# Patient Record
Sex: Female | Born: 1960
Health system: Southern US, Community
[De-identification: ages and names within clinical notes are randomized; demographics above are authoritative.]

## PROBLEM LIST (undated history)

## (undated) DIAGNOSIS — B379 Candidiasis, unspecified: Secondary | ICD-10-CM

## (undated) DIAGNOSIS — J42 Unspecified chronic bronchitis: Secondary | ICD-10-CM

## (undated) DIAGNOSIS — K219 Gastro-esophageal reflux disease without esophagitis: Secondary | ICD-10-CM

## (undated) DIAGNOSIS — R42 Dizziness and giddiness: Secondary | ICD-10-CM

## (undated) DIAGNOSIS — R7989 Other specified abnormal findings of blood chemistry: Secondary | ICD-10-CM

## (undated) DIAGNOSIS — E669 Obesity, unspecified: Secondary | ICD-10-CM

## (undated) DIAGNOSIS — K8020 Calculus of gallbladder without cholecystitis without obstruction: Secondary | ICD-10-CM

## (undated) DIAGNOSIS — E119 Type 2 diabetes mellitus without complications: Secondary | ICD-10-CM

## (undated) DIAGNOSIS — C801 Malignant (primary) neoplasm, unspecified: Secondary | ICD-10-CM

## (undated) DIAGNOSIS — R109 Unspecified abdominal pain: Secondary | ICD-10-CM

## (undated) DIAGNOSIS — N3941 Urge incontinence: Secondary | ICD-10-CM

## (undated) DIAGNOSIS — N840 Polyp of corpus uteri: Secondary | ICD-10-CM

## (undated) DIAGNOSIS — F419 Anxiety disorder, unspecified: Secondary | ICD-10-CM

## (undated) DIAGNOSIS — R61 Generalized hyperhidrosis: Secondary | ICD-10-CM

## (undated) DIAGNOSIS — Z79890 Hormone replacement therapy (postmenopausal): Secondary | ICD-10-CM

## (undated) DIAGNOSIS — A64 Unspecified sexually transmitted disease: Secondary | ICD-10-CM

## (undated) DIAGNOSIS — I10 Essential (primary) hypertension: Secondary | ICD-10-CM

## (undated) HISTORY — DX: Polyp of corpus uteri: N84.0

## (undated) HISTORY — DX: Anxiety disorder, unspecified: F41.9

## (undated) HISTORY — PX: MOLE REMOVAL: SHX2046

## (undated) HISTORY — DX: Generalized hyperhidrosis: R61

## (undated) HISTORY — PX: FOOT SURGERY: SHX648

## (undated) HISTORY — DX: Unspecified sexually transmitted disease: A64

## (undated) HISTORY — DX: Essential (primary) hypertension: I10

## (undated) HISTORY — DX: Other specified abnormal findings of blood chemistry: R79.89

## (undated) HISTORY — DX: Obesity, unspecified: E66.9

## (undated) HISTORY — PX: WISDOM TOOTH EXTRACTION: SHX21

## (undated) HISTORY — PX: CHOLECYSTECTOMY: SHX55

## (undated) HISTORY — DX: Unspecified abdominal pain: R10.9

## (undated) HISTORY — DX: Unspecified chronic bronchitis: J42

## (undated) HISTORY — DX: Calculus of gallbladder without cholecystitis without obstruction: K80.20

## (undated) HISTORY — PX: COLONOSCOPY: SHX174

## (undated) HISTORY — DX: Hormone replacement therapy: Z79.890

## (undated) HISTORY — DX: Candidiasis, unspecified: B37.9

## (undated) HISTORY — DX: Dizziness and giddiness: R42

## (undated) HISTORY — DX: Urge incontinence: N39.41

## (undated) HISTORY — PX: ABDOMINAL HYSTERECTOMY: SHX81

---

## 2004-12-08 ENCOUNTER — Ambulatory Visit (HOSPITAL_COMMUNITY): Admission: RE | Admit: 2004-12-08 | Discharge: 2004-12-08 | Payer: Self-pay | Admitting: Family Medicine

## 2007-10-25 ENCOUNTER — Other Ambulatory Visit: Admission: RE | Admit: 2007-10-25 | Discharge: 2007-10-25 | Payer: Self-pay | Admitting: Obstetrics and Gynecology

## 2008-12-04 ENCOUNTER — Other Ambulatory Visit: Admission: RE | Admit: 2008-12-04 | Discharge: 2008-12-04 | Payer: Self-pay | Admitting: Obstetrics and Gynecology

## 2010-03-24 ENCOUNTER — Other Ambulatory Visit: Admission: RE | Admit: 2010-03-24 | Discharge: 2010-03-24 | Payer: Self-pay | Admitting: Obstetrics and Gynecology

## 2011-08-10 ENCOUNTER — Other Ambulatory Visit (HOSPITAL_COMMUNITY)
Admission: RE | Admit: 2011-08-10 | Discharge: 2011-08-10 | Disposition: A | Discharge status: Home / Self Care | Payer: Federal, State, Local not specified - PPO | Source: Ambulatory Visit | Attending: Obstetrics & Gynecology | Admitting: Obstetrics & Gynecology

## 2011-08-10 DIAGNOSIS — Z113 Encounter for screening for infections with a predominantly sexual mode of transmission: Secondary | ICD-10-CM | POA: Insufficient documentation

## 2011-08-10 DIAGNOSIS — Z01419 Encounter for gynecological examination (general) (routine) without abnormal findings: Secondary | ICD-10-CM | POA: Insufficient documentation

## 2012-07-20 ENCOUNTER — Other Ambulatory Visit: Payer: Self-pay | Admitting: Women's Health

## 2012-08-15 ENCOUNTER — Other Ambulatory Visit (HOSPITAL_COMMUNITY)
Admission: RE | Admit: 2012-08-15 | Discharge: 2012-08-15 | Disposition: A | Discharge status: Home / Self Care | Payer: Federal, State, Local not specified - PPO | Source: Ambulatory Visit | Attending: Obstetrics and Gynecology | Admitting: Obstetrics and Gynecology

## 2012-08-15 DIAGNOSIS — Z1151 Encounter for screening for human papillomavirus (HPV): Secondary | ICD-10-CM | POA: Insufficient documentation

## 2012-08-15 DIAGNOSIS — Z01419 Encounter for gynecological examination (general) (routine) without abnormal findings: Secondary | ICD-10-CM | POA: Insufficient documentation

## 2013-03-25 ENCOUNTER — Telehealth: Payer: Self-pay | Admitting: Adult Health

## 2013-03-25 NOTE — Telephone Encounter (Signed)
Check labs fasting cbc,cmp,tsh lipids and fsh,complains of night sweats,cold, tired,not depressed per pt

## 2013-03-27 ENCOUNTER — Other Ambulatory Visit (INDEPENDENT_AMBULATORY_CARE_PROVIDER_SITE_OTHER): Payer: Federal, State, Local not specified - PPO

## 2013-03-27 DIAGNOSIS — N951 Menopausal and female climacteric states: Secondary | ICD-10-CM

## 2013-03-27 LAB — LIPID PANEL
Cholesterol: 163 mg/dL (ref 0–200)
LDL Cholesterol: 91 mg/dL (ref 0–99)
Total CHOL/HDL Ratio: 3.1 Ratio
VLDL: 20 mg/dL (ref 0–40)

## 2013-03-27 LAB — CBC
MCH: 32.9 pg (ref 26.0–34.0)
MCHC: 34.6 g/dL (ref 30.0–36.0)
Platelets: 306 10*3/uL (ref 150–400)
RBC: 4.29 MIL/uL (ref 3.87–5.11)
WBC: 7.5 10*3/uL (ref 4.0–10.5)

## 2013-03-27 LAB — COMPREHENSIVE METABOLIC PANEL
ALT: 13 U/L (ref 0–35)
AST: 13 U/L (ref 0–37)
CO2: 25 mEq/L (ref 19–32)
Chloride: 106 mEq/L (ref 96–112)
Creat: 0.83 mg/dL (ref 0.50–1.10)
Potassium: 4.3 mEq/L (ref 3.5–5.3)
Sodium: 138 mEq/L (ref 135–145)
Total Protein: 7.1 g/dL (ref 6.0–8.3)

## 2013-03-28 ENCOUNTER — Telehealth: Payer: Self-pay | Admitting: *Deleted

## 2013-03-28 NOTE — Telephone Encounter (Signed)
Pt requesting lab results from 03/27/2013, informed of all results CMP, TSH, CBC, lipid pain, FSH. Pt states Victorino Dike had mention pt being referred to Endocrinologist for progesterone levels would like to be referred to [facility]. Requesting Victorino Dike to give her a call back.

## 2013-03-29 ENCOUNTER — Telehealth: Payer: Self-pay | Admitting: Adult Health

## 2013-03-29 NOTE — Telephone Encounter (Signed)
Pt aware of labs and with her symptoms I would add estrogen to her progesterone but research it and we will talk next week

## 2013-03-29 NOTE — Telephone Encounter (Signed)
Pt to review estrogen therapy and call monday

## 2013-04-01 ENCOUNTER — Telehealth: Payer: Self-pay | Admitting: Adult Health

## 2013-04-02 ENCOUNTER — Telehealth: Payer: Self-pay | Admitting: Adult Health

## 2013-04-02 NOTE — Telephone Encounter (Signed)
Spoke with pt. Caroline Wong wants her to start Estrogen. Pt has done some research and would like this in a nasal spray if possible. If she can't have it in a nasal spray, she requests a patch. She don't want a pill. Uses [facility] in [place]. Please call pt. Thanks!!!

## 2013-04-02 NOTE — Telephone Encounter (Signed)
Will add estrogen patch come get sample

## 2013-04-15 ENCOUNTER — Telehealth: Payer: Self-pay | Admitting: Adult Health

## 2013-04-15 NOTE — Telephone Encounter (Signed)
Complains of weight gain and breast tenderness since starting estrogen, reassured. Give it a few weeks

## 2013-04-23 ENCOUNTER — Other Ambulatory Visit: Payer: Self-pay | Admitting: Adult Health

## 2013-04-23 MED ORDER — ESTRADIOL-NORETHINDRONE ACET 0.05-0.14 MG/DAY TD PTTW: 1.0000 | MEDICATED_PATCH | TRANSDERMAL | Status: DC

## 2013-04-23 NOTE — Telephone Encounter (Signed)
minivelle 0.1mg  patch   Progesterone and estrogen in same patch? If so she won't reorder progesterone. If you want to go ahead and give it that's fine.

## 2013-05-04 ENCOUNTER — Ambulatory Visit: Payer: Self-pay | Admitting: Physician Assistant

## 2013-05-16 ENCOUNTER — Ambulatory Visit (INDEPENDENT_AMBULATORY_CARE_PROVIDER_SITE_OTHER): Payer: Federal, State, Local not specified - PPO | Admitting: Internal Medicine

## 2013-05-16 ENCOUNTER — Encounter: Payer: Self-pay | Admitting: Internal Medicine

## 2013-05-16 VITALS — BP 136/90 | HR 75 | Temp 98.5°F | Ht 67.0 in | Wt 203.5 lb

## 2013-05-16 DIAGNOSIS — Z0000 Encounter for general adult medical examination without abnormal findings: Secondary | ICD-10-CM | POA: Insufficient documentation

## 2013-05-16 DIAGNOSIS — J42 Unspecified chronic bronchitis: Secondary | ICD-10-CM

## 2013-05-16 DIAGNOSIS — I10 Essential (primary) hypertension: Secondary | ICD-10-CM | POA: Insufficient documentation

## 2013-05-16 DIAGNOSIS — J019 Acute sinusitis, unspecified: Secondary | ICD-10-CM | POA: Insufficient documentation

## 2013-05-16 MED ORDER — HYDROCODONE-HOMATROPINE 5-1.5 MG/5ML PO SYRP: 5.0000 mL | ORAL_SOLUTION | Freq: Four times a day (QID) | ORAL | Status: DC | PRN

## 2013-05-16 MED ORDER — LEVOFLOXACIN 500 MG PO TABS: 500.0000 mg | ORAL_TABLET | Freq: Every day | ORAL | Status: DC

## 2013-05-16 NOTE — Progress Notes (Signed)
Pre-visit discussion using our clinic review tool. No additional management support is needed unless otherwise documented below in the visit note.  

## 2013-05-16 NOTE — Assessment & Plan Note (Signed)
Mild to mod, for antibx course,  to f/u any worsening symptoms or concerns 

## 2013-05-16 NOTE — Assessment & Plan Note (Signed)
stable overall by history and exam, recent data reviewed with pt, and pt to continue medical treatment as before,  to f/u any worsening symptoms or concerns BP Readings from Last 3 Encounters:  05/16/13 136/90

## 2013-05-16 NOTE — Patient Instructions (Signed)
Please take all new medication as prescribed Please continue all other medications as before Please have the pharmacy call with any other refills you may need.  Please keep your appointments with your specialists as you have planned  -  GYN  Please return in 6 months, or sooner if needed, with Lab testing done 3-5 days before

## 2013-05-16 NOTE — Progress Notes (Signed)
   Subjective:    Patient ID: Caroline Wong, female    DOB: 03/14/1961, 52 y.o.   MRN: [id number]  HPI Here as new pt to establish,  Here with 2-3 days acute onset fever, facial pain, pressure, headache, general weakness and malaise, and greenish d/c, with mild ST and cough, but pt denies chest pain, wheezing, increased sob or doe, orthopnea, PND, increased LE swelling, palpitations, dizziness or syncope.  Pt denies polydipsia, polyuria. Pt denies new neurological symptoms such as new headache, or facial or extremity weakness or numbness Past Medical History  Diagnosis Date  . Chronic bronchitis   . Hypertension    No past surgical history on file.  reports that she has never smoked. She does not have any smokeless tobacco history on file. She reports that she does not drink alcohol or use illicit drugs. family history includes Alcohol abuse in her other; Arthritis in her other; Cancer in her other and other; Diabetes in her other and other; Heart disease in her other and other; Hyperlipidemia in her other; Hypertension in her other and other; Stroke in her other. Allergies  Allergen Reactions  . Biaxin [Clarithromycin] Swelling  . Keflex [Cephalexin] Rash   Current Outpatient Prescriptions on File Prior to Visit  Medication Sig Dispense Refill  . estradiol-norethindrone (COMBIPATCH) 0.05-0.14 MG/DAY Place 1 patch onto the skin 2 (two) times a week.  8 patch  12   No current facility-administered medications on file prior to visit.   Review of Systems  Constitutional: Negative for unexpected weight change, or unusual diaphoresis  HENT: Negative for tinnitus.   Eyes: Negative for photophobia and visual disturbance.  Respiratory: Negative for choking and stridor.   Gastrointestinal: Negative for vomiting and blood in stool.  Genitourinary: Negative for hematuria and decreased urine volume.  Musculoskeletal: Negative for acute joint swelling Skin: Negative for color change and wound.    Neurological: Negative for tremors and numbness other than noted  Psychiatric/Behavioral: Negative for decreased concentration or  hyperactivity.       Objective:   Physical Exam VS noted, mild ill Constitutional: Pt appears well-developed and well-nourished.  HENT: Head: NCAT.  Right Ear: External ear normal.  Left Ear: External ear normal.  Eyes: Conjunctivae and EOM are normal. Pupils are equal, round, and reactive to light.  Bilat tm's with mild erythema.  Max sinus areas mild tender.  Pharynx with mild erythema, no exudate Neck: Normal range of motion. Neck supple.  Cardiovascular: Normal rate and regular rhythm.   Pulmonary/Chest: Effort normal and breath sounds normal.  Abd:  Soft, NT, non-distended, + BS Neurological: Pt is alert. Not confused  Skin: Skin is warm. No erythema.  Psychiatric: Pt behavior is normal. Thought content normal.         Assessment & Plan:

## 2013-05-16 NOTE — Assessment & Plan Note (Signed)
Mild to mod,,  to f/u any worsening symptoms or concerns

## 2013-05-17 ENCOUNTER — Ambulatory Visit: Payer: Federal, State, Local not specified - PPO | Admitting: Internal Medicine

## 2013-05-17 ENCOUNTER — Other Ambulatory Visit: Payer: Self-pay

## 2013-05-17 MED ORDER — LEVOFLOXACIN 500 MG PO TABS: 500.0000 mg | ORAL_TABLET | Freq: Every day | ORAL | Status: DC

## 2013-05-22 ENCOUNTER — Ambulatory Visit: Payer: Federal, State, Local not specified - PPO | Admitting: Internal Medicine

## 2013-06-06 HISTORY — PX: COLONOSCOPY: SHX174

## 2013-07-15 LAB — HM MAMMOGRAPHY

## 2013-08-13 ENCOUNTER — Other Ambulatory Visit: Payer: Self-pay | Admitting: Women's Health

## 2013-08-14 ENCOUNTER — Telehealth: Payer: Self-pay

## 2013-08-14 MED ORDER — AZITHROMYCIN 250 MG PO TABS: ORAL_TABLET | ORAL | Status: DC

## 2013-08-14 NOTE — Telephone Encounter (Signed)
The patient thinks she may have bronchitis.  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  She is asking this one time if a zpack can be called in to [facility] Dr. Linna Hoff.  Advise please, number to call back [phone]

## 2013-08-14 NOTE — Telephone Encounter (Signed)
Patient informed. 

## 2013-08-14 NOTE — Telephone Encounter (Signed)
Ok this time only  Needs ov if not improved

## 2013-08-16 ENCOUNTER — Other Ambulatory Visit: Payer: Self-pay | Admitting: Adult Health

## 2013-08-21 ENCOUNTER — Ambulatory Visit (INDEPENDENT_AMBULATORY_CARE_PROVIDER_SITE_OTHER): Payer: Federal, State, Local not specified - PPO | Admitting: Adult Health

## 2013-08-21 ENCOUNTER — Encounter (INDEPENDENT_AMBULATORY_CARE_PROVIDER_SITE_OTHER): Payer: Self-pay

## 2013-08-21 ENCOUNTER — Encounter: Payer: Self-pay | Admitting: Adult Health

## 2013-08-21 VITALS — BP 150/98 | HR 76 | Ht 68.0 in | Wt 209.0 lb

## 2013-08-21 DIAGNOSIS — Z79890 Hormone replacement therapy (postmenopausal): Secondary | ICD-10-CM

## 2013-08-21 DIAGNOSIS — Z01419 Encounter for gynecological examination (general) (routine) without abnormal findings: Secondary | ICD-10-CM

## 2013-08-21 DIAGNOSIS — Z1212 Encounter for screening for malignant neoplasm of rectum: Secondary | ICD-10-CM

## 2013-08-21 LAB — HEMOCCULT GUIAC POC 1CARD (OFFICE): Fecal Occult Blood, POC: NEGATIVE

## 2013-08-21 MED ORDER — VALACYCLOVIR HCL 1 G PO TABS: ORAL_TABLET | ORAL | Status: DC

## 2013-08-21 NOTE — Progress Notes (Signed)
Patient ID: Caroline Wong, female   DOB: [birth year], 53 y.o.   MRN: [id number] History of Present Illness:  Caroline Wong is a 53 year old white female, married in for a physical.She had a normal pap with negative HPV 08/15/12.She has had bronchitis recently.she is using combi patch and likes it but has some itching at application site.  Current Medications, Allergies, Past Medical History, Past Surgical History, Family History and Social History were reviewed in [employer] record.   Past Medical History  Diagnosis Date  . Chronic bronchitis   . Hypertension   . Hormone replacement therapy (HRT)    Past Surgical History  Procedure Laterality Date  . Wisdom tooth extraction    . Mole removal    Current outpatient prescriptions:amLODipine-benazepril (LOTREL) 5-20 MG per capsule, Take 1 capsule by mouth daily. , Disp: , Rfl: ;  b complex vitamins tablet, Take 1 tablet by mouth daily., Disp: , Rfl: ;  estradiol-norethindrone (COMBIPATCH) 0.05-0.14 MG/DAY, Place 1 patch onto the skin 2 (two) times a week., Disp: 8 patch, Rfl: 12;  LORazepam (ATIVAN) 0.5 MG tablet, Take 0.5 mg by mouth every 8 (eight) hours., Disp: , Rfl:  metoprolol succinate (TOPROL-XL) 25 MG 24 hr tablet, , Disp: , Rfl: ;  NASONEX 50 MCG/ACT nasal spray, Place 2 sprays into the nose daily. , Disp: , Rfl: ;  Probiotic Product (PROBIOTIC DAILY PO), Take by mouth daily., Disp: , Rfl: ;  valACYclovir (VALTREX) 1000 MG tablet, take 1 tablet by mouth once daily, Disp: 30 tablet, Rfl: PRN  Review of Systems: Patient denies any headaches, blurred vision, shortness of breath, chest pain, abdominal pain, problems with bowel movements, urination, or intercourse. No joint pain or mood swings,see HPI.    Physical Exam:BP 150/98  Pulse 76  Ht 5\' 8"  (1.727 m)  Wt 209 lb (94.802 kg)  BMI 31.79 kg/m2  LMP 08/06/2013 General:  Well developed, well nourished, no acute distress Skin:  Warm and dry Neck:  Midline trachea,  normal thyroid Lungs; Clear to auscultation bilaterally Breast:  No dominant palpable mass, retraction, or nipple discharge Cardiovascular: Regular rate and rhythm Abdomen:  Soft, non tender, no hepatosplenomegaly Pelvic:  External genitalia is normal in appearance.  The vagina is normal in appearance. The cervix is bulbous.  Uterus is felt to be normal size, shape, and contour.  No adnexal masses or tenderness noted. Rectal: Good sphincter tone, no polyps, or hemorrhoids felt.  Hemoccult negative. Extremities:  No swelling or varicosities noted Psych:  No mood changes, alert and cooperative, seems happy Try wiping spot with alcohol and let dry before putting patch on.  Impression: Yearly gyn exam no pap HRT    Plan: Physical in 1 year Pap in 2017 Mammogram yearly  Labs with PCP Colonoscopy advised, will get PCP to refer Refilled combipatch x 1 year Refilled valtrex x 1 year

## 2013-08-21 NOTE — Patient Instructions (Signed)

## 2013-09-09 ENCOUNTER — Telehealth: Payer: Self-pay | Admitting: Adult Health

## 2013-09-09 MED ORDER — PROGESTERONE MICRONIZED 200 MG PO CAPS
ORAL_CAPSULE | ORAL | Status: DC
Start: 2013-09-09 — End: 2014-08-29

## 2013-09-09 MED ORDER — ESTRADIOL 0.52 MG/0.87 GM (0.06%) TD GEL: 1.0000 "application " | Freq: Every day | TRANSDERMAL | Status: DC

## 2013-09-09 NOTE — Telephone Encounter (Signed)
Had BTB with patch and it is irritating and not sticking well will change to Prometrium and elestrin gel

## 2013-09-11 ENCOUNTER — Encounter: Payer: Self-pay | Admitting: Adult Health

## 2013-09-18 ENCOUNTER — Ambulatory Visit: Payer: Federal, State, Local not specified - PPO | Admitting: Podiatry

## 2013-09-18 ENCOUNTER — Ambulatory Visit (INDEPENDENT_AMBULATORY_CARE_PROVIDER_SITE_OTHER): Payer: Federal, State, Local not specified - PPO

## 2013-09-18 ENCOUNTER — Ambulatory Visit (INDEPENDENT_AMBULATORY_CARE_PROVIDER_SITE_OTHER): Payer: Federal, State, Local not specified - PPO | Admitting: Podiatry

## 2013-09-18 ENCOUNTER — Encounter: Payer: Self-pay | Admitting: Podiatry

## 2013-09-18 DIAGNOSIS — R52 Pain, unspecified: Secondary | ICD-10-CM

## 2013-09-18 DIAGNOSIS — D492 Neoplasm of unspecified behavior of bone, soft tissue, and skin: Secondary | ICD-10-CM

## 2013-09-18 DIAGNOSIS — M722 Plantar fascial fibromatosis: Secondary | ICD-10-CM

## 2013-09-18 MED ORDER — TRIAMCINOLONE ACETONIDE 10 MG/ML IJ SUSP
10.0000 mg | Freq: Once | INTRAMUSCULAR | Status: AC
  Administered 2013-09-18: 10 mg

## 2013-09-18 NOTE — Progress Notes (Signed)
Subjective:     Patient ID: Caroline Wong, female   DOB: 10/10/1960, 53 y.o.   MRN: [id number]  Foot Pain   patient states the bottom of my feet it did really sore or while have worsened over the last 3 weeks with nodules that I've noted plantar aspect left where the pain is present. Does not remember specific injury and presents with husband   Review of Systems  All other systems reviewed and are negative.      Objective:   Physical Exam  Nursing note and vitals reviewed. Constitutional: She is oriented to person, place, and time.  Cardiovascular: Intact distal pulses.   Musculoskeletal: Normal range of motion.  Neurological: She is oriented to person, place, and time.  Skin: Skin is warm.   neurovascular status intact with health history unchanged and normal muscle strength and normal range of motion the subtalar midtarsal joint. Moderate forefoot pain in both feet and significant discomfort in the left arch where there is approximately 1 cm x 1 cm palpable mass which appears to be within the plantar fascia. Small mass also noted in the right foot same area     Assessment:     Probable plantar fibroma or possible cyst formation of an acute nature with plantar fasciitis    Plan:     H&P and x-ray reviewed and today I did careful injection around the area 3 mm Kenalog 5 of abduction Marcaine mixture to try to reduce the inflammation. Dispense fascial brace to lift the arch and instructed on physical therapy and reappoint one week and discuss long-term orthotics treatment for this patient

## 2013-09-18 NOTE — Patient Instructions (Signed)

## 2013-09-18 NOTE — Progress Notes (Signed)
   Subjective:    Patient ID: Caroline Wong, female    DOB: May 15, 1961, 53 y.o.   MRN: [id number]  HPI PT STATED BOTH FEET HAVING BURNING SENSATION AND SORE FOR 3 WEEKS. THE FEET ARE GETTING WORSE. THE FEET GET AGGRAVATED BY WALKING AND PRESSURE. TRIED TO USED INSERT BUT THE PAIN IS GETTING WORSE.    Review of Systems  All other systems reviewed and are negative.      Objective:   Physical Exam        Assessment & Plan:

## 2013-09-26 ENCOUNTER — Encounter: Payer: Self-pay | Admitting: Podiatry

## 2013-09-26 ENCOUNTER — Ambulatory Visit (INDEPENDENT_AMBULATORY_CARE_PROVIDER_SITE_OTHER): Payer: Federal, State, Local not specified - PPO | Admitting: Podiatry

## 2013-09-26 VITALS — BP 120/75 | HR 68 | Resp 18

## 2013-09-26 DIAGNOSIS — M722 Plantar fascial fibromatosis: Secondary | ICD-10-CM

## 2013-09-26 NOTE — Progress Notes (Signed)
Subjective:     Patient ID: Caroline Wong, female   DOB: 1960-07-21, 53 y.o.   MRN: [id number]  HPI patient states my feet are improved over where they were I think the nodules are smaller and the pain has receded. I am on cement floors and I still get pain with activity   Review of Systems     Objective:   Physical Exam Neurovascular status intact with diminishment of what appeared to be plantar fibroma or cyst in the mid arch area both feet. They are smaller in origin about 5 x 5 mm and there is mild to moderate discomfort within the fascially band itself    Assessment:     Appears to be improving but does have chronic fasciitis-like symptoms    Plan:     I advised her on continued stretching activities and I did dispense a night splint with instructions on heat and ice therapy. I then scanned for custom orthotics to reduce stress against her arch as secondary to her intense activity levels and the fact she's on cement floors all times reappoint when ready

## 2013-10-14 ENCOUNTER — Ambulatory Visit (INDEPENDENT_AMBULATORY_CARE_PROVIDER_SITE_OTHER): Payer: Federal, State, Local not specified - PPO | Admitting: Podiatry

## 2013-10-14 ENCOUNTER — Encounter: Payer: Self-pay | Admitting: Podiatry

## 2013-10-14 VITALS — BP 121/78 | HR 86 | Resp 12

## 2013-10-14 DIAGNOSIS — M722 Plantar fascial fibromatosis: Secondary | ICD-10-CM

## 2013-10-14 MED ORDER — TRIAMCINOLONE ACETONIDE 10 MG/ML IJ SUSP
10.0000 mg | Freq: Once | INTRAMUSCULAR | Status: AC
  Administered 2013-10-14: 10 mg

## 2013-10-14 NOTE — Progress Notes (Signed)
   Subjective:    Patient ID: Caroline Wong, female    DOB: [birth year], 53 y.o.   MRN: [id number]  HPI PICK UP ORTHOTICS AND GIVEN INSTRUCTION.    Review of Systems     Objective:   Physical Exam        Assessment & Plan:

## 2013-10-14 NOTE — Patient Instructions (Signed)

## 2013-10-15 NOTE — Progress Notes (Signed)
Subjective:     Patient ID: Caroline Wong, female   DOB: 02/18/1961, 53 y.o.   MRN: [id number]  HPI patient states that the masses have shrunk in the arch but they are still very tender and she presents to pick up orthotics   Review of Systems     Objective:   Physical Exam Neurovascular status is intact with muscle strength adequate and no equinus condition noted. Patient is found to have discomfort in the mid arch area both feet with significant diminishment of any growth in the area    Assessment:     Improved fasciitis of both feet currently with improvement of what appears to be some form of cyst like lesions    Plan:     Reinjected the plantar fascia of both feet and advised on physical therapy and the beginning of orthotic usage currently

## 2013-10-30 ENCOUNTER — Other Ambulatory Visit (INDEPENDENT_AMBULATORY_CARE_PROVIDER_SITE_OTHER): Payer: Federal, State, Local not specified - PPO

## 2013-10-30 DIAGNOSIS — Z0000 Encounter for general adult medical examination without abnormal findings: Secondary | ICD-10-CM

## 2013-10-30 LAB — CBC WITH DIFFERENTIAL/PLATELET
BASOS PCT: 0.8 % (ref 0.0–3.0)
Basophils Absolute: 0.1 10*3/uL (ref 0.0–0.1)
EOS PCT: 1.8 % (ref 0.0–5.0)
Eosinophils Absolute: 0.1 10*3/uL (ref 0.0–0.7)
HCT: 43 % (ref 36.0–46.0)
HEMOGLOBIN: 14.5 g/dL (ref 12.0–15.0)
Lymphocytes Relative: 26.7 % (ref 12.0–46.0)
Lymphs Abs: 2.1 10*3/uL (ref 0.7–4.0)
MCHC: 33.7 g/dL (ref 30.0–36.0)
MCV: 96.5 fl (ref 78.0–100.0)
MONOS PCT: 8.4 % (ref 3.0–12.0)
Monocytes Absolute: 0.6 10*3/uL (ref 0.1–1.0)
NEUTROS ABS: 4.8 10*3/uL (ref 1.4–7.7)
NEUTROS PCT: 62.3 % (ref 43.0–77.0)
Platelets: 317 10*3/uL (ref 150.0–400.0)
RBC: 4.46 Mil/uL (ref 3.87–5.11)
RDW: 13 % (ref 11.5–15.5)
WBC: 7.7 10*3/uL (ref 4.0–10.5)

## 2013-10-30 LAB — URINALYSIS, ROUTINE W REFLEX MICROSCOPIC
Bilirubin Urine: NEGATIVE
KETONES UR: NEGATIVE
Nitrite: NEGATIVE
PH: 6 (ref 5.0–8.0)
Specific Gravity, Urine: 1.01 (ref 1.000–1.030)
Total Protein, Urine: NEGATIVE
URINE GLUCOSE: NEGATIVE
UROBILINOGEN UA: 0.2 (ref 0.0–1.0)

## 2013-10-30 LAB — BASIC METABOLIC PANEL
BUN: 19 mg/dL (ref 6–23)
CO2: 26 meq/L (ref 19–32)
Calcium: 9.1 mg/dL (ref 8.4–10.5)
Chloride: 101 mEq/L (ref 96–112)
Creatinine, Ser: 1 mg/dL (ref 0.4–1.2)
GFR: 65.24 mL/min (ref >60.00)
GLUCOSE: 98 mg/dL (ref 70–99)
POTASSIUM: 4 meq/L (ref 3.5–5.1)
SODIUM: 134 meq/L — AB (ref 135–145)

## 2013-10-30 LAB — LIPID PANEL
Cholesterol: 183 mg/dL (ref 0–200)
HDL: 57.1 mg/dL (ref >39.00)
LDL CALC: 107 mg/dL — AB (ref 0–99)
TRIGLYCERIDES: 93 mg/dL (ref 0.0–149.0)
Total CHOL/HDL Ratio: 3
VLDL: 18.6 mg/dL (ref 0.0–40.0)

## 2013-10-30 LAB — HEPATIC FUNCTION PANEL
ALT: 26 U/L (ref 0–35)
AST: 19 U/L (ref 0–37)
Albumin: 3.8 g/dL (ref 3.5–5.2)
Alkaline Phosphatase: 70 U/L (ref 39–117)
BILIRUBIN DIRECT: 0.1 mg/dL (ref 0.0–0.3)
BILIRUBIN TOTAL: 0.8 mg/dL (ref 0.2–1.2)
TOTAL PROTEIN: 7.6 g/dL (ref 6.0–8.3)

## 2013-10-30 LAB — TSH: TSH: 0.88 u[IU]/mL (ref 0.35–4.50)

## 2013-10-31 ENCOUNTER — Encounter: Payer: Self-pay | Admitting: Internal Medicine

## 2013-11-11 ENCOUNTER — Ambulatory Visit: Payer: Federal, State, Local not specified - PPO | Admitting: Podiatry

## 2013-11-20 ENCOUNTER — Ambulatory Visit: Payer: Federal, State, Local not specified - PPO | Admitting: Internal Medicine

## 2013-11-25 ENCOUNTER — Encounter: Payer: Self-pay | Admitting: Adult Health

## 2013-11-25 ENCOUNTER — Ambulatory Visit (INDEPENDENT_AMBULATORY_CARE_PROVIDER_SITE_OTHER): Payer: Federal, State, Local not specified - PPO | Admitting: Adult Health

## 2013-11-25 VITALS — BP 140/100 | Ht 69.0 in | Wt 214.0 lb

## 2013-11-25 DIAGNOSIS — R1011 Right upper quadrant pain: Secondary | ICD-10-CM

## 2013-11-25 DIAGNOSIS — R109 Unspecified abdominal pain: Secondary | ICD-10-CM | POA: Insufficient documentation

## 2013-11-25 HISTORY — DX: Unspecified abdominal pain: R10.9

## 2013-11-25 NOTE — Patient Instructions (Signed)
Abdominal Pain Many things can cause abdominal pain. Usually, abdominal pain is not caused by a disease and will improve without treatment. It can often be observed and treated at home. Your health care provider will do a physical exam and possibly order blood tests and X-rays to help determine the seriousness of your pain. However, in many cases, more time must pass before a clear cause of the pain can be found. Before that point, your health care provider may not know if you need more testing or further treatment. HOME CARE INSTRUCTIONS  Monitor your abdominal pain for any changes. The following actions may help to alleviate any discomfort you are experiencing:  Only take over-the-counter or prescription medicines as directed by your health care provider.  Do not take laxatives unless directed to do so by your health care provider.  Try a clear liquid diet (broth, tea, or water) as directed by your health care provider. Slowly move to a bland diet as tolerated. SEEK MEDICAL CARE IF:  You have unexplained abdominal pain.  You have abdominal pain associated with nausea or diarrhea.  You have pain when you urinate or have a bowel movement.  You experience abdominal pain that wakes you in the night.  You have abdominal pain that is worsened or improved by eating food.  You have abdominal pain that is worsened with eating fatty foods.  You have a fever. SEEK IMMEDIATE MEDICAL CARE IF:   Your pain does not go away within 2 hours.  You keep throwing up (vomiting).  Your pain is felt only in portions of the abdomen, such as the right side or the left lower portion of the abdomen.  You pass bloody or black tarry stools. MAKE SURE YOU:  Understand these instructions.   Will watch your condition.   Will get help right away if you are not doing well or get worse.  Document Released: 03/02/2005 Document Revised: 05/28/2013 Document Reviewed: 01/30/2013 [facility] Patient Information  2015 [place], [place]. This information is not intended to replace advice given to you by your health care provider. Make sure you discuss any questions you have with your health care provider. [place] 6/23 at 12N at [facility]

## 2013-11-25 NOTE — Progress Notes (Signed)
Subjective:     Patient ID: Caroline Wong, female   DOB: 1960-10-27, 53 y.o.   MRN: [id number]  HPI Caroline Wong is a 53 year old white female, married in complaining of pain in right side with some nausea, can not relate to any food or activity, no vomiting .  Review of Systems See HPI Reviewed past medical,surgical, social and family history. Reviewed medications and allergies.      Objective:   Physical Exam BP 140/100  Ht 5\' 9"  (1.753 m)  Wt 214 lb (97.07 kg)  BMI 31.59 kg/m2  LMP 11/17/2013   skin warm and dry, abdomin soft has some tenderness RUQ near waist,No HSM noted.Pelvic: external genitalia is normal in appearance, vagina: has good color, moisture and some loss of rugae, cervix:smooth and bulbous,-CMT uterus: normal size, shape and contour, non tender, no masses felt, adnexa: no masses or tenderness noted.  Assessment:     Abdominal pain    Plan:     Abdominal and pelvic US 6/23 at 12 N at [facility] Will talk after results in  Review handout on abdominal pain

## 2013-11-26 ENCOUNTER — Encounter: Payer: Self-pay | Admitting: Internal Medicine

## 2013-11-26 ENCOUNTER — Ambulatory Visit (HOSPITAL_COMMUNITY)
Admission: RE | Admit: 2013-11-26 | Discharge: 2013-11-26 | Disposition: A | Discharge status: Home / Self Care | Payer: Federal, State, Local not specified - PPO | Source: Ambulatory Visit | Attending: Adult Health | Admitting: Adult Health

## 2013-11-26 ENCOUNTER — Ambulatory Visit (INDEPENDENT_AMBULATORY_CARE_PROVIDER_SITE_OTHER): Payer: Federal, State, Local not specified - PPO | Admitting: Internal Medicine

## 2013-11-26 ENCOUNTER — Telehealth: Payer: Self-pay | Admitting: *Deleted

## 2013-11-26 VITALS — BP 122/90 | HR 68 | Temp 98.4°F | Ht 69.0 in | Wt 213.2 lb

## 2013-11-26 DIAGNOSIS — R1011 Right upper quadrant pain: Secondary | ICD-10-CM

## 2013-11-26 DIAGNOSIS — R109 Unspecified abdominal pain: Secondary | ICD-10-CM

## 2013-11-26 DIAGNOSIS — Z0000 Encounter for general adult medical examination without abnormal findings: Secondary | ICD-10-CM

## 2013-11-26 DIAGNOSIS — I10 Essential (primary) hypertension: Secondary | ICD-10-CM

## 2013-11-26 DIAGNOSIS — K8020 Calculus of gallbladder without cholecystitis without obstruction: Secondary | ICD-10-CM | POA: Insufficient documentation

## 2013-11-26 DIAGNOSIS — N83209 Unspecified ovarian cyst, unspecified side: Secondary | ICD-10-CM | POA: Insufficient documentation

## 2013-11-26 MED ORDER — EPINEPHRINE 0.3 MG/0.3ML IJ SOAJ: 0.3000 mg | Freq: Once | INTRAMUSCULAR | Status: DC

## 2013-11-26 MED ORDER — AMLODIPINE BESY-BENAZEPRIL HCL 10-20 MG PO CAPS: 1.0000 | ORAL_CAPSULE | Freq: Every day | ORAL | Status: DC

## 2013-11-26 NOTE — Assessment & Plan Note (Signed)
Ok to increase the BP med to amlo/benaz 10/20

## 2013-11-26 NOTE — Progress Notes (Signed)
Subjective:    Patient ID: Caroline Wong, female    DOB: [birth year], 53 y.o.   MRN: [id number]  HPI Here for wellness and f/u;  Overall doing ok;  Pt denies CP, worsening SOB, DOE, wheezing, orthopnea, PND, worsening LE edema, palpitations, dizziness or syncope.  Pt denies neurological change such as new headache, facial or extremity weakness.  Pt denies polydipsia, polyuria, or low sugar symptoms. Pt states overall good compliance with treatment and medications, good tolerability, and has been trying to follow lower cholesterol diet.  Pt denies worsening depressive symptoms, suicidal ideation or panic. No fever, night sweats, wt loss, loss of appetite, or other constitutional symptoms.  Pt states good ability with ADL's, has low fall risk, home safety reviewed and adequate, no other significant changes in hearing or vision, and only occasionally active with exercise   Had abd u/s today - with gallstones, neg for inflammation. Husband with recent [place].  Due for colonsocpy.  No fever, pain seems to radiate to the right lower abd as well Past Medical History  Diagnosis Date  . Chronic bronchitis   . Hypertension   . Hormone replacement therapy (HRT)   . Abdominal pain 11/25/2013    Has some nausea associated with pain that has been on and off x 2 weeks will get [place]   Past Surgical History  Procedure Laterality Date  . Wisdom tooth extraction    . Mole removal      reports that she has never smoked. She has never used smokeless tobacco. She reports that she drinks alcohol. She reports that she does not use illicit drugs. family history includes Arthritis in her maternal grandfather, maternal grandmother, paternal grandfather, and paternal grandmother; Asthma in her maternal grandmother; Cancer in her mother, paternal grandfather, and paternal grandmother; Diabetes in her brother, maternal grandmother, mother, and paternal grandfather; Fibromyalgia in her mother; Heart disease in her father,  maternal grandfather, maternal grandmother, mother, paternal grandfather, and paternal grandmother; Hypertension in her brother, brother, and mother. Allergies  Allergen Reactions  . Biaxin [Clarithromycin] Swelling  . Keflex [Cephalexin] Rash   Current Outpatient Prescriptions on File Prior to Visit  Medication Sig Dispense Refill  . b complex vitamins tablet Take 1 tablet by mouth daily.      . COMBIPATCH 0.05-0.14 MG/DAY       . Estradiol 0.52 MG/0.87 GM (0.06%) GEL Apply 1 application topically daily.  26 g  11  . LORazepam (ATIVAN) 0.5 MG tablet Take 0.5 mg by mouth every 8 (eight) hours.      . metoprolol succinate (TOPROL-XL) 25 MG 24 hr tablet       . NASONEX 50 MCG/ACT nasal spray Place 2 sprays into the nose daily.       . Probiotic Product (PROBIOTIC DAILY PO) Take by mouth daily.      . progesterone (PROMETRIUM) 200 MG capsule Take 1 at HS  30 capsule  11  . valACYclovir (VALTREX) 1000 MG tablet take 1 tablet by mouth once daily  30 tablet  PRN   No current facility-administered medications on file prior to visit.    Review of Systems Constitutional: Negative for increased diaphoresis, other activity, appetite or other siginficant weight change  HENT: Negative for worsening hearing loss, ear pain, facial swelling, mouth sores and neck stiffness.   Eyes: Negative for other worsening pain, redness or visual disturbance.  Respiratory: Negative for shortness of breath and wheezing.   Cardiovascular: Negative for chest pain and palpitations.  Gastrointestinal:  Negative for diarrhea, blood in stool, abdominal distention or other pain Genitourinary: Negative for hematuria, flank pain or change in urine volume.  Musculoskeletal: Negative for myalgias or other joint complaints.  Skin: Negative for color change and wound.  Neurological: Negative for syncope and numbness. other than noted Hematological: Negative for adenopathy. or other swelling Psychiatric/Behavioral: Negative for  hallucinations, self-injury, decreased concentration or other worsening agitation.      Objective:   Physical Exam BP 122/90  Pulse 68  Temp(Src) 98.4 F (36.9 C) (Oral)  Ht 5\' 9"  (1.753 m)  Wt 213 lb 4 oz (96.73 kg)  BMI 31.48 kg/m2  SpO2 97%  LMP 11/17/2013' VS noted,  Constitutional: Pt is oriented to person, place, and time. Appears well-developed and well-nourished.  Head: Normocephalic and atraumatic.  Right Ear: External ear normal.  Left Ear: External ear normal.  Nose: Nose normal.  Mouth/Throat: Oropharynx is clear and moist.  Eyes: Conjunctivae and EOM are normal. Pupils are equal, round, and reactive to light.  Neck: Normal range of motion. Neck supple. No JVD present. No tracheal deviation present.  Cardiovascular: Normal rate, regular rhythm, normal heart sounds and intact distal pulses.   Pulmonary/Chest: Effort normal and breath sounds without rales or wheezing  Abdominal: Soft. Bowel sounds are normal. NT. No HSM but tender RUQ/RLQ, no guarding/rebound  Musculoskeletal: Normal range of motion. Exhibits no edema.  Lymphadenopathy:  Has no cervical adenopathy.  Neurological: Pt is alert and oriented to person, place, and time. Pt has normal reflexes. No cranial nerve deficit. Motor grossly intact Skin: Skin is warm and dry. No rash noted.  Psychiatric:  Has normal mood and affect. Behavior is normal.      Assessment & Plan:

## 2013-11-26 NOTE — Assessment & Plan Note (Signed)

## 2013-11-26 NOTE — Patient Instructions (Addendum)
OK to increase the amlod/benazepril to 10/20 mg per day  Please continue all other medications as before, and refills have been done if requested - the epipen  Please have the pharmacy call with any other refills you may need.  Please continue your efforts at being more active, low cholesterol diet, and weight control.  You are otherwise up to date with prevention measures today.  Please keep your appointments with your specialists as you may have planned  You will be contacted regarding the referral for: colonoscopy, HIDA scan  Please return in 1 year for your yearly visit, or sooner if needed, with Lab testing done 3-5 days before

## 2013-11-26 NOTE — Telephone Encounter (Signed)
Pt aware has simple right ovarian cyst and gallstones, decrease fatty spicy foods and she can consult with surgeon of her choice

## 2013-11-26 NOTE — Assessment & Plan Note (Addendum)
abd u/s neg for acute, pt with pain/tender, may 2015 lft's ok, pt asks for further testing - for HIDA scan, consider ct and/or GI referral, consider antibx for fever

## 2013-11-26 NOTE — Telephone Encounter (Signed)
Pt aware results are not ready yet. [place]

## 2013-11-26 NOTE — Progress Notes (Signed)
Pre visit review using our clinic review tool, if applicable. No additional management support is needed unless otherwise documented below in the visit note. 

## 2013-11-27 ENCOUNTER — Telehealth: Payer: Self-pay | Admitting: Adult Health

## 2013-11-27 NOTE — Telephone Encounter (Signed)
[place],  PCP ordered HIDA scan,

## 2013-11-27 NOTE — Telephone Encounter (Signed)
Spoke with pt. Pt is concerned about the right ovarian cyst she has. Pt wants to talk to you again. Thanks!!! [employer]

## 2013-11-28 ENCOUNTER — Other Ambulatory Visit: Payer: Self-pay | Admitting: Internal Medicine

## 2013-11-28 DIAGNOSIS — R1011 Right upper quadrant pain: Secondary | ICD-10-CM

## 2013-11-29 ENCOUNTER — Encounter: Payer: Self-pay | Admitting: Internal Medicine

## 2013-12-02 ENCOUNTER — Ambulatory Visit (HOSPITAL_COMMUNITY)
Admission: RE | Admit: 2013-12-02 | Discharge: 2013-12-02 | Disposition: A | Discharge status: Home / Self Care | Payer: Federal, State, Local not specified - PPO | Source: Ambulatory Visit | Attending: Internal Medicine | Admitting: Internal Medicine

## 2013-12-02 ENCOUNTER — Telehealth: Payer: Self-pay | Admitting: Adult Health

## 2013-12-02 DIAGNOSIS — K8020 Calculus of gallbladder without cholecystitis without obstruction: Secondary | ICD-10-CM | POA: Insufficient documentation

## 2013-12-02 DIAGNOSIS — R1011 Right upper quadrant pain: Secondary | ICD-10-CM | POA: Insufficient documentation

## 2013-12-02 DIAGNOSIS — R110 Nausea: Secondary | ICD-10-CM | POA: Insufficient documentation

## 2013-12-02 MED ORDER — TECHNETIUM TC 99M MEBROFENIN IV KIT
5.5000 | PACK | Freq: Once | INTRAVENOUS | Status: AC | PRN
  Administered 2013-12-02: 6 via INTRAVENOUS

## 2013-12-02 MED ORDER — SINCALIDE 5 MCG IJ SOLR
0.0200 ug/kg | Freq: Once | INTRAMUSCULAR | Status: AC
  Administered 2013-12-02: 1.9 ug via INTRAVENOUS

## 2013-12-02 NOTE — Telephone Encounter (Signed)
Tiney had HIDA scan today and has pain after procedure, the HIDA scan shows EF 74 % which is normal, will send ABD [place] to [facility] to Dr Rosendo Gros has appt 12/19/13.

## 2013-12-03 ENCOUNTER — Telehealth: Payer: Self-pay | Admitting: *Deleted

## 2013-12-03 NOTE — Telephone Encounter (Signed)
Notified pt with md response. Pt states the pain is harder and coming frequently. Would like referral to see GI...[name]

## 2013-12-03 NOTE — Telephone Encounter (Signed)
The tests showed normal GB function, so pain does not appear to be coming from obstruction of the GB; I can refer to GI if she wants

## 2013-12-03 NOTE — Telephone Encounter (Signed)
Pt called back wanting md to rx something for the pain until she is able to see GI...Caroline Wong

## 2013-12-03 NOTE — Telephone Encounter (Signed)
All I can say is tylenol for now, since I dont have a definite reason for the pain

## 2013-12-03 NOTE — Telephone Encounter (Signed)
Pt is requesting results back from her hida scan that was done...Caroline Wong

## 2013-12-04 ENCOUNTER — Telehealth: Payer: Self-pay | Admitting: Adult Health

## 2013-12-04 MED ORDER — PROMETHAZINE HCL 25 MG PO TABS: 25.0000 mg | ORAL_TABLET | Freq: Four times a day (QID) | ORAL | Status: DC | PRN

## 2013-12-04 MED ORDER — HYDROCODONE-ACETAMINOPHEN 5-325 MG PO TABS: 1.0000 | ORAL_TABLET | Freq: Four times a day (QID) | ORAL | Status: DC | PRN

## 2013-12-04 NOTE — Telephone Encounter (Signed)
Complains of pain with GB will rx norco and phenergan

## 2013-12-04 NOTE — Telephone Encounter (Signed)
Notified pt with md response.../lmb 

## 2013-12-05 ENCOUNTER — Telehealth: Payer: Self-pay | Admitting: Internal Medicine

## 2013-12-05 NOTE — Telephone Encounter (Signed)
Patient is calling to request that Dr. Jenny Reichmann put in a referral for GI. Please advise.

## 2013-12-05 NOTE — Telephone Encounter (Signed)
Has already been done to Dr Hilarie Fredrickson from June 23 visit  Please forward to [facility]

## 2013-12-09 NOTE — Telephone Encounter (Signed)
Pt has acute appt scheduled for tomorrow, 12/10/13, at 8:45am with Dr. Carlean Purl. Pt is aware.

## 2013-12-10 ENCOUNTER — Ambulatory Visit: Payer: Federal, State, Local not specified - PPO | Admitting: Internal Medicine

## 2013-12-12 ENCOUNTER — Other Ambulatory Visit (INDEPENDENT_AMBULATORY_CARE_PROVIDER_SITE_OTHER): Payer: Federal, State, Local not specified - PPO

## 2013-12-12 ENCOUNTER — Ambulatory Visit (INDEPENDENT_AMBULATORY_CARE_PROVIDER_SITE_OTHER): Payer: Federal, State, Local not specified - PPO | Admitting: Internal Medicine

## 2013-12-12 ENCOUNTER — Encounter: Payer: Self-pay | Admitting: Internal Medicine

## 2013-12-12 VITALS — BP 124/98 | HR 68 | Ht 67.32 in | Wt 214.1 lb

## 2013-12-12 DIAGNOSIS — Z1211 Encounter for screening for malignant neoplasm of colon: Secondary | ICD-10-CM

## 2013-12-12 DIAGNOSIS — K8020 Calculus of gallbladder without cholecystitis without obstruction: Secondary | ICD-10-CM

## 2013-12-12 DIAGNOSIS — R1011 Right upper quadrant pain: Secondary | ICD-10-CM

## 2013-12-12 LAB — HEPATIC FUNCTION PANEL
ALT: 23 U/L (ref 0–35)
AST: 22 U/L (ref 0–37)
Albumin: 3.9 g/dL (ref 3.5–5.2)
Alkaline Phosphatase: 68 U/L (ref 39–117)
BILIRUBIN DIRECT: 0.1 mg/dL (ref 0.0–0.3)
BILIRUBIN TOTAL: 0.7 mg/dL (ref 0.2–1.2)
Total Protein: 7.2 g/dL (ref 6.0–8.3)

## 2013-12-12 LAB — LIPASE: Lipase: 37 U/L (ref 11.0–59.0)

## 2013-12-12 LAB — AMYLASE: Amylase: 37 U/L (ref 27–131)

## 2013-12-12 NOTE — Progress Notes (Signed)
Subjective:    Patient ID: Caroline Wong, female    DOB: October 03, 1960, 53 y.o.   MRN: [id number]  HPI  the patient is a very nice married woman here because of abdominal pain. She is having epigastric and right upper quadrant pain that radiates through to the right infrascapular area. It is becoming more frequent over the past several weeks. Some nausea. Hydrocodone with acetaminophen helps take the edge off. This started several weeks ago, interestingly, after her husband had his gallbladder out. Workup to date has shown an abdominal ultrasound limited with gallstones, no gallbladder abnormality otherwise or bile duct dilation, and she has had a HIDA scan with ejection fraction that was high. No fever. No vomiting. She also had a GYN evaluation thinking it might have been a gynecologic source of pain  That was negative. She has been scheduled for a screening colonoscopy but that was canceled and she would like to reschedule that for later on.  She is due to see Dr. Rosendo Gros for her problems next week but would like the appointment moved up.   Allergies  Allergen Reactions  . Biaxin [Clarithromycin] Swelling  . Keflex [Cephalexin] Rash   Outpatient Prescriptions Prior to Visit  Medication Sig Dispense Refill  . amLODipine-benazepril (LOTREL) 10-20 MG per capsule Take 1 capsule by mouth daily.  90 capsule  3  . b complex vitamins tablet Take 1 tablet by mouth daily.      [name] EPINEPHrine (EPIPEN) 0.3 mg/0.3 mL IJ SOAJ injection Inject 0.3 mLs (0.3 mg total) into the muscle once.  2 Device  2  . Estradiol 0.52 MG/0.87 GM (0.06%) GEL Apply 1 application topically daily.  26 g  11  . HYDROcodone-acetaminophen (NORCO/VICODIN) 5-325 MG per tablet Take 1 tablet by mouth every 6 (six) hours as needed for moderate pain.  30 tablet  0  . LORazepam (ATIVAN) 0.5 MG tablet Take 0.5 mg by mouth every 8 (eight) hours.      . metoprolol succinate (TOPROL-XL) 25 MG 24 hr tablet       . NASONEX 50 MCG/ACT  nasal spray Place 2 sprays into the nose daily.       . Probiotic Product (PROBIOTIC DAILY PO) Take by mouth daily.      . progesterone (PROMETRIUM) 200 MG capsule Take 1 at HS  30 capsule  11  . promethazine (PHENERGAN) 25 MG tablet Take 1 tablet (25 mg total) by mouth every 6 (six) hours as needed for nausea or vomiting.  30 tablet  1  . valACYclovir (VALTREX) 1000 MG tablet take 1 tablet by mouth once daily  30 tablet  PRN  . COMBIPATCH 0.05-0.14 MG/DAY        No facility-administered medications prior to visit.   Past Medical History  Diagnosis Date  . Chronic bronchitis   . Hypertension   . Hormone replacement therapy (HRT)   . Abdominal pain 11/25/2013    Has some nausea associated with pain that has been on and off x 2 weeks will get [place]  . Gallstones    Past Surgical History  Procedure Laterality Date  . Wisdom tooth extraction    . Mole removal     History   Social History  . Marital Status: Married    Spouse Name: N/A    Number of Children: 4  . Years of Education: college   Occupational History  . Delivery Supervisor [employer]    Social History Main Topics  . Smoking status: Never  Smoker   . Smokeless tobacco: Never Used  . Alcohol Use: Yes     Comment: rarely  . Drug Use: No  . 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 Breast cancer Mother     breast  . Diabetes Mother   . Fibromyalgia Mother   . Heart disease Mother     CHF  . Hypertension Mother   . Heart disease Father   . Diabetes Brother   . Hypertension Brother     x 2  . Heart disease Maternal Grandmother   . Diabetes Maternal Grandmother   . Asthma Maternal Grandmother   . Arthritis Maternal Grandmother   . Heart disease Maternal Grandfather   . Arthritis  Maternal Grandfather   . Breast cancer Paternal Grandmother   . Heart disease Paternal Grandmother   . Arthritis Paternal Grandmother   . Diabetes Paternal Grandfather   . Heart disease Paternal Grandfather   . Colon cancer Paternal Grandfather     mets  . Arthritis Paternal Grandfather   . Kidney disease Mother     Review of Systems As per history of present illness. All other review of systems negative.    Objective:   Physical Exam General:  Well-developed, well-nourished and in no acute distress Eyes:  anicteric. ENT:   Mouth and posterior pharynx free of lesions.  Neck:   supple w/o thyromegaly or mass.  Lungs: Clear to auscultation bilaterally. Heart:  S1S2, no rubs, murmurs, gallops. Abdomen:  soft, tender in RUQ and epigastrium, no hepatosplenomegaly, hernia, or mass and BS+.  Rectal: deferred Lymph:  no cervical or supraclavicular adenopathy. Extremities:   no edema Neuro:  A&O x 3.  Psych:  appropriate mood and  Affect.   Data Reviewed: As per history of present illness also reviewed primary care office notes GYN notes       Assessment & Plan:  Symptomatic cholelithiasis Has a pretty clear history and findings for this. We have been successful in removing up her appointment with surgery. She will see Dr. Rosendo Gros tomorrow afternoon. I'm going to check LFTs, amylase and lipase. Should he become worse or has fever she should go to the ER.   She still needs a screening colonoscopy we will schedule this for September. She should be over presumed gallbladder surgery at that point.The risks and benefits as well as alternatives of endoscopic procedure(s) have been discussed and reviewed. All questions answered. The patient agrees to proceed.  I appreciate the opportunity to care for this patient.   Copies will go to Dr. Rosendo Gros, Dr. Jenny Reichmann and Derrek Monaco, NP

## 2013-12-12 NOTE — Assessment & Plan Note (Signed)
Has a pretty clear history and findings for this. We have been successful in removing up her appointment with surgery. She will see Dr. Rosendo Gros tomorrow afternoon. I'm going to check LFTs, amylase and lipase. Should he become worse or has fever she should go to the ER.

## 2013-12-12 NOTE — Patient Instructions (Signed)
You have been scheduled for an appointment with Dr. Rosendo Gros at [facility]. Your appointment is on 12/13/13 at 2:00pm. Please arrive at 1:30pm for registration. Make certain to bring a list of current medications, including any over the counter medications or vitamins. Also bring your co-pay if you have one as well as your insurance cards. [facility] is located at [address]. Should you need to reschedule your appointment, please contact them at [phone].  Your physician has requested that you go to the basement for the following lab work before leaving today: LFT's, Amylase, Lipase  You have been scheduled for a colonoscopy. Please follow written instructions given to you at your visit today.  Please pick up your prep supplies at the pharmacy. If you use inhalers (even only as needed), please bring them with you on the day of your procedure. Your physician has requested that you go to www.startemmi.com and enter the access code given to you at your visit today. This web site gives a general overview about your procedure. However, you should still follow specific instructions given to you by our office regarding your preparation for the procedure.  I appreciate the opportunity to care for you.

## 2013-12-13 ENCOUNTER — Encounter (INDEPENDENT_AMBULATORY_CARE_PROVIDER_SITE_OTHER): Payer: Self-pay | Admitting: General Surgery

## 2013-12-13 ENCOUNTER — Ambulatory Visit (INDEPENDENT_AMBULATORY_CARE_PROVIDER_SITE_OTHER): Payer: Federal, State, Local not specified - PPO | Admitting: General Surgery

## 2013-12-13 VITALS — BP 140/86 | HR 78 | Temp 97.4°F | Resp 18 | Wt 215.0 lb

## 2013-12-13 DIAGNOSIS — K8020 Calculus of gallbladder without cholecystitis without obstruction: Secondary | ICD-10-CM

## 2013-12-13 NOTE — Progress Notes (Signed)
Patient ID: Caroline Wong, female   DOB: 10/13/1960, 53 y.o.   MRN: [id number]  No chief complaint on file.   HPI Caroline Wong is a 53 y.o. female.  The patient is a 53 year old female who is referred by Dr. Carlean Purl for evaluation of symptomatic cholelithiasis. The patient had epigastric right upper quadrant pain for the last 2 weeks. Patient recently had an ultrasound which revealed gallstones. Patient also had high scan which showed a normal ejection fraction.  The patient is that she has symptoms provoked by high fatty foods.  HPI  Past Medical History  Diagnosis Date  . Chronic bronchitis   . Hypertension   . Hormone replacement therapy (HRT)   . Abdominal pain 11/25/2013    Has some nausea associated with pain that has been on and off x 2 weeks will get [place]  . Gallstones     Past Surgical History  Procedure Laterality Date  . Wisdom tooth extraction    . Mole removal      Family History  Problem Relation Age of Onset  . Breast cancer Mother     breast  . Diabetes Mother   . Fibromyalgia Mother   . Heart disease Mother     CHF  . Hypertension Mother   . Heart disease Father   . Diabetes Brother   . Hypertension Brother     x 2  . Heart disease Maternal Grandmother   . Diabetes Maternal Grandmother   . Asthma Maternal Grandmother   . Arthritis Maternal Grandmother   . Heart disease Maternal Grandfather   . Arthritis Maternal Grandfather   . Breast cancer Paternal Grandmother   . Heart disease Paternal Grandmother   . Arthritis Paternal Grandmother   . Diabetes Paternal Grandfather   . Heart disease Paternal Grandfather   . Colon cancer Paternal Grandfather     mets  . Arthritis Paternal Grandfather   . Kidney disease Mother     Social History History  Substance Use Topics  . Smoking status: Never Smoker   . Smokeless tobacco: Never Used  . Alcohol Use: Yes     Comment: rarely    Allergies  Allergen Reactions  . Biaxin [Clarithromycin] Swelling   . Keflex [Cephalexin] Rash    Current Outpatient Prescriptions  Medication Sig Dispense Refill  . amLODipine-benazepril (LOTREL) 10-20 MG per capsule Take 1 capsule by mouth daily.  90 capsule  3  . b complex vitamins tablet Take 1 tablet by mouth daily.      . Cholecalciferol (VITAMIN D-3 PO) Take 1 tablet by mouth daily.      [name] EPINEPHrine (EPIPEN) 0.3 mg/0.3 mL IJ SOAJ injection Inject 0.3 mLs (0.3 mg total) into the muscle once.  2 Device  2  . Estradiol 0.52 MG/0.87 GM (0.06%) GEL Apply 1 application topically daily.  26 g  11  . HYDROcodone-acetaminophen (NORCO/VICODIN) 5-325 MG per tablet Take 1 tablet by mouth every 6 (six) hours as needed for moderate pain.  30 tablet  0  . LORazepam (ATIVAN) 0.5 MG tablet Take 0.5 mg by mouth every 8 (eight) hours.      . metoprolol succinate (TOPROL-XL) 25 MG 24 hr tablet       . NASONEX 50 MCG/ACT nasal spray Place 2 sprays into the nose daily.       . Probiotic Product (PROBIOTIC DAILY PO) Take by mouth daily.      . progesterone (PROMETRIUM) 200 MG capsule Take 1 at HS  30 capsule  11  . promethazine (PHENERGAN) 25 MG tablet Take 1 tablet (25 mg total) by mouth every 6 (six) hours as needed for nausea or vomiting.  30 tablet  1  . valACYclovir (VALTREX) 1000 MG tablet take 1 tablet by mouth once daily  30 tablet  PRN   No current facility-administered medications for this visit.    Review of Systems Review of Systems  Constitutional: Negative.   HENT: Negative.   Respiratory: Negative.   Cardiovascular: Negative.   Gastrointestinal: Positive for nausea, vomiting and abdominal pain.  Neurological: Negative.   All other systems reviewed and are negative.   Blood pressure 140/86, pulse 78, temperature 97.4 F (36.3 C), temperature source Temporal, resp. rate 18, weight 215 lb (97.523 kg), last menstrual period 12/02/2013.  Physical Exam Physical Exam  Constitutional: She is oriented to person, place, and time. She appears  well-developed and well-nourished.  HENT:  Head: Normocephalic and atraumatic.  Eyes: Conjunctivae and EOM are normal. Pupils are equal, round, and reactive to light.  Neck: Normal range of motion. Neck supple.  Cardiovascular: Normal rate, regular rhythm and normal heart sounds.   Pulmonary/Chest: Effort normal and breath sounds normal.  Abdominal: Soft. Normal appearance. There is tenderness in the right upper quadrant.  Musculoskeletal: Normal range of motion.  Neurological: She is alert and oriented to person, place, and time.  Skin: Skin is warm and dry.  Psychiatric: She has a normal mood and affect.    Data Reviewed As above  Assessment    53 year old female with choledocholithiasis     Plan    1. We'll proceed to the operating room for laparoscopic cholecystectomy 2. All risks and benefits were discussed with the patient to generally include: infection, bleeding, possible need for post op ERCP, damage to the bile ducts, and bile leak. Alternatives were offered and described.  All questions were answered and the patient voiced understanding of the procedure and wishes to proceed at this point with a laparoscopic cholecystectomy         Rosario Jacks., Meila Berke 12/13/2013, 1:48 PM

## 2013-12-19 ENCOUNTER — Other Ambulatory Visit (INDEPENDENT_AMBULATORY_CARE_PROVIDER_SITE_OTHER): Payer: Self-pay | Admitting: General Surgery

## 2013-12-19 ENCOUNTER — Other Ambulatory Visit (INDEPENDENT_AMBULATORY_CARE_PROVIDER_SITE_OTHER): Payer: Self-pay

## 2013-12-19 ENCOUNTER — Ambulatory Visit (INDEPENDENT_AMBULATORY_CARE_PROVIDER_SITE_OTHER): Payer: Federal, State, Local not specified - PPO | Admitting: General Surgery

## 2013-12-19 DIAGNOSIS — K8010 Calculus of gallbladder with chronic cholecystitis without obstruction: Secondary | ICD-10-CM

## 2013-12-19 MED ORDER — OXYCODONE-ACETAMINOPHEN 5-325 MG PO TABS: 1.0000 | ORAL_TABLET | ORAL | Status: DC | PRN

## 2013-12-23 ENCOUNTER — Encounter: Payer: Self-pay | Admitting: Internal Medicine

## 2013-12-24 ENCOUNTER — Encounter (INDEPENDENT_AMBULATORY_CARE_PROVIDER_SITE_OTHER): Payer: Self-pay | Admitting: General Surgery

## 2013-12-24 ENCOUNTER — Ambulatory Visit (INDEPENDENT_AMBULATORY_CARE_PROVIDER_SITE_OTHER): Payer: Federal, State, Local not specified - PPO | Admitting: General Surgery

## 2013-12-24 ENCOUNTER — Telehealth (INDEPENDENT_AMBULATORY_CARE_PROVIDER_SITE_OTHER): Payer: Self-pay

## 2013-12-24 VITALS — BP 148/94 | HR 78 | Temp 97.3°F | Resp 18 | Ht 67.0 in | Wt 212.4 lb

## 2013-12-24 DIAGNOSIS — Z9889 Other specified postprocedural states: Secondary | ICD-10-CM

## 2013-12-24 NOTE — Telephone Encounter (Signed)
Called pt to notify her that I was going to make her an appt to see Dr Redmond Pulling in the urgent office for today and the pt started in about how unhappy she has been with the [facility]. The pt started crying and stated that she has been crying for 3 days b/c of the horrible experience she had at the Surgical [facility]tr. The pt wanted to know if they cathed her at time of sx b/c she did not give consent on the cath and she did not urinate before she left the Surgical Ctr. The pt stated she did not urinate for 24hrs after she was home so she wants to know about her records from the Surgical [facility]tr. I explained that we don't have those records the pt will have to get that from the Surgical Ctr. The pt has already requested for her records yesterday. The pt wants to know what prep was used at the time of sx and I explained to the pt that would be in her records also from the Surgical [facility]tr. I read the op note from Dr Rosendo Gros on the prep but it's just states pt was prepped in sterile fashion. I advised pt that any of her complaints with the Surgical Ctr need to be taken up with them b/c we are separate facility. The pt stated that she did tell the nurse that called her the next day after sx and she was supposed to be having a manager call the pt back. The pt has not heard back from the Surgical [facility]tr. The pt said she is fine with Dr Rosendo Gros and he did a great job. I explained to the pt that she is just seeing one of Dr Ramirez's partners today in urgent office and the pt started asking all kinds of questions like is this appt a hole in the wall. The pt asked would she be able to get out of the office if she needed to. I explained to the pt that she is coming to the same office as before when she saw Dr Rosendo Gros but today she is seeing Dr Redmond Pulling. Pt understands.

## 2013-12-24 NOTE — Telephone Encounter (Signed)
Pt calling in b/c she is concerned with a rash on her skin on the right side of her abdomen and at the belly button. The pt is a s/p lap chole done on 7/16. The pt removed the bandages on 7/17 and 7/18 when she noticed the rash. The pt has not taken anything for the itching or redness. I advised otc Benadryl to see if this would help at all. I explained to the pt that I feel it sounds like this is coming from the bandages that she just had a reaction to the tape. The pt really wanted me to notify Dr Rosendo Gros to see what he thinks b/c she feels this is getting worse even though she has not tried taking anything at all for this. I will send message to Dr Rosendo Gros and Alyse Low.

## 2013-12-24 NOTE — Progress Notes (Signed)
Patient ID: GALIT URICH, female   DOB: Jul 08, 1960, 53 y.o.   MRN: [id number] Post op course Pt is approx 1 week post op.  She had a reaction to the prep as a rash arose on the sterile prep area.  She had a difficult time post op in the PACU at the surgery center, which we had a discussion about.   I assured her that there was nothing out of the ordinary from the surgical point of view, and her case was uncomplicated.  On Exam: Punctate rash over her abd area.  Incision c/d/i  Pathology:   Chronic cholecystitis and cholelithiasis.  This was discussed with the patient.  Assessment and Plan 53 y/o F s/p lap chole. 1. Pt allergic to either the Chloroprep or the dye on the orange Chloroprep stick 2. We will have her f/u as scheduled.  She would like a later appt, i will get Alyse Low to call her with a later day appt if one opens up.   Ralene Ok, MD [facility] [place] General & Minimally Invasive Surgery Trauma & Emergency Surgery

## 2013-12-24 NOTE — Telephone Encounter (Signed)
Incoming call from Cory Munch, Manager at the [facility] to report patient came to request copies of her operative report.  Patient had numerous complaints that she addressed with the manager.  Patient to be seen at our office today by Dr. Rosendo Gros for further evaluation.

## 2013-12-24 NOTE — Telephone Encounter (Signed)
Pt's husband calling back b/c he is concerned about the pt. Pt's husband wants pt seen today b/c he feels the pt's rash is getting worse. I advised pt's husband that the pt only told me the rash was on her right side and at the belly button. Now the husband is calling stating the rash is all over her torso so it sounds like maybe the pt is having a reaction to the prep from surgery. I explained to the husband that the pt just needs to be seen b/c it's hard to determine over the phone what is going on and since the Benadryl is not working for the rash. Husband wants me to call pt to give her an appt time. I will call pt.

## 2014-01-06 ENCOUNTER — Telehealth (INDEPENDENT_AMBULATORY_CARE_PROVIDER_SITE_OTHER): Payer: Self-pay

## 2014-01-06 NOTE — Telephone Encounter (Signed)
Pt s/p lap chole on 12/19/13 with Dr Rosendo Gros. Pt states that she noticed last night a suture that has came out. Informed pt that she has dissolveable sutures and she may she these come out. Pt denies any redness, swelling or tenderness at the incision area. Pt states that she has not had any other complications at this time. Pt has a po appt scheduled for 01/15/14. Advised pt to give [place] a call back if she notices any redness, swelling or tenderness. Pt verbalized understanding and agrees with POC.

## 2014-01-09 ENCOUNTER — Encounter (INDEPENDENT_AMBULATORY_CARE_PROVIDER_SITE_OTHER): Payer: Federal, State, Local not specified - PPO | Admitting: General Surgery

## 2014-01-15 ENCOUNTER — Encounter (INDEPENDENT_AMBULATORY_CARE_PROVIDER_SITE_OTHER): Payer: Self-pay | Admitting: General Surgery

## 2014-01-15 ENCOUNTER — Telehealth: Payer: Self-pay | Admitting: Adult Health

## 2014-01-15 ENCOUNTER — Ambulatory Visit (INDEPENDENT_AMBULATORY_CARE_PROVIDER_SITE_OTHER): Payer: Federal, State, Local not specified - PPO | Admitting: General Surgery

## 2014-01-15 VITALS — BP 144/92 | HR 76 | Temp 98.2°F | Ht 69.0 in | Wt 217.0 lb

## 2014-01-15 DIAGNOSIS — Z9889 Other specified postprocedural states: Secondary | ICD-10-CM

## 2014-01-15 NOTE — Progress Notes (Signed)
Patient ID: TAMYA DENARDO, female   DOB: 28-Oct-1960, 53 y.o.   MRN: [id number] Post op course The patient is a 53 year old female status post laparoscopic cholecystectomy. Patient was seen here approximately to the secondary to skin reaction to the prep on her abdomen.  On Exam: Wounds clean dry and intact. There is no further rash on her abdomen.  Pathology:   Chronic cholecystitis and cholelithiasis.  This was discussed with the patient.  Assessment and Plan 53 year old female status post laparoscopic cholecystectomy 1. Patient in followup as needed 2. We discussed a low-fat diet   Ralene Ok, MD [facility], [place] General & Minimally Invasive Surgery Trauma & Emergency Surgery

## 2014-01-15 NOTE — Telephone Encounter (Signed)
Pt called to say thanks for diagnosing her GB problem she had surgery 7/16 and had like 50 gallstones and feels better

## 2014-02-19 ENCOUNTER — Encounter: Payer: Federal, State, Local not specified - PPO | Admitting: Internal Medicine

## 2014-03-03 ENCOUNTER — Ambulatory Visit (AMBULATORY_SURGERY_CENTER): Payer: Federal, State, Local not specified - PPO | Admitting: Internal Medicine

## 2014-03-03 ENCOUNTER — Encounter: Payer: Self-pay | Admitting: Internal Medicine

## 2014-03-03 VITALS — BP 114/63 | HR 61 | Temp 96.9°F | Resp 16 | Ht 67.32 in | Wt 214.0 lb

## 2014-03-03 DIAGNOSIS — Z1211 Encounter for screening for malignant neoplasm of colon: Secondary | ICD-10-CM

## 2014-03-03 MED ORDER — SODIUM CHLORIDE 0.9 % IV SOLN: 500.0000 mL | INTRAVENOUS | Status: DC

## 2014-03-03 NOTE — Op Note (Signed)
[facility]  [address] [place], [address]   COLONOSCOPY PROCEDURE REPORT  PATIENT: Caroline Wong, Caroline Wong  MR#: [id number] BIRTHDATE: Jul 31, 1960 , 52  yrs. old GENDER: female ENDOSCOPIST: Gatha Mayer, MD, [facility] PROCEDURE DATE:  03/03/2014 PROCEDURE:   Colonoscopy, screening First Screening Colonoscopy - Avg.  risk and is 50 yrs.  old or older Yes.  Prior Negative Screening - Now for repeat screening. N/A  History of Adenoma - Now for follow-up colonoscopy & has been > or = to 3 yrs.  N/A  Polyps Removed Today? No.  Polyps Removed Today? No.  Recommend repeat exam, <10 yrs? Polyps Removed Today? No.  Recommend repeat exam, <10 yrs? No. ASA CLASS:   Class II INDICATIONS:average risk for colorectal cancer and first colonoscopy. MEDICATIONS: Propofol 340 mg IV and Monitored anesthesia care  DESCRIPTION OF PROCEDURE:   After the risks benefits and alternatives of the procedure were thoroughly explained, informed consent was obtained.  The digital rectal exam revealed no abnormalities of the rectum.   The LB [id number]  endoscope was introduced through the anus and advanced to the cecum, which was identified by both the appendix and ileocecal valve. No adverse events experienced.   The quality of the prep was good, using MiraLax  The instrument was then slowly withdrawn as the colon was fully examined.  COLON FINDINGS: A normal appearing cecum, ileocecal valve, and appendiceal orifice were identified.  The ascending, transverse, descending, sigmoid colon, and rectum appeared unremarkable. Retroflexed views revealed no abnormalities. The time to cecum=2 minutes 33 seconds.  Withdrawal time=5 minutes 49 seconds.  The scope was withdrawn and the procedure completed. COMPLICATIONS: There were no immediate complications.  ENDOSCOPIC IMPRESSION: Normal colonoscopy  RECOMMENDATIONS: Repeat colonoscopy/screening again in 10 years - 2025  eSigned:  Gatha Mayer, MD, [facility] 03/03/2014 4:05 PM   cc: Cathlean Cower, MD and The Patient

## 2014-03-03 NOTE — Patient Instructions (Addendum)
The colonoscopy was normal.  You should repeat a routine colonoscopy or do other screening tests in about 10 years - 2025.  I appreciate the opportunity to care for you. Gatha Mayer, MD, FACG    YOU HAD AN ENDOSCOPIC PROCEDURE TODAY AT [facility]: Refer to the procedure report that was given to you for any specific questions about what was found during the examination.  If the procedure report does not answer your questions, please call your gastroenterologist to clarify.  If you requested that your care partner not be given the details of your procedure findings, then the procedure report has been included in a sealed envelope for you to review at your convenience later.  YOU SHOULD EXPECT: Some feelings of bloating in the abdomen. Passage of more gas than usual.  Walking can help get rid of the air that was put into your GI tract during the procedure and reduce the bloating. If you had a lower endoscopy (such as a colonoscopy or flexible sigmoidoscopy) you may notice spotting of blood in your stool or on the toilet paper. If you underwent a bowel prep for your procedure, then you may not have a normal bowel movement for a few days.  DIET: Your first meal following the procedure should be a light meal and then it is ok to progress to your normal diet.  A half-sandwich or bowl of soup is an example of a good first meal.  Heavy or fried foods are harder to digest and may make you feel nauseous or bloated.  Likewise meals heavy in dairy and vegetables can cause extra gas to form and this can also increase the bloating.  Drink plenty of fluids but you should avoid alcoholic beverages for 24 hours.  ACTIVITY: Your care partner should take you home directly after the procedure.  You should plan to take it easy, moving slowly for the rest of the day.  You can resume normal activity the day after the procedure however you should NOT DRIVE or use heavy machinery for 24 hours (because of  the sedation medicines used during the test).    SYMPTOMS TO REPORT IMMEDIATELY: A gastroenterologist can be reached at any hour.  During normal business hours, 8:30 AM to 5:00 PM Monday through Friday, call [phone].  After hours and on weekends, please call the GI answering service at [phone] who will take a message and have the physician on call contact you.   Following lower endoscopy (colonoscopy or flexible sigmoidoscopy):  Excessive amounts of blood in the stool  Significant tenderness or worsening of abdominal pains  Swelling of the abdomen that is new, acute  Fever of 100F or higher  FOLLOW UP: If any biopsies were taken you will be contacted by phone or by letter within the next 1-3 weeks.  Call your gastroenterologist if you have not heard about the biopsies in 3 weeks.  Our staff will call the home number listed on your records the next business day following your procedure to check on you and address any questions or concerns that you may have at that time regarding the information given to you following your procedure. This is a courtesy call and so if there is no answer at the home number and we have not heard from you through the emergency physician on call, we will assume that you have returned to your regular daily activities without incident.  SIGNATURES/CONFIDENTIALITY: You and/or your care partner have signed paperwork which  will be entered into your electronic medical record.  These signatures attest to the fact that that the information above on your After Visit Summary has been reviewed and is understood.  Full responsibility of the confidentiality of this discharge information lies with you and/or your care-partner.   Resume medications.

## 2014-03-03 NOTE — Progress Notes (Signed)
Report to PACU, RN, vss, BBS= Clear.  

## 2014-03-04 ENCOUNTER — Telehealth: Payer: Self-pay | Admitting: *Deleted

## 2014-03-04 NOTE — Telephone Encounter (Signed)
  Follow up Call-  Call back number 03/03/2014  Post procedure Call Back phone  # [phone] cell  Permission to leave phone message Yes     Patient questions:  Do you have a fever, pain , or abdominal swelling? No. Pain Score  0 *  Have you tolerated food without any problems? Yes.    Have you been able to return to your normal activities? Yes.    Do you have any questions about your discharge instructions: Diet   No. Medications  No. Follow up visit  No.  Do you have questions or concerns about your Care? No.  Actions: * If pain score is 4 or above: No action needed, pain <4.

## 2014-03-06 ENCOUNTER — Encounter: Payer: Self-pay | Admitting: Podiatry

## 2014-03-06 ENCOUNTER — Ambulatory Visit (INDEPENDENT_AMBULATORY_CARE_PROVIDER_SITE_OTHER): Payer: Federal, State, Local not specified - PPO | Admitting: Podiatry

## 2014-03-06 VITALS — BP 144/97 | HR 76 | Resp 16

## 2014-03-06 DIAGNOSIS — M722 Plantar fascial fibromatosis: Secondary | ICD-10-CM

## 2014-03-06 NOTE — Progress Notes (Signed)
Subjective:     Patient ID: Caroline Wong, female   DOB: 1960/10/12, 53 y.o.   MRN: [id number]  HPI patient presents stating the bottom of my left arch is really bothering me where the nodules are   Review of Systems     Objective:   Physical Exam Neurovascular status found to be intact with muscle strength adequate and range of motion within normal limits. 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 I would recommend that these be removed and that we do send a softer pathology and she agrees on this treatment plan. At this time I allowed her to read a consent form concerning correction explaining all possible complications and that we may need to take a portion of the plantar fascia depending on where the nodules are. She wants procedure signs consent form and will schedule after she talks to work. She will be in a boot for 3-4 weeks and total recovery. From procedure such as this can be up to 6 months

## 2014-03-07 ENCOUNTER — Telehealth: Payer: Self-pay | Admitting: *Deleted

## 2014-03-07 NOTE — Telephone Encounter (Signed)
Dr. Paulla Dolly is my doctor.  He mentioned yesterday that he wants to operate on the growth on the bottom of my foot.  Is it something that needs to be done immediately?  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  Please give me a call.

## 2014-03-09 NOTE — Telephone Encounter (Signed)
Yes, she can wait until the 1st of the year

## 2014-03-11 NOTE — Telephone Encounter (Signed)
I called and informed her that Dr. Paulla Dolly said it's okay to wait until the first of the year.  She stated, "Okay great, I didn't want to put my work in a bind.  It's our busiest time of year.  Tell him thank you!

## 2014-03-12 ENCOUNTER — Telehealth: Payer: Self-pay | Admitting: *Deleted

## 2014-03-12 NOTE — Telephone Encounter (Signed)
This is a message for Caroline Wong.  I need to schedule my foot surgery please.  If you'll give me a call back I'd appreciate it.  Thank you.  I returned her call.  She asked to schedule surgery the middle of January around the 18th.  I told her that week is not available.  She asked for the following week.  I offered her 07/01/2014.  She asked for the time.  I told her right now it will be scheduled for 7:30am and arrival time will be 6:30am.  Surgical center will call either the Friday or Monday before to give you the exact time to arrive.  She stated okay thank you.

## 2014-04-07 ENCOUNTER — Ambulatory Visit: Payer: Federal, State, Local not specified - PPO | Admitting: Podiatry

## 2014-04-07 ENCOUNTER — Encounter: Payer: Self-pay | Admitting: Podiatry

## 2014-04-08 ENCOUNTER — Telehealth: Payer: Self-pay | Admitting: *Deleted

## 2014-04-08 NOTE — Telephone Encounter (Signed)
Pt's Progesterone 200 mg Rx needed auth. I called insurance company and spoke with [employer]. Med was approved 02/07/14-10/05/14. [facility] in [place] was notified. [place]

## 2014-04-14 ENCOUNTER — Encounter: Payer: Self-pay | Admitting: Adult Health

## 2014-04-14 ENCOUNTER — Telehealth: Payer: Self-pay | Admitting: *Deleted

## 2014-04-14 ENCOUNTER — Ambulatory Visit (INDEPENDENT_AMBULATORY_CARE_PROVIDER_SITE_OTHER): Payer: Federal, State, Local not specified - PPO | Admitting: Adult Health

## 2014-04-14 VITALS — BP 140/86 | Ht 68.0 in | Wt 220.5 lb

## 2014-04-14 DIAGNOSIS — R61 Generalized hyperhidrosis: Secondary | ICD-10-CM | POA: Insufficient documentation

## 2014-04-14 HISTORY — DX: Generalized hyperhidrosis: R61

## 2014-04-14 NOTE — Progress Notes (Signed)
Subjective:     Patient ID: Caroline Wong, female   DOB: 29-Dec-1960, 53 y.o.   MRN: [id number]  HPI Caroline Wong is a 53 year old white female in complaining of night sweats for 3 weeks.Not happy with weight, but will want to discuss more at F/U, really wants sweats to stop.  Review of Systems See HPI Reviewed past medical,surgical, social and family history. Reviewed medications and allergies.     Objective:   Physical Exam BP 140/86 mmHg  Ht 5\' 8"  (1.727 m)  Wt 220 lb 8 oz (100.018 kg)  BMI 33.53 kg/m2   Increase estradol gel to 1 pump bid and continue prometrium  Assessment:     Night sweats    Plan:    Increase gel to 2 pumps a day Return in 3 weeks for ros

## 2014-04-14 NOTE — Patient Instructions (Signed)
Follow up in 3 weeks increase gel to 2 pumps a day

## 2014-04-14 NOTE — Telephone Encounter (Signed)
Pt's Prometrium has been approved for 02/07/14-10/05/14. [place]

## 2014-05-07 ENCOUNTER — Ambulatory Visit (INDEPENDENT_AMBULATORY_CARE_PROVIDER_SITE_OTHER): Payer: Federal, State, Local not specified - PPO | Admitting: Adult Health

## 2014-05-07 ENCOUNTER — Encounter: Payer: Self-pay | Admitting: Adult Health

## 2014-05-07 VITALS — BP 122/80 | Ht 68.0 in | Wt 223.0 lb

## 2014-05-07 DIAGNOSIS — E669 Obesity, unspecified: Secondary | ICD-10-CM

## 2014-05-07 DIAGNOSIS — B379 Candidiasis, unspecified: Secondary | ICD-10-CM

## 2014-05-07 DIAGNOSIS — R61 Generalized hyperhidrosis: Secondary | ICD-10-CM

## 2014-05-07 HISTORY — DX: Candidiasis, unspecified: B37.9

## 2014-05-07 HISTORY — DX: Obesity, unspecified: E66.9

## 2014-05-07 MED ORDER — PHENTERMINE HCL 37.5 MG PO CAPS: 37.5000 mg | ORAL_CAPSULE | ORAL | Status: DC

## 2014-05-07 NOTE — Patient Instructions (Addendum)
Yeast Infection of the Skin Some yeast on the skin is normal, but sometimes it causes an infection. If you have a yeast infection, it shows up as white or light brown patches on brown skin. You can see it better in the summer on tan skin. It causes light-colored holes in your suntan. It can happen on any area of the body. This cannot be passed from person to person. HOME CARE  Scrub your skin daily with a dandruff shampoo. Your rash may take a couple weeks to get well.  Do not scratch or itch the rash. GET HELP RIGHT AWAY IF:   You get another infection from scratching. The skin may get warm, red, and may ooze fluid.  The infection does not seem to be getting better. MAKE SURE YOU:  Understand these instructions.  Will watch your condition.  Will get help right away if you are not doing well or get worse. Document Released: 05/05/2008 Document Revised: 08/15/2011 Document Reviewed: 05/05/2008 [facility] Patient Information 2015 [place], [place]. This information is not intended to replace advice given to you by your health care provider. Make sure you discuss any questions you have with your health care provider. Calorie Counting for Weight Loss Calories are energy you get from the things you eat and drink. Your body uses this energy to keep you going throughout the day. The number of calories you eat affects your weight. When you eat more calories than your body needs, your body stores the extra calories as fat. When you eat fewer calories than your body needs, your body burns fat to get the energy it needs. Calorie counting means keeping track of how many calories you eat and drink each day. If you make sure to eat fewer calories than your body needs, you should lose weight. In order for calorie counting to work, you will need to eat the number of calories that are right for you in a day to lose a healthy amount of weight per week. A healthy amount of weight to lose per week is usually 1-2 lb  (0.5-0.9 kg). A dietitian can determine how many calories you need in a day and give you suggestions on how to reach your calorie goal.  WHAT IS MY MY PLAN? My goal is to have __________ calories per day.  If I have this many calories per day, I should lose around __________ pounds per week. WHAT DO I NEED TO KNOW ABOUT CALORIE COUNTING? In order to meet your daily calorie goal, you will need to:  Find out how many calories are in each food you would like to eat. Try to do this before you eat.  Decide how much of the food you can eat.  Write down what you ate and how many calories it had. Doing this is called keeping a food log. WHERE DO I FIND CALORIE INFORMATION? The number of calories in a food can be found on a Nutrition Facts label. Note that all the information on a label is based on a specific serving of the food. If a food does not have a Nutrition Facts label, try to look up the calories online or ask your dietitian for help. HOW DO I DECIDE HOW MUCH TO EAT? To decide how much of the food you can eat, you will need to consider both the number of calories in one serving and the size of one serving. This information can be found on the Nutrition Facts label. If a food does not have  a Nutrition Facts label, look up the information online or ask your dietitian for help. Remember that calories are listed per serving. If you choose to have more than one serving of a food, you will have to multiply the calories per serving by the amount of servings you plan to eat. For example, the label on a package of bread might say that a serving size is 1 slice and that there are 90 calories in a serving. If you eat 1 slice, you will have eaten 90 calories. If you eat 2 slices, you will have eaten 180 calories. HOW DO I KEEP A FOOD LOG? After each meal, record the following information in your food log:  What you ate.  How much of it you ate.  How many calories it had.  Then, add up your  calories. Keep your food log near you, such as in a small notebook in your pocket. Another option is to use a mobile app or website. Some programs will calculate calories for you and show you how many calories you have left each time you add an item to the log. WHAT ARE SOME CALORIE COUNTING TIPS?  Use your calories on foods and drinks that will fill you up and not leave you hungry. Some examples of this include foods like nuts and nut butters, vegetables, lean proteins, and high-fiber foods (more than 5 g fiber per serving).  Eat nutritious foods and avoid empty calories. Empty calories are calories you get from foods or beverages that do not have many nutrients, such as candy and soda. It is better to have a nutritious high-calorie food (such as an avocado) than a food with few nutrients (such as a bag of chips).  Know how many calories are in the foods you eat most often. This way, you do not have to look up how many calories they have each time you eat them.  Look out for foods that may seem like low-calorie foods but are really high-calorie foods, such as baked goods, soda, and fat-free candy.  Pay attention to calories in drinks. Drinks such as sodas, specialty coffee drinks, alcohol, and juices have a lot of calories yet do not fill you up. Choose low-calorie drinks like water and diet drinks.  Focus your calorie counting efforts on higher calorie items. Logging the calories in a garden salad that contains only vegetables is less important than calculating the calories in a milk shake.  Find a way of tracking calories that works for you. Get creative. Most people who are successful find ways to keep track of how much they eat in a day, even if they do not count every calorie. WHAT ARE SOME PORTION CONTROL TIPS?  Know how many calories are in a serving. This will help you know how many servings of a certain food you can have.  Use a measuring cup to measure serving sizes. This is helpful  when you start out. With time, you will be able to estimate serving sizes for some foods.  Take some time to put servings of different foods on your favorite plates, bowls, and cups so you know what a serving looks like.  Try not to eat straight from a bag or box. Doing this can lead to overeating. Put the amount you would like to eat in a cup or on a plate to make sure you are eating the right portion.  Use smaller plates, glasses, and bowls to prevent overeating. This is a quick and easy  way to practice portion control. If your plate is smaller, less food can fit on it.  Try not to multitask while eating, such as watching TV or using your computer. If it is time to eat, sit down at a table and enjoy your food. Doing this will help you to start recognizing when you are full. It will also make you more aware of what and how much you are eating. HOW CAN I CALORIE COUNT WHEN EATING OUT?  Ask for smaller portion sizes or child-sized portions.  Consider sharing an entree and sides instead of getting your own entree.  If you get your own entree, eat only half. Ask for a box at the beginning of your meal and put the rest of your entree in it so you are not tempted to eat it.  Look for the calories on the menu. If calories are listed, choose the lower calorie options.  Choose dishes that include vegetables, fruits, whole grains, low-fat dairy products, and lean protein. Focusing on smart food choices from each of the 5 food groups can help you stay on track at restaurants.  Choose items that are boiled, broiled, grilled, or steamed.  Choose water, milk, unsweetened iced tea, or other drinks without added sugars. If you want an alcoholic beverage, choose a lower calorie option. For example, a regular margarita can have up to 700 calories and a glass of wine has around 150.  Stay away from items that are buttered, battered, fried, or served with cream sauce. Items labeled "crispy" are usually fried,  unless stated otherwise.  Ask for dressings, sauces, and syrups on the side. These are usually very high in calories, so do not eat much of them.  Watch out for salads. Many people think salads are a healthy option, but this is often not the case. Many salads come with bacon, fried chicken, lots of cheese, fried chips, and dressing. All of these items have a lot of calories. If you want a salad, choose a garden salad and ask for grilled meats or steak. Ask for the dressing on the side, or ask for olive oil and vinegar or lemon to use as dressing.  Estimate how many servings of a food you are given. For example, a serving of cooked rice is  cup or about the size of half a tennis ball or one cupcake wrapper. Knowing serving sizes will help you be aware of how much food you are eating at restaurants. The list below tells you how big or small some common portion sizes are based on everyday objects.  1 oz--4 stacked dice.  3 oz--1 deck of cards.  1 tsp--1 dice.  1 Tbsp-- a Ping-Pong ball.  2 Tbsp--1 Ping-Pong ball.   cup--1 tennis ball or 1 cupcake wrapper.  1 cup--1 baseball. Document Released: 05/23/2005 Document Revised: 10/07/2013 Document Reviewed: 03/28/2013 [facility] Patient Information 2015 [place], [place]. This information is not intended to replace advice given to you by your health care provider. Make sure you discuss any questions you have with your health care provider. Try adipex Follow up in 4 weeks

## 2014-05-07 NOTE — Progress Notes (Signed)
Subjective:     Patient ID: MAYMIE BRUNKE, female   DOB: 27-Sep-1960, 53 y.o.   MRN: [id number]  HPI Arianis is a 53 year old white female, married, back in follow up of increasing elestrin to 2 pumps.Had yeast infection over weekend and took diflucan is better has some external itch.Wants to try adipex to lose weight.Has used before with good results.  Review of Systems See HPI Reviewed past medical,surgical, social and family history. Reviewed medications and allergies.     Objective:   Physical Exam BP 122/80 mmHg  Ht 5\' 8"  (1.727 m)  Wt 223 lb (101.152 kg)  BMI 33.91 kg/m2  LMP 04/30/2014 Skin warm and dry.Pelvic: external genitalia is irritated in appearance, vagina: scant discharge without odor, cervix:smooth and bulbous, uterus: normal size, shape and contour, non tender, no masses felt, adnexa: no masses or tenderness noted.Painted labia with gentian violet.   Says night sweats much better now. Assessment:    Yeast infection Obesity  Night sweats resolving     Plan:    Rx adipex 37.5 mg #30 1 daily no refills Follow up in 4 weeks for weight check Review handout on weight loss

## 2014-06-02 ENCOUNTER — Emergency Department (HOSPITAL_COMMUNITY)
Admission: EM | Admit: 2014-06-02 | Discharge: 2014-06-03 | Disposition: A | Discharge status: Home / Self Care | Payer: Federal, State, Local not specified - PPO | Attending: Emergency Medicine | Admitting: Emergency Medicine

## 2014-06-02 ENCOUNTER — Encounter (HOSPITAL_COMMUNITY): Payer: Self-pay | Admitting: *Deleted

## 2014-06-02 DIAGNOSIS — E669 Obesity, unspecified: Secondary | ICD-10-CM | POA: Insufficient documentation

## 2014-06-02 DIAGNOSIS — Z79899 Other long term (current) drug therapy: Secondary | ICD-10-CM | POA: Diagnosis not present

## 2014-06-02 DIAGNOSIS — Z8619 Personal history of other infectious and parasitic diseases: Secondary | ICD-10-CM | POA: Insufficient documentation

## 2014-06-02 DIAGNOSIS — I10 Essential (primary) hypertension: Secondary | ICD-10-CM | POA: Insufficient documentation

## 2014-06-02 DIAGNOSIS — A499 Bacterial infection, unspecified: Secondary | ICD-10-CM

## 2014-06-02 DIAGNOSIS — N390 Urinary tract infection, site not specified: Secondary | ICD-10-CM | POA: Diagnosis not present

## 2014-06-02 DIAGNOSIS — Z9089 Acquired absence of other organs: Secondary | ICD-10-CM | POA: Diagnosis not present

## 2014-06-02 DIAGNOSIS — F419 Anxiety disorder, unspecified: Secondary | ICD-10-CM | POA: Diagnosis not present

## 2014-06-02 DIAGNOSIS — R319 Hematuria, unspecified: Secondary | ICD-10-CM | POA: Diagnosis present

## 2014-06-02 LAB — URINALYSIS, ROUTINE W REFLEX MICROSCOPIC
BILIRUBIN URINE: NEGATIVE
Glucose, UA: NEGATIVE mg/dL
Ketones, ur: NEGATIVE mg/dL
NITRITE: NEGATIVE
PH: 6 (ref 5.0–8.0)
Protein, ur: NEGATIVE mg/dL
SPECIFIC GRAVITY, URINE: 1.01 (ref 1.005–1.030)
Urobilinogen, UA: 0.2 mg/dL (ref 0.0–1.0)

## 2014-06-02 LAB — URINE MICROSCOPIC-ADD ON

## 2014-06-02 MED ORDER — CIPROFLOXACIN HCL 250 MG PO TABS
500.0000 mg | ORAL_TABLET | Freq: Once | ORAL | Status: AC
  Administered 2014-06-03: 500 mg via ORAL
  Filled 2014-06-02: qty 2

## 2014-06-02 MED ORDER — CIPROFLOXACIN HCL 500 MG PO TABS: 500.0000 mg | ORAL_TABLET | Freq: Two times a day (BID) | ORAL | Status: DC

## 2014-06-02 NOTE — ED Provider Notes (Signed)
CSN: [id number]     Arrival date & time 06/02/14  2131 History  This chart was scribed for Caroline Speak, MD by [name], ED Scribe. This patient was seen in room APA14/APA14 and the patient's care was started at 11:04 PM.  Chief Complaint  Patient presents with  . Hematuria   The history is provided by the patient. No language interpreter was used.    HPI Comments: Caroline Wong is a 53 y.o. female who presents to the Emergency Department complaining of hematuria that began a few days ago. She reports associated increased urgency, decreased urine output, urinary retention and pressure in bladder. She denies associated nausea, vomiting, fever, chills, abdominal pain.  Past Medical History  Diagnosis Date  . Chronic bronchitis   . Hypertension   . Hormone replacement therapy (HRT)   . Abdominal pain 11/25/2013    Has some nausea associated with pain that has been on and off x 2 weeks will get [place]  . Gallstones   . Anxiety   . Night sweats 04/14/2014  . Yeast infection 05/07/2014  . Obesity 05/07/2014   Past Surgical History  Procedure Laterality Date  . Wisdom tooth extraction    . Mole removal    . Cholecystectomy     Family History  Problem Relation Age of Onset  . Breast cancer Mother     breast  . Diabetes Mother   . Fibromyalgia Mother   . Heart disease Mother     CHF  . Hypertension Mother   . Kidney disease Mother   . Heart disease Father   . Diabetes Brother   . Hypertension Brother     x 2  . Heart disease Maternal Grandmother   . Diabetes Maternal Grandmother   . Asthma Maternal Grandmother   . Arthritis Maternal Grandmother   . Heart disease Maternal Grandfather   . Arthritis Maternal Grandfather   . Cancer Maternal Grandfather     bladder  . Breast cancer Paternal Grandmother   . Heart disease Paternal Grandmother   . Arthritis Paternal Grandmother   . Stroke Paternal Grandmother   . Diabetes Paternal Grandfather   . Heart disease Paternal  Grandfather   . Colon cancer Paternal Grandfather     mets  . Arthritis Paternal Grandfather   . Stomach cancer Paternal Grandfather     mets to stomach  . Esophageal cancer Neg Hx   . Pancreatic cancer Neg Hx   . Rectal cancer Neg Hx   . Hypertension Brother    History  Substance Use Topics  . Smoking status: Never Smoker   . Smokeless tobacco: Never Used  . Alcohol Use: Yes     Comment: rarely   OB History    Gravida Para Term Preterm AB TAB SAB Ectopic Multiple Living   2 2        2      Review of Systems  Genitourinary: Positive for urgency, hematuria and decreased urine volume. Negative for frequency.  All other systems reviewed and are negative.  Allergies  Biaxin; Chloraprep one step; and Keflex  Home Medications   Prior to Admission medications   Medication Sig Start Date End Date Taking? Authorizing Provider  amLODipine-benazepril (LOTREL) 10-20 MG per capsule Take 1 capsule by mouth daily. 11/26/13   Biagio Borg, MD  Cholecalciferol (VITAMIN D-3 PO) Take 1 tablet by mouth daily.    Historical Provider, MD  EPINEPHrine (EPIPEN) 0.3 mg/0.3 mL IJ SOAJ injection Inject 0.3 mLs (0.3  mg total) into the muscle once. 11/26/13   Biagio Borg, MD  Estradiol 0.52 MG/0.87 GM (0.06%) GEL Apply 1 application topically daily. 09/09/13   Estill Dooms, NP  LORazepam (ATIVAN) 0.5 MG tablet Take 0.5 mg by mouth every 8 (eight) hours.    Historical Provider, MD  metoprolol succinate (TOPROL-XL) 25 MG 24 hr tablet daily.  08/15/13   Historical Provider, MD  NASONEX 50 MCG/ACT nasal spray Place 2 sprays into the nose daily.  05/16/13   Historical Provider, MD  phentermine 37.5 MG capsule Take 1 capsule (37.5 mg total) by mouth every morning. 05/07/14   Estill Dooms, NP  progesterone (PROMETRIUM) 200 MG capsule Take 1 at [facility] 09/09/13   Estill Dooms, NP  promethazine (PHENERGAN) 25 MG tablet  12/04/13   Historical Provider, MD  valACYclovir (VALTREX) 1000 MG tablet take 1 tablet  by mouth once daily 08/21/13   Estill Dooms, NP   Triage Vitals: BP 168/99 mmHg  Pulse 78  Temp(Src) 98.4 F (36.9 C) (Oral)  Resp 20  Ht 5\' 9"  (1.753 m)  Wt 210 lb (95.255 kg)  BMI 31.00 kg/m2  SpO2 100%  LMP 05/30/2014  Physical Exam  Constitutional: She is oriented to person, place, and time. She appears well-developed and well-nourished. No distress.  HENT:  Head: Normocephalic and atraumatic.  Mouth/Throat: Oropharynx is clear and moist.  Eyes: Conjunctivae and EOM are normal. Pupils are equal, round, and reactive to light.  Neck: Neck supple. No tracheal deviation present.  Cardiovascular: Normal rate, regular rhythm and normal heart sounds.   Pulmonary/Chest: Effort normal and breath sounds normal. No respiratory distress. She has no wheezes.  Abdominal: Soft. She exhibits no distension. There is no tenderness. There is no rebound and no guarding.  Musculoskeletal: Normal range of motion. She exhibits no edema or tenderness.  Neurological: She is alert and oriented to person, place, and time.  Skin: Skin is warm and dry.  Psychiatric: She has a normal mood and affect. Her behavior is normal.  Nursing note and vitals reviewed.  ED Course  Procedures (including critical care time)  DIAGNOSTIC STUDIES: Oxygen Saturation is 100% on RA, normal by my interpretation.    COORDINATION OF CARE: 11:45 PM- Discussed plans to order diagnostic urinalysis. Pt advised of plan for treatment and pt agrees.  Labs Review Labs Reviewed  URINALYSIS, ROUTINE W REFLEX MICROSCOPIC   Imaging Review No results found.   EKG Interpretation None     MDM   Final diagnoses:  None    Patient presents with complaints of dysuria for the past 2 days. She believes she may have seen blood in her urine. She denies flank pain, fevers, or chills. Workup reveals a urinalysis which reveals mainly blood. Her symptoms are very suspicious for a UTI and will be treated with Cipro. If she is not  improving in the next 2-3 days, she should follow-up with her doctor and return if her symptoms worsen. I considered a kidney stone, however her symptoms are not typical for this. She may require imaging if her symptoms worsen or do not improve.  I personally performed the services described in this documentation, which was scribed in my presence. The recorded information has been reviewed and is accurate.  Caroline Speak, MD 06/02/14 2352

## 2014-06-02 NOTE — ED Notes (Signed)
Pt states that she believes she may by passing blood in urine.  Reporting some urinary retention.  Reporting some UTI symptoms. Denies fever or nausea and vomiting.

## 2014-06-02 NOTE — Discharge Instructions (Signed)
Cipro as prescribed.  Return to the emergency department if you develop severe abdominal pain, high fever, vomiting, or any other new and concerning symptoms.   Urinary Tract Infection Urinary tract infections (UTIs) can develop anywhere along your urinary tract. Your urinary tract is your body's drainage system for removing wastes and extra water. Your urinary tract includes two kidneys, two ureters, a bladder, and a urethra. Your kidneys are a pair of bean-shaped organs. Each kidney is about the size of your fist. They are located below your ribs, one on each side of your spine. CAUSES Infections are caused by microbes, which are microscopic organisms, including fungi, viruses, and bacteria. These organisms are so small that they can only be seen through a microscope. Bacteria are the microbes that most commonly cause UTIs. SYMPTOMS  Symptoms of UTIs may vary by age and gender of the patient and by the location of the infection. Symptoms in young women typically include a frequent and intense urge to urinate and a painful, burning feeling in the bladder or urethra during urination. Older women and men are more likely to be tired, shaky, and weak and have muscle aches and abdominal pain. A fever may mean the infection is in your kidneys. Other symptoms of a kidney infection include pain in your back or sides below the ribs, nausea, and vomiting. DIAGNOSIS To diagnose a UTI, your caregiver will ask you about your symptoms. Your caregiver also will ask to provide a urine sample. The urine sample will be tested for bacteria and white blood cells. White blood cells are made by your body to help fight infection. TREATMENT  Typically, UTIs can be treated with medication. Because most UTIs are caused by a bacterial infection, they usually can be treated with the use of antibiotics. The choice of antibiotic and length of treatment depend on your symptoms and the type of bacteria causing your infection. HOME  CARE INSTRUCTIONS  If you were prescribed antibiotics, take them exactly as your caregiver instructs you. Finish the medication even if you feel better after you have only taken some of the medication.  Drink enough water and fluids to keep your urine clear or pale yellow.  Avoid caffeine, tea, and carbonated beverages. They tend to irritate your bladder.  Empty your bladder often. Avoid holding urine for long periods of time.  Empty your bladder before and after sexual intercourse.  After a bowel movement, women should cleanse from front to back. Use each tissue only once. SEEK MEDICAL CARE IF:   You have back pain.  You develop a fever.  Your symptoms do not begin to resolve within 3 days. SEEK IMMEDIATE MEDICAL CARE IF:   You have severe back pain or lower abdominal pain.  You develop chills.  You have nausea or vomiting.  You have continued burning or discomfort with urination. MAKE SURE YOU:   Understand these instructions.  Will watch your condition.  Will get help right away if you are not doing well or get worse. Document Released: 03/02/2005 Document Revised: 11/22/2011 Document Reviewed: 07/01/2011 [facility] Patient Information 2015 [place], [place]. This information is not intended to replace advice given to you by your health care provider. Make sure you discuss any questions you have with your health care provider.

## 2014-06-03 NOTE — ED Notes (Signed)
Discharge instructions and prescription given and reviewed with patient.  Patient verbalized understanding to complete all antibiotic and to return to ED for worsening symptoms.  Patient ambulatory; discharged home in good condition.

## 2014-06-03 NOTE — ED Notes (Signed)
Family at bedside. Patient states that she has High BP. States that she has not had her BP medicine in almost 24 hrs. States that she takes her meds when she gets ready for work at 430am daily.

## 2014-06-04 ENCOUNTER — Ambulatory Visit (INDEPENDENT_AMBULATORY_CARE_PROVIDER_SITE_OTHER): Payer: Federal, State, Local not specified - PPO | Admitting: Adult Health

## 2014-06-04 ENCOUNTER — Encounter: Payer: Self-pay | Admitting: Adult Health

## 2014-06-04 VITALS — BP 110/80 | Ht 67.25 in | Wt 214.0 lb

## 2014-06-04 DIAGNOSIS — E669 Obesity, unspecified: Secondary | ICD-10-CM

## 2014-06-04 MED ORDER — PHENTERMINE HCL 37.5 MG PO CAPS: 37.5000 mg | ORAL_CAPSULE | ORAL | Status: DC

## 2014-06-04 NOTE — Patient Instructions (Signed)
Continue weight loss efforts Follow up in 4 weeks

## 2014-06-04 NOTE — Progress Notes (Signed)
Subjective:     Patient ID: Caroline Wong, female   DOB: 27-Apr-1961, 53 y.o.   MRN: [id number]  HPI Caroline Wong is a 53 year old white female in for weight and BP check.She was seen in ER 12/28 for UTI. She is happy with her weight loss efforts, she lost 9 lbs. Review of Systems See HPI Reviewed past medical,surgical, social and family history. Reviewed medications and allergies.     Objective:   Physical Exam BP 110/80 mmHg  Ht 5' 7.25" (1.708 m)  Wt 214 lb (97.07 kg)  BMI 33.27 kg/m2  LMP 05/23/2014   Skin warm and dry. Lungs: clear to ausculation bilaterally. Cardiovascular: regular rate and rhythm.Has lost 9 lbs, and is exercising some.  Assessment:     Obesity     Plan:     Rx adipex 37.5 mg #30 1 daily no refills Continue weight loss efforts  Follow up in 4 weeks

## 2014-06-25 ENCOUNTER — Encounter: Payer: Self-pay | Admitting: Adult Health

## 2014-06-25 ENCOUNTER — Ambulatory Visit (INDEPENDENT_AMBULATORY_CARE_PROVIDER_SITE_OTHER): Payer: Federal, State, Local not specified - PPO | Admitting: Adult Health

## 2014-06-25 VITALS — BP 138/90 | Ht 67.25 in | Wt 206.5 lb

## 2014-06-25 DIAGNOSIS — E669 Obesity, unspecified: Secondary | ICD-10-CM

## 2014-06-25 MED ORDER — PHENTERMINE HCL 37.5 MG PO CAPS: 37.5000 mg | ORAL_CAPSULE | ORAL | Status: DC

## 2014-06-25 NOTE — Progress Notes (Signed)
Subjective:     Patient ID: Caroline Wong, female   DOB: 1960-08-14, 54 y.o.   MRN: [id number]  HPI Caroline Wong is a 54 year old white female in for weight and BP check, doing good, no complaints, is having foot surgery 1/26 on left foot.  Review of Systems See HPI Reviewed past medical,surgical, social and family history. Reviewed medications and allergies.     Objective:   Physical Exam BP 138/90 mmHg  Ht 5' 7.25" (1.708 m)  Wt 206 lb 8 oz (93.668 kg)  BMI 32.11 kg/m2  LMP 06/21/2014 Skin warm and dry.  Lungs: clear to ausculation bilaterally. Cardiovascular: regular rate and rhythm.Has lost 7.5 lbs more for total of 16.5 lbs, has been getting up wood this morning.Happy with results.    Assessment:     Obesity     Plan:     Refilled adipex 37.5 mg #30 1 daily no refills Return in 4 weeks for recheck Continue weight loss efforts

## 2014-06-25 NOTE — Patient Instructions (Signed)
Continue weight loss Recheck in 4 weeks

## 2014-07-01 ENCOUNTER — Encounter: Payer: Self-pay | Admitting: Podiatry

## 2014-07-01 DIAGNOSIS — D492 Neoplasm of unspecified behavior of bone, soft tissue, and skin: Secondary | ICD-10-CM

## 2014-07-03 NOTE — Progress Notes (Signed)
DOS 07/01/2014 left plantar fibroma removal from bottom of the foot performed by Dr. Paulla Dolly.

## 2014-07-04 ENCOUNTER — Telehealth: Payer: Self-pay | Admitting: *Deleted

## 2014-07-04 NOTE — Telephone Encounter (Signed)
Pt states she is doing fine after her surgery, and asked if she could ride with her husband while he ran errands tomorrow.  I informed pt if she was going to ride she should not dangle the surgery foot and should ride in a wheelchair if out, to keep from stressing the plantar suture line.  Pt states she'll stay home.

## 2014-07-08 ENCOUNTER — Encounter: Payer: Self-pay | Admitting: Podiatry

## 2014-07-08 ENCOUNTER — Ambulatory Visit (INDEPENDENT_AMBULATORY_CARE_PROVIDER_SITE_OTHER): Payer: Federal, State, Local not specified - PPO | Admitting: Podiatry

## 2014-07-08 VITALS — BP 149/94 | HR 94 | Resp 16

## 2014-07-08 DIAGNOSIS — M79673 Pain in unspecified foot: Secondary | ICD-10-CM

## 2014-07-08 DIAGNOSIS — M722 Plantar fascial fibromatosis: Secondary | ICD-10-CM

## 2014-07-08 NOTE — Progress Notes (Signed)
Subjective:     Patient ID: Caroline Wong, female   DOB: Jul 20, 1960, 54 y.o.   MRN: [id number]  HPI patient states I'm doing fine but I cannot wear this big boot at nighttime as it's too heavy and I'm not sleeping well   Review of Systems     Objective:   Physical Exam One week after surgery left with incision site healing very well plantar left with wound edges well coapted minimal edema and no drainage noted    Assessment:     Healing well from plantar fibroma excision left    Plan:     H&P and condition reviewed and sterile dressing reapplied. Continued with stretching and I did dispensed night splint for nighttime usage due to the importance of keeping the incision site stretched. Patient will be seen back 2 weeks for stitch removal or earlier if necessary

## 2014-07-23 ENCOUNTER — Ambulatory Visit (INDEPENDENT_AMBULATORY_CARE_PROVIDER_SITE_OTHER): Payer: Federal, State, Local not specified - PPO | Admitting: Adult Health

## 2014-07-23 ENCOUNTER — Encounter: Payer: Self-pay | Admitting: Adult Health

## 2014-07-23 VITALS — BP 130/80 | Ht 67.25 in | Wt 207.0 lb

## 2014-07-23 DIAGNOSIS — E669 Obesity, unspecified: Secondary | ICD-10-CM

## 2014-07-23 MED ORDER — PHENTERMINE HCL 37.5 MG PO CAPS: 37.5000 mg | ORAL_CAPSULE | ORAL | Status: DC

## 2014-07-23 NOTE — Patient Instructions (Signed)
Follow up prn

## 2014-07-23 NOTE — Progress Notes (Signed)
Subjective:     Patient ID: Caroline Wong, female   DOB: 1961-03-08, 54 y.o.   MRN: [id number]  HPI Caroline Wong is a 54 year old white female in for weight and BP check.  Review of Systems  Patient denies any headaches, hearing loss, fatigue, blurred vision, shortness of breath, chest pain, abdominal pain, problems with bowel movements, urination, or intercourse. No joint pain or mood swings. Reviewed past medical,surgical, social and family history. Reviewed medications and allergies.     Objective:   Physical Exam BP 130/80 mmHg  Ht 5' 7.25" (1.708 m)  Wt 207 lb (93.895 kg)  BMI 32.19 kg/m2  LMP 06/21/2014   Skin warm and dry.  Lungs: clear to ausculation bilaterally. Cardiovascular: regular rate and rhythm.Has not lost any had foot surgery and has boot on, walking with cane, but did not gain.Encouraged to keep trying to eat better and exercise when she can.  Assessment:     Obesity     Plan:     Rx adipex 37.5 mg #30 1 daily no refills F/U next month with physical

## 2014-07-24 ENCOUNTER — Ambulatory Visit: Payer: Federal, State, Local not specified - PPO | Admitting: Podiatry

## 2014-07-24 DIAGNOSIS — M722 Plantar fascial fibromatosis: Secondary | ICD-10-CM

## 2014-07-28 NOTE — Progress Notes (Signed)
Subjective:     Patient ID: Caroline Wong, female   DOB: 09/23/1960, 54 y.o.   MRN: [id number]  HPI patient presents stating that she is doing well with her foot and that she's walking without much discomfort and that her stitches are still in place   Review of Systems     Objective:   Physical Exam Neurovascular status intact with no health history changes noted and noted to have good alignment of the digit with stitches in place and wound edges well coapted    Assessment:     Doing well post surgery    Plan:     Stitches are removed wound edges well coapted and advised this patient on continued immobilization for the next several weeks and reappoint to recheck

## 2014-08-21 ENCOUNTER — Encounter: Payer: Self-pay | Admitting: Podiatry

## 2014-08-22 ENCOUNTER — Encounter: Payer: Self-pay | Admitting: Podiatry

## 2014-08-25 ENCOUNTER — Encounter: Payer: Self-pay | Admitting: Podiatry

## 2014-08-26 ENCOUNTER — Other Ambulatory Visit: Payer: Federal, State, Local not specified - PPO | Admitting: Adult Health

## 2014-08-27 ENCOUNTER — Ambulatory Visit (INDEPENDENT_AMBULATORY_CARE_PROVIDER_SITE_OTHER): Payer: Federal, State, Local not specified - PPO | Admitting: Adult Health

## 2014-08-27 ENCOUNTER — Encounter: Payer: Self-pay | Admitting: Adult Health

## 2014-08-27 VITALS — BP 146/92 | HR 72 | Ht 67.25 in | Wt 215.0 lb

## 2014-08-27 DIAGNOSIS — Z79890 Hormone replacement therapy (postmenopausal): Secondary | ICD-10-CM

## 2014-08-27 DIAGNOSIS — Z01419 Encounter for gynecological examination (general) (routine) without abnormal findings: Secondary | ICD-10-CM | POA: Diagnosis not present

## 2014-08-27 DIAGNOSIS — Z1212 Encounter for screening for malignant neoplasm of rectum: Secondary | ICD-10-CM | POA: Diagnosis not present

## 2014-08-27 LAB — HEMOCCULT GUIAC POC 1CARD (OFFICE): FECAL OCCULT BLD: NEGATIVE

## 2014-08-27 MED ORDER — LORAZEPAM 0.5 MG PO TABS: 0.5000 mg | ORAL_TABLET | Freq: Every day | ORAL | Status: DC

## 2014-08-27 NOTE — Patient Instructions (Signed)
Pap and physical in 1 year Mammogram yearly Colonoscopy per GI  

## 2014-08-27 NOTE — Progress Notes (Signed)
Patient ID: Caroline Wong, female   DOB: [birth year], 54 y.o.   MRN: [id number] History of Present Illness: Caroline Wong is a 54 year old white female, married in for well woman gyn exam.She had a normal pap with negative HPV 08/15/12.She had been on adipex and had lost about 8 lbs then had foot surgery and stopped it,but has started back. She had colonoscopy 10/15 and is good for 10 years.  Current Medications, Allergies, Past Medical History, Past Surgical History, Family History and Social History were reviewed in [employer] record.     Review of Systems: Patient denies any headaches, hearing loss, fatigue, blurred vision, shortness of breath, chest pain, abdominal pain, problems with bowel movements, urination, or intercourse. No joint pain or mood swings.She takes ativan at bedtime and needs refill. PCP Dr Jenny Reichmann at [facility].   Physical Exam:BP 146/92 mmHg  Pulse 72  Ht 5' 7.25" (1.708 m)  Wt 215 lb (97.523 kg)  BMI 33.43 kg/m2  LMP 06/21/2014 General:  Well developed, well nourished, no acute distress Skin:  Warm and dry Neck:  Midline trachea, normal thyroid, good ROM, no lymphadenopathy Lungs; Clear to auscultation bilaterally Breast: Deferred, had recent normal mammogram at [facility] Cardiovascular: Regular rate and rhythm Abdomen:  Soft, non tender, no hepatosplenomegaly Pelvic:  External genitalia is normal in appearance, no lesions.  The vagina is normal in appearance, has good color and moisture. Urethra has no lesions or masses. The cervix is bulbous.  Uterus is felt to be normal size, shape, and contour.  No adnexal masses or tenderness noted.Bladder is non tender, no masses felt. Rectal: Good sphincter tone, no polyps, or hemorrhoids felt.  Hemoccult negative. Extremities/musculoskeletal:  No swelling or varicosities noted, no clubbing or cyanosis, has healing scar left foot Psych:  No mood changes, alert and cooperative,seems  happy   Impression:  Well woman gyn exam no pap HRT   Plan: Refilled ativan 0.5 mg #30 1 at hs prn with 3 refills Continue prometrium and estradiol gel has refills Pap and physical in 1 year Mammogram yearly Colonoscopy per GI

## 2014-08-29 ENCOUNTER — Other Ambulatory Visit: Payer: Self-pay | Admitting: Adult Health

## 2014-09-01 ENCOUNTER — Ambulatory Visit (INDEPENDENT_AMBULATORY_CARE_PROVIDER_SITE_OTHER): Payer: Federal, State, Local not specified - PPO | Admitting: Podiatry

## 2014-09-01 DIAGNOSIS — M722 Plantar fascial fibromatosis: Secondary | ICD-10-CM

## 2014-09-02 NOTE — Progress Notes (Signed)
Subjective:     Patient ID: Caroline Wong, female   DOB: 1960-12-22, 54 y.o.   MRN: [id number]  HPI patient states I'm doing pretty well with my left I'm back to work light duty with occasional discomfort   Review of Systems     Objective:   Physical Exam Neurovascular status intact muscle strength adequate with well-healing surgical site left plantar arch with mild pain in the heel    Assessment:     Improving plantar fibroma with mild fasciitis-like symptoms right over left    Plan:     Reviewed condition and physical therapy to undertake for the right one and continued gradual increase in activity on the left. Reappoint to recheck

## 2014-09-10 ENCOUNTER — Encounter: Payer: Self-pay | Admitting: Adult Health

## 2014-09-11 ENCOUNTER — Other Ambulatory Visit: Payer: Self-pay | Admitting: Adult Health

## 2014-09-11 ENCOUNTER — Telehealth: Payer: Self-pay | Admitting: Adult Health

## 2014-09-12 MED ORDER — PHENTERMINE HCL 37.5 MG PO CAPS: 37.5000 mg | ORAL_CAPSULE | ORAL | Status: DC

## 2014-09-12 NOTE — Telephone Encounter (Signed)
Will refill adipex x 1

## 2014-10-05 ENCOUNTER — Encounter (HOSPITAL_COMMUNITY): Payer: Self-pay

## 2014-10-05 ENCOUNTER — Emergency Department (HOSPITAL_COMMUNITY)
Admission: EM | Admit: 2014-10-05 | Discharge: 2014-10-05 | Disposition: A | Discharge status: Home / Self Care | Payer: Federal, State, Local not specified - PPO | Attending: Emergency Medicine | Admitting: Emergency Medicine

## 2014-10-05 DIAGNOSIS — Y9389 Activity, other specified: Secondary | ICD-10-CM | POA: Insufficient documentation

## 2014-10-05 DIAGNOSIS — Y998 Other external cause status: Secondary | ICD-10-CM | POA: Diagnosis not present

## 2014-10-05 DIAGNOSIS — S39012A Strain of muscle, fascia and tendon of lower back, initial encounter: Secondary | ICD-10-CM | POA: Diagnosis not present

## 2014-10-05 DIAGNOSIS — Y92410 Unspecified street and highway as the place of occurrence of the external cause: Secondary | ICD-10-CM | POA: Diagnosis not present

## 2014-10-05 DIAGNOSIS — Z8719 Personal history of other diseases of the digestive system: Secondary | ICD-10-CM | POA: Diagnosis not present

## 2014-10-05 DIAGNOSIS — Z79899 Other long term (current) drug therapy: Secondary | ICD-10-CM | POA: Diagnosis not present

## 2014-10-05 DIAGNOSIS — S134XXA Sprain of ligaments of cervical spine, initial encounter: Secondary | ICD-10-CM | POA: Diagnosis not present

## 2014-10-05 DIAGNOSIS — Z8619 Personal history of other infectious and parasitic diseases: Secondary | ICD-10-CM | POA: Diagnosis not present

## 2014-10-05 DIAGNOSIS — F419 Anxiety disorder, unspecified: Secondary | ICD-10-CM | POA: Insufficient documentation

## 2014-10-05 DIAGNOSIS — I10 Essential (primary) hypertension: Secondary | ICD-10-CM | POA: Insufficient documentation

## 2014-10-05 DIAGNOSIS — Z8709 Personal history of other diseases of the respiratory system: Secondary | ICD-10-CM | POA: Insufficient documentation

## 2014-10-05 DIAGNOSIS — S3992XA Unspecified injury of lower back, initial encounter: Secondary | ICD-10-CM | POA: Diagnosis present

## 2014-10-05 MED ORDER — TRAMADOL HCL 50 MG PO TABS: 50.0000 mg | ORAL_TABLET | Freq: Four times a day (QID) | ORAL | Status: DC | PRN

## 2014-10-05 MED ORDER — CYCLOBENZAPRINE HCL 5 MG PO TABS: 5.0000 mg | ORAL_TABLET | Freq: Three times a day (TID) | ORAL | Status: DC | PRN

## 2014-10-05 MED ORDER — LIDOCAINE 5 % EX PTCH: 1.0000 | MEDICATED_PATCH | CUTANEOUS | Status: DC

## 2014-10-05 MED ORDER — NAPROXEN 500 MG PO TABS: 500.0000 mg | ORAL_TABLET | Freq: Two times a day (BID) | ORAL | Status: DC
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
## 2014-10-05 NOTE — Discharge Instructions (Signed)

## 2014-10-08 ENCOUNTER — Encounter: Payer: Self-pay | Admitting: Internal Medicine

## 2014-10-08 ENCOUNTER — Ambulatory Visit (INDEPENDENT_AMBULATORY_CARE_PROVIDER_SITE_OTHER): Payer: Federal, State, Local not specified - PPO | Admitting: Internal Medicine

## 2014-10-08 VITALS — BP 128/84 | HR 68 | Temp 98.3°F | Resp 18 | Ht 67.25 in | Wt 211.0 lb

## 2014-10-08 DIAGNOSIS — I10 Essential (primary) hypertension: Secondary | ICD-10-CM | POA: Diagnosis not present

## 2014-10-08 DIAGNOSIS — M549 Dorsalgia, unspecified: Secondary | ICD-10-CM | POA: Diagnosis not present

## 2014-10-08 MED ORDER — TRAMADOL HCL 50 MG PO TABS: 50.0000 mg | ORAL_TABLET | Freq: Four times a day (QID) | ORAL | Status: DC | PRN

## 2014-10-08 MED ORDER — AMLODIPINE BESY-BENAZEPRIL HCL 10-20 MG PO CAPS: 1.0000 | ORAL_CAPSULE | Freq: Every day | ORAL | Status: DC
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
## 2014-10-08 NOTE — Patient Instructions (Signed)
Please take all new medication as prescribed - the tramadol  Please continue all other medications as before, including the muscle relaxer as needed  Please have the pharmacy call with any other refills you may need.  Please keep your appointments with your specialists as you may have planned

## 2014-10-08 NOTE — Assessment & Plan Note (Signed)
stable overall by history and exam, recent data reviewed with pt, and pt to continue medical treatment as before,  to f/u any worsening symptoms or concerns BP Readings from Last 3 Encounters:  10/08/14 128/84  10/05/14 178/97  08/27/14 146/92

## 2014-10-15 ENCOUNTER — Encounter: Payer: Self-pay | Admitting: Adult Health

## 2014-10-15 ENCOUNTER — Ambulatory Visit (INDEPENDENT_AMBULATORY_CARE_PROVIDER_SITE_OTHER): Payer: Federal, State, Local not specified - PPO | Admitting: Adult Health

## 2014-10-15 VITALS — BP 132/80 | HR 64 | Ht 67.25 in | Wt 210.5 lb

## 2014-10-15 DIAGNOSIS — E669 Obesity, unspecified: Secondary | ICD-10-CM | POA: Diagnosis not present

## 2014-10-15 MED ORDER — PHENTERMINE HCL 37.5 MG PO CAPS: 37.5000 mg | ORAL_CAPSULE | ORAL | Status: DC
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
## 2014-10-15 NOTE — Patient Instructions (Signed)
Continue weight loss efforts Take adipex 1 daily  Increase exercise Follow up in 4 weeks

## 2014-10-27 ENCOUNTER — Other Ambulatory Visit: Payer: Self-pay | Admitting: Adult Health

## 2014-11-12 ENCOUNTER — Ambulatory Visit (INDEPENDENT_AMBULATORY_CARE_PROVIDER_SITE_OTHER): Payer: Federal, State, Local not specified - PPO | Admitting: Adult Health

## 2014-11-12 ENCOUNTER — Encounter: Payer: Self-pay | Admitting: Adult Health

## 2014-11-12 VITALS — BP 126/84 | HR 72 | Ht 67.25 in | Wt 208.0 lb

## 2014-11-12 DIAGNOSIS — Z713 Dietary counseling and surveillance: Secondary | ICD-10-CM

## 2014-11-12 DIAGNOSIS — Z6832 Body mass index (BMI) 32.0-32.9, adult: Secondary | ICD-10-CM

## 2014-11-12 DIAGNOSIS — E669 Obesity, unspecified: Secondary | ICD-10-CM | POA: Diagnosis not present

## 2014-11-12 MED ORDER — PHENTERMINE HCL 37.5 MG PO CAPS: 37.5000 mg | ORAL_CAPSULE | ORAL | Status: DC

## 2014-11-12 NOTE — Progress Notes (Signed)
Subjective:     Patient ID: Caroline Wong, female   DOB: 1961/04/07, 54 y.o.   MRN: [id number]  HPI Caroline Wong is a 54 year old white female in for weight and BP check, has been taking adipex to help with weigh loss.  Review of Systems Patient denies any headaches, hearing loss, fatigue, blurred vision, shortness of breath, chest pain, abdominal pain, problems with bowel movements, urination, or intercourse. No joint pain or mood swings. Reviewed past medical,surgical, social and family history. Reviewed medications and allergies.     Objective:   Physical Exam BP 126/84 mmHg  Pulse 72  Ht 5' 7.25" (1.708 m)  Wt 208 lb (94.348 kg)  BMI 32.34 kg/m2  LMP 11/06/2014 Skin warm and dry. Lungs: clear to ausculation bilaterally. Cardiovascular: regular rate and rhythm. Discussed trying to eat more fresh foods and increase exercise, has lost about 7 lbs total.    Assessment:     Weight loss counseling Obesity     Plan:     Rx adipex 37.5 mg #30 take 1 daily no refills Follow up in 4 weeks for weight and BP check

## 2014-11-12 NOTE — Patient Instructions (Signed)
Continue weight loss Follow up in 4 weeks

## 2014-12-10 ENCOUNTER — Ambulatory Visit (INDEPENDENT_AMBULATORY_CARE_PROVIDER_SITE_OTHER): Payer: Federal, State, Local not specified - PPO | Admitting: Adult Health

## 2014-12-10 ENCOUNTER — Encounter: Payer: Self-pay | Admitting: Adult Health

## 2014-12-10 VITALS — BP 124/82 | HR 72 | Ht 67.25 in | Wt 206.5 lb

## 2014-12-10 DIAGNOSIS — Z713 Dietary counseling and surveillance: Secondary | ICD-10-CM | POA: Diagnosis not present

## 2014-12-10 DIAGNOSIS — Z6832 Body mass index (BMI) 32.0-32.9, adult: Secondary | ICD-10-CM | POA: Diagnosis not present

## 2014-12-10 MED ORDER — PHENTERMINE HCL 37.5 MG PO CAPS: 37.5000 mg | ORAL_CAPSULE | ORAL | Status: DC
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
## 2014-12-10 NOTE — Patient Instructions (Signed)
Continue weight loss efforts Follow up in 4 weeks

## 2014-12-22 ENCOUNTER — Other Ambulatory Visit: Payer: Self-pay | Admitting: Obstetrics & Gynecology

## 2014-12-30 ENCOUNTER — Telehealth: Payer: Self-pay | Admitting: Internal Medicine

## 2014-12-30 NOTE — Telephone Encounter (Signed)
Ok with me 

## 2014-12-30 NOTE — Telephone Encounter (Signed)
Patient is requesting to transfer from [place] to [place].  Dr. Alain Marion has already gave ok to take on as patient.

## 2014-12-31 ENCOUNTER — Encounter: Payer: Self-pay | Admitting: Internal Medicine

## 2014-12-31 ENCOUNTER — Ambulatory Visit (INDEPENDENT_AMBULATORY_CARE_PROVIDER_SITE_OTHER): Payer: Federal, State, Local not specified - PPO | Admitting: Internal Medicine

## 2014-12-31 VITALS — BP 126/84 | HR 73 | Temp 97.8°F | Wt 204.8 lb

## 2014-12-31 DIAGNOSIS — M5441 Lumbago with sciatica, right side: Secondary | ICD-10-CM

## 2014-12-31 DIAGNOSIS — M542 Cervicalgia: Secondary | ICD-10-CM

## 2014-12-31 DIAGNOSIS — M545 Low back pain, unspecified: Secondary | ICD-10-CM | POA: Insufficient documentation

## 2014-12-31 DIAGNOSIS — I10 Essential (primary) hypertension: Secondary | ICD-10-CM
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
## 2014-12-31 NOTE — Telephone Encounter (Signed)
Has appointment today with Dr. Jenny Reichmann.  Will make next follow up with Dr. Alain Marion.

## 2014-12-31 NOTE — Progress Notes (Signed)
Pre visit review using our clinic review tool, if applicable. No additional management support is needed unless otherwise documented below in the visit note. 
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
## 2014-12-31 NOTE — Assessment & Plan Note (Signed)
stable overall by history and exam, recent data reviewed with pt, and pt to continue medical treatment as before,  to f/u any worsening symptoms or concerns BP Readings from Last 3 Encounters:  12/31/14 126/84  12/10/14 124/82  11/12/14 126/84

## 2014-12-31 NOTE — Assessment & Plan Note (Signed)
?   MSK strain with underlying DJD or DDD, cont nsaid prn,  to f/u any worsening symptoms or concerns

## 2014-12-31 NOTE — Patient Instructions (Signed)
Your exam is documented today  Please continue all other medications as before, and refills have been done if requested.  Please have the pharmacy call with any other refills you may need.  Please keep your appointments with your specialists as you may have planned - orthopedic tomorrow

## 2015-01-07 ENCOUNTER — Ambulatory Visit: Payer: Federal, State, Local not specified - PPO | Admitting: Adult Health

## 2015-01-21 ENCOUNTER — Ambulatory Visit: Payer: Federal, State, Local not specified - PPO | Admitting: Internal Medicine

## 2015-01-28 ENCOUNTER — Ambulatory Visit: Payer: Federal, State, Local not specified - PPO | Admitting: Internal Medicine

## 2015-02-04 ENCOUNTER — Ambulatory Visit (INDEPENDENT_AMBULATORY_CARE_PROVIDER_SITE_OTHER): Payer: Federal, State, Local not specified - PPO | Admitting: Adult Health

## 2015-02-04 ENCOUNTER — Encounter: Payer: Self-pay | Admitting: Adult Health

## 2015-02-04 VITALS — BP 132/90 | HR 60 | Ht 67.5 in | Wt 201.0 lb

## 2015-02-04 DIAGNOSIS — Z6831 Body mass index (BMI) 31.0-31.9, adult: Secondary | ICD-10-CM

## 2015-02-04 DIAGNOSIS — Z713 Dietary counseling and surveillance: Secondary | ICD-10-CM

## 2015-02-04 MED ORDER — PHENTERMINE HCL 37.5 MG PO CAPS: 37.5000 mg | ORAL_CAPSULE | ORAL | Status: DC

## 2015-02-04 NOTE — Patient Instructions (Signed)
Continue weight loss efforts Follow up in 4 weeks 

## 2015-02-04 NOTE — Progress Notes (Signed)
Subjective:     Patient ID: Caroline Wong, female   DOB: [birth year], 54 y.o.   MRN: [id number]  HPI Caroline Wong is a 54 year old white female married in for weight and BP check, she has lost 4.75 more pounds for total of about 14 since March.  Review of Systems Patient denies any  hearing loss, fatigue, blurred vision, shortness of breath, chest pain, abdominal pain, problems with bowel movements, urination, or intercourse. No joint pain or mood swings.has headaches and neck pain and is seeing Dr Lorin Mercy.  Reviewed past medical,surgical, social and family history. Reviewed medications and allergies.     Objective:   Physical Exam BP 132/90 mmHg  Pulse 60  Ht 5' 7.5" (1.715 m)  Wt 201 lb (91.173 kg)  BMI 31.00 kg/m2  LMP 01/24/2015 Skin warm and dry.  Lungs: clear to ausculation bilaterally. Cardiovascular: regular rate and rhythm, she was below 200 at home, eating cleaner.    Assessment:     Weight loss counseling BMI 31    Plan:     Rx adipex 37.5 mg #30 take 1 daily Follow up in 4 weeks Continue weight loss efforts, and increase walking

## 2015-02-11 ENCOUNTER — Ambulatory Visit (HOSPITAL_COMMUNITY): Payer: Federal, State, Local not specified - PPO | Attending: Orthopaedic Surgery | Admitting: Physical Therapy

## 2015-02-11 DIAGNOSIS — M542 Cervicalgia: Secondary | ICD-10-CM | POA: Diagnosis present

## 2015-02-11 DIAGNOSIS — G8929 Other chronic pain: Secondary | ICD-10-CM | POA: Insufficient documentation

## 2015-02-11 DIAGNOSIS — M6248 Contracture of muscle, other site: Secondary | ICD-10-CM | POA: Insufficient documentation

## 2015-02-11 DIAGNOSIS — G44221 Chronic tension-type headache, intractable: Secondary | ICD-10-CM | POA: Diagnosis present

## 2015-02-11 DIAGNOSIS — M436 Torticollis: Secondary | ICD-10-CM | POA: Insufficient documentation

## 2015-02-11 DIAGNOSIS — M62838 Other muscle spasm: Secondary | ICD-10-CM
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
## 2015-02-11 NOTE — Patient Instructions (Addendum)
Strengthening: Shoulder Shrug (Phase 1)   Shrug shoulders up and down, forward and backward. Repeat _5-10___ times per set. Do ___1_ sets per session. Do 2____ sessions per day.  http://orth.exer.us/336   Copyright  VHI. All rights reserved.  Strengthening: Lateral Bend - Isometric (in Neutral)   Using light pressure from fingertips, press into right temple. Resist bending head sideways. Hold ___3_ seconds. Repeat 10____ times per set. Do ___1_ sets per session. Do ___2_ sessions per day.  http://orth.exer.us/302   Copyright  VHI. All rights reserved.  Strengthening: Extension - Isometric (in Neutral)   Using light pressure from fingertips at back of head, resist bending head backward. Hold __3__ seconds. Repeat __10__ times per set. Do ___1_ sets per session. Do 2____ sessions per day.  http://orth.exer.us/308     Tighten stomach and slowly raise right leg __3__ inches from floor. Keep trunk rigid. Hold ___2_ seconds. Repeat ____10 times per set. Do 1____ sets per session. Do _2___ sessions per day.  http://orth.exer.us/1090   Copyright  VHI. All rights reserved.

## 2015-02-12 ENCOUNTER — Ambulatory Visit (HOSPITAL_COMMUNITY): Payer: Federal, State, Local not specified - PPO | Admitting: Physical Therapy

## 2015-02-12 DIAGNOSIS — M436 Torticollis: Secondary | ICD-10-CM

## 2015-02-12 DIAGNOSIS — M542 Cervicalgia: Secondary | ICD-10-CM

## 2015-02-12 DIAGNOSIS — M62838 Other muscle spasm: Secondary | ICD-10-CM

## 2015-02-12 DIAGNOSIS — G8929 Other chronic pain: Secondary | ICD-10-CM

## 2015-02-12 DIAGNOSIS — G44221 Chronic tension-type headache, intractable: Secondary | ICD-10-CM

## 2015-02-12 NOTE — Therapy (Signed)
[place], [place], [address] Phone: [phone]   Fax:  [phone]  Physical Therapy Treatment  Patient Details  Name: Caroline Wong MRN: [id number] Date of Birth: Jul 30, 1960 Referring Provider:  Biagio Borg, MD  Encounter Date: 02/12/2015      PT End of Session - 02/12/15 1700    Visit Number 2   Number of Visits 12   Date for PT Re-Evaluation 03/13/15   Authorization Type BCBS   PT Start Time 1652   PT Stop Time 4656   PT Time Calculation (min) 53 min      Past Medical History  Diagnosis Date  . Chronic bronchitis   . Hypertension   . Hormone replacement therapy (HRT)   . Abdominal pain 11/25/2013    Has some nausea associated with pain that has been on and off x 2 weeks will get [place]  . Gallstones   . Anxiety   . Night sweats 04/14/2014  . Yeast infection 05/07/2014  . Obesity 05/07/2014    Past Surgical History  Procedure Laterality Date  . Wisdom tooth extraction    . Mole removal    . Cholecystectomy    . Foot surgery Left     There were no vitals filed for this visit.  Visit Diagnosis:  Neck stiffness  Neck pain of over 3 months duration  Muscle spasms of neck  Chronic tension-type headache, intractable      Subjective Assessment - 02/12/15 1656    Subjective PT states she was hurting earlier 8/10 and took a pain pill to take the edge off.  Currently 2/10.   Currently in Pain? Yes   Pain Score 2    Pain Orientation Left                         [facility] Adult PT Treatment/Exercise - 02/12/15 1701    Neck Exercises: Seated   Cervical Isometrics Extension;Right lateral flexion;Left lateral flexion;5 reps   X to V 10 reps   X to V Weights (lbs) 10 reps   Shoulder Shrugs 10 reps   Other Seated Exercise 3 D cervical and thoracic excursion with good cervical motion   Manual Therapy   Manual Therapy Soft tissue mobilization;Myofascial release   Manual therapy  comments seated to cervical region   Neck Exercises: Stretches   Upper Trapezius Stretch 2 reps;30 seconds   Levator Stretch 2 reps;30 seconds                PT Education - 02/11/15 1139    Education provided Yes   Education Details cervical and thoracic 3-D excursion as well as cervical isometric    Person(s) Educated Patient   Methods Explanation;Demonstration;Handout   Comprehension Verbalized understanding;Returned demonstration          PT Short Term Goals - 02/11/15 1200    PT SHORT TERM GOAL #1   Title I HEP   Time 3   Period Days   PT SHORT TERM GOAL #2   Title Pt cervical ROM is wnl   Time 2   Period Weeks   PT SHORT TERM GOAL #3   Title Pt strength is improved by 1/2 grade to allow her pain to be at the most a 6/10   Time 2   Period Weeks   PT SHORT TERM GOAL #4   Title Pt to understand and verbalize the improtance of posture in cervical  pain and headaches.   Time 2   Period Weeks   PT SHORT TERM GOAL #5   Title Pt states that her H/A have decreased by 50%   Time 2   Period Weeks           PT Long Term Goals - 02/11/15 1201    PT LONG TERM GOAL #1   Title I advanced HEP   Time 4   Period Weeks   PT LONG TERM GOAL #2   Title Pt strength to be improved by one grade to allow pt pain to be at the most a 3/10    Time 4   Period Weeks   PT LONG TERM GOAL #3   Title Pt to have mild to no mm spasm on Lt upper trap; no mm spasm Rt    Time 4   Period Weeks   PT LONG TERM GOAL #4   Title Pt to be able to have full ROM of shoulder with no pain or difficultty.   Time 4   Period Weeks   PT LONG TERM GOAL #5   Title Pt to have had no headache  in the past week    Time 4   Period Weeks               Plan - 02/12/15 1800    Clinical Impression Statement Pt with several knots/spasms palpated in LT upper trap and rhomboids.  able to reduce 80% with overall reduction of pain and increase in mobility.  Pt able to recall all exericses in HEP  independently.  Added 2 cervical stretches and postural stability exericses per POC.     PT Next Visit Plan begin money exercise and UBE.  Can add postural 3 if pain is reduced.        Problem List Patient Active Problem List   Diagnosis Date Noted  . Neck pain, bilateral posterior 12/31/2014  . Lower back pain 12/31/2014  . Yeast infection 05/07/2014  . Obesity 05/07/2014  . Night sweats 04/14/2014  . Symptomatic cholelithiasis 12/12/2013  . Abdominal pain 11/25/2013  . Hormone replacement therapy (HRT) 08/21/2013  . Preventative health care 05/16/2013  . Hypertension   . Chronic bronchitis     Teena Irani, PTA/CLT [phone]  02/12/2015, 6:10 PM  [facility] [address] [place], [place], [address] Phone: [phone]   Fax:  [phone]

## 2015-02-17 ENCOUNTER — Ambulatory Visit (HOSPITAL_COMMUNITY): Payer: Federal, State, Local not specified - PPO | Admitting: Physical Therapy

## 2015-02-17 DIAGNOSIS — M436 Torticollis: Secondary | ICD-10-CM | POA: Diagnosis not present

## 2015-02-17 DIAGNOSIS — M62838 Other muscle spasm: Secondary | ICD-10-CM

## 2015-02-17 DIAGNOSIS — M542 Cervicalgia: Secondary | ICD-10-CM

## 2015-02-17 DIAGNOSIS — G44221 Chronic tension-type headache, intractable: Secondary | ICD-10-CM

## 2015-02-17 DIAGNOSIS — G8929 Other chronic pain: Secondary | ICD-10-CM

## 2015-02-17 NOTE — Therapy (Signed)
[facility] [place], [place], [address] Phone: [phone]   Fax:  [phone]  Physical Therapy Treatment  Patient Details  Name: Caroline Wong MRN: [id number] Date of Birth: 1961-03-17 Referring Provider:  [name], MD  Encounter Date: 02/17/2015      PT End of Session - 02/17/15 1812    Visit Number 3   Number of Visits 12   Date for PT Re-Evaluation 03/13/15   Authorization Type BCBS   PT Start Time 1650   PT Stop Time 1750   PT Time Calculation (min) 60 min   Activity Tolerance Patient tolerated treatment well   Behavior During Therapy [facility] for tasks assessed/performed      Past Medical History  Diagnosis Date  . Chronic bronchitis   . Hypertension   . Hormone replacement therapy (HRT)   . Abdominal pain 11/25/2013    Has some nausea associated with pain that has been on and off x 2 weeks will get [place]  . Gallstones   . Anxiety   . Night sweats 04/14/2014  . Yeast infection 05/07/2014  . Obesity 05/07/2014    Past Surgical History  Procedure Laterality Date  . Wisdom tooth extraction    . Mole removal    . Cholecystectomy    . Foot surgery Left     There were no vitals filed for this visit.  Visit Diagnosis:  Neck stiffness  Neck pain of over 3 months duration  Muscle spasms of neck  Chronic tension-type headache, intractable      Subjective Assessment - 02/17/15 1811    Subjective Pt states she didnt have pain for 4 days following last session then pain returned into her Lt cervical and down her Lt UE.  States she's also been having headaches.   Currently in Pain? Yes   Pain Score 2                          [facility] Adult PT Treatment/Exercise - 02/17/15 1810    Neck Exercises: Seated   X to V 10 reps   W Back Limitations 10 reps   Shoulder Shrugs 10 reps   Modalities   Modalities Ultrasound   Ultrasound   Ultrasound Location Lt upper trap and rhomboid   Ultrasound  Parameters 1.4w/cm2 continous   Ultrasound Goals Pain   Manual Therapy   Manual Therapy Soft tissue mobilization;Myofascial release   Manual therapy comments seated to cervical region   Neck Exercises: Stretches   Upper Trapezius Stretch 2 reps;30 seconds   Levator Stretch 2 reps;30 seconds                  PT Short Term Goals - 02/11/15 1200    PT SHORT TERM GOAL #1   Title I HEP   Time 3   Period Days   PT SHORT TERM GOAL #2   Title Pt cervical ROM is wnl   Time 2   Period Weeks   PT SHORT TERM GOAL #3   Title Pt strength is improved by 1/2 grade to allow her pain to be at the most a 6/10   Time 2   Period Weeks   PT SHORT TERM GOAL #4   Title Pt to understand and verbalize the improtance of posture in cervical pain and headaches.   Time 2   Period Weeks   PT SHORT TERM GOAL #5   Title Pt  states that her H/A have decreased by 50%   Time 2   Period Weeks           PT Long Term Goals - 02/11/15 1201    PT LONG TERM GOAL #1   Title I advanced HEP   Time 4   Period Weeks   PT LONG TERM GOAL #2   Title Pt strength to be improved by one grade to allow pt pain to be at the most a 3/10    Time 4   Period Weeks   PT LONG TERM GOAL #3   Title Pt to have mild to no mm spasm on Lt upper trap; no mm spasm Rt    Time 4   Period Weeks   PT LONG TERM GOAL #4   Title Pt to be able to have full ROM of shoulder with no pain or difficultty.   Time 4   Period Weeks   PT LONG TERM GOAL #5   Title Pt to have had no headache  in the past week    Time 4   Period Weeks               Plan - 02/17/15 1812    Clinical Impression Statement Pt continues with multiple spasms into her Lt upper trap and rhomboids.  No new exericses added.  Added ultrasound to help relax mm spasms prior to soft tissue work . Pt reported reduction in cervical pain and radiating pain down Lt UE, however headache persists.     PT Next Visit Plan Assess pain next session and continue  manual.          Problem List Patient Active Problem List   Diagnosis Date Noted  . Neck pain, bilateral posterior 12/31/2014  . Lower back pain 12/31/2014  . Yeast infection 05/07/2014  . Obesity 05/07/2014  . Night sweats 04/14/2014  . Symptomatic cholelithiasis 12/12/2013  . Abdominal pain 11/25/2013  . Hormone replacement therapy (HRT) 08/21/2013  . Preventative health care 05/16/2013  . Hypertension   . Chronic bronchitis     Teena Irani, PTA/CLT [phone]  02/17/2015, 6:32 PM  [facility] [address] [place], [place], [address] Phone: [phone]   Fax:  [phone]

## 2015-02-18 ENCOUNTER — Ambulatory Visit (HOSPITAL_COMMUNITY): Payer: Federal, State, Local not specified - PPO | Admitting: Physical Therapy

## 2015-02-18 DIAGNOSIS — G8929 Other chronic pain: Secondary | ICD-10-CM

## 2015-02-18 DIAGNOSIS — M62838 Other muscle spasm: Secondary | ICD-10-CM

## 2015-02-18 DIAGNOSIS — M436 Torticollis: Secondary | ICD-10-CM | POA: Diagnosis not present

## 2015-02-18 DIAGNOSIS — G44221 Chronic tension-type headache, intractable: Secondary | ICD-10-CM

## 2015-02-18 DIAGNOSIS — M542 Cervicalgia: Secondary | ICD-10-CM

## 2015-02-18 NOTE — Therapy (Signed)
[place], [place], [address] Phone: [phone]   Fax:  [phone]  Physical Therapy Treatment  Patient Details  Name: Caroline Wong MRN: [id number] Date of Birth: 1961-04-27 Referring Provider:  [name], MD  Encounter Date: 02/18/2015      PT End of Session - 02/18/15 1040    Visit Number 4   Number of Visits 12   Date for PT Re-Evaluation 03/13/15   Authorization Type BCBS   PT Start Time 0845   PT Stop Time 0935   PT Time Calculation (min) 50 min   Activity Tolerance Patient tolerated treatment well   Behavior During Therapy [facility] for tasks assessed/performed      Past Medical History  Diagnosis Date  . Chronic bronchitis   . Hypertension   . Hormone replacement therapy (HRT)   . Abdominal pain 11/25/2013    Has some nausea associated with pain that has been on and off x 2 weeks will get [place]  . Gallstones   . Anxiety   . Night sweats 04/14/2014  . Yeast infection 05/07/2014  . Obesity 05/07/2014    Past Surgical History  Procedure Laterality Date  . Wisdom tooth extraction    . Mole removal    . Cholecystectomy    . Foot surgery Left     There were no vitals filed for this visit.  Visit Diagnosis:  Neck stiffness  Neck pain of over 3 months duration  Muscle spasms of neck  Chronic tension-type headache, intractable      Subjective Assessment - 02/18/15 0851    Subjective Pt states her headache and pain went away last night but had some slignt discomfort this morining.  States she took a tramodol at 3:00 am and is currently painfree.    Currently in Pain? No/denies                         [facility] Adult PT Treatment/Exercise - 02/18/15 1031    Neck Exercises: Machines for Strengthening   UBE (Upper Arm Bike) 2'/2' forward/backward level 1   Neck Exercises: Theraband   Scapula Retraction 10 reps;Green   Shoulder Extension 10 reps;Green   Rows 10 reps;Green   Neck Exercises: Seated   Shoulder Shrugs 10 reps   Modalities   Modalities Ultrasound   Ultrasound   Ultrasound Location Lt upper trap and rhomboid   Ultrasound Parameters 1.4 w/cm2 continuous   Ultrasound Goals Pain   Manual Therapy   Manual Therapy Soft tissue mobilization;Myofascial release   Manual therapy comments seated to cervical region                  PT Short Term Goals - 02/11/15 1200    PT SHORT TERM GOAL #1   Title I HEP   Time 3   Period Days   PT SHORT TERM GOAL #2   Title Pt cervical ROM is wnl   Time 2   Period Weeks   PT SHORT TERM GOAL #3   Title Pt strength is improved by 1/2 grade to allow her pain to be at the most a 6/10   Time 2   Period Weeks   PT SHORT TERM GOAL #4   Title Pt to understand and verbalize the improtance of posture in cervical pain and headaches.   Time 2   Period Weeks   PT SHORT TERM GOAL #5   Title Pt states  that her H/A have decreased by 50%   Time 2   Period Weeks           PT Long Term Goals - 02/11/15 1201    PT LONG TERM GOAL #1   Title I advanced HEP   Time 4   Period Weeks   PT LONG TERM GOAL #2   Title Pt strength to be improved by one grade to allow pt pain to be at the most a 3/10    Time 4   Period Weeks   PT LONG TERM GOAL #3   Title Pt to have mild to no mm spasm on Lt upper trap; no mm spasm Rt    Time 4   Period Weeks   PT LONG TERM GOAL #4   Title Pt to be able to have full ROM of shoulder with no pain or difficultty.   Time 4   Period Weeks   PT LONG TERM GOAL #5   Title Pt to have had no headache  in the past week    Time 4   Period Weeks               Plan - 02/18/15 1041    Clinical Impression Statement Pt without pain today.  Added postural 3 theraband exericses to POC with good form and no difficulty. continued with [place] and soft tissue work to decrease spasm and pain.  Able to resolve all mm spasms today by end of session.   PT Next Visit Plan Assess pain next session  and continue manual.          Problem List Patient Active Problem List   Diagnosis Date Noted  . Neck pain, bilateral posterior 12/31/2014  . Lower back pain 12/31/2014  . Yeast infection 05/07/2014  . Obesity 05/07/2014  . Night sweats 04/14/2014  . Symptomatic cholelithiasis 12/12/2013  . Abdominal pain 11/25/2013  . Hormone replacement therapy (HRT) 08/21/2013  . Preventative health care 05/16/2013  . Hypertension   . Chronic bronchitis     Teena Irani, PTA/CLT [phone]  02/18/2015, 10:43 AM  [facility] [place], [place], [address] Phone: [phone]   Fax:  [phone]

## 2015-02-19 ENCOUNTER — Ambulatory Visit (HOSPITAL_COMMUNITY): Payer: Federal, State, Local not specified - PPO | Admitting: Physical Therapy

## 2015-02-19 DIAGNOSIS — M62838 Other muscle spasm: Secondary | ICD-10-CM

## 2015-02-19 DIAGNOSIS — G8929 Other chronic pain: Secondary | ICD-10-CM

## 2015-02-19 DIAGNOSIS — M542 Cervicalgia: Secondary | ICD-10-CM

## 2015-02-19 DIAGNOSIS — M436 Torticollis: Secondary | ICD-10-CM | POA: Diagnosis not present

## 2015-02-19 DIAGNOSIS — G44221 Chronic tension-type headache, intractable: Secondary | ICD-10-CM

## 2015-02-19 NOTE — Therapy (Signed)
[place], [place], [address] Phone: [phone]   Fax:  [phone]  Physical Therapy Treatment  Patient Details  Name: Caroline Wong MRN: [id number] Date of Birth: 1961/04/08 Referring Provider:  Biagio Borg, MD  Encounter Date: 02/19/2015      PT End of Session - 02/19/15 1717    Visit Number 5   Number of Visits 12   Date for PT Re-Evaluation 03/13/15   Authorization Type BCBS   PT Start Time 1650   PT Stop Time 1740   PT Time Calculation (min) 50 min   Activity Tolerance Patient tolerated treatment well   Behavior During Therapy [facility] for tasks assessed/performed      Past Medical History  Diagnosis Date  . Chronic bronchitis   . Hypertension   . Hormone replacement therapy (HRT)   . Abdominal pain 11/25/2013    Has some nausea associated with pain that has been on and off x 2 weeks will get [place]  . Gallstones   . Anxiety   . Night sweats 04/14/2014  . Yeast infection 05/07/2014  . Obesity 05/07/2014    Past Surgical History  Procedure Laterality Date  . Wisdom tooth extraction    . Mole removal    . Cholecystectomy    . Foot surgery Left     There were no vitals filed for this visit.  Visit Diagnosis:  No diagnosis found.      Subjective Assessment - 02/19/15 1653    Subjective PT states she did very well until woken at 4am with pain of 6/10 in her LT cervical and shoulder area.  Pt states she took a tramadol at noon and is now 2/10.  Pt reports no headaches.     Currently in Pain? Yes   Pain Score 2    Pain Location Neck   Pain Orientation Left                         [facility] Adult PT Treatment/Exercise - 02/19/15 1654    Neck Exercises: Machines for Strengthening   UBE (Upper Arm Bike) 4 minutes backward   Neck Exercises: Theraband   Scapula Retraction 15 reps;Green   Shoulder Extension Green;15 reps   Rows Green;15 reps   Neck Exercises: Standing   Upper  Extremity Flexion with Stabilization 10 reps   Other Standing Exercises corner stretch 3X30"   Neck Exercises: Seated   X to V 10 reps   W Back Limitations 10 reps   Modalities   Modalities Ultrasound   Ultrasound   Ultrasound Location Lt upper trap and rhomboid   Ultrasound Parameters 1.4 w/cm2 continuous   Ultrasound Goals Pain   Manual Therapy   Manual Therapy Soft tissue mobilization;Myofascial release   Manual therapy comments seated to cervical region   Neck Exercises: Stretches   Upper Trapezius Stretch 2 reps;30 seconds   Levator Stretch 2 reps;30 seconds                  PT Short Term Goals - 02/11/15 1200    PT SHORT TERM GOAL #1   Title I HEP   Time 3   Period Days   PT SHORT TERM GOAL #2   Title Pt cervical ROM is wnl   Time 2   Period Weeks   PT SHORT TERM GOAL #3   Title Pt strength is improved by 1/2 grade to allow her pain  to be at the most a 6/10   Time 2   Period Weeks   PT SHORT TERM GOAL #4   Title Pt to understand and verbalize the improtance of posture in cervical pain and headaches.   Time 2   Period Weeks   PT SHORT TERM GOAL #5   Title Pt states that her H/A have decreased by 50%   Time 2   Period Weeks           PT Long Term Goals - 02/11/15 1201    PT LONG TERM GOAL #1   Title I advanced HEP   Time 4   Period Weeks   PT LONG TERM GOAL #2   Title Pt strength to be improved by one grade to allow pt pain to be at the most a 3/10    Time 4   Period Weeks   PT LONG TERM GOAL #3   Title Pt to have mild to no mm spasm on Lt upper trap; no mm spasm Rt    Time 4   Period Weeks   PT LONG TERM GOAL #4   Title Pt to be able to have full ROM of shoulder with no pain or difficultty.   Time 4   Period Weeks   PT LONG TERM GOAL #5   Title Pt to have had no headache  in the past week    Time 4   Period Weeks               Plan - 02/19/15 1717    Clinical Impression Statement PT able to complete most exericses with  little cues.  Added corner stretch and UE flexion against wall.  Pt with resolving spasms in upper trap and rhomboid musculature.  Pt painfree at end of session.   PT Next Visit Plan Assess pain next session and continue manual.          Problem List Patient Active Problem List   Diagnosis Date Noted  . Neck pain, bilateral posterior 12/31/2014  . Lower back pain 12/31/2014  . Yeast infection 05/07/2014  . Obesity 05/07/2014  . Night sweats 04/14/2014  . Symptomatic cholelithiasis 12/12/2013  . Abdominal pain 11/25/2013  . Hormone replacement therapy (HRT) 08/21/2013  . Preventative health care 05/16/2013  . Hypertension   . Chronic bronchitis     Teena Irani, PTA/CLT [phone]  02/19/2015, 5:19 PM  [place] [address] [place], [place], [address] Phone: [phone]   Fax:  [phone]

## 2015-02-24 ENCOUNTER — Ambulatory Visit (INDEPENDENT_AMBULATORY_CARE_PROVIDER_SITE_OTHER): Payer: Federal, State, Local not specified - PPO | Admitting: Internal Medicine

## 2015-02-24 ENCOUNTER — Encounter: Payer: Self-pay | Admitting: Internal Medicine

## 2015-02-24 VITALS — BP 140/100 | HR 75 | Ht 67.25 in | Wt 203.0 lb

## 2015-02-24 DIAGNOSIS — T63441A Toxic effect of venom of bees, accidental (unintentional), initial encounter: Secondary | ICD-10-CM | POA: Diagnosis not present

## 2015-02-24 DIAGNOSIS — M5440 Lumbago with sciatica, unspecified side: Secondary | ICD-10-CM

## 2015-02-24 DIAGNOSIS — Z79890 Hormone replacement therapy (postmenopausal): Secondary | ICD-10-CM

## 2015-02-24 DIAGNOSIS — T782XXA Anaphylactic shock, unspecified, initial encounter: Secondary | ICD-10-CM

## 2015-02-24 DIAGNOSIS — R61 Generalized hyperhidrosis: Secondary | ICD-10-CM

## 2015-02-24 DIAGNOSIS — E669 Obesity, unspecified: Secondary | ICD-10-CM

## 2015-02-24 DIAGNOSIS — M542 Cervicalgia: Secondary | ICD-10-CM | POA: Diagnosis not present

## 2015-02-24 MED ORDER — EPINEPHRINE 0.3 MG/0.3ML IJ SOAJ: 0.3000 mg | Freq: Once | INTRAMUSCULAR | Status: DC

## 2015-02-24 MED ORDER — CYCLOBENZAPRINE HCL 5 MG PO TABS: 5.0000 mg | ORAL_TABLET | Freq: Three times a day (TID) | ORAL | Status: DC | PRN

## 2015-02-24 MED ORDER — PREDNISONE 10 MG PO TABS: ORAL_TABLET | ORAL | Status: DC

## 2015-02-24 MED ORDER — PSEUDOEPHEDRINE HCL 30 MG PO TABS: 60.0000 mg | ORAL_TABLET | ORAL | Status: DC | PRN

## 2015-02-24 MED ORDER — VITAMIN D 1000 UNITS PO TABS
1000.0000 [IU] | ORAL_TABLET | Freq: Every day | ORAL | Status: AC
Start: 2015-02-24 — End: 2016-02-24

## 2015-02-24 MED ORDER — DIPHENHYDRAMINE HCL 25 MG PO TABS: 50.0000 mg | ORAL_TABLET | ORAL | Status: AC | PRN

## 2015-02-24 MED ORDER — TRAMADOL HCL 50 MG PO TABS: 50.0000 mg | ORAL_TABLET | Freq: Four times a day (QID) | ORAL | Status: DC | PRN
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
## 2015-02-24 NOTE — Progress Notes (Signed)
Pre visit review using our clinic review tool, if applicable. No additional management support is needed unless otherwise documented below in the visit note. 

## 2015-02-24 NOTE — Assessment & Plan Note (Signed)
Epi-pen Ziploc bag with Sudafed, Benadryl, Prednisone
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
## 2015-02-24 NOTE — Assessment & Plan Note (Signed)
Better on HRT
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
## 2015-02-24 NOTE — Assessment & Plan Note (Signed)
Per GYN 

## 2015-02-24 NOTE — Assessment & Plan Note (Signed)
On Phentermine - Dr Jenny Reichmann  Potential benefits of a long term phentermine use as well as potential risks  and complications were explained to the patient and were aknowledged. Wt Readings from Last 3 Encounters:  02/24/15 203 lb (92.08 kg)  02/04/15 201 lb (91.173 kg)  12/31/14 204 lb 12 oz (92.874 kg)

## 2015-02-25 ENCOUNTER — Ambulatory Visit (HOSPITAL_COMMUNITY): Payer: Federal, State, Local not specified - PPO | Admitting: Physical Therapy

## 2015-02-25 DIAGNOSIS — M542 Cervicalgia: Secondary | ICD-10-CM

## 2015-02-25 DIAGNOSIS — G8929 Other chronic pain: Secondary | ICD-10-CM

## 2015-02-25 DIAGNOSIS — G44221 Chronic tension-type headache, intractable: Secondary | ICD-10-CM

## 2015-02-25 DIAGNOSIS — M436 Torticollis: Secondary | ICD-10-CM | POA: Diagnosis not present

## 2015-02-25 NOTE — Therapy (Signed)
[place], [place], [address] Phone: [phone]   Fax:  [phone]  Physical Therapy Treatment  Patient Details  Name: Caroline Wong MRN: [id number] Date of Birth: [birth year] Referring Provider:  Biagio Borg, MD  Encounter Date: 02/25/2015      PT End of Session - 02/25/15 1611    Visit Number 6   Number of Visits 12   Date for PT Re-Evaluation 03/13/15   Authorization Type BCBS   PT Start Time 1432   PT Stop Time 1513   PT Time Calculation (min) 41 min      Past Medical History  Diagnosis Date  . Chronic bronchitis   . Hypertension   . Hormone replacement therapy (HRT)   . Abdominal pain 11/25/2013    Has some nausea associated with pain that has been on and off x 2 weeks will get [place]  . Gallstones   . Anxiety   . Night sweats 04/14/2014  . Yeast infection 05/07/2014  . Obesity 05/07/2014    Past Surgical History  Procedure Laterality Date  . Wisdom tooth extraction    . Mole removal    . Cholecystectomy    . Foot surgery Left     There were no vitals filed for this visit.  Visit Diagnosis:  Neck stiffness  Neck pain of over 3 months duration  Chronic tension-type headache, intractable      Subjective Assessment - 02/25/15 1436    Subjective Pt states that she started having a headache about two hours ago.  Pt states she is able to look to her blind spot easier and she is abel to move her shoulder better.    Currently in Pain? Yes   Pain Score 3    Pain Location Head   Pain Orientation Left   Pain Descriptors / Indicators Aching            [facility] PT Assessment - 02/25/15 0001    Strength   Cervical Extension 5/5   Cervical - Right Side Bend 5/5   Cervical - Left Side Bend 5/5                     [facility] Adult PT Treatment/Exercise - 02/25/15 0001    Neck Exercises: Machines for Strengthening   UBE (Upper Arm Bike) 4 minutes backward   Neck Exercises:  Theraband   Scapula Retraction 15 reps;Green   Shoulder Extension Green;15 reps   Rows Green;15 reps   Neck Exercises: Standing   Wall Push Ups 5 reps   Upper Extremity Flexion with Stabilization 10 reps   UE Flexion with Stabilization Limitations 2#    UE D1 Limitations pectorial stretch x 3 10" each    UE D2 Limitations counter stretch x 3    Other Standing Exercises corner stretch 3X30"   Other Standing Exercises UE flexion, abduction, w-back and x to V with 2# x 10    Neck Exercises: Seated   X to V 10 reps   W Back Limitations 10 reps   Other Seated Exercise 3 D cervical and thoracic excursion with good cervical motion   Other Seated Exercise thoracic excursion x 3.   Neck Exercises: Supine   Neck Retraction 10 reps   Neck Exercises: Prone   Axial Exentsion 10 reps   Shoulder Extension 10 reps   Modalities   Modalities --   Ultrasound   Ultrasound Goals Pain   Manual Therapy  Manual Therapy Soft tissue mobilization;Myofascial release   Manual therapy comments seated to cervical region   Neck Exercises: Stretches   Upper Trapezius Stretch 2 reps;30 seconds   Levator Stretch 2 reps;30 seconds                  PT Short Term Goals - 02/11/15 1200    PT SHORT TERM GOAL #1   Title I HEP   Time 3   Period Days   PT SHORT TERM GOAL #2   Title Pt cervical ROM is wnl   Time 2   Period Weeks   PT SHORT TERM GOAL #3   Title Pt strength is improved by 1/2 grade to allow her pain to be at the most a 6/10   Time 2   Period Weeks   PT SHORT TERM GOAL #4   Title Pt to understand and verbalize the improtance of posture in cervical pain and headaches.   Time 2   Period Weeks   PT SHORT TERM GOAL #5   Title Pt states that her H/A have decreased by 50%   Time 2   Period Weeks           PT Long Term Goals - 02/11/15 1201    PT LONG TERM GOAL #1   Title I advanced HEP   Time 4   Period Weeks   PT LONG TERM GOAL #2   Title Pt strength to be improved by one  grade to allow pt pain to be at the most a 3/10    Time 4   Period Weeks   PT LONG TERM GOAL #3   Title Pt to have mild to no mm spasm on Lt upper trap; no mm spasm Rt    Time 4   Period Weeks   PT LONG TERM GOAL #4   Title Pt to be able to have full ROM of shoulder with no pain or difficultty.   Time 4   Period Weeks   PT LONG TERM GOAL #5   Title Pt to have had no headache  in the past week    Time 4   Period Weeks               Plan - 02/25/15 1611    Clinical Impression Statement Added prone exercise to improve scapular and cervical stability.  D/C Ultrasound as no mm spasms present.  Pt currently with normal cervical stength and functional ROM.   PT Next Visit Plan Progress stability begin wall pushups as well as wall side planks.         Problem List Patient Active Problem List   Diagnosis Date Noted  . 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 Neck pain, bilateral posterior 12/31/2014  . Lower back pain 12/31/2014  . Yeast infection 05/07/2014  . Obesity 05/07/2014  . Night sweats 04/14/2014  . Abdominal pain 11/25/2013  . Hormone replacement therapy (HRT) 08/21/2013  . Preventative health care 05/16/2013  . Hypertension    Rayetta Humphrey, [place] CLT [phone] 02/25/2015, 4:14 PM  [facility] [address] [place], [place], [address] Phone: [phone]   Fax:  [phone]

## 2015-02-26 ENCOUNTER — Ambulatory Visit (HOSPITAL_COMMUNITY): Payer: Federal, State, Local not specified - PPO | Admitting: Physical Therapy

## 2015-03-02 ENCOUNTER — Ambulatory Visit (HOSPITAL_COMMUNITY): Payer: Federal, State, Local not specified - PPO | Admitting: Physical Therapy

## 2015-03-04 ENCOUNTER — Encounter: Payer: Self-pay | Admitting: Adult Health

## 2015-03-04 ENCOUNTER — Ambulatory Visit (INDEPENDENT_AMBULATORY_CARE_PROVIDER_SITE_OTHER): Payer: Federal, State, Local not specified - PPO | Admitting: Adult Health

## 2015-03-04 ENCOUNTER — Ambulatory Visit (HOSPITAL_COMMUNITY): Payer: Federal, State, Local not specified - PPO

## 2015-03-04 VITALS — BP 140/80 | HR 68 | Ht 67.25 in | Wt 203.5 lb

## 2015-03-04 DIAGNOSIS — Z6831 Body mass index (BMI) 31.0-31.9, adult: Secondary | ICD-10-CM | POA: Diagnosis not present

## 2015-03-04 DIAGNOSIS — Z713 Dietary counseling and surveillance: Secondary | ICD-10-CM

## 2015-03-04 DIAGNOSIS — M436 Torticollis: Secondary | ICD-10-CM | POA: Diagnosis not present

## 2015-03-04 DIAGNOSIS — G44221 Chronic tension-type headache, intractable: Secondary | ICD-10-CM

## 2015-03-04 DIAGNOSIS — G8929 Other chronic pain: Secondary | ICD-10-CM

## 2015-03-04 DIAGNOSIS — M542 Cervicalgia: Secondary | ICD-10-CM

## 2015-03-04 DIAGNOSIS — M62838 Other muscle spasm: Secondary | ICD-10-CM

## 2015-03-04 NOTE — Therapy (Signed)
[place], [place], [address] Phone: [phone]   Fax:  [phone]  Physical Therapy Treatment  Patient Details  Name: Caroline Wong MRN: [id number] Date of Birth: 17-Sep-1960 Referring Provider:  [name], MD  Encounter Date: 03/04/2015      PT End of Session - 03/04/15 0810    Visit Number 7   Number of Visits 12   Date for PT Re-Evaluation 03/13/15   Authorization Type BCBS   PT Start Time 0800   PT Stop Time 0850   PT Time Calculation (min) 50 min   Activity Tolerance Patient tolerated treatment well   Behavior During Therapy [facility] for tasks assessed/performed      Past Medical History  Diagnosis Date  . Chronic bronchitis   . Hypertension   . Hormone replacement therapy (HRT)   . Abdominal pain 11/25/2013    Has some nausea associated with pain that has been on and off x 2 weeks will get [place]  . Gallstones   . Anxiety   . Night sweats 04/14/2014  . Yeast infection 05/07/2014  . Obesity 05/07/2014    Past Surgical History  Procedure Laterality Date  . Wisdom tooth extraction    . Mole removal    . Cholecystectomy    . Foot surgery Left     There were no vitals filed for this visit.  Visit Diagnosis:  Neck stiffness  Neck pain of over 3 months duration  Chronic tension-type headache, intractable  Muscle spasms of neck      Subjective Assessment - 03/04/15 0800    Subjective Pt stated main problem currently with headaches.  Feels her cervical ROM is improving and has no pain   Currently in Pain? No/denies            [facility] PT Assessment - 03/04/15 0001    Assessment   Medical Diagnosis cervical spondylosis C5-6-7   Onset Date/Surgical Date 10/05/14   Next MD Visit [facility] 03/06/2015             [facility] Adult PT Treatment/Exercise - 03/04/15 0001    Exercises   Exercises Neck   Neck Exercises: Theraband   Scapula Retraction 15 reps;Green   Scapula Retraction  Limitations HEP   Shoulder Extension Green;15 reps   Shoulder Extension Limitations HEP   Rows Green;15 reps   Rows Limitations HEP   Neck Exercises: Standing   Wall Push Ups 10 reps   Wall Push Ups Limitations cueing   Upper Extremity Flexion with Stabilization 10 reps   UE Flexion with Stabilization Limitations 2#    UE D1 Limitations pectorial stretch x 3 10" each    UE D2 Limitations counter stretch x 3    Other Standing Exercises corner stretch 3X30"   Other Standing Exercises UE flexion, abduction, scaption w-back and x to V with 2# x 10; side wall planks 2x 20"   Neck Exercises: Seated   X to V 15 reps   W Back Limitations 15 reps   Other Seated Exercise 3 D cervical and thoracic excursion with good cervical motion   Neck Exercises: Supine   Other Supine Exercise TMJ movements during manual   Manual Therapy   Manual Therapy Soft tissue mobilization;Myofascial release   Manual therapy comments Supine to cervical region, facial and TMJ massage with TMJ movements for tension   Neck Exercises: Stretches   Upper Trapezius Stretch 2 reps;30 seconds   Levator Stretch 2 reps;30  seconds                  PT Short Term Goals - 02/11/15 1200    PT SHORT TERM GOAL #1   Title I HEP   Time 3   Period Days   PT SHORT TERM GOAL #2   Title Pt cervical ROM is wnl   Time 2   Period Weeks   PT SHORT TERM GOAL #3   Title Pt strength is improved by 1/2 grade to allow her pain to be at the most a 6/10   Time 2   Period Weeks   PT SHORT TERM GOAL #4   Title Pt to understand and verbalize the improtance of posture in cervical pain and headaches.   Time 2   Period Weeks   PT SHORT TERM GOAL #5   Title Pt states that her H/A have decreased by 50%   Time 2   Period Weeks           PT Long Term Goals - 02/11/15 1201    PT LONG TERM GOAL #1   Title I advanced HEP   Time 4   Period Weeks   PT LONG TERM GOAL #2   Title Pt strength to be improved by one grade to allow pt  pain to be at the most a 3/10    Time 4   Period Weeks   PT LONG TERM GOAL #3   Title Pt to have mild to no mm spasm on Lt upper trap; no mm spasm Rt    Time 4   Period Weeks   PT LONG TERM GOAL #4   Title Pt to be able to have full ROM of shoulder with no pain or difficultty.   Time 4   Period Weeks   PT LONG TERM GOAL #5   Title Pt to have had no headache  in the past week    Time 4   Period Weeks               Plan - 03/04/15 0175    Clinical Impression Statement Session focus on improving cervical and scapular stability.  Pt able to demonstrate appropriate form and technique with theraband exercises, pt given band and worksheet to add to HEP.  Began wall side planks for stability wtih min cueing for form.  Held prone exercises following reports of increased pain.  Manual techniques complete to reduce tension, no spasms palpatted.  Added facial for headache relief with noted TMJ tension, pt instructed  TMJ exercises and    PT Next Visit Plan Progress note prior MD apt next session.  Progress stability and f/u with TMJ tension and headaches following manual.        Problem List Patient Active Problem List   Diagnosis Date Noted  . 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 Neck pain, bilateral posterior 12/31/2014  . Lower back pain 12/31/2014  . Yeast infection 05/07/2014  . Obesity 05/07/2014  . Night sweats 04/14/2014  . Abdominal pain 11/25/2013  . Hormone replacement therapy (HRT) 08/21/2013  . Preventative health care 05/16/2013  . Hypertension    Ihor Austin, LPTA; [place]  Aldona Lento 03/04/2015, 9:17 AM  [place], [place], [address] Phone: [phone]   Fax:  [phone]

## 2015-03-04 NOTE — Progress Notes (Signed)
Subjective:     Patient ID: EPHRATA VERVILLE, female   DOB: 15-Feb-1961, 54 y.o.   MRN: [id number]  HPI Mabeline is a 54 year old white female, married in for weight and BP check.  Review of Systems Patient denies any headaches, hearing loss, fatigue, blurred vision, shortness of breath, chest pain, abdominal pain, problems with bowel movements, urination, or intercourse. No joint pain or mood swings. Reviewed past medical,surgical, social and family history. Reviewed medications and allergies.     Objective:   Physical Exam BP 140/80 mmHg  Pulse 68  Ht 5' 7.25" (1.708 m)  Wt 203 lb 8 oz (92.307 kg)  BMI 31.64 kg/m2  LMP 03/02/2015 Skin warm and dry. Lungs: clear to ausculation bilaterally. Cardiovascular: regular rate and rhythm. Discussed taking a break off meds and she agrees.    Assessment:     Weight counseling BMI 31.64    Plan:     Will take a break off adipex Return 04/20/15 and will resume adipex Continue weight loss efforts

## 2015-03-04 NOTE — Patient Instructions (Signed)
Will take break on adipex for now and restart in November Keep trying weight loss efforts going.

## 2015-03-05 ENCOUNTER — Ambulatory Visit (HOSPITAL_COMMUNITY): Payer: Federal, State, Local not specified - PPO | Admitting: Physical Therapy

## 2015-03-05 DIAGNOSIS — M62838 Other muscle spasm: Secondary | ICD-10-CM

## 2015-03-05 DIAGNOSIS — M436 Torticollis: Secondary | ICD-10-CM | POA: Diagnosis not present

## 2015-03-05 DIAGNOSIS — G44221 Chronic tension-type headache, intractable: Secondary | ICD-10-CM

## 2015-03-05 NOTE — Therapy (Addendum)
[facility] [address] [place], [place], [address] Phone: [phone]   Fax:  [phone]  Physical Therapy Treatment (Reassessment)  Patient Details  Name: Caroline Wong MRN: [id number] Date of Birth: [birth year] Referring Provider:  [name], MD  Encounter Date: 03/05/2015      PT End of Session - 03/04/15 0810    Visit Number 7   Number of Visits 12   Date for PT Re-Evaluation 04/04/15   Authorization Type [employer]   PT Start Time 0800   PT Stop Time 0850   PT Time Calculation (min) 50 min   Activity Tolerance Patient tolerated treatment well   Behavior During Therapy [facility] for tasks assessed/performed      Past Medical History  Diagnosis Date  . Chronic bronchitis   . Hypertension   . Hormone replacement therapy (HRT)   . Abdominal pain 11/25/2013    Has some nausea associated with pain that has been on and off x 2 weeks will get [place]  . Gallstones   . Anxiety   . Night sweats 04/14/2014  . Yeast infection 05/07/2014  . Obesity 05/07/2014    Past Surgical History  Procedure Laterality Date  . Wisdom tooth extraction    . Mole removal    . Cholecystectomy    . Foot surgery Left     There were no vitals filed for this visit.  Visit Diagnosis:  Neck stiffness  Chronic tension-type headache, intractable  Muscle spasms of neck      Subjective Assessment - 03/05/15 0935    Subjective Pt reports that she had a headache for about 12 hours yesterday after she left PT. She reports that she feels fine today, denies having any pain. Pt reports that she feels that she has more mobility with her neck and arm since beginning therapy. She reports that she continues to get long-lasting headaches, but not as frequently.    Currently in Pain? No/denies   Pain Score 0-No pain            [facility] PT Assessment - 03/05/15 0001    Observation/Other Assessments   Focus on Therapeutic Outcomes (FOTO)  54   AROM   Cervical  Flexion 34  was 28   Cervical Extension 60  was 60   Cervical - Right Side Bend 34  was 20   Cervical - Left Side Bend 30  was 22   Cervical - Right Rotation 63  was 58   Cervical - Left Rotation 56  was 48   Strength   Left Shoulder Flexion 4+/5  was 4-   Left Shoulder ABduction 4+/5  was 4-   Left Shoulder Internal Rotation 5/5  was 5   Left Shoulder External Rotation 4+/5  was 4+                [facility] Adult PT Treatment/Exercise - 03/05/15 0001    Neck Exercises: Machines for Strengthening   UBE (Upper Arm Bike) 4 minutes backward   Neck Exercises: Seated   X to V 15 reps   W Back Limitations 15 reps   Other Seated Exercise 3 D cervical and thoracic excursion with good cervical motion   Manual Therapy   Manual Therapy Soft tissue mobilization;Myofascial release   Manual therapy comments Supine to cervical region, facial and TMJ massage with TMJ movements for tension   Neck Exercises: Stretches   Upper Trapezius Stretch 2 reps;30 seconds   Levator Stretch 2  reps;30 seconds                  PT Short Term Goals - 03/05/15 1045    PT SHORT TERM GOAL #1   Title I HEP   Period Weeks   Status Achieved   PT SHORT TERM GOAL #2   Title Pt cervical ROM is wnl   Time 2   Period Weeks   Status On-going   PT SHORT TERM GOAL #3   Title Pt strength is improved by 1/2 grade to allow her pain to be at the most a 6/10   Time 2   Period Weeks   Status Achieved   PT SHORT TERM GOAL #4   Title Pt to understand and verbalize the improtance of posture in cervical pain and headaches.   Time 2   Period Weeks   Status Achieved   PT SHORT TERM GOAL #5   Title Pt states that her H/A have decreased by 50%   Time 2   Period Weeks   Status On-going           PT Long Term Goals - 03/05/15 1046    PT LONG TERM GOAL #1   Title I advanced HEP   Time 4   Period Weeks   Status On-going   PT LONG TERM GOAL #2   Title Pt strength to be improved by one grade to  allow pt pain to be at the most a 3/10    Time 4   Period Weeks   Status On-going   PT LONG TERM GOAL #3   Title Pt to have mild to no mm spasm on Lt upper trap; no mm spasm Rt    Time 4   Period Weeks   Status On-going   PT LONG TERM GOAL #4   Title Pt to be able to have full ROM of shoulder with no pain or difficultty.   Time 4   Period Weeks   Status On-going   PT LONG TERM GOAL #5   Title Pt to have had no headache  in the past week    Time 4   Period Weeks   Status On-going               Plan - 03/05/15 1056    Clinical Impression Statement Reassessment was completed today for MD appointment tomorrow. Pt is making steady progress towards her goals for cervical ROM and UE strength, with improvements in all planes of motion of cervical spine. Pt continues to experience headaches at least every other day, and while the frequency is decreasing, the duration and intensity of the headache remains the same. She will benefit from 6 more sessions to normalize ROM and improve postural muscle strength in order to decrease her headaches.    PT Frequency 2x / week   PT Duration 3 weeks   PT Next Visit Plan Continue with manual to reduce headaches, postural strengthening including deep neck flexor training.         Problem List Patient Active Problem List   Diagnosis Date Noted  . 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 Neck pain, bilateral posterior 12/31/2014  . Lower back pain 12/31/2014  . Yeast infection 05/07/2014  . Obesity 05/07/2014  . Night sweats 04/14/2014  . Abdominal pain 11/25/2013  . Hormone replacement therapy (HRT) 08/21/2013  . Preventative health care 05/16/2013  . Hypertension     Hilma Favors, PT, DPT [phone] 03/05/2015, 11:00 AM  [facility]  [place], [place], [address] Phone: [phone]   Fax:  [phone]     PHYSICAL THERAPY DISCHARGE SUMMARY  Visits from Start of  Care: 7  Current functional level related to goals / functional outcomes: See above   Remaining deficits: See above    Education / Equipment: HEP  Plan: Patient agrees to discharge.  Patient goals were partially met. Patient is being discharged due to not returning since the last visit.  ?????       Rayetta Humphrey, [place] CLT [phone]

## 2015-03-10 ENCOUNTER — Telehealth: Payer: Self-pay | Admitting: Adult Health

## 2015-03-10 ENCOUNTER — Encounter (HOSPITAL_COMMUNITY): Payer: Federal, State, Local not specified - PPO | Admitting: Physical Therapy

## 2015-03-10 MED ORDER — PRAMOXINE-HC 1-1 % EX CREA: TOPICAL_CREAM | Freq: Three times a day (TID) | CUTANEOUS | Status: DC

## 2015-03-10 NOTE — Telephone Encounter (Signed)
Pt has itching hemorrhoids will rx analpram HC

## 2015-03-11 ENCOUNTER — Telehealth: Payer: Self-pay | Admitting: Adult Health

## 2015-03-11 NOTE — Telephone Encounter (Signed)
Keep using the analpram tid and sitz baths

## 2015-03-12 ENCOUNTER — Encounter (HOSPITAL_COMMUNITY): Payer: Federal, State, Local not specified - PPO

## 2015-03-17 ENCOUNTER — Encounter (HOSPITAL_COMMUNITY): Payer: Federal, State, Local not specified - PPO | Admitting: Physical Therapy

## 2015-03-19 ENCOUNTER — Encounter (HOSPITAL_COMMUNITY): Payer: Federal, State, Local not specified - PPO | Admitting: Physical Therapy

## 2015-03-24 ENCOUNTER — Encounter (HOSPITAL_COMMUNITY): Payer: Federal, State, Local not specified - PPO

## 2015-03-26 ENCOUNTER — Encounter (HOSPITAL_COMMUNITY): Payer: Federal, State, Local not specified - PPO | Admitting: Physical Therapy

## 2015-04-01 ENCOUNTER — Other Ambulatory Visit (INDEPENDENT_AMBULATORY_CARE_PROVIDER_SITE_OTHER): Payer: Federal, State, Local not specified - PPO

## 2015-04-01 ENCOUNTER — Encounter: Payer: Self-pay | Admitting: Internal Medicine

## 2015-04-01 ENCOUNTER — Ambulatory Visit (INDEPENDENT_AMBULATORY_CARE_PROVIDER_SITE_OTHER): Payer: Federal, State, Local not specified - PPO | Admitting: Internal Medicine

## 2015-04-01 VITALS — BP 138/98 | HR 78 | Wt 206.0 lb

## 2015-04-01 DIAGNOSIS — R072 Precordial pain: Secondary | ICD-10-CM | POA: Diagnosis not present

## 2015-04-01 DIAGNOSIS — I10 Essential (primary) hypertension: Secondary | ICD-10-CM

## 2015-04-01 DIAGNOSIS — R0789 Other chest pain: Secondary | ICD-10-CM

## 2015-04-01 DIAGNOSIS — R9431 Abnormal electrocardiogram [ECG] [EKG]: Secondary | ICD-10-CM | POA: Diagnosis not present

## 2015-04-01 DIAGNOSIS — R600 Localized edema: Secondary | ICD-10-CM

## 2015-04-01 DIAGNOSIS — R609 Edema, unspecified: Secondary | ICD-10-CM | POA: Insufficient documentation

## 2015-04-01 DIAGNOSIS — R079 Chest pain, unspecified: Secondary | ICD-10-CM | POA: Insufficient documentation

## 2015-04-01 LAB — CBC WITH DIFFERENTIAL/PLATELET
BASOS ABS: 0.1 10*3/uL (ref 0.0–0.1)
Basophils Relative: 0.6 % (ref 0.0–3.0)
EOS ABS: 0.1 10*3/uL (ref 0.0–0.7)
Eosinophils Relative: 1.4 % (ref 0.0–5.0)
HEMATOCRIT: 41.2 % (ref 36.0–46.0)
Hemoglobin: 13.8 g/dL (ref 12.0–15.0)
LYMPHS PCT: 24.8 % (ref 12.0–46.0)
Lymphs Abs: 2.4 10*3/uL (ref 0.7–4.0)
MCHC: 33.5 g/dL (ref 30.0–36.0)
MCV: 96.2 fl (ref 78.0–100.0)
Monocytes Absolute: 0.9 10*3/uL (ref 0.1–1.0)
Monocytes Relative: 8.8 % (ref 3.0–12.0)
NEUTROS ABS: 6.3 10*3/uL (ref 1.4–7.7)
NEUTROS PCT: 64.4 % (ref 43.0–77.0)
PLATELETS: 322 10*3/uL (ref 150.0–400.0)
RBC: 4.28 Mil/uL (ref 3.87–5.11)
RDW: 12.5 % (ref 11.5–15.5)
WBC: 9.8 10*3/uL (ref 4.0–10.5)

## 2015-04-01 LAB — HEPATIC FUNCTION PANEL
ALT: 18 U/L (ref 0–35)
AST: 17 U/L (ref 0–37)
Albumin: 4.1 g/dL (ref 3.5–5.2)
Alkaline Phosphatase: 80 U/L (ref 39–117)
Bilirubin, Direct: 0 mg/dL (ref 0.0–0.3)
Total Bilirubin: 0.3 mg/dL (ref 0.2–1.2)
Total Protein: 7.5 g/dL (ref 6.0–8.3)

## 2015-04-01 LAB — TROPONIN I: TNIDX: 0 ug/l (ref 0.00–0.06)

## 2015-04-01 LAB — BASIC METABOLIC PANEL
BUN: 20 mg/dL (ref 6–23)
CO2: 28 mEq/L (ref 19–32)
CREATININE: 0.87 mg/dL (ref 0.40–1.20)
Calcium: 9.4 mg/dL (ref 8.4–10.5)
Chloride: 103 mEq/L (ref 96–112)
GFR: 72.68 mL/min (ref >60.00)
Glucose, Bld: 78 mg/dL (ref 70–99)
Potassium: 3.8 mEq/L (ref 3.5–5.1)
Sodium: 138 mEq/L (ref 135–145)

## 2015-04-01 LAB — TSH: TSH: 0.63 u[IU]/mL (ref 0.35–4.50)

## 2015-04-01 LAB — LIPASE: Lipase: 32 U/L (ref 11.0–59.0)

## 2015-04-01 LAB — CREATININE KINASE MB: CK MB: 0.9 ng/mL (ref 0.3–4.0)

## 2015-04-01 MED ORDER — ASPIRIN 325 MG PO TABS: 325.0000 mg | ORAL_TABLET | Freq: Every day | ORAL | Status: DC

## 2015-04-01 MED ORDER — LOSARTAN POTASSIUM-HCTZ 100-12.5 MG PO TABS: 1.0000 | ORAL_TABLET | Freq: Every day | ORAL | Status: DC

## 2015-04-01 MED ORDER — AMLODIPINE BESYLATE 5 MG PO TABS: 5.0000 mg | ORAL_TABLET | Freq: Every day | ORAL | Status: DC

## 2015-04-01 MED ORDER — PANTOPRAZOLE SODIUM 40 MG PO TBEC: 40.0000 mg | DELAYED_RELEASE_TABLET | Freq: Every day | ORAL | Status: DC

## 2015-04-01 NOTE — Patient Instructions (Signed)
To ER if chest pain relapsed

## 2015-04-01 NOTE — Progress Notes (Signed)
Pre visit review using our clinic review tool, if applicable. No additional management support is needed unless otherwise documented below in the visit note. 

## 2015-04-01 NOTE — Assessment & Plan Note (Signed)
10/16 ?etiology [facility] Labs Treat GERD Stress test To ER if chest pain relapsed

## 2015-04-01 NOTE — Assessment & Plan Note (Signed)
10/16 L>R related to amlodipine 10 mg - likely

## 2015-04-01 NOTE — Assessment & Plan Note (Signed)
Change meds to Amlodipine 5 mg and Hyzaar 100-12.5

## 2015-04-01 NOTE — Assessment & Plan Note (Addendum)
10/16 ?etiology [facility] Labs Stress test To ER if chest pain relapsed
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
## 2015-04-05 ENCOUNTER — Emergency Department (HOSPITAL_COMMUNITY)
Admission: EM | Admit: 2015-04-05 | Discharge: 2015-04-05 | Disposition: A | Discharge status: Home / Self Care | Payer: Federal, State, Local not specified - PPO | Attending: Emergency Medicine | Admitting: Emergency Medicine

## 2015-04-05 ENCOUNTER — Encounter (HOSPITAL_COMMUNITY): Payer: Self-pay | Admitting: Emergency Medicine

## 2015-04-05 DIAGNOSIS — S0181XA Laceration without foreign body of other part of head, initial encounter: Secondary | ICD-10-CM

## 2015-04-05 DIAGNOSIS — Z8709 Personal history of other diseases of the respiratory system: Secondary | ICD-10-CM | POA: Insufficient documentation

## 2015-04-05 DIAGNOSIS — Z23 Encounter for immunization: Secondary | ICD-10-CM | POA: Diagnosis not present

## 2015-04-05 DIAGNOSIS — Z7952 Long term (current) use of systemic steroids: Secondary | ICD-10-CM | POA: Diagnosis not present

## 2015-04-05 DIAGNOSIS — Z7982 Long term (current) use of aspirin: Secondary | ICD-10-CM | POA: Insufficient documentation

## 2015-04-05 DIAGNOSIS — E669 Obesity, unspecified: Secondary | ICD-10-CM | POA: Diagnosis not present

## 2015-04-05 DIAGNOSIS — I10 Essential (primary) hypertension: Secondary | ICD-10-CM | POA: Insufficient documentation

## 2015-04-05 DIAGNOSIS — F419 Anxiety disorder, unspecified: Secondary | ICD-10-CM | POA: Diagnosis not present

## 2015-04-05 DIAGNOSIS — W228XXA Striking against or struck by other objects, initial encounter: Secondary | ICD-10-CM | POA: Diagnosis not present

## 2015-04-05 DIAGNOSIS — Y9389 Activity, other specified: Secondary | ICD-10-CM | POA: Insufficient documentation

## 2015-04-05 DIAGNOSIS — Y9289 Other specified places as the place of occurrence of the external cause: Secondary | ICD-10-CM | POA: Diagnosis not present

## 2015-04-05 DIAGNOSIS — Z79899 Other long term (current) drug therapy: Secondary | ICD-10-CM | POA: Insufficient documentation

## 2015-04-05 DIAGNOSIS — S0993XA Unspecified injury of face, initial encounter: Secondary | ICD-10-CM | POA: Diagnosis present

## 2015-04-05 DIAGNOSIS — Y998 Other external cause status: Secondary | ICD-10-CM | POA: Insufficient documentation

## 2015-04-05 DIAGNOSIS — Z8619 Personal history of other infectious and parasitic diseases: Secondary | ICD-10-CM | POA: Insufficient documentation

## 2015-04-05 MED ORDER — TETANUS-DIPHTH-ACELL PERTUSSIS 5-2.5-18.5 LF-MCG/0.5 IM SUSP
0.5000 mL | Freq: Once | INTRAMUSCULAR | Status: AC
  Administered 2015-04-05: 0.5 mL via INTRAMUSCULAR
  Filled 2015-04-05: qty 0.5

## 2015-04-05 MED ORDER — LIDOCAINE-EPINEPHRINE-TETRACAINE (LET) SOLUTION
3.0000 mL | Freq: Once | NASAL | Status: AC
  Administered 2015-04-05: 3 mL via TOPICAL
  Filled 2015-04-05: qty 3

## 2015-04-05 NOTE — ED Notes (Signed)
Patient with no complaints at this time. Respirations even and unlabored. Skin warm/dry. Discharge instructions reviewed with patient at this time. Patient given opportunity to voice concerns/ask questions. Patient discharged at this time and left Emergency Department with steady gait.   

## 2015-04-05 NOTE — ED Notes (Signed)
Wound cleansed by patient PTA. Cleansed by RN with sterile normal saline and sur-cleans.

## 2015-04-05 NOTE — Discharge Instructions (Signed)
Facial Laceration ° A facial laceration is a cut on the face. These injuries can be painful and cause bleeding. Lacerations usually heal quickly, but they need special care to reduce scarring. °DIAGNOSIS  °Your health care provider will take a medical history, ask for details about how the injury occurred, and examine the wound to determine how deep the cut is. °TREATMENT  °Some facial lacerations may not require closure. Others may not be able to be closed because of an increased risk of infection. The risk of infection and the chance for successful closure will depend on various factors, including the amount of time since the injury occurred. °The wound may be cleaned to help prevent infection. If closure is appropriate, pain medicines may be given if needed. Your health care provider will use stitches (sutures), wound glue (adhesive), or skin adhesive strips to repair the laceration. These tools bring the skin edges together to allow for faster healing and a better cosmetic outcome. If needed, you may also be given a tetanus shot. °HOME CARE INSTRUCTIONS °· Only take over-the-counter or prescription medicines as directed by your health care provider. °· Follow your health care provider's instructions for wound care. These instructions will vary depending on the technique used for closing the wound. °For Sutures: °· Keep the wound clean and dry.   °· If you were given a bandage (dressing), you should change it at least once a day. Also change the dressing if it becomes wet or dirty, or as directed by your health care provider.   °· Wash the wound with soap and water 2 times a day. Rinse the wound off with water to remove all soap. Pat the wound dry with a clean towel.   °· After cleaning, apply a thin layer of the antibiotic ointment recommended by your health care provider. This will help prevent infection and keep the dressing from sticking.   °· You may shower as usual after the first 24 hours. Do not soak the  wound in water until the sutures are removed.   °· Get your sutures removed as directed by your health care provider. With facial lacerations, sutures should usually be taken out after 4-5 days to avoid stitch marks.   °· Wait a few days after your sutures are removed before applying any makeup. °For Skin Adhesive Strips: °· Keep the wound clean and dry.   °· Do not get the skin adhesive strips wet. You may bathe carefully, using caution to keep the wound dry.   °· If the wound gets wet, pat it dry with a clean towel.   °· Skin adhesive strips will fall off on their own. You may trim the strips as the wound heals. Do not remove skin adhesive strips that are still stuck to the wound. They will fall off in time.   °For Wound Adhesive: °· You may briefly wet your wound in the shower or bath. Do not soak or scrub the wound. Do not swim. Avoid periods of heavy sweating until the skin adhesive has fallen off on its own. After showering or bathing, gently pat the wound dry with a clean towel.   °· Do not apply liquid medicine, cream medicine, ointment medicine, or makeup to your wound while the skin adhesive is in place. This may loosen the film before your wound is healed.   °· If a dressing is placed over the wound, be careful not to apply tape directly over the skin adhesive. This may cause the adhesive to be pulled off before the wound is healed.   °· Avoid   prolonged exposure to sunlight or tanning lamps while the skin adhesive is in place. °· The skin adhesive will usually remain in place for 5-10 days, then naturally fall off the skin. Do not pick at the adhesive film.   °After Healing: °Once the wound has healed, cover the wound with sunscreen during the day for 1 full year. This can help minimize scarring. Exposure to ultraviolet light in the first year will darken the scar. It can take 1-2 years for the scar to lose its redness and to heal completely.  °SEEK MEDICAL CARE IF: °· You have a fever. °SEEK IMMEDIATE  MEDICAL CARE IF: °· You have redness, pain, or swelling around the wound.   °· You see a yellowish-white fluid (pus) coming from the wound.   °  °This information is not intended to replace advice given to you by your health care provider. Make sure you discuss any questions you have with your health care provider. °  °Document Released: 06/30/2004 Document Revised: 06/13/2014 Document Reviewed: 01/03/2013 °Elsevier Interactive Patient Education ©2016 [employer] ° °
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
## 2015-04-08 ENCOUNTER — Telehealth (HOSPITAL_COMMUNITY): Payer: Self-pay

## 2015-04-08 NOTE — Telephone Encounter (Signed)
Patient given detailed instructions per Myocardial Perfusion Study Information Sheet for the test on 04-13-2015 at 0745. Patient notified to arrive 15 minutes early and that it is imperative to arrive on time for appointment to keep from having the test rescheduled.  If you need to cancel or reschedule your appointment, please call the office within 24 hours of your appointment. Failure to do so may result in a cancellation of your appointment, and a $50 no show fee. Patient verbalized understanding.Oletta Lamas, Rayden Scheper A

## 2015-04-09 ENCOUNTER — Encounter (HOSPITAL_COMMUNITY): Payer: Federal, State, Local not specified - PPO

## 2015-04-13 ENCOUNTER — Ambulatory Visit (HOSPITAL_COMMUNITY): Payer: Federal, State, Local not specified - PPO | Attending: Cardiology

## 2015-04-13 DIAGNOSIS — Z8249 Family history of ischemic heart disease and other diseases of the circulatory system: Secondary | ICD-10-CM | POA: Diagnosis not present

## 2015-04-13 DIAGNOSIS — R110 Nausea: Secondary | ICD-10-CM | POA: Diagnosis not present

## 2015-04-13 DIAGNOSIS — R9431 Abnormal electrocardiogram [ECG] [EKG]: Secondary | ICD-10-CM | POA: Diagnosis not present

## 2015-04-13 DIAGNOSIS — R0609 Other forms of dyspnea: Secondary | ICD-10-CM | POA: Insufficient documentation

## 2015-04-13 DIAGNOSIS — R072 Precordial pain: Secondary | ICD-10-CM

## 2015-04-13 DIAGNOSIS — I10 Essential (primary) hypertension: Secondary | ICD-10-CM | POA: Insufficient documentation

## 2015-04-13 LAB — MYOCARDIAL PERFUSION IMAGING
CHL CUP NUCLEAR SRS: 0
CHL CUP RESTING HR STRESS: 77 {beats}/min
LV sys vol: 32 mL
LVDIAVOL: 85 mL
NUC STRESS TID: 1.01
Peak HR: 153 {beats}/min
RATE: 0.35
SDS: 2
SSS: 2

## 2015-04-13 MED ORDER — AMINOPHYLLINE 25 MG/ML IV SOLN
75.0000 mg | Freq: Two times a day (BID) | INTRAVENOUS | Status: DC | PRN
  Administered 2015-04-13: 75 mg via INTRAVENOUS

## 2015-04-13 MED ORDER — REGADENOSON 0.4 MG/5ML IV SOLN
0.4000 mg | Freq: Once | INTRAVENOUS | Status: AC
  Administered 2015-04-13: 0.4 mg via INTRAVENOUS

## 2015-04-13 MED ORDER — TECHNETIUM TC 99M SESTAMIBI GENERIC - CARDIOLITE
31.3000 | Freq: Once | INTRAVENOUS | Status: AC | PRN
  Administered 2015-04-13: 31.3 via INTRAVENOUS

## 2015-04-13 MED ORDER — TECHNETIUM TC 99M SESTAMIBI GENERIC - CARDIOLITE
10.6000 | Freq: Once | INTRAVENOUS | Status: AC | PRN
  Administered 2015-04-13: 11 via INTRAVENOUS

## 2015-04-15 ENCOUNTER — Telehealth: Payer: Self-pay | Admitting: *Deleted

## 2015-04-15 NOTE — Telephone Encounter (Signed)
Left msg on triage requesting refills on stress test.. Called pt gave md response on stress test../lmb

## 2015-04-22 ENCOUNTER — Ambulatory Visit: Payer: Federal, State, Local not specified - PPO | Admitting: Adult Health

## 2015-04-23 ENCOUNTER — Other Ambulatory Visit (HOSPITAL_COMMUNITY): Payer: Self-pay | Admitting: Orthopaedic Surgery

## 2015-05-05 ENCOUNTER — Ambulatory Visit: Payer: Federal, State, Local not specified - PPO | Admitting: Internal Medicine

## 2015-05-13 ENCOUNTER — Encounter: Payer: Self-pay | Admitting: Internal Medicine

## 2015-05-13 ENCOUNTER — Other Ambulatory Visit: Payer: Federal, State, Local not specified - PPO

## 2015-05-13 ENCOUNTER — Ambulatory Visit (INDEPENDENT_AMBULATORY_CARE_PROVIDER_SITE_OTHER): Payer: Federal, State, Local not specified - PPO | Admitting: Internal Medicine

## 2015-05-13 ENCOUNTER — Other Ambulatory Visit (INDEPENDENT_AMBULATORY_CARE_PROVIDER_SITE_OTHER): Payer: Federal, State, Local not specified - PPO

## 2015-05-13 VITALS — BP 138/98 | HR 73 | Wt 210.0 lb

## 2015-05-13 DIAGNOSIS — R5383 Other fatigue: Secondary | ICD-10-CM | POA: Insufficient documentation

## 2015-05-13 DIAGNOSIS — I10 Essential (primary) hypertension: Secondary | ICD-10-CM

## 2015-05-13 DIAGNOSIS — R600 Localized edema: Secondary | ICD-10-CM

## 2015-05-13 DIAGNOSIS — R5382 Chronic fatigue, unspecified: Secondary | ICD-10-CM

## 2015-05-13 DIAGNOSIS — M542 Cervicalgia: Secondary | ICD-10-CM

## 2015-05-13 LAB — BASIC METABOLIC PANEL
BUN: 16 mg/dL (ref 6–23)
CALCIUM: 9 mg/dL (ref 8.4–10.5)
CO2: 29 mEq/L (ref 19–32)
Chloride: 102 mEq/L (ref 96–112)
Creatinine, Ser: 0.85 mg/dL (ref 0.40–1.20)
GFR: 74.62 mL/min (ref >60.00)
GLUCOSE: 112 mg/dL — AB (ref 70–99)
Potassium: 3.7 mEq/L (ref 3.5–5.1)
SODIUM: 138 meq/L (ref 135–145)

## 2015-05-13 MED ORDER — PHENTERMINE HCL 37.5 MG PO CAPS
37.5000 mg | ORAL_CAPSULE | ORAL | Status: DC
Start: 2015-05-13 — End: 2015-06-06

## 2015-05-13 MED ORDER — TRAMADOL HCL 50 MG PO TABS: 50.0000 mg | ORAL_TABLET | Freq: Four times a day (QID) | ORAL | Status: DC | PRN

## 2015-05-13 MED ORDER — METOPROLOL SUCCINATE ER 25 MG PO TB24: 12.5000 mg | ORAL_TABLET | Freq: Every day | ORAL | Status: DC

## 2015-05-13 NOTE — Assessment & Plan Note (Signed)
Chronic - ?etiology Labs Take amlodipine, Toprol (1/2 tab) q hs

## 2015-05-13 NOTE — Assessment & Plan Note (Signed)
Better  

## 2015-05-13 NOTE — Patient Instructions (Signed)
Support hose!
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
## 2015-05-13 NOTE — Progress Notes (Signed)
Pre visit review using our clinic review tool, if applicable. No additional management support is needed unless otherwise documented below in the visit note. 

## 2015-05-13 NOTE — Assessment & Plan Note (Addendum)
Hyzaar 100-12.5 Amlodipine 5 mg/d Toprol XL 1/2 tab qd 12.5 mg

## 2015-05-14 LAB — VITAMIN D 25 HYDROXY (VIT D DEFICIENCY, FRACTURES): VITD: 59.96 ng/mL (ref 30.00–100.00)

## 2015-05-14 LAB — T3, FREE: T3, Free: 3 pg/mL (ref 2.3–4.2)

## 2015-05-14 LAB — TSH: TSH: 0.6 u[IU]/mL (ref 0.35–4.50)

## 2015-05-14 LAB — VITAMIN B12: Vitamin B-12: 340 pg/mL (ref 211–911)

## 2015-05-20 ENCOUNTER — Encounter: Payer: Self-pay | Admitting: Internal Medicine

## 2015-05-20 ENCOUNTER — Ambulatory Visit (INDEPENDENT_AMBULATORY_CARE_PROVIDER_SITE_OTHER): Payer: Federal, State, Local not specified - PPO | Admitting: Internal Medicine

## 2015-05-20 VITALS — BP 120/70 | HR 85 | Temp 98.7°F | Wt 208.0 lb

## 2015-05-20 DIAGNOSIS — J0100 Acute maxillary sinusitis, unspecified: Secondary | ICD-10-CM

## 2015-05-20 DIAGNOSIS — J019 Acute sinusitis, unspecified: Secondary | ICD-10-CM | POA: Insufficient documentation

## 2015-05-20 MED ORDER — AZITHROMYCIN 250 MG PO TABS: ORAL_TABLET | ORAL | Status: DC

## 2015-05-20 MED ORDER — FLUCONAZOLE 150 MG PO TABS: 150.0000 mg | ORAL_TABLET | Freq: Once | ORAL | Status: DC

## 2015-05-20 NOTE — Progress Notes (Signed)
Pre visit review using our clinic review tool, if applicable. No additional management support is needed unless otherwise documented below in the visit note. 

## 2015-05-20 NOTE — Progress Notes (Signed)
Subjective:  Patient ID: Caroline Wong, female    DOB: [birth year]  Age: 54 y.o. MRN: [id number]  CC: No chief complaint on file.   HPI DELAINI HERINGER presents for sinus drainage/pain x 1 week  Outpatient Prescriptions Prior to Visit  Medication Sig Dispense Refill  . amLODipine (NORVASC) 5 MG tablet Take 1 tablet (5 mg total) by mouth daily. 30 tablet 11  . aspirin (BAYER ASPIRIN) 325 MG tablet Take 1 tablet (325 mg total) by mouth daily. 100 tablet 3  . cholecalciferol (VITAMIN D) 1000 UNITS tablet Take 1 tablet (1,000 Units total) by mouth daily. 100 tablet 3  . cyclobenzaprine (FLEXERIL) 5 MG tablet Take 1 tablet (5 mg total) by mouth 3 (three) times daily as needed for muscle spasms. 30 tablet 0  . diphenhydrAMINE (BENADRYL) 25 MG tablet Take 2 tablets (50 mg total) by mouth every 4 (four) hours as needed (bee sting). 30 tablet 3  . ELESTRIN 0.52 MG/0.87 GM (0.06%) GEL APPLY 1 APPLICATION DAILY 52 g 6  . LORazepam (ATIVAN) 0.5 MG tablet take 1 tablet by mouth at bedtime 30 tablet 5  . losartan-hydrochlorothiazide (HYZAAR) 100-12.5 MG tablet Take 1 tablet by mouth daily. 30 tablet 11  . metoprolol succinate (TOPROL-XL) 25 MG 24 hr tablet Take 0.5 tablets (12.5 mg total) by mouth daily. Take at bedtime 30 tablet 11  . pantoprazole (PROTONIX) 40 MG tablet Take 1 tablet (40 mg total) by mouth daily. 30 tablet 3  . phentermine 37.5 MG capsule Take 1 capsule (37.5 mg total) by mouth every morning. 30 capsule 2  . predniSONE (DELTASONE) 10 MG tablet 5 tabs po prn bee sting 30 tablet 3  . progesterone (PROMETRIUM) 200 MG capsule TAKE ONE ATBLET AT BEDTIME 30 capsule 11  . pseudoephedrine (SUDAFED) 30 MG tablet Take 2 tablets (60 mg total) by mouth every 4 (four) hours as needed for congestion (bee sting). 30 tablet 3  . traMADol (ULTRAM) 50 MG tablet Take 1 tablet (50 mg total) by mouth every 6 (six) hours as needed. 60 tablet 1  . valACYclovir (VALTREX) 1000 MG tablet take 1 tablet by  mouth once daily 30 tablet PRN  . EPINEPHrine 0.3 mg/0.3 mL IJ SOAJ injection Inject 0.3 mLs (0.3 mg total) into the muscle once. (Patient not taking: Reported on 05/20/2015) 2 Device 2  . pramoxine-hydrocortisone (ANALPRAM HC) cream Apply topically 3 (three) times daily. (Patient not taking: Reported on 05/20/2015) 30 g 1   Facility-Administered Medications Prior to Visit  Medication Dose Route Frequency Provider Last Rate Last Dose  . aminophylline injection 75 mg  75 mg Intravenous BID PRN Dorothy Spark, MD   75 mg at 04/13/15 1037    ROS Review of Systems  Constitutional: Positive for fever and fatigue.  HENT: Positive for congestion, postnasal drip, rhinorrhea and sinus pressure.   Respiratory: Negative for cough and wheezing.   Cardiovascular: Negative for leg swelling.    Objective:  BP 120/70 mmHg  Pulse 85  Temp(Src) 98.7 F (37.1 C) (Oral)  Wt 208 lb (94.348 kg)  SpO2 95%  BP Readings from Last 3 Encounters:  05/20/15 120/70  05/13/15 138/98  04/05/15 152/84    Wt Readings from Last 3 Encounters:  05/20/15 208 lb (94.348 kg)  05/13/15 210 lb (95.255 kg)  04/13/15 206 lb (93.441 kg)    Physical Exam  Constitutional: She appears well-developed. No distress.  HENT:  Head: Normocephalic.  Right Ear: External ear normal.  Left Ear:  External ear normal.  Nose: Nose normal.  Mouth/Throat: Oropharynx is clear and moist.  Eyes: Conjunctivae are normal. Pupils are equal, round, and reactive to light. Right eye exhibits no discharge. Left eye exhibits no discharge.  Neck: Normal range of motion. Neck supple. No JVD present. No tracheal deviation present. No thyromegaly present.  Cardiovascular: Normal rate, regular rhythm and normal heart sounds.   Pulmonary/Chest: No stridor. No respiratory distress. She has no wheezes.  Abdominal: Soft. Bowel sounds are normal. She exhibits no distension and no mass. There is no tenderness. There is no rebound and no guarding.    Musculoskeletal: She exhibits no edema or tenderness.  Lymphadenopathy:    She has no cervical adenopathy.  Neurological: She displays normal reflexes. No cranial nerve deficit. She exhibits normal muscle tone. Coordination normal.  Skin: No rash noted. No erythema.  Psychiatric: She has a normal mood and affect. Her behavior is normal. Judgment and thought content normal.  swollen nasal mucosa  Lab Results  Component Value Date   WBC 9.8 04/01/2015   HGB 13.8 04/01/2015   HCT 41.2 04/01/2015   PLT 322.0 04/01/2015   GLUCOSE 112* 05/13/2015   CHOL 183 10/30/2013   TRIG 93.0 10/30/2013   HDL 57.10 10/30/2013   LDLCALC 107* 10/30/2013   ALT 18 04/01/2015   AST 17 04/01/2015   NA 138 05/13/2015   K 3.7 05/13/2015   CL 102 05/13/2015   CREATININE 0.85 05/13/2015   BUN 16 05/13/2015   CO2 29 05/13/2015   TSH 0.60 05/13/2015    No results found.  Assessment & Plan:   Diagnoses and all orders for this visit:  Acute maxillary sinusitis, recurrence not specified  Other orders -     azithromycin (ZITHROMAX) 250 MG tablet; As directed -     fluconazole (DIFLUCAN) 150 MG tablet; Take 1 tablet (150 mg total) by mouth once.  I am having Ms. Pensabene start on azithromycin and fluconazole. I am also having her maintain her progesterone, valACYclovir, ELESTRIN, LORazepam, cyclobenzaprine, pseudoephedrine, diphenhydrAMINE, predniSONE, cholecalciferol, EPINEPHrine, pramoxine-hydrocortisone, aspirin, pantoprazole, losartan-hydrochlorothiazide, amLODipine, metoprolol succinate, traMADol, and phentermine.  Meds ordered this encounter  Medications  . azithromycin (ZITHROMAX) 250 MG tablet    Sig: As directed    Dispense:  6 tablet    Refill:  0  . fluconazole (DIFLUCAN) 150 MG tablet    Sig: Take 1 tablet (150 mg total) by mouth once.    Dispense:  1 tablet    Refill:  1     Follow-up: No Follow-up on file.  Walker Kehr, MD

## 2015-05-20 NOTE — Assessment & Plan Note (Signed)
Zpac 

## 2015-05-26 NOTE — Pre-Procedure Instructions (Signed)
NORMALINDA NOBLITT  05/26/2015     Your procedure is scheduled on : Friday June 05, 2015 at 7:30 AM.  Report to [facility] Admitting at 5:30 AM.  Call this number if you have problems the morning of surgery: [phone]    Remember:  Do not eat food or drink liquids after midnight.  Take these medicines the morning of surgery with A SIP OF WATER : Amlodipine (Norvasc), Metoprolol (Toprol XL), Pantoprazole (Protonix), Tramadol (Ultram) if needed, Valacyclovir (Valtrex), Ativan if needed   Stop taking any vitamins, herbal medications, Ibuprofen, Advil, Motrin, Aleve, etc on Friday December 23rd   Stop taking Phentermine now   Do not wear jewelry, make-up or nail polish.  Do not wear lotions, powders, or perfumes.    Do not shave 48 hours prior to surgery.    Do not bring valuables to the hospital.  [facility] is not responsible for any belongings or valuables.  Contacts, dentures or bridgework may not be worn into surgery.  Leave your suitcase in the car.  After surgery it may be brought to your room.  For patients admitted to the hospital, discharge time will be determined by your treatment team.  Patients discharged the day of surgery will not be allowed to drive home.   Name and phone number of your driver:    Special instructions:  Shower using CHG soap the night before and the morning of your surgery  Please read over the following fact sheets that you were given. Pain Booklet, Coughing and Deep Breathing, MRSA Information and Surgical Site Infection Prevention

## 2015-05-27 ENCOUNTER — Encounter (HOSPITAL_COMMUNITY)
Admission: RE | Admit: 2015-05-27 | Discharge: 2015-05-27 | Disposition: A | Discharge status: Home / Self Care | Payer: Federal, State, Local not specified - PPO | Source: Ambulatory Visit | Attending: Orthopaedic Surgery | Admitting: Orthopaedic Surgery

## 2015-05-27 ENCOUNTER — Encounter (HOSPITAL_COMMUNITY): Payer: Self-pay

## 2015-05-27 DIAGNOSIS — Z7982 Long term (current) use of aspirin: Secondary | ICD-10-CM | POA: Diagnosis not present

## 2015-05-27 DIAGNOSIS — K219 Gastro-esophageal reflux disease without esophagitis: Secondary | ICD-10-CM | POA: Diagnosis not present

## 2015-05-27 DIAGNOSIS — Z79899 Other long term (current) drug therapy: Secondary | ICD-10-CM | POA: Diagnosis not present

## 2015-05-27 DIAGNOSIS — M47812 Spondylosis without myelopathy or radiculopathy, cervical region: Secondary | ICD-10-CM | POA: Insufficient documentation

## 2015-05-27 DIAGNOSIS — I10 Essential (primary) hypertension: Secondary | ICD-10-CM | POA: Insufficient documentation

## 2015-05-27 DIAGNOSIS — Z01812 Encounter for preprocedural laboratory examination: Secondary | ICD-10-CM | POA: Insufficient documentation

## 2015-05-27 DIAGNOSIS — Z01818 Encounter for other preprocedural examination: Secondary | ICD-10-CM | POA: Diagnosis not present

## 2015-05-27 HISTORY — DX: Gastro-esophageal reflux disease without esophagitis: K21.9

## 2015-05-27 LAB — PROTIME-INR
INR: 1.02 (ref 0.00–1.49)
Prothrombin Time: 13.6 seconds (ref 11.6–15.2)

## 2015-05-27 LAB — COMPREHENSIVE METABOLIC PANEL
ALK PHOS: 89 U/L (ref 38–126)
ALT: 20 U/L (ref 14–54)
ANION GAP: 10 (ref 5–15)
AST: 20 U/L (ref 15–41)
Albumin: 3.9 g/dL (ref 3.5–5.0)
BUN: 27 mg/dL — ABNORMAL HIGH (ref 6–20)
CALCIUM: 9.2 mg/dL (ref 8.9–10.3)
CO2: 25 mmol/L (ref 22–32)
Chloride: 104 mmol/L (ref 101–111)
Creatinine, Ser: 0.9 mg/dL (ref 0.44–1.00)
GFR calc non Af Amer: >60 mL/min (ref >60)
Glucose, Bld: 122 mg/dL — ABNORMAL HIGH (ref 65–99)
POTASSIUM: 3.9 mmol/L (ref 3.5–5.1)
SODIUM: 139 mmol/L (ref 135–145)
TOTAL PROTEIN: 7.4 g/dL (ref 6.5–8.1)
Total Bilirubin: 0.6 mg/dL (ref 0.3–1.2)

## 2015-05-27 LAB — URINALYSIS, ROUTINE W REFLEX MICROSCOPIC
BILIRUBIN URINE: NEGATIVE
Glucose, UA: NEGATIVE mg/dL
HGB URINE DIPSTICK: NEGATIVE
Ketones, ur: NEGATIVE mg/dL
Leukocytes, UA: NEGATIVE
NITRITE: NEGATIVE
PROTEIN: NEGATIVE mg/dL
Specific Gravity, Urine: 1.022 (ref 1.005–1.030)
pH: 7 (ref 5.0–8.0)

## 2015-05-27 LAB — HCG, SERUM, QUALITATIVE: PREG SERUM: NEGATIVE

## 2015-05-27 LAB — CBC
HCT: 45.1 % (ref 36.0–46.0)
HEMOGLOBIN: 15.5 g/dL — AB (ref 12.0–15.0)
MCH: 33.4 pg (ref 26.0–34.0)
MCHC: 34.4 g/dL (ref 30.0–36.0)
MCV: 97.2 fL (ref 78.0–100.0)
Platelets: 288 10*3/uL (ref 150–400)
RBC: 4.64 MIL/uL (ref 3.87–5.11)
RDW: 12.4 % (ref 11.5–15.5)
WBC: 8.2 10*3/uL (ref 4.0–10.5)

## 2015-05-27 LAB — SURGICAL PCR SCREEN
MRSA, PCR: POSITIVE — AB
STAPHYLOCOCCUS AUREUS: POSITIVE — AB

## 2015-05-27 NOTE — Progress Notes (Signed)
PCP is Tyrone Apple Plotnikov  Patient denied having any acute cardiac or pulmonary issues  EKG noted in EPIC on 04/01/15. Nurse asked patient if she had another EKG done for comparison and patient informed Nurse that the last EKG she had was in 2000.  Stress test noted in EPIC on 04/13/15

## 2015-05-27 NOTE — Progress Notes (Signed)
Anesthesia Chart Review:  Pt is 54 year old female scheduled for C5-6, C6-7 ACDF on 06/05/2015 with Dr. Lorin Mercy.   PMH includes:  HTN, chronic bronchitis, GERD. Never smoker. BMI 31.   Medications includes: amlodipine, ASA, losartan-hctz, metoprolol, protonix, phentermine.   Preoperative labs reviewed.    Chest x-ray 05/27/15 reviewed. No active cardiopulmonary disease.   EKG 04/01/15: sinus rhythm. Anteroseptal infarct, age undetermined.   Nuclear stress test 04/13/15:   Nuclear stress EF: 62%.  There was no ST segment deviation noted during stress.  The study is normal.  This is a low risk study.  The left ventricular ejection fraction is normal (55-65%).  If no changes, I anticipate pt can proceed with surgery as scheduled.   Willeen Cass, FNP-BC [facility]/Anesthesiology Phone: [phone] 05/27/2015 12:29 PM

## 2015-05-27 NOTE — Progress Notes (Signed)
Nurse called prescription Mupirocin ointment into [facility] pharmacy, and then Nurse called patient and informed patient of positive PCR results. Patient verbalized understanding.

## 2015-05-28 ENCOUNTER — Other Ambulatory Visit (HOSPITAL_COMMUNITY): Payer: Self-pay | Admitting: Orthopaedic Surgery

## 2015-06-04 MED ORDER — VANCOMYCIN HCL IN DEXTROSE 1-5 GM/200ML-% IV SOLN
1000.0000 mg | INTRAVENOUS | Status: AC
  Administered 2015-06-05: 1000 mg via INTRAVENOUS
  Filled 2015-06-04: qty 200

## 2015-06-04 NOTE — Anesthesia Preprocedure Evaluation (Addendum)
Anesthesia Evaluation  Patient identified by MRN, date of birth, ID band Patient awake    Reviewed: Allergy & Precautions, NPO status , Patient's Chart, lab work & pertinent test results  Airway Mallampati: II  TM Distance: >3 FB Neck ROM: Full    Dental  (+) Dental Advisory Given, Teeth Intact   Pulmonary neg pulmonary ROS,    breath sounds clear to auscultation       Cardiovascular hypertension, Pt. on medications and Pt. on home beta blockers  Rhythm:Regular Rate:Normal     Neuro/Psych negative neurological ROS     GI/Hepatic Neg liver ROS, GERD  Medicated and Controlled,  Endo/Other  negative endocrine ROS  Renal/GU negative Renal ROS     Musculoskeletal   Abdominal   Peds  Hematology negative hematology ROS (+)   Anesthesia Other Findings Upper front teeth have wavy edge, not chipped  Reproductive/Obstetrics                           Lab Results  Component Value Date   WBC 8.2 05/27/2015   HGB 15.5* 05/27/2015   HCT 45.1 05/27/2015   MCV 97.2 05/27/2015   PLT 288 05/27/2015   Lab Results  Component Value Date   CREATININE 0.90 05/27/2015   BUN 27* 05/27/2015   NA 139 05/27/2015   K 3.9 05/27/2015   CL 104 05/27/2015   CO2 25 05/27/2015   Lab Results  Component Value Date   INR 1.02 05/27/2015    Anesthesia Physical Anesthesia Plan  ASA: II  Anesthesia Plan: General   Post-op Pain Management:    Induction: Intravenous  Airway Management Planned: Oral ETT  Additional Equipment:   Intra-op Plan:   Post-operative Plan: Extubation in OR  Informed Consent: I have reviewed the patients History and Physical, chart, labs and discussed the procedure including the risks, benefits and alternatives for the proposed anesthesia with the patient or authorized representative who has indicated his/her understanding and acceptance.   Dental advisory given  Plan Discussed  with: CRNA  Anesthesia Plan Comments:        Anesthesia Quick Evaluation

## 2015-06-05 ENCOUNTER — Ambulatory Visit (HOSPITAL_COMMUNITY): Payer: Federal, State, Local not specified - PPO | Admitting: Emergency Medicine

## 2015-06-05 ENCOUNTER — Encounter (HOSPITAL_COMMUNITY)
Admission: RE | Disposition: A | Discharge status: Home / Self Care | Payer: Self-pay | Source: Ambulatory Visit | Attending: Orthopaedic Surgery

## 2015-06-05 ENCOUNTER — Ambulatory Visit (HOSPITAL_COMMUNITY): Payer: Federal, State, Local not specified - PPO

## 2015-06-05 ENCOUNTER — Ambulatory Visit (HOSPITAL_COMMUNITY): Payer: Federal, State, Local not specified - PPO | Admitting: Anesthesiology

## 2015-06-05 ENCOUNTER — Observation Stay (HOSPITAL_COMMUNITY)
Admission: RE | Admit: 2015-06-05 | Discharge: 2015-06-06 | Disposition: A | Discharge status: Home / Self Care | Payer: Federal, State, Local not specified - PPO | Source: Ambulatory Visit | Attending: Orthopaedic Surgery | Admitting: Orthopaedic Surgery

## 2015-06-05 ENCOUNTER — Encounter (HOSPITAL_COMMUNITY): Payer: Self-pay | Admitting: Surgery

## 2015-06-05 DIAGNOSIS — M47812 Spondylosis without myelopathy or radiculopathy, cervical region: Principal | ICD-10-CM | POA: Insufficient documentation

## 2015-06-05 DIAGNOSIS — E669 Obesity, unspecified: Secondary | ICD-10-CM | POA: Diagnosis not present

## 2015-06-05 DIAGNOSIS — Z79899 Other long term (current) drug therapy: Secondary | ICD-10-CM | POA: Insufficient documentation

## 2015-06-05 DIAGNOSIS — M4802 Spinal stenosis, cervical region: Secondary | ICD-10-CM | POA: Insufficient documentation

## 2015-06-05 DIAGNOSIS — I10 Essential (primary) hypertension: Secondary | ICD-10-CM | POA: Diagnosis not present

## 2015-06-05 DIAGNOSIS — Z79890 Hormone replacement therapy (postmenopausal): Secondary | ICD-10-CM | POA: Diagnosis not present

## 2015-06-05 DIAGNOSIS — K219 Gastro-esophageal reflux disease without esophagitis: Secondary | ICD-10-CM | POA: Insufficient documentation

## 2015-06-05 DIAGNOSIS — Z6831 Body mass index (BMI) 31.0-31.9, adult: Secondary | ICD-10-CM | POA: Insufficient documentation

## 2015-06-05 DIAGNOSIS — Z419 Encounter for procedure for purposes other than remedying health state, unspecified: Secondary | ICD-10-CM

## 2015-06-05 DIAGNOSIS — Z981 Arthrodesis status: Secondary | ICD-10-CM

## 2015-06-05 HISTORY — PX: ANTERIOR FUSION CERVICAL SPINE: SUR626

## 2015-06-05 HISTORY — PX: ANTERIOR CERVICAL DECOMP/DISCECTOMY FUSION: SHX1161

## 2015-06-05 SURGERY — ANTERIOR CERVICAL DECOMPRESSION/DISCECTOMY FUSION 2 LEVELS
Anesthesia: General

## 2015-06-05 MED ORDER — LIDOCAINE HCL (CARDIAC) 20 MG/ML IV SOLN
INTRAVENOUS | Status: AC
  Filled 2015-06-05: qty 5

## 2015-06-05 MED ORDER — LOSARTAN POTASSIUM-HCTZ 100-12.5 MG PO TABS: 1.0000 | ORAL_TABLET | Freq: Every day | ORAL | Status: DC

## 2015-06-05 MED ORDER — DEXTROSE 5 % IV SOLN
INTRAVENOUS | Status: DC | PRN
  Administered 2015-06-05: 07:00:00 via INTRAVENOUS

## 2015-06-05 MED ORDER — LACTATED RINGERS IV SOLN
INTRAVENOUS | Status: DC | PRN
  Administered 2015-06-05 (×2): via INTRAVENOUS

## 2015-06-05 MED ORDER — VANCOMYCIN HCL 10 G IV SOLR
1250.0000 mg | Freq: Once | INTRAVENOUS | Status: AC
  Administered 2015-06-05: 1250 mg via INTRAVENOUS
  Filled 2015-06-05: qty 1250

## 2015-06-05 MED ORDER — OXYCODONE HCL 5 MG PO TABS: 5.0000 mg | ORAL_TABLET | Freq: Once | ORAL | Status: DC | PRN

## 2015-06-05 MED ORDER — OXYCODONE HCL 5 MG PO TABS
ORAL_TABLET | ORAL | Status: AC
  Filled 2015-06-05: qty 1

## 2015-06-05 MED ORDER — LOSARTAN POTASSIUM 50 MG PO TABS
100.0000 mg | ORAL_TABLET | Freq: Every day | ORAL | Status: DC
  Filled 2015-06-05: qty 2

## 2015-06-05 MED ORDER — HEMOSTATIC AGENTS (NO CHARGE) OPTIME
TOPICAL | Status: DC | PRN
  Administered 2015-06-05: 1 via TOPICAL

## 2015-06-05 MED ORDER — METHOCARBAMOL 500 MG PO TABS
500.0000 mg | ORAL_TABLET | Freq: Four times a day (QID) | ORAL | Status: DC | PRN
  Administered 2015-06-05 – 2015-06-06 (×2): 500 mg via ORAL
  Filled 2015-06-05 (×3): qty 1

## 2015-06-05 MED ORDER — HYDROMORPHONE HCL 1 MG/ML IJ SOLN
0.2500 mg | INTRAMUSCULAR | Status: DC | PRN
  Administered 2015-06-05: 1 mg via INTRAVENOUS

## 2015-06-05 MED ORDER — GLYCOPYRROLATE 0.2 MG/ML IJ SOLN
INTRAMUSCULAR | Status: AC
  Filled 2015-06-05: qty 3

## 2015-06-05 MED ORDER — ACETAMINOPHEN 650 MG RE SUPP: 650.0000 mg | RECTAL | Status: DC | PRN

## 2015-06-05 MED ORDER — MENTHOL 3 MG MT LOZG
1.0000 | LOZENGE | OROMUCOSAL | Status: DC | PRN
Start: 2015-06-05 — End: 2015-06-06

## 2015-06-05 MED ORDER — MIDAZOLAM HCL 5 MG/5ML IJ SOLN
INTRAMUSCULAR | Status: DC | PRN
  Administered 2015-06-05 (×2): 1 mg via INTRAVENOUS

## 2015-06-05 MED ORDER — OXYCODONE-ACETAMINOPHEN 5-325 MG PO TABS
1.0000 | ORAL_TABLET | Freq: Four times a day (QID) | ORAL | Status: DC | PRN
  Administered 2015-06-05 – 2015-06-06 (×4): 2 via ORAL
  Filled 2015-06-05 (×4): qty 2

## 2015-06-05 MED ORDER — ONDANSETRON HCL 4 MG/2ML IJ SOLN: 4.0000 mg | INTRAMUSCULAR | Status: DC | PRN

## 2015-06-05 MED ORDER — NEOSTIGMINE METHYLSULFATE 10 MG/10ML IV SOLN
INTRAVENOUS | Status: DC | PRN
  Administered 2015-06-05: 4 mg via INTRAVENOUS

## 2015-06-05 MED ORDER — POTASSIUM CHLORIDE IN NACL 20-0.45 MEQ/L-% IV SOLN
INTRAVENOUS | Status: DC
  Filled 2015-06-05 (×2): qty 1000

## 2015-06-05 MED ORDER — HYDROCHLOROTHIAZIDE 12.5 MG PO CAPS
12.5000 mg | ORAL_CAPSULE | Freq: Every day | ORAL | Status: DC
  Filled 2015-06-05: qty 1

## 2015-06-05 MED ORDER — OXYCODONE HCL 5 MG PO TABS
5.0000 mg | ORAL_TABLET | Freq: Once | ORAL | Status: AC | PRN
  Administered 2015-06-05: 5 mg via ORAL

## 2015-06-05 MED ORDER — OXYCODONE HCL 5 MG/5ML PO SOLN: 5.0000 mg | Freq: Once | ORAL | Status: DC | PRN

## 2015-06-05 MED ORDER — LORAZEPAM 0.5 MG PO TABS
0.5000 mg | ORAL_TABLET | Freq: Every day | ORAL | Status: DC
  Administered 2015-06-05: 0.5 mg via ORAL
  Filled 2015-06-05: qty 1

## 2015-06-05 MED ORDER — SODIUM CHLORIDE 0.9 % IJ SOLN: 3.0000 mL | INTRAMUSCULAR | Status: DC | PRN

## 2015-06-05 MED ORDER — SENNOSIDES-DOCUSATE SODIUM 8.6-50 MG PO TABS: 1.0000 | ORAL_TABLET | Freq: Every evening | ORAL | Status: DC | PRN

## 2015-06-05 MED ORDER — ACETAMINOPHEN 325 MG PO TABS: 650.0000 mg | ORAL_TABLET | ORAL | Status: DC | PRN

## 2015-06-05 MED ORDER — METOPROLOL SUCCINATE ER 25 MG PO TB24
12.5000 mg | ORAL_TABLET | Freq: Every day | ORAL | Status: DC
  Administered 2015-06-05: 12.5 mg via ORAL
  Filled 2015-06-05: qty 1

## 2015-06-05 MED ORDER — PHENYLEPHRINE HCL 10 MG/ML IJ SOLN
10.0000 mg | INTRAVENOUS | Status: DC | PRN
  Administered 2015-06-05: 40 ug/min via INTRAVENOUS

## 2015-06-05 MED ORDER — ROCURONIUM BROMIDE 50 MG/5ML IV SOLN
INTRAVENOUS | Status: AC
  Filled 2015-06-05: qty 1

## 2015-06-05 MED ORDER — METHOCARBAMOL 1000 MG/10ML IJ SOLN
500.0000 mg | Freq: Four times a day (QID) | INTRAVENOUS | Status: DC | PRN
  Administered 2015-06-05: 500 mg via INTRAVENOUS
  Filled 2015-06-05 (×2): qty 5

## 2015-06-05 MED ORDER — OXYCODONE-ACETAMINOPHEN 5-325 MG PO TABS: 1.0000 | ORAL_TABLET | Freq: Four times a day (QID) | ORAL | Status: DC | PRN

## 2015-06-05 MED ORDER — DOCUSATE SODIUM 100 MG PO CAPS
100.0000 mg | ORAL_CAPSULE | Freq: Two times a day (BID) | ORAL | Status: DC
  Administered 2015-06-05 – 2015-06-06 (×2): 100 mg via ORAL
  Filled 2015-06-05 (×2): qty 1

## 2015-06-05 MED ORDER — GLYCOPYRROLATE 0.2 MG/ML IJ SOLN
INTRAMUSCULAR | Status: DC | PRN
  Administered 2015-06-05: 0.6 mg via INTRAVENOUS

## 2015-06-05 MED ORDER — LIDOCAINE HCL (CARDIAC) 20 MG/ML IV SOLN
INTRAVENOUS | Status: DC | PRN
  Administered 2015-06-05: 60 mg via INTRAVENOUS

## 2015-06-05 MED ORDER — FENTANYL CITRATE (PF) 250 MCG/5ML IJ SOLN
INTRAMUSCULAR | Status: AC
  Filled 2015-06-05: qty 5

## 2015-06-05 MED ORDER — ONDANSETRON HCL 4 MG/2ML IJ SOLN
INTRAMUSCULAR | Status: AC
  Filled 2015-06-05: qty 2

## 2015-06-05 MED ORDER — AMLODIPINE BESYLATE 5 MG PO TABS
5.0000 mg | ORAL_TABLET | Freq: Every day | ORAL | Status: DC
  Administered 2015-06-06: 5 mg via ORAL
  Filled 2015-06-05: qty 1

## 2015-06-05 MED ORDER — PROPOFOL 10 MG/ML IV BOLUS
INTRAVENOUS | Status: DC | PRN
  Administered 2015-06-05: 200 mg via INTRAVENOUS

## 2015-06-05 MED ORDER — HYDROMORPHONE HCL 1 MG/ML IJ SOLN
0.5000 mg | INTRAMUSCULAR | Status: DC | PRN
Start: 2015-06-05 — End: 2015-06-06
  Administered 2015-06-05 (×2): 0.5 mg via INTRAVENOUS
  Filled 2015-06-05 (×2): qty 1

## 2015-06-05 MED ORDER — VALACYCLOVIR HCL 500 MG PO TABS
1000.0000 mg | ORAL_TABLET | Freq: Every day | ORAL | Status: DC
  Administered 2015-06-05: 1000 mg via ORAL
  Filled 2015-06-05 (×2): qty 2

## 2015-06-05 MED ORDER — BUPIVACAINE-EPINEPHRINE (PF) 0.25% -1:200000 IJ SOLN
INTRAMUSCULAR | Status: AC
  Filled 2015-06-05: qty 30

## 2015-06-05 MED ORDER — MIDAZOLAM HCL 2 MG/2ML IJ SOLN
INTRAMUSCULAR | Status: AC
  Filled 2015-06-05: qty 2

## 2015-06-05 MED ORDER — PANTOPRAZOLE SODIUM 40 MG PO TBEC
40.0000 mg | DELAYED_RELEASE_TABLET | Freq: Every day | ORAL | Status: DC
  Administered 2015-06-06: 40 mg via ORAL
  Filled 2015-06-05: qty 1

## 2015-06-05 MED ORDER — SODIUM CHLORIDE 0.9 % IJ SOLN: 3.0000 mL | Freq: Two times a day (BID) | INTRAMUSCULAR | Status: DC

## 2015-06-05 MED ORDER — HYDROMORPHONE HCL 1 MG/ML IJ SOLN: 0.2500 mg | INTRAMUSCULAR | Status: DC | PRN

## 2015-06-05 MED ORDER — HYDROMORPHONE HCL 1 MG/ML IJ SOLN
INTRAMUSCULAR | Status: AC
  Filled 2015-06-05: qty 1

## 2015-06-05 MED ORDER — BUPIVACAINE-EPINEPHRINE 0.25% -1:200000 IJ SOLN
INTRAMUSCULAR | Status: DC | PRN
  Administered 2015-06-05: 7 mL

## 2015-06-05 MED ORDER — NEOSTIGMINE METHYLSULFATE 10 MG/10ML IV SOLN
INTRAVENOUS | Status: AC
  Filled 2015-06-05: qty 1

## 2015-06-05 MED ORDER — PROMETHAZINE HCL 25 MG/ML IJ SOLN: 6.2500 mg | INTRAMUSCULAR | Status: DC | PRN

## 2015-06-05 MED ORDER — 0.9 % SODIUM CHLORIDE (POUR BTL) OPTIME
TOPICAL | Status: DC | PRN
  Administered 2015-06-05: 1000 mL

## 2015-06-05 MED ORDER — PROPOFOL 10 MG/ML IV BOLUS
INTRAVENOUS | Status: AC
  Filled 2015-06-05: qty 40

## 2015-06-05 MED ORDER — METHOCARBAMOL 500 MG PO TABS: 500.0000 mg | ORAL_TABLET | Freq: Four times a day (QID) | ORAL | Status: DC

## 2015-06-05 MED ORDER — FENTANYL CITRATE (PF) 100 MCG/2ML IJ SOLN
INTRAMUSCULAR | Status: DC | PRN
  Administered 2015-06-05 (×4): 50 ug via INTRAVENOUS

## 2015-06-05 MED ORDER — PHENOL 1.4 % MT LIQD
1.0000 | OROMUCOSAL | Status: DC | PRN
  Administered 2015-06-05: 1 via OROMUCOSAL
  Filled 2015-06-05: qty 177

## 2015-06-05 MED ORDER — ROCURONIUM BROMIDE 100 MG/10ML IV SOLN
INTRAVENOUS | Status: DC | PRN
  Administered 2015-06-05: 40 mg via INTRAVENOUS

## 2015-06-05 MED ORDER — SODIUM CHLORIDE 0.9 % IV SOLN: 250.0000 mL | INTRAVENOUS | Status: DC

## 2015-06-05 MED ORDER — ONDANSETRON HCL 4 MG/2ML IJ SOLN
INTRAMUSCULAR | Status: DC | PRN
  Administered 2015-06-05: 4 mg via INTRAVENOUS

## 2015-06-05 SURGICAL SUPPLY — 57 items
APL SKNCLS STERI-STRIP NONHPOA (GAUZE/BANDAGES/DRESSINGS) ×1
BENZOIN TINCTURE PRP APPL 2/3 (GAUZE/BANDAGES/DRESSINGS) ×2 IMPLANT
BIT DRILL SKYLINE 12MM (BIT) IMPLANT
BLADE SURG ROTATE 9660 (MISCELLANEOUS) IMPLANT
BONE CERV LORDOTIC 14.5X12X6 (Bone Implant) ×2 IMPLANT
BONE CERV LORDOTIC 14.5X12X7 (Bone Implant) ×2 IMPLANT
BUR ROUND FLUTED 4 SOFT TCH (BURR) ×1 IMPLANT
COLLAR CERV LO CONTOUR FIRM DE (SOFTGOODS) ×1 IMPLANT
CORDS BIPOLAR (ELECTRODE) ×2 IMPLANT
COVER SURGICAL LIGHT HANDLE (MISCELLANEOUS) ×2 IMPLANT
CRADLE DONUT ADULT HEAD (MISCELLANEOUS) ×2 IMPLANT
DRAPE C-ARM 42X72 X-RAY (DRAPES) ×2 IMPLANT
DRAPE MICROSCOPE LEICA (MISCELLANEOUS) ×2 IMPLANT
DRAPE PROXIMA HALF (DRAPES) ×2 IMPLANT
DRILL BIT SKYLINE 12MM (BIT) ×2
DURAPREP 6ML APPLICATOR 50/CS (WOUND CARE) ×2 IMPLANT
ELECT COATED BLADE 2.86 ST (ELECTRODE) ×2 IMPLANT
ELECT REM PT RETURN 9FT ADLT (ELECTROSURGICAL) ×2
ELECTRODE REM PT RTRN 9FT ADLT (ELECTROSURGICAL) ×1 IMPLANT
EVACUATOR 1/8 PVC DRAIN (DRAIN) ×2 IMPLANT
GAUZE SPONGE 4X4 12PLY STRL (GAUZE/BANDAGES/DRESSINGS) ×2 IMPLANT
GAUZE XEROFORM 1X8 LF (GAUZE/BANDAGES/DRESSINGS) ×1 IMPLANT
GLOVE BIOGEL PI IND STRL 8 (GLOVE) ×2 IMPLANT
GLOVE BIOGEL PI INDICATOR 8 (GLOVE) ×2
GLOVE ORTHO TXT STRL SZ7.5 (GLOVE) ×4 IMPLANT
GOWN STRL REUS W/ TWL LRG LVL3 (GOWN DISPOSABLE) ×1 IMPLANT
GOWN STRL REUS W/ TWL XL LVL3 (GOWN DISPOSABLE) ×1 IMPLANT
GOWN STRL REUS W/TWL 2XL LVL3 (GOWN DISPOSABLE) ×2 IMPLANT
GOWN STRL REUS W/TWL LRG LVL3 (GOWN DISPOSABLE) ×2
GOWN STRL REUS W/TWL XL LVL3 (GOWN DISPOSABLE) ×2
GRAFT BNE SPCR VG2 14.5X12X6 (Bone Implant) IMPLANT
GRAFT BNE SPCR VG2 14.5X12X7 (Bone Implant) IMPLANT
HEAD HALTER (SOFTGOODS) ×2 IMPLANT
HEMOSTAT SURGICEL 2X14 (HEMOSTASIS) IMPLANT
KIT BASIN OR (CUSTOM PROCEDURE TRAY) ×2 IMPLANT
KIT ROOM TURNOVER OR (KITS) ×2 IMPLANT
MANIFOLD NEPTUNE II (INSTRUMENTS) ×1 IMPLANT
NDL 25GX 5/8IN NON SAFETY (NEEDLE) ×1 IMPLANT
NEEDLE 25GX 5/8IN NON SAFETY (NEEDLE) ×2 IMPLANT
NS IRRIG 1000ML POUR BTL (IV SOLUTION) ×2 IMPLANT
PACK ORTHO CERVICAL (CUSTOM PROCEDURE TRAY) ×2 IMPLANT
PAD ARMBOARD 7.5X6 YLW CONV (MISCELLANEOUS) ×4 IMPLANT
PATTIES SURGICAL .5 X.5 (GAUZE/BANDAGES/DRESSINGS) IMPLANT
PIN TEMP SKYLINE THREADED (PIN) ×1 IMPLANT
PLATE SKYLINE TWO LEVEL 32MM (Plate) ×1 IMPLANT
RESTRAINT LIMB HOLDER UNIV (RESTRAINTS) ×1 IMPLANT
SCREW VARIABLE SELF TAP 12MM (Screw) ×6 IMPLANT
STRIP CLOSURE SKIN 1/2X4 (GAUZE/BANDAGES/DRESSINGS) ×2 IMPLANT
SURGIFLO W/THROMBIN 8M KIT (HEMOSTASIS) ×1 IMPLANT
SUT BONE WAX W31G (SUTURE) ×2 IMPLANT
SUT VIC AB 3-0 X1 27 (SUTURE) ×2 IMPLANT
SUT VICRYL 4-0 PS2 18IN ABS (SUTURE) ×4 IMPLANT
SYR 30ML SLIP (SYRINGE) ×2 IMPLANT
TOWEL OR 17X24 6PK STRL BLUE (TOWEL DISPOSABLE) ×2 IMPLANT
TOWEL OR 17X26 10 PK STRL BLUE (TOWEL DISPOSABLE) ×2 IMPLANT
TRAY FOLEY CATH 16FR SILVER (SET/KITS/TRAYS/PACK) IMPLANT
WATER STERILE IRR 1000ML POUR (IV SOLUTION) ×2 IMPLANT

## 2015-06-05 NOTE — H&P (Signed)
Caroline Wong is an 54 y.o. female.   Chief Complaint:  Chronic neck pain and shoulder pain, cervical spondylosis HPI: failed conservative Tx time 6 months including traction, prednisone pack, NSAIDS. MRI cervical spondylosis with congenital short pedicles  C5-6 , C6-7.   Past Medical History  Diagnosis Date  . Chronic bronchitis ([place])   . Hypertension   . Hormone replacement therapy (HRT)   . Abdominal pain 11/25/2013    Has some nausea associated with pain that has been on and off x 2 weeks will get [place]  . Gallstones   . Night sweats 04/14/2014  . Yeast infection 05/07/2014  . Obesity 05/07/2014  . Anxiety     Takes Lorazepam  . GERD (gastroesophageal reflux disease)     Past Surgical History  Procedure Laterality Date  . Wisdom tooth extraction    . Mole removal    . Cholecystectomy    . Foot surgery Left   . Colonoscopy      Family History  Problem Relation Age of Onset  . Breast cancer Mother     breast  . Diabetes Mother   . Fibromyalgia Mother   . Heart disease Mother     CHF  . Hypertension Mother   . Kidney disease Mother   . Heart disease Father   . Emphysema Father   . Diabetes Brother   . Hypertension Brother     x 2  . Heart disease Maternal Grandmother   . Diabetes Maternal Grandmother   . Asthma Maternal Grandmother   . Arthritis Maternal Grandmother   . Heart disease Maternal Grandfather   . Arthritis Maternal Grandfather   . Cancer Maternal Grandfather     bladder  . Breast cancer Paternal Grandmother   . Heart disease Paternal Grandmother   . Arthritis Paternal Grandmother   . Stroke Paternal Grandmother   . Diabetes Paternal Grandfather   . Heart disease Paternal Grandfather   . Colon cancer Paternal Grandfather     mets  . Arthritis Paternal Grandfather   . Stomach cancer Paternal Grandfather     mets to stomach  . Esophageal cancer Neg Hx   . Pancreatic cancer Neg Hx   . Rectal cancer Neg Hx   . Hypertension Brother    Social  History:  reports that she has never smoked. She has never used smokeless tobacco. She reports that she drinks alcohol. She reports that she does not use illicit drugs.  Allergies:  Allergies  Allergen Reactions  . Bee Venom Swelling  . Biaxin [Clarithromycin] Swelling  . Chloraprep One Step [Chlorhexidine Gluconate] Rash  . Keflex [Cephalexin] Rash    Medications Prior to Admission  Medication Sig Dispense Refill  . amLODipine (NORVASC) 5 MG tablet Take 1 tablet (5 mg total) by mouth daily. 30 tablet 11  . aspirin (BAYER ASPIRIN) 325 MG tablet Take 1 tablet (325 mg total) by mouth daily. 100 tablet 3  . cholecalciferol (VITAMIN D) 1000 UNITS tablet Take 1 tablet (1,000 Units total) by mouth daily. 100 tablet 3  . ELESTRIN 0.52 MG/0.87 GM (0.06%) GEL APPLY 1 APPLICATION DAILY 52 g 6  . LORazepam (ATIVAN) 0.5 MG tablet take 1 tablet by mouth at bedtime 30 tablet 5  . losartan-hydrochlorothiazide (HYZAAR) 100-12.5 MG tablet Take 1 tablet by mouth daily. 30 tablet 11  . metoprolol succinate (TOPROL-XL) 25 MG 24 hr tablet Take 0.5 tablets (12.5 mg total) by mouth daily. Take at bedtime (Patient taking differently: Take 25 mg by  mouth daily. ) 30 tablet 11  . pantoprazole (PROTONIX) 40 MG tablet Take 1 tablet (40 mg total) by mouth daily. 30 tablet 3  . phentermine 37.5 MG capsule Take 1 capsule (37.5 mg total) by mouth every morning. 30 capsule 2  . progesterone (PROMETRIUM) 200 MG capsule TAKE ONE ATBLET AT BEDTIME (Patient taking differently: TAKE ONE TABLET AT BEDTIME) 30 capsule 11  . traMADol (ULTRAM) 50 MG tablet Take 1 tablet (50 mg total) by mouth every 6 (six) hours as needed. 60 tablet 1  . valACYclovir (VALTREX) 1000 MG tablet take 1 tablet by mouth once daily 30 tablet PRN  . cyclobenzaprine (FLEXERIL) 5 MG tablet Take 1 tablet (5 mg total) by mouth 3 (three) times daily as needed for muscle spasms. 30 tablet 0  . diphenhydrAMINE (BENADRYL) 25 MG tablet Take 2 tablets (50 mg total)  by mouth every 4 (four) hours as needed (bee sting). 30 tablet 3  . predniSONE (DELTASONE) 10 MG tablet 5 tabs po prn bee sting (Patient taking differently: Take 50 mg by mouth once as needed (bee sting). ) 30 tablet 3  . pseudoephedrine (SUDAFED) 30 MG tablet Take 2 tablets (60 mg total) by mouth every 4 (four) hours as needed for congestion (bee sting). 30 tablet 3    No results found for this or any previous visit (from the past 48 hour(s)). No results found.  Review of Systems  Constitutional: Negative for fever and chills.  Eyes: Negative for blurred vision and photophobia.  Gastrointestinal: Negative for diarrhea.       Prev. Cholycystectomy  Musculoskeletal: Positive for joint pain and neck pain.  Skin: Negative for rash.  Endo/Heme/Allergies:       Allergic to bee stings  Psychiatric/Behavioral: Negative for suicidal ideas.    Blood pressure 173/84, pulse 82, temperature 98.3 F (36.8 C), temperature source Oral, resp. rate 16, height 5' 7.5" (1.715 m), weight 91.627 kg (202 lb), last menstrual period 05/26/2015, SpO2 100 %. Physical Exam  Constitutional: She is oriented to person, place, and time. She appears well-developed and well-nourished.  HENT:  Head: Normocephalic.  Brachial plexus tenderness. Pos spurling. Neg Lhermitte.   Eyes: Conjunctivae and EOM are normal. Pupils are equal, round, and reactive to light.  Neck: Normal range of motion. Neck supple. No thyromegaly present.  Cardiovascular: Normal rate.   Respiratory: Effort normal and breath sounds normal. No stridor.  GI: Soft. Bowel sounds are normal.  Musculoskeletal:  Intact reflexes, motor intact. LE strength intact.   Neurological: She is alert and oriented to person, place, and time.  Skin: Skin is warm and dry.  Psychiatric: She has a normal mood and affect. Her behavior is normal. Judgment and thought content normal.   MRI C5-6 , C6-7 spondylosis with effacement of CSF anteriorly, congenital stenosis  with short pedicles.     Assessment/Plan 2 level cervical spondylosis for ACDF, allograft and plate. Procedure and risks discussed , she agrees to proceed.   Marykate Heuberger C 06/05/2015, 7:20 AM

## 2015-06-05 NOTE — Transfer of Care (Signed)
Immediate Anesthesia Transfer of Care Note  Patient: Caroline Wong  Procedure(s) Performed: Procedure(s): C5-6, C6-7 Anterior Cervical Discectomy and Fusion, Allograft, Plate (N/A)  Patient Location: PACU  Anesthesia Type:General  Level of Consciousness: awake and patient cooperative  Airway & Oxygen Therapy: Patient Spontanous Breathing and Patient connected to nasal cannula oxygen  Post-op Assessment: Report given to RN, Post -op Vital signs reviewed and stable and Patient moving all extremities  Post vital signs: Reviewed and stable  Last Vitals:  Filed Vitals:   06/05/15 0622  BP: 173/84  Pulse: 82  Temp: 36.8 C  Resp: 16    Complications: No apparent anesthesia complications

## 2015-06-05 NOTE — Interval H&P Note (Signed)
History and Physical Interval Note:  06/05/2015 7:27 AM  Caroline Wong  has presented today for surgery, with the diagnosis of C5-6, C6-7 Spondylosis, Congenital Stenosis  The various methods of treatment have been discussed with the patient and family. After consideration of risks, benefits and other options for treatment, the patient has consented to  Procedure(s): C5-6, C6-7 Anterior Cervical Discectomy and Fusion, Allograft, Plate (N/A) as a surgical intervention .  The patient's history has been reviewed, patient examined, no change in status, stable for surgery.  I have reviewed the patient's chart and labs.  Questions were answered to the patient's satisfaction.     Tranisha Tissue C

## 2015-06-05 NOTE — Addendum Note (Signed)
Addendum  created 06/05/15 1622 by Terrill Mohr, CRNA   Modules edited: Anesthesia Events, Narrator   Narrator:  Narrator: Event Log Edited

## 2015-06-05 NOTE — Op Note (Signed)
NAME:  Caroline Wong, Caroline Wong NO.:  [id number]  MEDICAL RECORD NO.:  [id number]  LOCATION:  [facility]                         FACILITY:  [facility]  PHYSICIAN:  Kaida Games C. Lorin Wong, M.D.    DATE OF BIRTH:  1960/11/05  DATE OF PROCEDURE:  06/05/2015 DATE OF DISCHARGE:                              OPERATIVE REPORT   PREOPERATIVE DIAGNOSIS:  Cervical spondylosis with stenosis, C5-6, C6-7.  POSTOPERATIVE DIAGNOSIS:  Cervical spondylosis with stenosis, C5-6, C6- 7.  PROCEDURE:  C5-6, C6-7 anterior cervical diskectomy and fusion, allograft and plate.  SURGEON:  Edgar Reisz C. Lorin Mercy, MD  ASSISTANT:  Benjiman Core, PA-C medically necessary and present for the entire procedure.  ANESTHESIA:  General plus 7 mL Marcaine local.  ESTIMATED BLOOD LOSS:  Less than 100 mL.  IMPLANT:  Skyline 32 mm plate 12 mm screws x6 VG2 graft 7 mm at C5-6, 6 mm at C6-7.  DePuy plate.  BRIEF HISTORY:  A 54 year old female with congenital short pedicles disk protrusion and chronic neck pain, failed conservative treatment including prednisone pack, anti-inflammatories, physical therapy, activity modification with persistent symptoms greater than 6 months.  PROCEDURE IN DETAIL:  After standard prepping and draping, preoperative vancomycin due to positive PCR for MRSA, next prepped with DuraPrep. Head halter traction was applied.  Yellow foam pads over the elbow arms tucked at the sides.  Prepping with DuraPrep, they were squared with towels, sterile skin marker and prominent skin fold at the appropriate level starting at the midline extending to the left.  Betadine, Steri- Drape, sterile Mayo stand at the head, thyroid sheets, and drapes. Incision was made starting at the midline extending to the left. Platysma divided in line with the skin fibers, blunt dissection down to the level of longus coli.  Needle was placed at C5-6 underneath the omohyoid, confirmed with a lateral C-arm x-ray with the pull down from wrist  restraints.  This level was marked after self-retaining Cloward retractors were placed with scalpel and pituitary taking a large chunk with partial diskectomy.  C5-6 was the most significant level, so this level was done first.  Teeth retractors were placed right and left. Smooth blades up and down.  Diskectomy was performed.  Trudee Kuster was used to template curetting with the Cloward curettes.  Operative microscope was used for take down of the posterior longitudinal ligament.  There was a disk bulge protrusion contributing to the effacement of the CSF without cord pressure.  Uncovertebral joints were stripped, opened up using 1 and 2 mm Kerrisons.  Once posterior longitudinal ligament was completely removed, trial Sizer showed 7, gave good fit.  Graft was marked anteriorly at the midline, vertically, and pulling by CRNA, graft was countersunk 2 mm.  Anterior spurs were removed with a Estill Cotta rongeur. Identical procedure repeated at C6-7 less pronounced protrusion at this level.  A 6 mm graft fit at this level.  Uncovertebral joints were taken down.  There was some bone bleeding on the left, controlled with a little bit of bone wax laterally and some Surgiflo.  Epidural space was completely dry at the time of graft insertion 6 mm graft countersunk.  A 28, 30, and then finally 32 mm plate was  tried, 32 gave good fit, checked under C-arm after spike was placed on the left at C5.  Holes were drilled.  Screws 12 mm were placed, checked under fluoroscopy, good position.  Screws were locked in-and-out technique used for Hemovac drain in line with the skin incision.  Platysma was closed with 3-0 Vicryl, 4-0 Vicryl, subcuticular closure.  Tincture of benzoin, Steri- Strips, 4x4, tape, and soft collar was applied.  Instrument count and needle count was correct.     Caroline Wong, M.D.     MCY/MEDQ  D:  06/05/2015  T:  06/05/2015  Job:  [id number]

## 2015-06-05 NOTE — Anesthesia Postprocedure Evaluation (Signed)
Anesthesia Post Note  Patient: Caroline Wong  Procedure(s) Performed: Procedure(s) (LRB): C5-6, C6-7 Anterior Cervical Discectomy and Fusion, Allograft, Plate (N/A)  Patient location during evaluation: PACU Anesthesia Type: General Level of consciousness: awake and alert Pain management: pain level controlled Vital Signs Assessment: post-procedure vital signs reviewed and stable Respiratory status: spontaneous breathing Cardiovascular status: blood pressure returned to baseline Anesthetic complications: no    Last Vitals:  Filed Vitals:   06/05/15 1045 06/05/15 1108  BP:  133/71  Pulse: 62 83  Temp:  36.6 C  Resp: 10 16    Last Pain:  Filed Vitals:   06/05/15 1116  PainSc: Asleep                 Tiajuana Amass

## 2015-06-05 NOTE — Progress Notes (Signed)
ANTIBIOTIC CONSULT NOTE - INITIAL  Pharmacy Consult forvancomycin Indication: post-op prophylaxis  Allergies  Allergen Reactions  . Bee Venom Swelling  . Biaxin [Clarithromycin] Swelling  . Chloraprep One Step [Chlorhexidine Gluconate] Rash  . Keflex [Cephalexin] Rash    Patient Measurements: Height: 5' 7.5" (171.5 cm) Weight: 202 lb (91.627 kg) IBW/kg (Calculated) : 62.75  Assessment: 54 y/o female s/p C5-6, C6-7 anterior cervical diskectomy and fusion to receive 1 dose of vancomycin post-op. No drain per op note. Renal function is normal per labs from 12/21. Pre-op dose given ~07:00.  Plan:  - Vancomycin 1250 mg IV once at 19:00 tonight - Pharmacy signing off  [facility], Pharm.D., BCPS Clinical Pharmacist Pager: [phone] 06/05/2015 1:07 PM

## 2015-06-05 NOTE — Interval H&P Note (Signed)
History and Physical Interval Note:  06/05/2015 7:26 AM  Caroline Wong  has presented today for surgery, with the diagnosis of C5-6, C6-7 Spondylosis, Congenital Stenosis  The various methods of treatment have been discussed with the patient and family. After consideration of risks, benefits and other options for treatment, the patient has consented to  Procedure(s): C5-6, C6-7 Anterior Cervical Discectomy and Fusion, Allograft, Plate (N/A) as a surgical intervention .  The patient's history has been reviewed, patient examined, no change in status, stable for surgery.  I have reviewed the patient's chart and labs.  Questions were answered to the patient's satisfaction.     Lucyle Alumbaugh C

## 2015-06-05 NOTE — Brief Op Note (Signed)
06/05/2015  10:05 AM  PATIENT:  Caroline Wong  54 y.o. female  PRE-OPERATIVE DIAGNOSIS:  C5-6, C6-7 Spondylosis, Congenital Stenosis  POST-OPERATIVE DIAGNOSIS:  C5-6, C6-7 Spondylosis, Congenital Stenosis  PROCEDURE:  Procedure(s): C5-6, C6-7 Anterior Cervical Discectomy and Fusion, Allograft, Plate (N/A)  SURGEON:  Surgeon(s) and Role:    * [name], MD - Primary  PHYSICIAN ASSISTANT: Gomer France m. Ricard Dillon   ANESTHESIA:   general  EBL:  Total I/O In: 1200 [I.V.:1200] Out: 100 [Blood:100]  BLOOD ADMINISTERED:none  DRAINS: none   LOCAL MEDICATIONS USED:  NONE  SPECIMEN:  No Specimen  DISPOSITION OF SPECIMEN:  N/A  COUNTS:  YES  TOURNIQUET:  * No tourniquets in log *  DICTATION: .Dragon Dictation  PLAN OF CARE: Admit for overnight observation  PATIENT DISPOSITION:  PACU - hemodynamically stable.

## 2015-06-05 NOTE — Anesthesia Procedure Notes (Signed)
Procedure Name: Intubation Date/Time: 06/05/2015 7:37 AM Performed by: Terrill Mohr Pre-anesthesia Checklist: Emergency Drugs available, Patient identified, Suction available and Patient being monitored Patient Re-evaluated:Patient Re-evaluated prior to inductionOxygen Delivery Method: Circle system utilized Preoxygenation: Pre-oxygenation with 100% oxygen Intubation Type: IV induction Laryngoscope Size: Mac and 3 Grade View: Grade I Tube type: Oral Tube size: 7.5 mm Number of attempts: 1 Airway Equipment and Method: Stylet Placement Confirmation: ETT inserted through vocal cords under direct vision,  positive ETCO2 and breath sounds checked- equal and bilateral Secured at: 21 (cm at teeth) cm Tube secured with: Tape Dental Injury: Teeth and Oropharynx as per pre-operative assessment

## 2015-06-06 DIAGNOSIS — M47812 Spondylosis without myelopathy or radiculopathy, cervical region: Secondary | ICD-10-CM | POA: Diagnosis not present

## 2015-06-06 NOTE — Clinical Social Work Note (Signed)
CSW received referral for SNF.  Case discussed with case manager and plan is to discharge home.  CSW to sign off please re-consult if social work needs arise.  Leibish Mcgregor R. Adonus Uselman, MSW, LCSWA [phone]  

## 2015-06-06 NOTE — Progress Notes (Signed)
Utilization Review Completed.Caroline Wong T12/31/2016  

## 2015-06-06 NOTE — Progress Notes (Signed)
Orthopedic Tech Progress Note Patient Details:  Caroline Wong 1960/06/20 [id number]  Ortho Devices Type of Ortho Device: Soft collar Ortho Device/Splint Interventions: Application   [place] Pink 06/06/2015, 11:39 AM

## 2015-06-06 NOTE — Progress Notes (Signed)
[name] discharged home per MD order. Discharge instructions reviewed and discussed with patient. All questions and concerns answered. Copy of instructions and scripts given to patient. IV removed.  Patient escorted to car by staff in a wheelchair. No distress noted upon discharge.   Tarri Abernethy R 06/06/2015 12:16 PM

## 2015-06-06 NOTE — Progress Notes (Signed)
Patient ID: Caroline Wong, female   DOB: 26-Jan-1961, 54 y.o.   MRN: [id number] No acute changes.  Doing well.  Swallowing fine.  Drain came out last evening.  Grossly intact neuro UE and LE.  Incision clean and dry.  Can be discharged to home today if comfortable.

## 2015-06-09 ENCOUNTER — Encounter (HOSPITAL_COMMUNITY): Payer: Self-pay | Admitting: Orthopaedic Surgery

## 2015-06-16 NOTE — Discharge Summary (Signed)
Patient ID: Caroline Wong MRN: [id number] DOB/AGE: January 27, 1961 55 y.o.  Admit date: 06/05/2015 Discharge date: 06/16/2015  Admission Diagnoses:  Active Problems:   S/P cervical spinal fusion   Discharge Diagnoses:  Active Problems:   S/P cervical spinal fusion  status post Procedure(s): C5-6, C6-7 Anterior Cervical Discectomy and Fusion, Allograft, Plate  Past Medical History  Diagnosis Date  . Chronic bronchitis ([place])   . Hypertension   . Hormone replacement therapy (HRT)   . Abdominal pain 11/25/2013    Has some nausea associated with pain that has been on and off x 2 weeks will get [place]  . Gallstones   . Night sweats 04/14/2014  . Yeast infection 05/07/2014  . Obesity 05/07/2014  . Anxiety     Takes Lorazepam  . GERD (gastroesophageal reflux disease)     Surgeries: Procedure(s): C5-6, C6-7 Anterior Cervical Discectomy and Fusion, Allograft, Plate on [id number]-GRM   Consultants:    Discharged Condition: Improved  Hospital Course: Caroline Wong is an 55 y.o. female who was admitted 06/05/2015 for operative treatment of cervical stenosis. Patient failed conservative treatments (please see the history and physical for the specifics) and had severe unremitting pain that affects sleep, daily activities and work/hobbies. After pre-op clearance, the patient was taken to the operating room on 06/05/2015 and underwent  Procedure(s): C5-6, C6-7 Anterior Cervical Discectomy and Fusion, Allograft, Plate.    Patient was given perioperative antibiotics:  Anti-infectives    Start     Dose/Rate Route Frequency Ordered Stop   06/05/15 1900  vancomycin (VANCOCIN) 1,250 mg in sodium chloride 0.9 % 250 mL IVPB     1,250 mg 166.7 mL/hr over 90 Minutes Intravenous  Once 06/05/15 1306 06/05/15 2040   06/05/15 1200  valACYclovir (VALTREX) tablet 1,000 mg  Status:  Discontinued     1,000 mg Oral Daily 06/05/15 1122 06/06/15 1516   06/05/15 0700  vancomycin (VANCOCIN) IVPB 1000 mg/200 mL  premix     1,000 mg 200 mL/hr over 60 Minutes Intravenous To [facility] 06/04/15 1159 06/05/15 0810       Patient was given sequential compression devices and early ambulation to prevent DVT.   Patient benefited maximally from hospital stay and there were no complications. At the time of discharge, the patient was urinating/moving their bowels without difficulty, tolerating a regular diet, pain is controlled with oral pain medications and they have been cleared by PT/OT.   Recent vital signs: No data found.    Recent laboratory studies: No results for input(s): WBC, HGB, HCT, PLT, NA, K, CL, CO2, BUN, CREATININE, GLUCOSE, INR, CALCIUM in the last 72 hours.  Invalid input(s): PT, 2   Discharge Medications:     Medication List    STOP taking these medications        aspirin 325 MG tablet  Commonly known as:  BAYER ASPIRIN     cyclobenzaprine 5 MG tablet  Commonly known as:  FLEXERIL     phentermine 37.5 MG capsule     predniSONE 10 MG tablet  Commonly known as:  DELTASONE     progesterone 200 MG capsule  Commonly known as:  PROMETRIUM     pseudoephedrine 30 MG tablet  Commonly known as:  SUDAFED     traMADol 50 MG tablet  Commonly known as:  ULTRAM      TAKE these medications        amLODipine 5 MG tablet  Commonly known as:  NORVASC  Take 1 tablet (  5 mg total) by mouth daily.     cholecalciferol 1000 units tablet  Commonly known as:  VITAMIN D  Take 1 tablet (1,000 Units total) by mouth daily.     diphenhydrAMINE 25 MG tablet  Commonly known as:  BENADRYL  Take 2 tablets (50 mg total) by mouth every 4 (four) hours as needed (bee sting).     ELESTRIN 0.52 MG/0.87 GM (0.06%) Gel  Generic drug:  Estradiol  APPLY 1 APPLICATION DAILY     LORazepam 0.5 MG tablet  Commonly known as:  ATIVAN  take 1 tablet by mouth at bedtime     losartan-hydrochlorothiazide 100-12.5 MG tablet  Commonly known as:  HYZAAR  Take 1 tablet by mouth daily.      methocarbamol 500 MG tablet  Commonly known as:  ROBAXIN  Take 1 tablet (500 mg total) by mouth 4 (four) times daily.     metoprolol succinate 25 MG 24 hr tablet  Commonly known as:  TOPROL-XL  Take 0.5 tablets (12.5 mg total) by mouth daily. Take at bedtime     oxyCODONE-acetaminophen 5-325 MG tablet  Commonly known as:  ROXICET  Take 1 tablet by mouth every 6 (six) hours as needed for severe pain.     pantoprazole 40 MG tablet  Commonly known as:  PROTONIX  Take 1 tablet (40 mg total) by mouth daily.     valACYclovir 1000 MG tablet  Commonly known as:  VALTREX  take 1 tablet by mouth once daily        Diagnostic Studies: Dg Chest 2 View  05/27/2015  CLINICAL DATA:  Cervical spondylosis. EXAM: CHEST  2 VIEW COMPARISON:  None. FINDINGS: The heart size and mediastinal contours are within normal limits. Both lungs are clear. The visualized skeletal structures are unremarkable. IMPRESSION: No active cardiopulmonary disease. Electronically Signed   By: Franchot Gallo M.D.   On: 05/27/2015 09:01   Dg Cervical Spine 2-3 Views  06/05/2015  CLINICAL DATA:  Status post anterior cervical discectomy and fusion at the C5 through C7 levels. EXAM: CERVICAL SPINE - 2-3 VIEW; DG C-ARM 61-120 MIN COMPARISON:  None. FINDINGS: Frontal and lateral C-arm views of the cervical spine are submitted for interpretation. These demonstrate interbody bone plug and anterior screw and plate fusion at the C5 through C7 levels. The C7 level is not visualized on the lateral view due to overlapping of the patient's shoulders. Normal alignment to that level. IMPRESSION: Anterior cervical fusion at the C5 through C7 levels. Routine views are recommended when possible. Electronically Signed   By: Claudie Revering M.D.   On: 06/05/2015 09:52   Dg C-arm 1-60 Min  06/05/2015  CLINICAL DATA:  Status post anterior cervical discectomy and fusion at the C5 through C7 levels. EXAM: CERVICAL SPINE - 2-3 VIEW; DG C-ARM 61-120 MIN  COMPARISON:  None. FINDINGS: Frontal and lateral C-arm views of the cervical spine are submitted for interpretation. These demonstrate interbody bone plug and anterior screw and plate fusion at the C5 through C7 levels. The C7 level is not visualized on the lateral view due to overlapping of the patient's shoulders. Normal alignment to that level. IMPRESSION: Anterior cervical fusion at the C5 through C7 levels. Routine views are recommended when possible. Electronically Signed   By: Claudie Revering M.D.   On: 06/05/2015 09:52        Discharge Instructions    Call MD / Call 911    Complete by:  As directed   If you  experience chest pain or shortness of breath, CALL 911 and be transported to the hospital emergency room.  If you develope a fever above 101 F, pus (white drainage) or increased drainage or redness at the wound, or calf pain, call your surgeon's office.     Constipation Prevention    Complete by:  As directed   Drink plenty of fluids.  Prune juice may be helpful.  You may use a stool softener, such as Colace (over the counter) 100 mg twice a day.  Use MiraLax (over the counter) for constipation as needed.     Diet - low sodium heart healthy    Complete by:  As directed      Discharge instructions    Complete by:  As directed   Ok to shower 5 days postop.  Do not apply any creams or ointments to incision.  Do not remove steri-strips.  Can use 4x4 gauze and tape for dressing changes.  No aggressive activity. Cervical collar must be on at all times even when showering.  Do not bend or turn neck.     Discharge patient    Complete by:  As directed      Driving restrictions    Complete by:  As directed   No driving     Increase activity slowly as tolerated    Complete by:  As directed      Lifting restrictions    Complete by:  As directed   No lifting           Follow-up Information    Schedule an appointment as soon as possible for a visit with [name], MD.   Specialty:   Orthopedic Surgery   Why:  need return office visit one week postop   Contact information:   [place] [address] [phone]       Discharge Plan:  discharge to home   Disposition:     Signed: Lanae Crumbly 06/16/2015, 1:49 PM

## 2015-06-19 ENCOUNTER — Other Ambulatory Visit: Payer: Self-pay | Admitting: Obstetrics & Gynecology

## 2015-07-08 ENCOUNTER — Ambulatory Visit (INDEPENDENT_AMBULATORY_CARE_PROVIDER_SITE_OTHER): Payer: Federal, State, Local not specified - PPO | Admitting: Nurse Practitioner

## 2015-07-08 VITALS — BP 148/96 | HR 95 | Temp 98.4°F | Ht 67.0 in | Wt 212.0 lb

## 2015-07-08 DIAGNOSIS — R109 Unspecified abdominal pain: Secondary | ICD-10-CM

## 2015-07-08 MED ORDER — SUCRALFATE 1 GM/10ML PO SUSP: 1.0000 g | Freq: Three times a day (TID) | ORAL | Status: DC

## 2015-07-08 MED ORDER — ONDANSETRON 4 MG PO TBDP: 4.0000 mg | ORAL_TABLET | Freq: Three times a day (TID) | ORAL | Status: DC | PRN

## 2015-07-08 NOTE — Progress Notes (Signed)
Pre visit review using our clinic review tool, if applicable. No additional management support is needed unless otherwise documented below in the visit note. 

## 2015-07-08 NOTE — Progress Notes (Signed)
Patient ID: Caroline Wong, female    DOB: Dec 11, 1960  Age: 55 y.o. MRN: [id number]  CC: Nausea   HPI Caroline Wong presents for CC of abdominal cramps and nausea.   1) patient reports epigastric pain, abdominal cramps, and nausea for 4 days. Last BM was 07/06/2015 and was normal for patient   2:39 pm took Zofran today and was helpful  Monday night epigastric pain described as achy 4-5/10   Pain started about 40 minutes approximately after eating and knows this because she texted her husband that she was going to take something for this.  Belching all of a sudden after eating and drinking   Omeprazole 40 mg not helping   History Real has a past medical history of Chronic bronchitis ([place]); Hypertension; Hormone replacement therapy (HRT); Abdominal pain (11/25/2013); Gallstones; Night sweats (04/14/2014); Yeast infection (05/07/2014); Obesity (05/07/2014); Anxiety; and GERD (gastroesophageal reflux disease).   She has past surgical history that includes Wisdom tooth extraction; Mole removal; Cholecystectomy; Foot surgery (Left); Colonoscopy; Anterior fusion cervical spine (06/05/2015); and Anterior cervical decomp/discectomy fusion (N/A, 06/05/2015).   Her family history includes Arthritis in her maternal grandfather, maternal grandmother, paternal grandfather, and paternal grandmother; Asthma in her maternal grandmother; Breast cancer in her mother and paternal grandmother; Cancer in her maternal grandfather; Colon cancer in her paternal grandfather; Diabetes in her brother, maternal grandmother, mother, and paternal grandfather; Emphysema in her father; Fibromyalgia in her mother; Heart disease in her father, maternal grandfather, maternal grandmother, mother, paternal grandfather, and paternal grandmother; Hypertension in her brother, brother, and mother; Kidney disease in her mother; Stomach cancer in her paternal grandfather; Stroke in her paternal grandmother. There is no history of  Esophageal cancer, Pancreatic cancer, or Rectal cancer.She reports that she has never smoked. She has never used smokeless tobacco. She reports that she drinks alcohol. She reports that she does not use illicit drugs.  Outpatient Prescriptions Prior to Visit  Medication Sig Dispense Refill  . amLODipine (NORVASC) 5 MG tablet Take 1 tablet (5 mg total) by mouth daily. 30 tablet 11  . cholecalciferol (VITAMIN D) 1000 UNITS tablet Take 1 tablet (1,000 Units total) by mouth daily. 100 tablet 3  . diphenhydrAMINE (BENADRYL) 25 MG tablet Take 2 tablets (50 mg total) by mouth every 4 (four) hours as needed (bee sting). 30 tablet 3  . ELESTRIN 0.52 MG/0.87 GM (0.06%) GEL APPLY 1 APPLICATION DAILY 52 g 6  . LORazepam (ATIVAN) 0.5 MG tablet take 1 tablet by mouth at bedtime 30 tablet 5  . losartan-hydrochlorothiazide (HYZAAR) 100-12.5 MG tablet Take 1 tablet by mouth daily. 30 tablet 11  . metoprolol succinate (TOPROL-XL) 25 MG 24 hr tablet Take 0.5 tablets (12.5 mg total) by mouth daily. Take at bedtime (Patient taking differently: Take 25 mg by mouth daily. ) 30 tablet 11  . valACYclovir (VALTREX) 1000 MG tablet take 1 tablet by mouth once daily 30 tablet PRN  . methocarbamol (ROBAXIN) 500 MG tablet Take 1 tablet (500 mg total) by mouth 4 (four) times daily. (Patient not taking: Reported on 07/08/2015) 50 tablet 0  . oxyCODONE-acetaminophen (ROXICET) 5-325 MG tablet Take 1 tablet by mouth every 6 (six) hours as needed for severe pain. (Patient not taking: Reported on 07/08/2015) 60 tablet 0  . pantoprazole (PROTONIX) 40 MG tablet Take 1 tablet (40 mg total) by mouth daily. (Patient not taking: Reported on 07/08/2015) 30 tablet 3   Facility-Administered Medications Prior to Visit  Medication Dose Route Frequency Provider Last Rate Last  Dose  . aminophylline injection 75 mg  75 mg Intravenous BID PRN Dorothy Spark, MD   75 mg at 04/13/15 1037    ROS Review of Systems  Constitutional: Negative for fever,  chills, diaphoresis and fatigue.  Respiratory: Negative for chest tightness, shortness of breath and wheezing.   Cardiovascular: Negative for chest pain, palpitations and leg swelling.  Gastrointestinal: Positive for nausea and abdominal pain. Negative for vomiting, diarrhea, constipation, blood in stool, abdominal distention, anal bleeding and rectal pain.  Skin: Negative for rash.  Neurological: Negative for dizziness and headaches.    Objective:  BP 148/96 mmHg  Pulse 95  Temp(Src) 98.4 F (36.9 C) (Oral)  Ht 5\' 7"  (1.702 m)  Wt 212 lb (96.163 kg)  BMI 33.20 kg/m2  SpO2 97%  Physical Exam  Constitutional: She is oriented to person, place, and time. She appears well-developed and well-nourished. No distress.  HENT:  Head: Normocephalic and atraumatic.  Right Ear: External ear normal.  Left Ear: External ear normal.  Cardiovascular: Normal rate, regular rhythm and normal heart sounds.  Exam reveals no gallop and no friction rub.   No murmur heard. Pulmonary/Chest: Effort normal and breath sounds normal. No respiratory distress. She has no wheezes. She has no rales. She exhibits no tenderness.  Abdominal: Soft. Bowel sounds are normal. She exhibits no distension and no mass. There is tenderness. There is no rebound and no guarding.  Epigastric tenderness  Neurological: She is alert and oriented to person, place, and time. No cranial nerve deficit. She exhibits normal muscle tone. Coordination normal.  Skin: Skin is warm and dry. No rash noted. She is not diaphoretic.  Psychiatric: She has a normal mood and affect. Her behavior is normal. Judgment and thought content normal.   Assessment & Plan:   Caroline Wong was seen today for nausea.  Diagnoses and all orders for this visit:  Abdominal pain, unspecified abdominal location  Other orders -     sucralfate (CARAFATE) 1 GM/10ML suspension; Take 10 mLs (1 g total) by mouth 4 (four) times daily -  with meals and at bedtime. -      ondansetron (ZOFRAN ODT) 4 MG disintegrating tablet; Take 1 tablet (4 mg total) by mouth every 8 (eight) hours as needed for nausea or vomiting.  I am having Caroline Wong start on sucralfate and ondansetron. I am also having her maintain her valACYclovir, ELESTRIN, diphenhydrAMINE, cholecalciferol, pantoprazole, losartan-hydrochlorothiazide, amLODipine, metoprolol succinate, oxyCODONE-acetaminophen, methocarbamol, LORazepam, and traMADol.  Meds ordered this encounter  Medications  . traMADol (ULTRAM) 50 MG tablet    Sig: take 1 tablet by mouth every 6 hours if needed    Refill:  0  . sucralfate (CARAFATE) 1 GM/10ML suspension    Sig: Take 10 mLs (1 g total) by mouth 4 (four) times daily -  with meals and at bedtime.    Dispense:  420 mL    Refill:  0    Order Specific Question:  Supervising Provider    Answer:  Derrel Nip, TERESA L [[id number]]  . ondansetron (ZOFRAN ODT) 4 MG disintegrating tablet    Sig: Take 1 tablet (4 mg total) by mouth every 8 (eight) hours as needed for nausea or vomiting.    Dispense:  20 tablet    Refill:  0    Order Specific Question:  Supervising Provider    Answer:  Crecencio Mc [[id number]]     Follow-up: Return if symptoms worsen or fail to improve.

## 2015-07-08 NOTE — Patient Instructions (Signed)
Take 10 mL (2 teaspoons) 1 hr before meals and at bedtime for 2-3 days to see if it helps.    Whole 30 (can find the elimination diet info on their website)

## 2015-07-12 ENCOUNTER — Encounter: Payer: Self-pay | Admitting: Nurse Practitioner

## 2015-07-12 NOTE — Assessment & Plan Note (Signed)
Patient had similar symptoms in 2015. New problem to me Will treat with Carafate 4 times daily Bland foods and Gas-X. Follow-up closely with provider next week if no improvement

## 2015-07-15 ENCOUNTER — Encounter: Payer: Self-pay | Admitting: Internal Medicine

## 2015-07-15 ENCOUNTER — Ambulatory Visit (INDEPENDENT_AMBULATORY_CARE_PROVIDER_SITE_OTHER): Payer: Federal, State, Local not specified - PPO | Admitting: Internal Medicine

## 2015-07-15 VITALS — BP 148/90 | HR 95 | Wt 215.0 lb

## 2015-07-15 DIAGNOSIS — H00013 Hordeolum externum right eye, unspecified eyelid: Secondary | ICD-10-CM

## 2015-07-15 DIAGNOSIS — I10 Essential (primary) hypertension: Secondary | ICD-10-CM

## 2015-07-15 DIAGNOSIS — M542 Cervicalgia: Secondary | ICD-10-CM

## 2015-07-15 DIAGNOSIS — Z981 Arthrodesis status: Secondary | ICD-10-CM

## 2015-07-15 DIAGNOSIS — H00019 Hordeolum externum unspecified eye, unspecified eyelid: Secondary | ICD-10-CM | POA: Insufficient documentation

## 2015-07-15 MED ORDER — DOXYCYCLINE HYCLATE 100 MG PO TABS: 100.0000 mg | ORAL_TABLET | Freq: Two times a day (BID) | ORAL | Status: DC

## 2015-07-15 MED ORDER — PHENTERMINE HCL 37.5 MG PO TABS: 37.5000 mg | ORAL_TABLET | Freq: Every day | ORAL | Status: DC

## 2015-07-15 MED ORDER — OMEPRAZOLE 40 MG PO CPDR: 40.0000 mg | DELAYED_RELEASE_CAPSULE | Freq: Every day | ORAL | Status: DC

## 2015-07-15 NOTE — Assessment & Plan Note (Signed)
Doxy - 7d

## 2015-07-15 NOTE — Progress Notes (Signed)
Subjective:  Patient ID: Caroline Wong, female    DOB: 13-Mar-1961  Age: 55 y.o. MRN: [id number]  CC: No chief complaint on file.   HPI VICTOR SCHEINER presents for HA - better, neck pain post-op, HTN f/u  Outpatient Prescriptions Prior to Visit  Medication Sig Dispense Refill  . amLODipine (NORVASC) 5 MG tablet Take 1 tablet (5 mg total) by mouth daily. 30 tablet 11  . cholecalciferol (VITAMIN D) 1000 UNITS tablet Take 1 tablet (1,000 Units total) by mouth daily. 100 tablet 3  . diphenhydrAMINE (BENADRYL) 25 MG tablet Take 2 tablets (50 mg total) by mouth every 4 (four) hours as needed (bee sting). 30 tablet 3  . ELESTRIN 0.52 MG/0.87 GM (0.06%) GEL APPLY 1 APPLICATION DAILY 52 g 6  . LORazepam (ATIVAN) 0.5 MG tablet take 1 tablet by mouth at bedtime 30 tablet 5  . losartan-hydrochlorothiazide (HYZAAR) 100-12.5 MG tablet Take 1 tablet by mouth daily. 30 tablet 11  . metoprolol succinate (TOPROL-XL) 25 MG 24 hr tablet Take 0.5 tablets (12.5 mg total) by mouth daily. Take at bedtime (Patient taking differently: Take 25 mg by mouth daily. ) 30 tablet 11  . valACYclovir (VALTREX) 1000 MG tablet take 1 tablet by mouth once daily 30 tablet PRN  . methocarbamol (ROBAXIN) 500 MG tablet Take 1 tablet (500 mg total) by mouth 4 (four) times daily. (Patient not taking: Reported on 07/15/2015) 50 tablet 0  . ondansetron (ZOFRAN ODT) 4 MG disintegrating tablet Take 1 tablet (4 mg total) by mouth every 8 (eight) hours as needed for nausea or vomiting. (Patient not taking: Reported on 07/15/2015) 20 tablet 0  . traMADol (ULTRAM) 50 MG tablet Reported on 07/15/2015  0  . oxyCODONE-acetaminophen (ROXICET) 5-325 MG tablet Take 1 tablet by mouth every 6 (six) hours as needed for severe pain. (Patient not taking: Reported on 07/15/2015) 60 tablet 0  . pantoprazole (PROTONIX) 40 MG tablet Take 1 tablet (40 mg total) by mouth daily. (Patient not taking: Reported on 07/15/2015) 30 tablet 3  . sucralfate (CARAFATE) 1  GM/10ML suspension Take 10 mLs (1 g total) by mouth 4 (four) times daily -  with meals and at bedtime. (Patient not taking: Reported on 07/15/2015) 420 mL 0   Facility-Administered Medications Prior to Visit  Medication Dose Route Frequency Provider Last Rate Last Dose  . aminophylline injection 75 mg  75 mg Intravenous BID PRN Dorothy Spark, MD   75 mg at 04/13/15 1037    ROS Review of Systems  Constitutional: Positive for unexpected weight change. Negative for chills, activity change, appetite change and fatigue.  HENT: Negative for congestion, mouth sores and sinus pressure.   Eyes: Negative for visual disturbance.  Respiratory: Negative for cough and chest tightness.   Gastrointestinal: Negative for nausea and abdominal pain.  Genitourinary: Negative for frequency, difficulty urinating and vaginal pain.  Musculoskeletal: Positive for neck pain and neck stiffness. Negative for back pain and gait problem.  Skin: Negative for pallor and rash.  Neurological: Negative for dizziness, tremors, weakness, numbness and headaches.  Psychiatric/Behavioral: Negative for suicidal ideas, confusion and sleep disturbance. The patient is not nervous/anxious.     Objective:  BP 148/90 mmHg  Pulse 95  Wt 215 lb (97.523 kg)  SpO2 97%  BP Readings from Last 3 Encounters:  07/15/15 148/90  07/08/15 148/96  06/06/15 137/80    Wt Readings from Last 3 Encounters:  07/15/15 215 lb (97.523 kg)  07/08/15 212 lb (96.163 kg)  06/05/15 202 lb (91.627 kg)    Physical Exam  Constitutional: She appears well-developed. No distress.  HENT:  Head: Normocephalic.  Right Ear: External ear normal.  Left Ear: External ear normal.  Nose: Nose normal.  Mouth/Throat: Oropharynx is clear and moist.  Eyes: Conjunctivae are normal. Pupils are equal, round, and reactive to light. Right eye exhibits no discharge. Left eye exhibits no discharge.  Neck: Normal range of motion. Neck supple. No JVD present. No  tracheal deviation present. No thyromegaly present.  Cardiovascular: Normal rate, regular rhythm and normal heart sounds.   Pulmonary/Chest: No stridor. No respiratory distress. She has no wheezes.  Abdominal: Soft. Bowel sounds are normal. She exhibits no distension and no mass. There is no tenderness. There is no rebound and no guarding.  Musculoskeletal: She exhibits tenderness. She exhibits no edema.  Lymphadenopathy:    She has no cervical adenopathy.  Neurological: She displays normal reflexes. No cranial nerve deficit. She exhibits normal muscle tone. Coordination normal.  Skin: No rash noted. No erythema.  Psychiatric: She has a normal mood and affect. Her behavior is normal. Judgment and thought content normal.  neck is in a soft collar Obese R lower eyelid 2 mm nodule  Lab Results  Component Value Date   WBC 8.2 05/27/2015   HGB 15.5* 05/27/2015   HCT 45.1 05/27/2015   PLT 288 05/27/2015   GLUCOSE 122* 05/27/2015   CHOL 183 10/30/2013   TRIG 93.0 10/30/2013   HDL 57.10 10/30/2013   LDLCALC 107* 10/30/2013   ALT 20 05/27/2015   AST 20 05/27/2015   NA 139 05/27/2015   K 3.9 05/27/2015   CL 104 05/27/2015   CREATININE 0.90 05/27/2015   BUN 27* 05/27/2015   CO2 25 05/27/2015   TSH 0.60 05/13/2015   INR 1.02 05/27/2015    Dg Chest 2 View  05/27/2015  CLINICAL DATA:  Cervical spondylosis. EXAM: CHEST  2 VIEW COMPARISON:  None. FINDINGS: The heart size and mediastinal contours are within normal limits. Both lungs are clear. The visualized skeletal structures are unremarkable. IMPRESSION: No active cardiopulmonary disease. Electronically Signed   By: Franchot Gallo M.D.   On: 05/27/2015 09:01    Assessment & Plan:   Diagnoses and all orders for this visit:  Essential hypertension  Neck pain, bilateral posterior  S/P cervical spinal fusion  Stye external, right  Other orders -     doxycycline (VIBRA-TABS) 100 MG tablet; Take 1 tablet (100 mg total) by mouth 2  (two) times daily. -     phentermine (ADIPEX-P) 37.5 MG tablet; Take 1 tablet (37.5 mg total) by mouth daily before breakfast. -     omeprazole (PRILOSEC) 40 MG capsule; Take 1 capsule (40 mg total) by mouth daily.  I have discontinued Ms. Colvard's pantoprazole, oxyCODONE-acetaminophen, and sucralfate. I have also changed her phentermine and omeprazole. Additionally, I am having her start on doxycycline. Lastly, I am having her maintain her valACYclovir, ELESTRIN, diphenhydrAMINE, cholecalciferol, losartan-hydrochlorothiazide, amLODipine, metoprolol succinate, methocarbamol, LORazepam, traMADol, ondansetron, aspirin, and progesterone.  Meds ordered this encounter  Medications  . aspirin 325 MG EC tablet    Sig: Take 325 mg by mouth daily.  [name] DISCONTD: omeprazole (PRILOSEC) 40 MG capsule    Sig: Take 40 mg by mouth daily.  . progesterone (PROMETRIUM) 200 MG capsule    Sig: Take 1 capsule by mouth at bedtime.    Refill:  1  . DISCONTD: phentermine (ADIPEX-P) 37.5 MG tablet    Sig: Take 1  tablet by mouth daily.    Refill:  0  . doxycycline (VIBRA-TABS) 100 MG tablet    Sig: Take 1 tablet (100 mg total) by mouth 2 (two) times daily.    Dispense:  14 tablet    Refill:  0  . phentermine (ADIPEX-P) 37.5 MG tablet    Sig: Take 1 tablet (37.5 mg total) by mouth daily before breakfast.    Dispense:  30 tablet    Refill:  2  . omeprazole (PRILOSEC) 40 MG capsule    Sig: Take 1 capsule (40 mg total) by mouth daily.    Dispense:  30 capsule    Refill:  11     Follow-up: Return in about 4 months (around 11/12/2015) for a follow-up visit.  Walker Kehr, MD
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
## 2015-07-15 NOTE — Progress Notes (Signed)
Pre visit review using our clinic review tool, if applicable. No additional management support is needed unless otherwise documented below in the visit note. 

## 2015-07-15 NOTE — Assessment & Plan Note (Signed)
Hyzaar 100-12.5 Amlodipine 5 mg/d Toprol XL 1/2 tab qd 12.5 mg

## 2015-08-04 ENCOUNTER — Encounter: Payer: Self-pay | Admitting: Internal Medicine

## 2015-08-05 MED ORDER — DOXYCYCLINE HYCLATE 100 MG PO TABS: 100.0000 mg | ORAL_TABLET | Freq: Two times a day (BID) | ORAL | Status: DC

## 2015-08-19 ENCOUNTER — Encounter: Payer: Self-pay | Admitting: Adult Health

## 2015-08-19 ENCOUNTER — Ambulatory Visit (INDEPENDENT_AMBULATORY_CARE_PROVIDER_SITE_OTHER): Payer: Federal, State, Local not specified - PPO | Admitting: Adult Health

## 2015-08-19 ENCOUNTER — Other Ambulatory Visit (HOSPITAL_COMMUNITY)
Admission: RE | Admit: 2015-08-19 | Discharge: 2015-08-19 | Disposition: A | Discharge status: Home / Self Care | Payer: Federal, State, Local not specified - PPO | Source: Ambulatory Visit | Attending: Adult Health | Admitting: Adult Health

## 2015-08-19 VITALS — BP 146/100 | HR 84 | Ht 67.25 in | Wt 213.5 lb

## 2015-08-19 DIAGNOSIS — Z01419 Encounter for gynecological examination (general) (routine) without abnormal findings: Secondary | ICD-10-CM | POA: Insufficient documentation

## 2015-08-19 DIAGNOSIS — Z01411 Encounter for gynecological examination (general) (routine) with abnormal findings: Secondary | ICD-10-CM

## 2015-08-19 DIAGNOSIS — N3941 Urge incontinence: Secondary | ICD-10-CM | POA: Insufficient documentation

## 2015-08-19 DIAGNOSIS — Z1212 Encounter for screening for malignant neoplasm of rectum: Secondary | ICD-10-CM | POA: Diagnosis not present

## 2015-08-19 DIAGNOSIS — Z1151 Encounter for screening for human papillomavirus (HPV): Secondary | ICD-10-CM | POA: Diagnosis present

## 2015-08-19 DIAGNOSIS — Z79890 Hormone replacement therapy (postmenopausal): Secondary | ICD-10-CM

## 2015-08-19 DIAGNOSIS — R5383 Other fatigue: Secondary | ICD-10-CM | POA: Diagnosis not present

## 2015-08-19 HISTORY — DX: Urge incontinence: N39.41

## 2015-08-19 LAB — HEMOCCULT GUIAC POC 1CARD (OFFICE): FECAL OCCULT BLD: NEGATIVE

## 2015-08-19 MED ORDER — ESTRADIOL 1.53 MG/SPRAY TD SOLN: 1.0000 | Freq: Every day | TRANSDERMAL | Status: DC

## 2015-08-19 MED ORDER — SOLIFENACIN SUCCINATE 10 MG PO TABS: 10.0000 mg | ORAL_TABLET | Freq: Every day | ORAL | Status: DC

## 2015-08-19 MED ORDER — PROGESTERONE MICRONIZED 200 MG PO CAPS: 200.0000 mg | ORAL_CAPSULE | Freq: Every day | ORAL | Status: DC

## 2015-08-19 NOTE — Progress Notes (Signed)
Patient ID: Caroline Wong, female   DOB: 1960-12-31, 55 y.o.   MRN: [id number] History of Present Illness: [name] is a 55 year old white female, married, in for well woman gyn exam and pap.She is complaining of fatigue and not sleeping as well and some urge incontinence.She does not think hormones working as well, and the elestrin gel is $90 per month now. PCP is Dr Alain Marion.  Current Medications, Allergies, Past Medical History, Past Surgical History, Family History and Social History were reviewed in [employer] record.     Review of Systems: Patient denies any headaches, hearing loss, blurred vision, shortness of breath, chest pain, abdominal pain, problems with bowel movements, or intercourse. No joint pain or mood swings. See HPI for positives.   Physical Exam:BP 146/100 mmHg  Pulse 84  Ht 5' 7.25" (1.708 m)  Wt 213 lb 8 oz (96.843 kg)  BMI 33.20 kg/m2  LMP 08/16/2015 General:  Well developed, well nourished, no acute distress Skin:  Warm and dry Neck:  Midline trachea, normal thyroid, good ROM, no lymphadenopathy Lungs; Clear to auscultation bilaterally Breast:  No dominant palpable mass, retraction, or nipple discharge Cardiovascular: Regular rate and rhythm Abdomen:  Soft, non tender, no hepatosplenomegaly Pelvic:  External genitalia is normal in appearance, no lesions.  The vagina is normal in appearance. Urethra has no lesions or masses. The cervix is bulbous.Has brown period like blood, pap with HPV performed.  Uterus is felt to be normal size, shape, and contour.  No adnexal masses or tenderness noted.Bladder is non tender, no masses felt. Rectal: Good sphincter tone, no polyps, or hemorrhoids felt.  Hemoccult negative. Extremities/musculoskeletal:  No swelling or varicosities noted, no clubbing or cyanosis Psych:  No mood changes, alert and cooperative,seems happy   Impression: Well woman gyn exam and pap HRT UI  Fatigue      Plan: Stop  elestrin Rx eva mist 1 spray daily with 11 refills, can in creae to 2-3 sprays if needed Do kegels Refilled prometrium 200 mg #30 take 1 daily with 11 refills Rx vesicare 10 mg #30 take 1 daily with 6 refills Physical in 1 year, pap in 3 if normal Mammogram yearly Labs with PCP

## 2015-08-19 NOTE — Patient Instructions (Addendum)
Try eva mist Physical in 1 year, pap in 3 if normal  Mammogram yearly Do kegels

## 2015-08-21 LAB — CYTOLOGY - PAP

## 2015-08-22 ENCOUNTER — Encounter: Payer: Self-pay | Admitting: Internal Medicine

## 2015-08-25 ENCOUNTER — Encounter: Payer: Self-pay | Admitting: Adult Health

## 2015-08-25 ENCOUNTER — Other Ambulatory Visit: Payer: Self-pay | Admitting: Internal Medicine

## 2015-08-25 MED ORDER — METOPROLOL SUCCINATE ER 25 MG PO TB24: 25.0000 mg | ORAL_TABLET | Freq: Every day | ORAL | Status: DC

## 2015-09-09 ENCOUNTER — Encounter: Payer: Self-pay | Admitting: Internal Medicine

## 2015-09-15 ENCOUNTER — Other Ambulatory Visit: Payer: Self-pay | Admitting: Internal Medicine

## 2015-09-16 ENCOUNTER — Other Ambulatory Visit: Payer: Self-pay | Admitting: *Deleted

## 2015-09-16 NOTE — Telephone Encounter (Signed)
Rf req for Tramadol 50 mg 1 po q 6 hr prn. Ok to Rf?

## 2015-09-18 ENCOUNTER — Other Ambulatory Visit: Payer: Self-pay | Admitting: Adult Health

## 2015-09-21 MED ORDER — TRAMADOL HCL 50 MG PO TABS: 25.0000 mg | ORAL_TABLET | Freq: Two times a day (BID) | ORAL | Status: DC | PRN

## 2015-09-21 NOTE — Telephone Encounter (Signed)
Done

## 2015-09-23 ENCOUNTER — Encounter: Payer: Self-pay | Admitting: Internal Medicine

## 2015-09-24 ENCOUNTER — Telehealth: Payer: Self-pay

## 2015-09-24 ENCOUNTER — Institutional Professional Consult (permissible substitution): Payer: Federal, State, Local not specified - PPO | Admitting: General Practice

## 2015-09-24 ENCOUNTER — Telehealth: Payer: Self-pay | Admitting: Internal Medicine

## 2015-09-24 ENCOUNTER — Emergency Department (HOSPITAL_COMMUNITY): Payer: Federal, State, Local not specified - PPO

## 2015-09-24 ENCOUNTER — Emergency Department (HOSPITAL_COMMUNITY)
Admission: EM | Admit: 2015-09-24 | Discharge: 2015-09-24 | Disposition: A | Discharge status: Home / Self Care | Payer: Federal, State, Local not specified - PPO | Attending: Emergency Medicine | Admitting: Emergency Medicine

## 2015-09-24 ENCOUNTER — Encounter (HOSPITAL_COMMUNITY): Payer: Self-pay | Admitting: *Deleted

## 2015-09-24 ENCOUNTER — Ambulatory Visit: Payer: Federal, State, Local not specified - PPO

## 2015-09-24 VITALS — BP 160/96

## 2015-09-24 DIAGNOSIS — Z79899 Other long term (current) drug therapy: Secondary | ICD-10-CM | POA: Insufficient documentation

## 2015-09-24 DIAGNOSIS — E669 Obesity, unspecified: Secondary | ICD-10-CM | POA: Insufficient documentation

## 2015-09-24 DIAGNOSIS — R0602 Shortness of breath: Secondary | ICD-10-CM | POA: Diagnosis not present

## 2015-09-24 DIAGNOSIS — K219 Gastro-esophageal reflux disease without esophagitis: Secondary | ICD-10-CM | POA: Diagnosis not present

## 2015-09-24 DIAGNOSIS — Z7982 Long term (current) use of aspirin: Secondary | ICD-10-CM | POA: Insufficient documentation

## 2015-09-24 DIAGNOSIS — Z8619 Personal history of other infectious and parasitic diseases: Secondary | ICD-10-CM | POA: Insufficient documentation

## 2015-09-24 DIAGNOSIS — Z8709 Personal history of other diseases of the respiratory system: Secondary | ICD-10-CM | POA: Diagnosis not present

## 2015-09-24 DIAGNOSIS — F419 Anxiety disorder, unspecified: Secondary | ICD-10-CM | POA: Insufficient documentation

## 2015-09-24 DIAGNOSIS — Z87448 Personal history of other diseases of urinary system: Secondary | ICD-10-CM | POA: Insufficient documentation

## 2015-09-24 DIAGNOSIS — R0789 Other chest pain: Secondary | ICD-10-CM | POA: Diagnosis not present

## 2015-09-24 DIAGNOSIS — Z0130 Encounter for examination of blood pressure without abnormal findings: Secondary | ICD-10-CM | POA: Insufficient documentation

## 2015-09-24 DIAGNOSIS — R079 Chest pain, unspecified: Secondary | ICD-10-CM | POA: Diagnosis not present

## 2015-09-24 DIAGNOSIS — I10 Essential (primary) hypertension: Secondary | ICD-10-CM | POA: Diagnosis present

## 2015-09-24 DIAGNOSIS — I159 Secondary hypertension, unspecified: Secondary | ICD-10-CM | POA: Insufficient documentation

## 2015-09-24 LAB — BASIC METABOLIC PANEL
ANION GAP: 12 (ref 5–15)
BUN: 14 mg/dL (ref 6–20)
CHLORIDE: 106 mmol/L (ref 101–111)
CO2: 22 mmol/L (ref 22–32)
Calcium: 9.2 mg/dL (ref 8.9–10.3)
Creatinine, Ser: 0.77 mg/dL (ref 0.44–1.00)
Glucose, Bld: 93 mg/dL (ref 65–99)
POTASSIUM: 3.8 mmol/L (ref 3.5–5.1)
SODIUM: 140 mmol/L (ref 135–145)

## 2015-09-24 LAB — CBC
HEMATOCRIT: 42.6 % (ref 36.0–46.0)
HEMOGLOBIN: 14.4 g/dL (ref 12.0–15.0)
MCH: 31.8 pg (ref 26.0–34.0)
MCHC: 33.8 g/dL (ref 30.0–36.0)
MCV: 94 fL (ref 78.0–100.0)
Platelets: 319 10*3/uL (ref 150–400)
RBC: 4.53 MIL/uL (ref 3.87–5.11)
RDW: 12.5 % (ref 11.5–15.5)
WBC: 9.4 10*3/uL (ref 4.0–10.5)

## 2015-09-24 LAB — I-STAT TROPONIN, ED
TROPONIN I, POC: 0 ng/mL (ref 0.00–0.08)
TROPONIN I, POC: 0 ng/mL (ref 0.00–0.08)

## 2015-09-24 MED ORDER — CLONIDINE HCL 0.2 MG PO TABS
0.3000 mg | ORAL_TABLET | Freq: Once | ORAL | Status: AC
  Administered 2015-09-24: 0.3 mg via ORAL
  Filled 2015-09-24: qty 1

## 2015-09-24 NOTE — ED Notes (Signed)
MD aware of patients BP after Medication given.

## 2015-09-24 NOTE — Telephone Encounter (Signed)
Patient came in for BP check, reading was 160/96. Patient also stated that she had tightness in her chest, dizziness, tingling in her left arm. Spoke to [facility] NP advised patient to ED now. Patient stated that she is going to [place].

## 2015-09-24 NOTE — Progress Notes (Signed)
Pre visit review using our clinic review tool, if applicable. No additional management support is needed unless otherwise documented below in the visit note. 

## 2015-09-24 NOTE — Discharge Instructions (Signed)

## 2015-09-24 NOTE — Telephone Encounter (Signed)
Please advise 

## 2015-09-24 NOTE — Telephone Encounter (Signed)
[facility] - Client [place] [facility] Patient Name: AYN CARRITHERS DOB: [birth year] Initial Comment Caller States BP 189/110, hard to concentrate, gets confused easy, not sure if those symptoms are part of the BP or not. Nurse Assessment Nurse: Dimas Chyle, RN, Dellis Filbert Date/Time Eilene Ghazi Time): 09/24/2015 10:38:24 AM Confirm and document reason for call. If symptomatic, describe symptoms. You must click the next button to save text entered. ---Caller States BP 189/110, hard to concentrate, gets confused easy, not sure if those symptoms are part of the BP or not. Has the patient traveled out of the country within the last 30 days? ---No Does the patient have any new or worsening symptoms? ---Yes Will a triage be completed? ---Yes Related visit to physician within the last 2 weeks? ---No Does the PT have any chronic conditions? (i.e. diabetes, asthma, etc.) ---Yes List chronic conditions. ---HTN Is the patient pregnant or possibly pregnant? (Ask all females between the ages of 54-55) ---No Is this a behavioral health or substance abuse call? ---No Guidelines Guideline Title Affirmed Question Affirmed Notes High Blood Pressure BP # 180/110 Final Disposition User See Physician within 24 Hours Dimas Chyle, RN, [employer] Referrals REFERRED TO PCP OFFICE REFERRED TO PCP OFFICE Disagree/Comply: Comply

## 2015-09-24 NOTE — Telephone Encounter (Signed)
Emailed Rx for Benicar HCT 1 po qd #30 11 ref NAS diet ROV next week Thx

## 2015-09-24 NOTE — ED Provider Notes (Signed)
CSN: [id number]     Arrival date & time 09/24/15  1358 History   First MD Initiated Contact with Patient 09/24/15 1632     Chief Complaint  Patient presents with  . Chest Pain  . Hypertension      HPI Patient presents with high blood pressure the last 2-3 days.  Had some chest pressure and left arm tingling for off and on for the last week.  Patient has history of hypertension and takes medication.  No changes in medicine.  Denies diaphoresis nausea or vomiting. Past Medical History  Diagnosis Date  . Chronic bronchitis ([place])   . Hypertension   . Hormone replacement therapy (HRT)   . Abdominal pain 11/25/2013    Has some nausea associated with pain that has been on and off x 2 weeks will get [place]  . Gallstones   . Night sweats 04/14/2014  . Yeast infection 05/07/2014  . Obesity 05/07/2014  . Anxiety     Takes Lorazepam  . GERD (gastroesophageal reflux disease)   . Urge incontinence 08/19/2015   Past Surgical History  Procedure Laterality Date  . Wisdom tooth extraction    . Mole removal    . Cholecystectomy    . Foot surgery Left   . Colonoscopy    . Anterior fusion cervical spine  06/05/2015    C 5 6 7    . Anterior cervical decomp/discectomy fusion N/A 06/05/2015    Procedure: C5-6, C6-7 Anterior Cervical Discectomy and Fusion, Allograft, Plate;  Surgeon: [name], MD;  Location: [facility];  Service: Orthopedics;  Laterality: N/A;   Family History  Problem Relation Age of Onset  . Breast cancer Mother     breast  . Diabetes Mother   . Fibromyalgia Mother   . Heart disease Mother     CHF  . Hypertension Mother   . Kidney disease Mother   . Heart disease Father   . Emphysema Father   . Diabetes Brother   . Hypertension Brother     x 2  . Heart disease Maternal Grandmother   . Diabetes Maternal Grandmother   . Asthma Maternal Grandmother   . Arthritis Maternal Grandmother   . Heart disease Maternal Grandfather   . Arthritis Maternal Grandfather   . Cancer Maternal  Grandfather     bladder  . Breast cancer Paternal Grandmother   . Heart disease Paternal Grandmother   . Arthritis Paternal Grandmother   . Stroke Paternal Grandmother   . Diabetes Paternal Grandfather   . Heart disease Paternal Grandfather   . Colon cancer Paternal Grandfather     mets  . Arthritis Paternal Grandfather   . Stomach cancer Paternal Grandfather     mets to stomach  . Esophageal cancer Neg Hx   . Pancreatic cancer Neg Hx   . Rectal cancer Neg Hx   . Hypertension Brother    Social History  Substance Use Topics  . Smoking status: Never Smoker   . Smokeless tobacco: Never Used  . Alcohol Use: 0.0 oz/week    0 Standard drinks or equivalent per week     Comment: rarely   OB History    Gravida Para Term Preterm AB TAB SAB Ectopic Multiple Living   2 2        2      Review of Systems  Respiratory: Positive for chest tightness.   Cardiovascular: Negative for leg swelling.  All other systems reviewed and are negative.      Allergies  Bee venom; Biaxin; Betadine; Chloraprep one step; Duraprep; and Keflex  Home Medications   Prior to Admission medications   Medication Sig Start Date End Date Taking? Authorizing Provider  amLODipine (NORVASC) 5 MG tablet Take 1 tablet (5 mg total) by mouth daily. 04/01/15  Yes Aleksei Plotnikov V, MD  aspirin 325 MG EC tablet Take 325 mg by mouth daily.   Yes Historical Provider, MD  cholecalciferol (VITAMIN D) 1000 UNITS tablet Take 1 tablet (1,000 Units total) by mouth daily. Patient taking differently: Take 5,000 Units by mouth daily.  02/24/15 02/24/16 Yes Aleksei Plotnikov V, MD  diphenhydrAMINE (BENADRYL) 25 MG tablet Take 2 tablets (50 mg total) by mouth every 4 (four) hours as needed (bee sting). 02/24/15  Yes Aleksei Plotnikov V, MD  estradiol (EVAMIST) 1.53 MG/SPRAY transdermal spray Place 1 spray onto the skin daily. 08/19/15  Yes Estill Dooms, NP  LORazepam (ATIVAN) 0.5 MG tablet take 1 tablet by mouth at bedtime  06/19/15  Yes Florian Buff, MD  metoprolol succinate (TOPROL-XL) 25 MG 24 hr tablet Take 1 tablet (25 mg total) by mouth daily. Take at bedtime 08/25/15  Yes Aleksei Plotnikov V, MD  omeprazole (PRILOSEC) 40 MG capsule Take 1 capsule (40 mg total) by mouth daily. 07/15/15  Yes Aleksei Plotnikov V, MD  progesterone (PROMETRIUM) 200 MG capsule Take 1 capsule (200 mg total) by mouth at bedtime. 08/19/15  Yes Estill Dooms, NP  solifenacin (VESICARE) 10 MG tablet Take 1 tablet (10 mg total) by mouth daily. 08/19/15  Yes Estill Dooms, NP  traMADol (ULTRAM) 50 MG tablet Take 0.5-1 tablets (25-50 mg total) by mouth 2 (two) times daily as needed for severe pain. 09/21/15  Yes Aleksei Plotnikov V, MD  valACYclovir (VALTREX) 1000 MG tablet take 1 tablet by mouth once daily 09/21/15  Yes Estill Dooms, NP  olmesartan-hydrochlorothiazide (BENICAR HCT) 40-12.5 MG tablet Take 1 tablet by mouth daily. 09/25/15   Aleksei Plotnikov V, MD  phentermine (ADIPEX-P) 37.5 MG tablet Take 1 tablet (37.5 mg total) by mouth daily before breakfast. Patient not taking: Reported on 09/24/2015 07/15/15   Aleksei Plotnikov V, MD   BP 104/62 mmHg  Pulse 60  Temp(Src) 98.6 F (37 C) (Oral)  Resp 16  SpO2 96%  LMP 09/22/2015 Physical Exam Physical Exam  Nursing note and vitals reviewed. Constitutional: She is oriented to person, place, and time. She appears well-developed and well-nourished. No distress.  HENT:  Head: Normocephalic and atraumatic.  Eyes: Pupils are equal, round, and reactive to light.  Neck: Normal range of motion.  Cardiovascular: Normal rate and intact distal pulses.   Pulmonary/Chest: No respiratory distress.  Abdominal: Normal appearance. She exhibits no distension.  Musculoskeletal: Normal range of motion.  Neurological: She is alert and oriented to person, place, and time. No cranial nerve deficit.  Skin: Skin is warm and dry. No rash noted.  Psychiatric: She has a normal mood and affect.  Her behavior is normal.   ED Course  Procedures (including critical care time) Medications  cloNIDine (CATAPRES) tablet 0.3 mg (0.3 mg Oral Given 09/24/15 1720)    Labs Review Labs Reviewed  BASIC METABOLIC PANEL  CBC  I-STAT [place], ED  I-STAT [place], ED    Imaging Review Dg Chest 2 View  09/24/2015  CLINICAL DATA:  Left-sided chest pain for 3 days with intermittent shortness of Breath EXAM: CHEST  2 VIEW COMPARISON:  05/27/2015 FINDINGS: The heart size and mediastinal contours are within normal limits. Both  lungs are clear. The visualized skeletal structures are unremarkable. Postsurgical changes are noted in the lower cervical spine. IMPRESSION: No active cardiopulmonary disease. Electronically Signed   By: Inez Catalina M.D.   On: 09/24/2015 14:48   I have personally reviewed and evaluated these images and lab results as part of my medical decision-making.   EKG Interpretation   Date/Time:  Thursday September 24 2015 14:07:50 EDT Ventricular Rate:  78 PR Interval:  158 QRS Duration: 78 QT Interval:  380 QTC Calculation: 433 R Axis:   19 Text Interpretation:  Normal sinus rhythm Normal ECG Confirmed by Chaeli Judy   MD, Halsey Hammen ([id number]) on 09/24/2015 [id number] PM     Systolic blood pressure [id number] at time of discharge. MDM   Final diagnoses:  Secondary hypertension, unspecified        Leonard Schwartz, MD 09/26/15 1113

## 2015-09-24 NOTE — ED Notes (Signed)
Pt reports episodes of HTN, chest pressure and left arm tingling x 1 week. ekg done at triage, no resp distress noted.

## 2015-09-24 NOTE — ED Notes (Signed)
8 oz Ginger Ale given. Rn approved.

## 2015-09-25 ENCOUNTER — Telehealth: Payer: Self-pay

## 2015-09-25 MED ORDER — OLMESARTAN MEDOXOMIL-HCTZ 40-12.5 MG PO TABS: 1.0000 | ORAL_TABLET | Freq: Every day | ORAL | Status: DC

## 2015-09-25 NOTE — Telephone Encounter (Signed)
Pt informed. She states her BP was the highest its been today at 151/103. I advised her to pick up/start Benicar HCT, continue checking/logging BP readings,  keep ROV 10/01/15 and go to ER or UC if she develops new or worsening symptoms.

## 2015-09-25 NOTE — Telephone Encounter (Signed)
Patient went to ER yesterday. Has a FU on 4/27 with Dr. Alain Marion about her medication needs to be changed for her b/p. If needs to be sooner please advise.

## 2015-09-25 NOTE — Telephone Encounter (Signed)
Start Benicar HCT OV next week Thx

## 2015-09-25 NOTE — Telephone Encounter (Signed)
Pt informed-  Is Benicar HCT in addition to her amlodipine and metoprolol OR to replace them? Please advise.

## 2015-09-25 NOTE — Telephone Encounter (Signed)
In addition to the other two Thx

## 2015-10-01 ENCOUNTER — Ambulatory Visit (INDEPENDENT_AMBULATORY_CARE_PROVIDER_SITE_OTHER): Payer: Federal, State, Local not specified - PPO | Admitting: Internal Medicine

## 2015-10-01 ENCOUNTER — Encounter: Payer: Self-pay | Admitting: Internal Medicine

## 2015-10-01 VITALS — BP 150/94 | HR 91 | Wt 214.0 lb

## 2015-10-01 DIAGNOSIS — I10 Essential (primary) hypertension: Secondary | ICD-10-CM

## 2015-10-01 DIAGNOSIS — Z981 Arthrodesis status: Secondary | ICD-10-CM | POA: Diagnosis not present

## 2015-10-01 DIAGNOSIS — M5440 Lumbago with sciatica, unspecified side: Secondary | ICD-10-CM

## 2015-10-01 MED ORDER — METOPROLOL SUCCINATE ER 50 MG PO TB24: 50.0000 mg | ORAL_TABLET | Freq: Every evening | ORAL | Status: DC

## 2015-10-01 MED ORDER — OMEPRAZOLE 40 MG PO CPDR: 40.0000 mg | DELAYED_RELEASE_CAPSULE | Freq: Two times a day (BID) | ORAL | Status: DC

## 2015-10-01 NOTE — Progress Notes (Signed)
Pre visit review using our clinic review tool, if applicable. No additional management support is needed unless otherwise documented below in the visit note. 

## 2015-10-01 NOTE — Progress Notes (Signed)
Subjective:  Patient ID: Caroline Wong, female    DOB: [birth year]  Age: 55 y.o. MRN: [id number]  CC: No chief complaint on file.   HPI Caroline Wong presents for HTN f/u SBP 120-145, DBP 70-95. C/o CP off and on. C/o GERD. CL was nl in 11/16  Outpatient Prescriptions Prior to Visit  Medication Sig Dispense Refill  . amLODipine (NORVASC) 5 MG tablet Take 1 tablet (5 mg total) by mouth daily. 30 tablet 11  . aspirin 325 MG EC tablet Take 325 mg by mouth daily.    . cholecalciferol (VITAMIN D) 1000 UNITS tablet Take 1 tablet (1,000 Units total) by mouth daily. (Patient taking differently: Take 5,000 Units by mouth daily. ) 100 tablet 3  . diphenhydrAMINE (BENADRYL) 25 MG tablet Take 2 tablets (50 mg total) by mouth every 4 (four) hours as needed (bee sting). 30 tablet 3  . estradiol (EVAMIST) 1.53 MG/SPRAY transdermal spray Place 1 spray onto the skin daily. 8.1 mL 12  . LORazepam (ATIVAN) 0.5 MG tablet take 1 tablet by mouth at bedtime 30 tablet 5  . metoprolol succinate (TOPROL-XL) 25 MG 24 hr tablet Take 1 tablet (25 mg total) by mouth daily. Take at bedtime 30 tablet 11  . olmesartan-hydrochlorothiazide (BENICAR HCT) 40-12.5 MG tablet Take 1 tablet by mouth daily. 30 tablet 11  . omeprazole (PRILOSEC) 40 MG capsule Take 1 capsule (40 mg total) by mouth daily. 30 capsule 11  . phentermine (ADIPEX-P) 37.5 MG tablet Take 1 tablet (37.5 mg total) by mouth daily before breakfast. 30 tablet 2  . progesterone (PROMETRIUM) 200 MG capsule Take 1 capsule (200 mg total) by mouth at bedtime. 30 capsule 12  . traMADol (ULTRAM) 50 MG tablet Take 0.5-1 tablets (25-50 mg total) by mouth 2 (two) times daily as needed for severe pain. 60 tablet 2  . valACYclovir (VALTREX) 1000 MG tablet take 1 tablet by mouth once daily 30 tablet PRN  . solifenacin (VESICARE) 10 MG tablet Take 1 tablet (10 mg total) by mouth daily. (Patient not taking: Reported on 10/01/2015) 30 tablet 6   Facility-Administered  Medications Prior to Visit  Medication Dose Route Frequency Provider Last Rate Last Dose  . aminophylline injection 75 mg  75 mg Intravenous BID PRN Dorothy Spark, MD   75 mg at 04/13/15 1037    ROS Review of Systems  Constitutional: Negative for chills, activity change, appetite change, fatigue and unexpected weight change.  HENT: Negative for congestion, mouth sores and sinus pressure.   Eyes: Negative for visual disturbance.  Respiratory: Negative for cough and chest tightness.   Gastrointestinal: Negative for nausea and abdominal pain.  Genitourinary: Negative for frequency, difficulty urinating and vaginal pain.  Musculoskeletal: Negative for back pain and gait problem.  Skin: Negative for pallor and rash.  Neurological: Negative for dizziness, tremors, weakness, numbness and headaches.  Psychiatric/Behavioral: Negative for confusion and sleep disturbance.    Objective:  BP 150/94 mmHg  Pulse 91  Wt 214 lb (97.07 kg)  SpO2 97%  LMP 09/22/2015  BP Readings from Last 3 Encounters:  10/01/15 150/94  09/24/15 104/62  09/24/15 160/96    Wt Readings from Last 3 Encounters:  10/01/15 214 lb (97.07 kg)  08/19/15 213 lb 8 oz (96.843 kg)  07/15/15 215 lb (97.523 kg)    Physical Exam  Constitutional: She appears well-developed. No distress.  HENT:  Head: Normocephalic.  Right Ear: External ear normal.  Left Ear: External ear normal.  Nose: Nose  normal.  Mouth/Throat: Oropharynx is clear and moist.  Eyes: Conjunctivae are normal. Pupils are equal, round, and reactive to light. Right eye exhibits no discharge. Left eye exhibits no discharge.  Neck: Normal range of motion. Neck supple. No JVD present. No tracheal deviation present. No thyromegaly present.  Cardiovascular: Normal rate, regular rhythm and normal heart sounds.   Pulmonary/Chest: No stridor. No respiratory distress. She has no wheezes.  Abdominal: Soft. Bowel sounds are normal. She exhibits no distension and  no mass. There is no tenderness. There is no rebound and no guarding.  Musculoskeletal: She exhibits no edema or tenderness.  Lymphadenopathy:    She has no cervical adenopathy.  Neurological: She displays normal reflexes. No cranial nerve deficit. She exhibits normal muscle tone. Coordination normal.  Skin: No rash noted. No erythema.  Psychiatric: She has a normal mood and affect. Her behavior is normal. Judgment and thought content normal.    Lab Results  Component Value Date   WBC 9.4 09/24/2015   HGB 14.4 09/24/2015   HCT 42.6 09/24/2015   PLT 319 09/24/2015   GLUCOSE 93 09/24/2015   CHOL 183 10/30/2013   TRIG 93.0 10/30/2013   HDL 57.10 10/30/2013   LDLCALC 107* 10/30/2013   ALT 20 05/27/2015   AST 20 05/27/2015   NA 140 09/24/2015   K 3.8 09/24/2015   CL 106 09/24/2015   CREATININE 0.77 09/24/2015   BUN 14 09/24/2015   CO2 22 09/24/2015   TSH 0.60 05/13/2015   INR 1.02 05/27/2015    Dg Chest 2 View  09/24/2015  CLINICAL DATA:  Left-sided chest pain for 3 days with intermittent shortness of Breath EXAM: CHEST  2 VIEW COMPARISON:  05/27/2015 FINDINGS: The heart size and mediastinal contours are within normal limits. Both lungs are clear. The visualized skeletal structures are unremarkable. Postsurgical changes are noted in the lower cervical spine. IMPRESSION: No active cardiopulmonary disease. Electronically Signed   By: Inez Catalina M.D.   On: 09/24/2015 14:48    Assessment & Plan:   There are no diagnoses linked to this encounter. I am having Ms. Caroline Wong maintain her diphenhydrAMINE, cholecalciferol, amLODipine, LORazepam, aspirin, phentermine, omeprazole, estradiol, progesterone, solifenacin, metoprolol succinate, traMADol, valACYclovir, and olmesartan-hydrochlorothiazide.  No orders of the defined types were placed in this encounter.     Follow-up: No Follow-up on file.  Walker Kehr, MD

## 2015-10-04 ENCOUNTER — Encounter: Payer: Self-pay | Admitting: Internal Medicine

## 2015-10-04 NOTE — Assessment & Plan Note (Signed)
On tramadol/flexeril/naproxen prn.

## 2015-10-04 NOTE — Assessment & Plan Note (Signed)
Hyzaar 100-12.5 Amlodipine 5 mg/d Toprol XL 1/2 tab qd 12.5 mg

## 2015-10-09 ENCOUNTER — Encounter: Payer: Self-pay | Admitting: Internal Medicine

## 2015-10-13 ENCOUNTER — Encounter: Payer: Self-pay | Admitting: Internal Medicine

## 2015-10-14 MED ORDER — OMEPRAZOLE 40 MG PO CPDR: 40.0000 mg | DELAYED_RELEASE_CAPSULE | Freq: Two times a day (BID) | ORAL | Status: DC

## 2015-10-14 MED ORDER — METOPROLOL SUCCINATE ER 50 MG PO TB24: 50.0000 mg | ORAL_TABLET | Freq: Every evening | ORAL | Status: DC

## 2015-10-22 ENCOUNTER — Encounter: Payer: Self-pay | Admitting: Internal Medicine

## 2015-10-23 ENCOUNTER — Encounter: Payer: Self-pay | Admitting: Internal Medicine

## 2015-10-23 ENCOUNTER — Ambulatory Visit (INDEPENDENT_AMBULATORY_CARE_PROVIDER_SITE_OTHER): Payer: Federal, State, Local not specified - PPO | Admitting: Internal Medicine

## 2015-10-23 ENCOUNTER — Telehealth: Payer: Self-pay | Admitting: Internal Medicine

## 2015-10-23 ENCOUNTER — Other Ambulatory Visit (INDEPENDENT_AMBULATORY_CARE_PROVIDER_SITE_OTHER): Payer: Federal, State, Local not specified - PPO

## 2015-10-23 VITALS — BP 140/90 | HR 76 | Wt 215.0 lb

## 2015-10-23 DIAGNOSIS — K219 Gastro-esophageal reflux disease without esophagitis: Secondary | ICD-10-CM

## 2015-10-23 DIAGNOSIS — K2100 Gastro-esophageal reflux disease with esophagitis, without bleeding: Secondary | ICD-10-CM

## 2015-10-23 DIAGNOSIS — R0789 Other chest pain: Secondary | ICD-10-CM | POA: Diagnosis not present

## 2015-10-23 LAB — CBC WITH DIFFERENTIAL/PLATELET
BASOS ABS: 0.1 10*3/uL (ref 0.0–0.1)
Basophils Relative: 0.5 % (ref 0.0–3.0)
EOS PCT: 2.4 % (ref 0.0–5.0)
Eosinophils Absolute: 0.2 10*3/uL (ref 0.0–0.7)
HEMATOCRIT: 43.1 % (ref 36.0–46.0)
HEMOGLOBIN: 14.6 g/dL (ref 12.0–15.0)
LYMPHS ABS: 3.2 10*3/uL (ref 0.7–4.0)
Lymphocytes Relative: 31.3 % (ref 12.0–46.0)
MCHC: 33.8 g/dL (ref 30.0–36.0)
MCV: 93.7 fl (ref 78.0–100.0)
MONOS PCT: 6.9 % (ref 3.0–12.0)
Monocytes Absolute: 0.7 10*3/uL (ref 0.1–1.0)
Neutro Abs: 6 10*3/uL (ref 1.4–7.7)
Neutrophils Relative %: 58.9 % (ref 43.0–77.0)
Platelets: 322 10*3/uL (ref 150.0–400.0)
RBC: 4.6 Mil/uL (ref 3.87–5.11)
RDW: 12.8 % (ref 11.5–15.5)
WBC: 10.2 10*3/uL (ref 4.0–10.5)

## 2015-10-23 LAB — BASIC METABOLIC PANEL
BUN: 23 mg/dL (ref 6–23)
CALCIUM: 9.4 mg/dL (ref 8.4–10.5)
CO2: 28 mEq/L (ref 19–32)
Chloride: 104 mEq/L (ref 96–112)
Creatinine, Ser: 0.9 mg/dL (ref 0.40–1.20)
GFR: 69.74 mL/min (ref >60.00)
GLUCOSE: 108 mg/dL — AB (ref 70–99)
POTASSIUM: 4.1 meq/L (ref 3.5–5.1)
SODIUM: 138 meq/L (ref 135–145)

## 2015-10-23 LAB — H. PYLORI ANTIBODY, IGG: H Pylori IgG: NEGATIVE

## 2015-10-23 LAB — HEPATIC FUNCTION PANEL
ALBUMIN: 4.4 g/dL (ref 3.5–5.2)
ALT: 25 U/L (ref 0–35)
AST: 19 U/L (ref 0–37)
Alkaline Phosphatase: 102 U/L (ref 39–117)
BILIRUBIN TOTAL: 0.5 mg/dL (ref 0.2–1.2)
Bilirubin, Direct: 0.1 mg/dL (ref 0.0–0.3)
Total Protein: 7.7 g/dL (ref 6.0–8.3)

## 2015-10-23 LAB — LIPASE: Lipase: 52 U/L (ref 11.0–59.0)

## 2015-10-23 MED ORDER — RABEPRAZOLE SODIUM 20 MG PO TBEC: 20.0000 mg | DELAYED_RELEASE_TABLET | Freq: Two times a day (BID) | ORAL | Status: DC

## 2015-10-23 NOTE — Telephone Encounter (Signed)
Patient made my chart appt for the 24th stating she is still having chest pain.  She would like to come in today to see plot.  Please advise.

## 2015-10-23 NOTE — Telephone Encounter (Signed)
Addressed Thx 

## 2015-10-23 NOTE — Telephone Encounter (Signed)
Dr. Jenny Reichmann had an opening at 4pm today.  Patient did not want to see Dr. Jenny Reichmann.  Patient states she has talked to you previously in regards to chest pain.

## 2015-10-23 NOTE — Assessment & Plan Note (Signed)
D/c Prilosec Aciphex 20 mg bid

## 2015-10-23 NOTE — Progress Notes (Signed)
Subjective:  Patient ID: Caroline Wong, female    DOB: Jan 26, 1961  Age: 55 y.o. MRN: [id number]  CC: Chest Pain   HPI SHARLETTE FERNSTROM presents for CP 2-7/10 pain in the mid-chest - worse after eating since Sept 2016 - getting worse  Outpatient Prescriptions Prior to Visit  Medication Sig Dispense Refill  . amLODipine (NORVASC) 5 MG tablet Take 1 tablet (5 mg total) by mouth daily. 30 tablet 11  . aspirin 325 MG EC tablet Take 325 mg by mouth daily.    . cholecalciferol (VITAMIN D) 1000 UNITS tablet Take 1 tablet (1,000 Units total) by mouth daily. (Patient taking differently: Take 5,000 Units by mouth daily. ) 100 tablet 3  . diphenhydrAMINE (BENADRYL) 25 MG tablet Take 2 tablets (50 mg total) by mouth every 4 (four) hours as needed (bee sting). 30 tablet 3  . estradiol (EVAMIST) 1.53 MG/SPRAY transdermal spray Place 1 spray onto the skin daily. 8.1 mL 12  . LORazepam (ATIVAN) 0.5 MG tablet take 1 tablet by mouth at bedtime 30 tablet 5  . metoprolol succinate (TOPROL XL) 50 MG 24 hr tablet Take 1 tablet (50 mg total) by mouth every evening. Take with or immediately following a meal. 30 tablet 11  . olmesartan-hydrochlorothiazide (BENICAR HCT) 40-12.5 MG tablet Take 1 tablet by mouth daily. 30 tablet 11  . omeprazole (PRILOSEC) 40 MG capsule Take 1 capsule (40 mg total) by mouth 2 (two) times daily. 60 capsule 11  . progesterone (PROMETRIUM) 200 MG capsule Take 1 capsule (200 mg total) by mouth at bedtime. 30 capsule 12  . traMADol (ULTRAM) 50 MG tablet Take 0.5-1 tablets (25-50 mg total) by mouth 2 (two) times daily as needed for severe pain. 60 tablet 2  . valACYclovir (VALTREX) 1000 MG tablet take 1 tablet by mouth once daily 30 tablet PRN  . phentermine (ADIPEX-P) 37.5 MG tablet Take 1 tablet (37.5 mg total) by mouth daily before breakfast. (Patient not taking: Reported on 10/23/2015) 30 tablet 2  . solifenacin (VESICARE) 10 MG tablet Take 1 tablet (10 mg total) by mouth daily.  (Patient not taking: Reported on 10/23/2015) 30 tablet 6   Facility-Administered Medications Prior to Visit  Medication Dose Route Frequency Provider Last Rate Last Dose  . aminophylline injection 75 mg  75 mg Intravenous BID PRN Dorothy Spark, MD   75 mg at 04/13/15 1037    ROS Review of Systems  Constitutional: Negative for chills, activity change, appetite change, fatigue and unexpected weight change.  HENT: Negative for congestion, mouth sores and sinus pressure.   Eyes: Negative for visual disturbance.  Respiratory: Negative for cough and chest tightness.   Cardiovascular: Positive for chest pain.  Gastrointestinal: Negative for nausea and abdominal pain.  Genitourinary: Negative for frequency, difficulty urinating and vaginal pain.  Musculoskeletal: Negative for back pain and gait problem.  Skin: Negative for pallor and rash.  Neurological: Negative for dizziness, tremors, weakness, numbness and headaches.  Psychiatric/Behavioral: Negative for confusion and sleep disturbance.    Objective:  BP 140/90 mmHg  Pulse 76  Wt 215 lb (97.523 kg)  SpO2 98%  LMP 09/22/2015  BP Readings from Last 3 Encounters:  10/23/15 140/90  10/01/15 150/94  09/24/15 104/62    Wt Readings from Last 3 Encounters:  10/23/15 215 lb (97.523 kg)  10/01/15 214 lb (97.07 kg)  08/19/15 213 lb 8 oz (96.843 kg)    Physical Exam  Constitutional: She appears well-developed. No distress.  HENT:  Head:  Normocephalic.  Right Ear: External ear normal.  Left Ear: External ear normal.  Nose: Nose normal.  Mouth/Throat: Oropharynx is clear and moist.  Eyes: Conjunctivae are normal. Pupils are equal, round, and reactive to light. Right eye exhibits no discharge. Left eye exhibits no discharge.  Neck: Normal range of motion. Neck supple. No JVD present. No tracheal deviation present. No thyromegaly present.  Cardiovascular: Normal rate, regular rhythm and normal heart sounds.   Pulmonary/Chest: No  stridor. No respiratory distress. She has no wheezes.  Abdominal: Soft. Bowel sounds are normal. She exhibits no distension and no mass. There is no tenderness. There is no rebound and no guarding.  Musculoskeletal: She exhibits no edema or tenderness.  Lymphadenopathy:    She has no cervical adenopathy.  Neurological: She displays normal reflexes. No cranial nerve deficit. She exhibits normal muscle tone. Coordination normal.  Skin: No rash noted. No erythema.  Psychiatric: She has a normal mood and affect. Her behavior is normal. Judgment and thought content normal.    Lab Results  Component Value Date   WBC 9.4 09/24/2015   HGB 14.4 09/24/2015   HCT 42.6 09/24/2015   PLT 319 09/24/2015   GLUCOSE 93 09/24/2015   CHOL 183 10/30/2013   TRIG 93.0 10/30/2013   HDL 57.10 10/30/2013   LDLCALC 107* 10/30/2013   ALT 20 05/27/2015   AST 20 05/27/2015   NA 140 09/24/2015   K 3.8 09/24/2015   CL 106 09/24/2015   CREATININE 0.77 09/24/2015   BUN 14 09/24/2015   CO2 22 09/24/2015   TSH 0.60 05/13/2015   INR 1.02 05/27/2015   Procedure: EKG Indication: chest pain Impression: NSR. No acute changes.   Dg Chest 2 View  09/24/2015  CLINICAL DATA:  Left-sided chest pain for 3 days with intermittent shortness of Breath EXAM: CHEST  2 VIEW COMPARISON:  05/27/2015 FINDINGS: The heart size and mediastinal contours are within normal limits. Both lungs are clear. The visualized skeletal structures are unremarkable. Postsurgical changes are noted in the lower cervical spine. IMPRESSION: No active cardiopulmonary disease. Electronically Signed   By: Inez Catalina M.D.   On: 09/24/2015 14:48    Assessment & Plan:   Payton was seen today for chest pain.  Diagnoses and all orders for this visit:  Other chest pain -     EKG 12-Lead   I am having Ms. Chrissie Noa maintain her diphenhydrAMINE, cholecalciferol, amLODipine, LORazepam, aspirin, phentermine, estradiol, progesterone, solifenacin, traMADol,  valACYclovir, olmesartan-hydrochlorothiazide, metoprolol succinate, and omeprazole.  No orders of the defined types were placed in this encounter.     Follow-up: No Follow-up on file.  Walker Kehr, MD

## 2015-10-23 NOTE — Progress Notes (Signed)
Pre visit review using our clinic review tool, if applicable. No additional management support is needed unless otherwise documented below in the visit note. 

## 2015-10-24 LAB — HEPATITIS C ANTIBODY: HCV Ab: NEGATIVE

## 2015-10-24 LAB — D-DIMER, QUANTITATIVE (NOT AT ARMC): D DIMER QUANT: 0.37 ug{FEU}/mL (ref 0.00–0.48)

## 2015-10-25 ENCOUNTER — Encounter: Payer: Self-pay | Admitting: Internal Medicine

## 2015-10-26 ENCOUNTER — Encounter: Payer: Self-pay | Admitting: Internal Medicine

## 2015-10-26 NOTE — Telephone Encounter (Signed)
Pt would like to know if there is anything that can be done for the pain?  Can you call her when you get a chance.  She would like to talk to a nurse

## 2015-10-27 ENCOUNTER — Ambulatory Visit: Payer: Federal, State, Local not specified - PPO | Admitting: Internal Medicine

## 2015-10-28 ENCOUNTER — Ambulatory Visit: Payer: Self-pay | Admitting: Internal Medicine

## 2015-10-28 ENCOUNTER — Ambulatory Visit (INDEPENDENT_AMBULATORY_CARE_PROVIDER_SITE_OTHER)
Admission: RE | Admit: 2015-10-28 | Discharge: 2015-10-28 | Disposition: A | Discharge status: Home / Self Care | Payer: Federal, State, Local not specified - PPO | Source: Ambulatory Visit | Attending: Internal Medicine | Admitting: Internal Medicine

## 2015-10-28 DIAGNOSIS — R0789 Other chest pain: Secondary | ICD-10-CM | POA: Diagnosis not present

## 2015-10-28 DIAGNOSIS — R079 Chest pain, unspecified: Secondary | ICD-10-CM | POA: Diagnosis not present

## 2015-10-28 MED ORDER — IOPAMIDOL (ISOVUE-300) INJECTION 61%
80.0000 mL | Freq: Once | INTRAVENOUS | Status: AC | PRN
  Administered 2015-10-28: 80 mL via INTRAVENOUS

## 2015-10-30 NOTE — Telephone Encounter (Signed)
Patient called and asked for you to please check the message from mychart that she sent. Thank you.

## 2015-11-03 ENCOUNTER — Inpatient Hospital Stay: Admission: RE | Admit: 2015-11-03 | Payer: Federal, State, Local not specified - PPO | Source: Ambulatory Visit

## 2015-11-03 ENCOUNTER — Ambulatory Visit: Payer: Federal, State, Local not specified - PPO | Admitting: Internal Medicine

## 2015-11-03 ENCOUNTER — Encounter (HOSPITAL_COMMUNITY): Payer: Self-pay | Admitting: Emergency Medicine

## 2015-11-03 ENCOUNTER — Emergency Department (HOSPITAL_COMMUNITY): Payer: Federal, State, Local not specified - PPO

## 2015-11-03 ENCOUNTER — Emergency Department (HOSPITAL_COMMUNITY)
Admission: EM | Admit: 2015-11-03 | Discharge: 2015-11-04 | Disposition: A | Discharge status: Home / Self Care | Payer: Federal, State, Local not specified - PPO | Attending: Emergency Medicine | Admitting: Emergency Medicine

## 2015-11-03 DIAGNOSIS — Z79899 Other long term (current) drug therapy: Secondary | ICD-10-CM | POA: Diagnosis not present

## 2015-11-03 DIAGNOSIS — I10 Essential (primary) hypertension: Secondary | ICD-10-CM | POA: Insufficient documentation

## 2015-11-03 DIAGNOSIS — K529 Noninfective gastroenteritis and colitis, unspecified: Secondary | ICD-10-CM | POA: Diagnosis not present

## 2015-11-03 DIAGNOSIS — N12 Tubulo-interstitial nephritis, not specified as acute or chronic: Secondary | ICD-10-CM | POA: Diagnosis not present

## 2015-11-03 DIAGNOSIS — R6883 Chills (without fever): Secondary | ICD-10-CM | POA: Diagnosis not present

## 2015-11-03 DIAGNOSIS — R197 Diarrhea, unspecified: Secondary | ICD-10-CM | POA: Diagnosis present

## 2015-11-03 LAB — CBC
HEMATOCRIT: 39 % (ref 36.0–46.0)
Hemoglobin: 13.1 g/dL (ref 12.0–15.0)
MCH: 31.9 pg (ref 26.0–34.0)
MCHC: 33.6 g/dL (ref 30.0–36.0)
MCV: 94.9 fL (ref 78.0–100.0)
Platelets: 235 10*3/uL (ref 150–400)
RBC: 4.11 MIL/uL (ref 3.87–5.11)
RDW: 12.4 % (ref 11.5–15.5)
WBC: 16.1 10*3/uL — ABNORMAL HIGH (ref 4.0–10.5)

## 2015-11-03 LAB — COMPREHENSIVE METABOLIC PANEL
ALBUMIN: 3.4 g/dL — AB (ref 3.5–5.0)
ALT: 26 U/L (ref 14–54)
ANION GAP: 11 (ref 5–15)
AST: 19 U/L (ref 15–41)
Alkaline Phosphatase: 81 U/L (ref 38–126)
BUN: 17 mg/dL (ref 6–20)
CALCIUM: 8.3 mg/dL — AB (ref 8.9–10.3)
CO2: 23 mmol/L (ref 22–32)
Chloride: 99 mmol/L — ABNORMAL LOW (ref 101–111)
Creatinine, Ser: 0.95 mg/dL (ref 0.44–1.00)
GFR calc Af Amer: >60 mL/min (ref >60)
GFR calc non Af Amer: >60 mL/min (ref >60)
GLUCOSE: 100 mg/dL — AB (ref 65–99)
POTASSIUM: 2.7 mmol/L — AB (ref 3.5–5.1)
SODIUM: 133 mmol/L — AB (ref 135–145)
TOTAL PROTEIN: 6.9 g/dL (ref 6.5–8.1)
Total Bilirubin: 0.6 mg/dL (ref 0.3–1.2)

## 2015-11-03 LAB — URINALYSIS, ROUTINE W REFLEX MICROSCOPIC
BILIRUBIN URINE: NEGATIVE
Glucose, UA: NEGATIVE mg/dL
Ketones, ur: NEGATIVE mg/dL
NITRITE: NEGATIVE
PH: 6 (ref 5.0–8.0)
Protein, ur: NEGATIVE mg/dL

## 2015-11-03 LAB — URINE MICROSCOPIC-ADD ON

## 2015-11-03 LAB — LIPASE, BLOOD: Lipase: 30 U/L (ref 11–51)

## 2015-11-03 MED ORDER — SODIUM CHLORIDE 0.9 % IV BOLUS (SEPSIS)
1000.0000 mL | Freq: Once | INTRAVENOUS | Status: AC
  Administered 2015-11-03: 1000 mL via INTRAVENOUS

## 2015-11-03 MED ORDER — CIPROFLOXACIN IN D5W 400 MG/200ML IV SOLN
400.0000 mg | Freq: Once | INTRAVENOUS | Status: AC
  Administered 2015-11-03: 400 mg via INTRAVENOUS
  Filled 2015-11-03: qty 200

## 2015-11-03 MED ORDER — POTASSIUM CHLORIDE 10 MEQ/100ML IV SOLN
10.0000 meq | Freq: Once | INTRAVENOUS | Status: AC
  Administered 2015-11-03: 10 meq via INTRAVENOUS
  Filled 2015-11-03: qty 100

## 2015-11-03 MED ORDER — MORPHINE SULFATE (PF) 4 MG/ML IV SOLN
6.0000 mg | Freq: Once | INTRAVENOUS | Status: AC
  Administered 2015-11-03: 6 mg via INTRAVENOUS
  Filled 2015-11-03: qty 2

## 2015-11-03 MED ORDER — ONDANSETRON HCL 4 MG/2ML IJ SOLN
4.0000 mg | Freq: Once | INTRAMUSCULAR | Status: AC
  Administered 2015-11-03: 4 mg via INTRAVENOUS
  Filled 2015-11-03: qty 2

## 2015-11-03 MED ORDER — POTASSIUM CHLORIDE CRYS ER 20 MEQ PO TBCR
40.0000 meq | EXTENDED_RELEASE_TABLET | Freq: Once | ORAL | Status: AC
  Administered 2015-11-03: 40 meq via ORAL
  Filled 2015-11-03: qty 2

## 2015-11-03 NOTE — ED Notes (Signed)
CRITICAL VALUE ALERT  Critical value received:  K+2.7  Date of notification:  11/03/15  Time of notification:  2221  Critical value read back: Yes  Nurse who received alert:  Josiah Lobo  MD notified (1st page):  [place]  Time of first page:  2225  MD notified (2nd page):   Time of second page:  Responding MD:  Mesner  Time MD responded:  2225

## 2015-11-03 NOTE — ED Notes (Signed)
Patient states that she had 5 loose stools in ED waiting room. Patient has had 1 loose stool since being roomed

## 2015-11-03 NOTE — ED Provider Notes (Signed)
CSN: [id number]     Arrival date & time 11/03/15  1616 History  By signing my name below, I, Nicole Kindred, attest that this documentation has been prepared under the direction and in the presence of No att. providers found.   Electronically Signed: Nicole Kindred, ED Scribe. 11/05/2015. 5:08 PM   Chief Complaint  Patient presents with  . Back Pain  . Diarrhea   The history is provided by the patient. No language interpreter was used.   HPI Comments: Caroline Wong is a 55 y.o. female who presents to the Emergency Department complaining of gradual onset, intermittent, back pain, ongoing for four days. Pt reports associated chills, hot/cold flashes, generalized body aches, and mild nausea. Pt also complains of diarrhea beginning today but states she took a laxative pill yesterday. No other associated symptoms noted. No worsening or alleviating factors noted. Pt denies hx of kidney infections, vomiting, abdominal pain, urinary frequency, urinary urgency, difficulty urinating, dysuria, or any other pertinent symptoms.   Past Medical History  Diagnosis Date  . Chronic bronchitis ([place])   . Hypertension   . Hormone replacement therapy (HRT)   . Abdominal pain 11/25/2013    Has some nausea associated with pain that has been on and off x 2 weeks will get [place]  . Gallstones   . Night sweats 04/14/2014  . Yeast infection 05/07/2014  . Obesity 05/07/2014  . Anxiety     Takes Lorazepam  . GERD (gastroesophageal reflux disease)   . Urge incontinence 08/19/2015   Past Surgical History  Procedure Laterality Date  . Wisdom tooth extraction    . Mole removal    . Cholecystectomy    . Foot surgery Left   . Colonoscopy    . Anterior fusion cervical spine  06/05/2015    C 5 6 7    . Anterior cervical decomp/discectomy fusion N/A 06/05/2015    Procedure: C5-6, C6-7 Anterior Cervical Discectomy and Fusion, Allograft, Plate;  Surgeon: [name], MD;  Location: [facility];  Service: Orthopedics;   Laterality: N/A;   Family History  Problem Relation Age of Onset  . Breast cancer Mother     breast  . Diabetes Mother   . Fibromyalgia Mother   . Heart disease Mother     CHF  . Hypertension Mother   . Kidney disease Mother   . Heart disease Father   . Emphysema Father   . Diabetes Brother   . Hypertension Brother     x 2  . Heart disease Maternal Grandmother   . Diabetes Maternal Grandmother   . Asthma Maternal Grandmother   . Arthritis Maternal Grandmother   . Heart disease Maternal Grandfather   . Arthritis Maternal Grandfather   . Cancer Maternal Grandfather     bladder  . Breast cancer Paternal Grandmother   . Heart disease Paternal Grandmother   . Arthritis Paternal Grandmother   . Stroke Paternal Grandmother   . Diabetes Paternal Grandfather   . Heart disease Paternal Grandfather   . Colon cancer Paternal Grandfather     mets  . Arthritis Paternal Grandfather   . Stomach cancer Paternal Grandfather     mets to stomach  . Esophageal cancer Neg Hx   . Pancreatic cancer Neg Hx   . Rectal cancer Neg Hx   . Hypertension Brother    Social History  Substance Use Topics  . Smoking status: Never Smoker   . Smokeless tobacco: Never Used  . Alcohol Use: 0.0 oz/week  0 Standard drinks or equivalent per week     Comment: rarely   OB History    Gravida Para Term Preterm AB TAB SAB Ectopic Multiple Living   2 2        2      Review of Systems  Constitutional: Positive for chills.       Hot and cold flashes.   Gastrointestinal: Positive for nausea. Negative for vomiting and abdominal pain.  Genitourinary: Negative for dysuria, urgency, frequency and difficulty urinating.  Musculoskeletal: Positive for myalgias and back pain.  All other systems reviewed and are negative.   Allergies  Bee venom; Biaxin; Betadine; Chloraprep one step; Duraprep; and Keflex  Home Medications   Prior to Admission medications   Medication Sig Start Date End Date Taking?  Authorizing Provider  amLODipine (NORVASC) 5 MG tablet Take 1 tablet (5 mg total) by mouth daily. 04/01/15  Yes Aleksei Plotnikov V, MD  cholecalciferol (VITAMIN D) 1000 UNITS tablet Take 1 tablet (1,000 Units total) by mouth daily. Patient taking differently: Take 5,000 Units by mouth daily.  02/24/15 02/24/16 Yes Aleksei Plotnikov V, MD  diphenhydrAMINE (BENADRYL) 25 MG tablet Take 2 tablets (50 mg total) by mouth every 4 (four) hours as needed (bee sting). 02/24/15  Yes Aleksei Plotnikov V, MD  estradiol (EVAMIST) 1.53 MG/SPRAY transdermal spray Place 1 spray onto the skin daily. 08/19/15  Yes Estill Dooms, NP  LORazepam (ATIVAN) 0.5 MG tablet take 1 tablet by mouth at bedtime 06/19/15  Yes Florian Buff, MD  metoprolol succinate (TOPROL XL) 50 MG 24 hr tablet Take 1 tablet (50 mg total) by mouth every evening. Take with or immediately following a meal. Patient taking differently: Take 25 mg by mouth every evening. Take with or immediately following a meal. 10/14/15  Yes Aleksei Plotnikov V, MD  olmesartan-hydrochlorothiazide (BENICAR HCT) 40-12.5 MG tablet Take 1 tablet by mouth daily. 09/25/15  Yes Aleksei Plotnikov V, MD  Omega-3 Fatty Acids (FISH OIL PO) Take 1 capsule by mouth daily.   Yes Historical Provider, MD  omeprazole (PRILOSEC) 40 MG capsule Take 40 mg by mouth 2 (two) times daily. 10/14/15  Yes Historical Provider, MD  progesterone (PROMETRIUM) 200 MG capsule Take 1 capsule (200 mg total) by mouth at bedtime. 08/19/15  Yes Estill Dooms, NP  traMADol (ULTRAM) 50 MG tablet Take 0.5-1 tablets (25-50 mg total) by mouth 2 (two) times daily as needed for severe pain. 09/21/15  Yes Aleksei Plotnikov V, MD  valACYclovir (VALTREX) 1000 MG tablet take 1 tablet by mouth once daily 09/21/15  Yes Estill Dooms, NP  amoxicillin-clavulanate (AUGMENTIN) 875-125 MG tablet Take 1 tablet by mouth 2 (two) times daily. One po bid x 7 days 11/04/15   Merrily Pew, MD  ondansetron [facility]) 4 mg TABS  tablet Take 4 tablets by mouth every 8 (eight) hours as needed. 11/04/15   Merrily Pew, MD  ondansetron (ZOFRAN) 4 mg TABS tablet Take 4 tablets by mouth every 8 (eight) hours as needed. 11/04/15   Merrily Pew, MD  potassium chloride SA (K-DUR,KLOR-CON) 20 MEQ tablet Take 2 tablets (40 mEq total) by mouth 2 (two) times daily. 11/04/15   Merrily Pew, MD   BP 101/67 mmHg  Pulse 101  Temp(Src) 98.6 F (37 C) (Oral)  Resp 20  Ht 5\' 7"  (1.702 m)  Wt 212 lb (96.163 kg)  BMI 33.20 kg/m2  SpO2 99%  LMP 10/17/2015 Physical Exam  Constitutional: She appears well-developed and well-nourished. No distress.  HENT:  Head: Normocephalic and atraumatic.  Eyes: Conjunctivae and EOM are normal.  Neck: Neck supple. No tracheal deviation present.  Cardiovascular: Normal rate, regular rhythm and normal heart sounds.  Exam reveals no gallop.   No murmur heard. Pulmonary/Chest: Effort normal and breath sounds normal. No respiratory distress. She has no wheezes. She has no rales.  Abdominal: Soft. She exhibits no distension and no mass. There is no tenderness. There is CVA tenderness. There is no rebound and no guarding.  Bilateral CVA TTP.   Musculoskeletal: Normal range of motion. She exhibits no tenderness.  No midline spinal TTP.   Neurological: She is alert.  Skin: Skin is warm and dry. No rash noted.  Psychiatric: She has a normal mood and affect. Her behavior is normal.    ED Course  Procedures (including critical care time) DIAGNOSTIC STUDIES: Oxygen Saturation is 99% on RA, normal by my interpretation.    COORDINATION OF CARE: 8:44 PM Discussed treatment plan with pt at bedside and pt agreed to plan.  Labs Review Labs Reviewed  COMPREHENSIVE METABOLIC PANEL - Abnormal; Notable for the following:    Sodium 133 (*)    Potassium 2.7 (*)    Chloride 99 (*)    Glucose, Bld 100 (*)    Calcium 8.3 (*)    Albumin 3.4 (*)    All other components within normal limits  CBC - Abnormal; Notable  for the following:    WBC 16.1 (*)    All other components within normal limits  URINALYSIS, ROUTINE W REFLEX MICROSCOPIC (NOT AT [facility]) - Abnormal; Notable for the following:    APPearance HAZY (*)    Specific Gravity, Urine <1.005 (*)    Hgb urine dipstick TRACE (*)    Leukocytes, UA SMALL (*)    All other components within normal limits  URINE MICROSCOPIC-ADD ON - Abnormal; Notable for the following:    Squamous Epithelial / LPF 6-30 (*)    Bacteria, UA MANY (*)    All other components within normal limits  LIPASE, BLOOD    Imaging Review Ct Renal Stone Study  11/03/2015  CLINICAL DATA:  Progressive back pain for 4 days. Chills and body aches, nausea. Clinical concern for renal stone or pyelonephritis. EXAM: CT ABDOMEN AND PELVIS WITHOUT CONTRAST TECHNIQUE: Multidetector CT imaging of the abdomen and pelvis was performed following the standard protocol without IV contrast. COMPARISON:  None. FINDINGS: Lower chest:  Subsegmental atelectasis in the lung bases. Liver: Diffusely decreased density consistent with steatosis. No evidence for focal lesion allowing for lack contrast. Hepatobiliary: Post cholecystectomy.  No biliary dilatation. Pancreas: No ductal dilatation or inflammation. Spleen: Normal. Adrenal glands: No nodule. Kidneys: Symmetric in size without stones or hydronephrosis. There is no perinephric stranding. Both ureters are decompressed without stones along the course. Stomach/Bowel: Stomach is decompressed. Bowel evaluation is limited secondary to lack enteric contrast. No definite dilated or thickened small bowel. Liquid and solid stool throughout the colon. Probable wall thickening involving the splenic flexure of the colon. The appendix is normal. Vascular/Lymphatic: Mild central mesenteric edema in the upper mesenteric. Multiple prominent mesenteric lymph nodes. For example right mid abdomen mesenteric node measures 1.3 cm, series 2, image 50. No retroperitoneal adenopathy.  Abdominal aorta is normal in caliber. Reproductive: Uterus is normal. Ovaries not confidently identified. No adnexal mass. Bladder: Physiologically distended. No wall thickening or bladder stone. Other: No free air, free fluid, or intra-abdominal fluid collection. Tiny fat containing umbilical hernia. Musculoskeletal: There are no acute or  suspicious osseous abnormalities. There is facet arthropathy in the lumbar spine was prominent at at L5-S1. Disc space narrowing and prominent Schmorl's node at T11-T12. IMPRESSION: 1. No renal stones or obstructive uropathy. No perinephric edema to suggest pyelonephritis. 2. Probable wall thickening involving the splenic flexure of the colon. This may reflect mild colitis. 3. Haziness of the central mesentery with multiple prominent mesenteric lymph nodes. This may be reactive, however recommend follow-up CT with oral and IV contrast in 3 months to evaluate for resolution or change. Electronically Signed   By: Jeb Levering M.D.   On: 11/03/2015 23:45   I have personally reviewed and evaluated these images and lab results as part of my medical decision-making.   EKG Interpretation None      MDM   Final diagnoses:  Pyelonephritis  Colitis    55 year old female with likely urinary tract infection and colitis contributing to her symptoms. I'm unsure of which one is the exact cause of this time. She also hypokalemic so that was repleted here. I think this is likely secondary to her diarrhea. Will discharge on Augmentin and should cover both UTI and colitis. We'll also advise a week's worth of potassium and follow with her primary doctor to ensure improvement.    New Prescriptions: Discharge Medication List as of 11/04/2015  1:43 PM    START taking these medications   Details  amoxicillin-clavulanate (AUGMENTIN) 875-125 MG tablet Take 1 tablet by mouth 2 (two) times daily. One po bid x 7 days, Starting 11/04/2015, Until Discontinued, Print    !! ondansetron  (ZOFRAN) 4 mg TABS tablet Take 4 tablets by mouth every 8 (eight) hours as needed., Starting 11/04/2015, Until Discontinued, Print    !! ondansetron (ZOFRAN) 4 mg TABS tablet Take 4 tablets by mouth every 8 (eight) hours as needed., Starting 11/04/2015, Until Discontinued, Print    potassium chloride SA (K-DUR,KLOR-CON) 20 MEQ tablet Take 2 tablets (40 mEq total) by mouth 2 (two) times daily., Starting 11/04/2015, Until Discontinued, Print     !! - Potential duplicate medications found. Please discuss with provider.       I have personally and contemperaneously reviewed labs and imaging and used in my decision making as above.   A medical screening exam was performed and I feel the patient has had an appropriate workup for their chief complaint at this time and likelihood of emergent condition existing is low and thus workup can continue on an outpatient basis.. Their vital signs are stable. They have been counseled on decision, discharge, follow up and which symptoms necessitate immediate return to the emergency department.  They verbally stated understanding and agreement with plan and discharged in stable condition.   I personally performed the services described in this documentation, which was scribed in my presence. The recorded information has been reviewed and is accurate.      Merrily Pew, MD 11/05/15 [phone]

## 2015-11-03 NOTE — ED Notes (Signed)
Pt reports back pain, generalized body aches, and diarrhea since Friday. Denies n/v. Denies urinary symptoms.

## 2015-11-03 NOTE — ED Notes (Signed)
Patient given ice chips, okayed by Dr Dayna Barker.

## 2015-11-04 ENCOUNTER — Encounter: Payer: Self-pay | Admitting: Internal Medicine

## 2015-11-04 MED ORDER — ONDANSETRON 4 MG PREPACK (~~LOC~~): 1.0000 | ORAL_TABLET | Freq: Three times a day (TID) | ORAL | Status: DC | PRN

## 2015-11-04 MED ORDER — POTASSIUM CHLORIDE CRYS ER 20 MEQ PO TBCR: 40.0000 meq | EXTENDED_RELEASE_TABLET | Freq: Two times a day (BID) | ORAL | Status: DC

## 2015-11-04 MED ORDER — ONDANSETRON 4 MG PREPACK (~~LOC~~)
1.0000 | ORAL_TABLET | Freq: Three times a day (TID) | ORAL | Status: DC | PRN
Start: 2015-11-04 — End: 2015-11-11

## 2015-11-04 MED ORDER — AMOXICILLIN-POT CLAVULANATE 875-125 MG PO TABS: 1.0000 | ORAL_TABLET | Freq: Two times a day (BID) | ORAL | Status: DC

## 2015-11-04 NOTE — ED Notes (Signed)
Pt states understanding of care given and follow up instructions.  Ambulated from ED with significant other.  Instructed to complete all antibiotics, follow up with PCP

## 2015-11-04 NOTE — ED Notes (Signed)
Patients states that she is having discomfort from lying in bed, ready for discharge.

## 2015-11-05 ENCOUNTER — Encounter: Payer: Self-pay | Admitting: *Deleted

## 2015-11-06 ENCOUNTER — Ambulatory Visit: Payer: Federal, State, Local not specified - PPO | Admitting: Internal Medicine

## 2015-11-09 ENCOUNTER — Encounter: Payer: Self-pay | Admitting: Internal Medicine

## 2015-11-10 DIAGNOSIS — M545 Low back pain: Secondary | ICD-10-CM | POA: Diagnosis not present

## 2015-11-10 DIAGNOSIS — M542 Cervicalgia: Secondary | ICD-10-CM | POA: Diagnosis not present

## 2015-11-10 DIAGNOSIS — M47812 Spondylosis without myelopathy or radiculopathy, cervical region: Secondary | ICD-10-CM | POA: Diagnosis not present

## 2015-11-10 DIAGNOSIS — M4802 Spinal stenosis, cervical region: Secondary | ICD-10-CM | POA: Diagnosis not present

## 2015-11-11 ENCOUNTER — Encounter: Payer: Self-pay | Admitting: Internal Medicine

## 2015-11-11 ENCOUNTER — Ambulatory Visit (INDEPENDENT_AMBULATORY_CARE_PROVIDER_SITE_OTHER): Payer: Federal, State, Local not specified - PPO | Admitting: Internal Medicine

## 2015-11-11 VITALS — BP 124/80 | HR 63 | Temp 98.9°F | Wt 215.0 lb

## 2015-11-11 DIAGNOSIS — R0789 Other chest pain: Secondary | ICD-10-CM

## 2015-11-11 DIAGNOSIS — R197 Diarrhea, unspecified: Secondary | ICD-10-CM | POA: Diagnosis not present

## 2015-11-11 DIAGNOSIS — M5440 Lumbago with sciatica, unspecified side: Secondary | ICD-10-CM

## 2015-11-11 DIAGNOSIS — N390 Urinary tract infection, site not specified: Secondary | ICD-10-CM | POA: Insufficient documentation

## 2015-11-11 DIAGNOSIS — N3000 Acute cystitis without hematuria: Secondary | ICD-10-CM

## 2015-11-11 MED ORDER — DIPHENOXYLATE-ATROPINE 2.5-0.025 MG PO TABS
1.0000 | ORAL_TABLET | Freq: Four times a day (QID) | ORAL | Status: DC | PRN
Start: 2015-11-11 — End: 2016-01-15

## 2015-11-11 MED ORDER — ONDANSETRON 4 MG PREPACK (~~LOC~~): 1.0000 | ORAL_TABLET | Freq: Three times a day (TID) | ORAL | Status: DC | PRN

## 2015-11-11 MED FILL — Ondansetron HCl Tab 4 MG: ORAL | Qty: 4 | Status: AC

## 2015-11-11 NOTE — Progress Notes (Signed)
Subjective:  Patient ID: Caroline Wong, female    DOB: [birth year]  Age: 55 y.o. MRN: [id number]  CC: No chief complaint on file.   HPI FELCIA COONTZ presents for LBP and diarrhea - 2 weeks. Pt went to ER last week: "55 year old female with likely urinary tract infection and colitis contributing to her symptoms. I'm unsure of which one is the exact cause of this time. She also hypokalemic so that was repleted here. I think this is likely secondary to her diarrhea. Will discharge on Augmentin and should cover both UTI and colitis" Pain in the chest w/eating  Outpatient Prescriptions Prior to Visit  Medication Sig Dispense Refill  . amLODipine (NORVASC) 5 MG tablet Take 1 tablet (5 mg total) by mouth daily. 30 tablet 11  . cholecalciferol (VITAMIN D) 1000 UNITS tablet Take 1 tablet (1,000 Units total) by mouth daily. (Patient taking differently: Take 5,000 Units by mouth daily. ) 100 tablet 3  . diphenhydrAMINE (BENADRYL) 25 MG tablet Take 2 tablets (50 mg total) by mouth every 4 (four) hours as needed (bee sting). 30 tablet 3  . estradiol (EVAMIST) 1.53 MG/SPRAY transdermal spray Place 1 spray onto the skin daily. 8.1 mL 12  . LORazepam (ATIVAN) 0.5 MG tablet take 1 tablet by mouth at bedtime 30 tablet 5  . metoprolol succinate (TOPROL XL) 50 MG 24 hr tablet Take 1 tablet (50 mg total) by mouth every evening. Take with or immediately following a meal. (Patient taking differently: Take 25 mg by mouth every evening. Take with or immediately following a meal.) 30 tablet 11  . olmesartan-hydrochlorothiazide (BENICAR HCT) 40-12.5 MG tablet Take 1 tablet by mouth daily. 30 tablet 11  . Omega-3 Fatty Acids (FISH OIL PO) Take 1 capsule by mouth daily.    [name] omeprazole (PRILOSEC) 40 MG capsule Take 40 mg by mouth 2 (two) times daily.  1  . potassium chloride SA (K-DUR,KLOR-CON) 20 MEQ tablet Take 2 tablets (40 mEq total) by mouth 2 (two) times daily. 28 tablet 0  . progesterone (PROMETRIUM) 200 MG  capsule Take 1 capsule (200 mg total) by mouth at bedtime. 30 capsule 12  . traMADol (ULTRAM) 50 MG tablet Take 0.5-1 tablets (25-50 mg total) by mouth 2 (two) times daily as needed for severe pain. 60 tablet 2  . valACYclovir (VALTREX) 1000 MG tablet take 1 tablet by mouth once daily 30 tablet PRN  . ondansetron (ZOFRAN) 4 mg TABS tablet Take 4 tablets by mouth every 8 (eight) hours as needed. (Patient not taking: Reported on 11/11/2015) 4 tablet 0  . amoxicillin-clavulanate (AUGMENTIN) 875-125 MG tablet Take 1 tablet by mouth 2 (two) times daily. One po bid x 7 days (Patient not taking: Reported on 11/11/2015) 14 tablet 0  . ondansetron (ZOFRAN) 4 mg TABS tablet Take 4 tablets by mouth every 8 (eight) hours as needed. (Patient not taking: Reported on 11/11/2015) 4 tablet 0   Facility-Administered Medications Prior to Visit  Medication Dose Route Frequency Provider Last Rate Last Dose  . aminophylline injection 75 mg  75 mg Intravenous BID PRN Dorothy Spark, MD   75 mg at 04/13/15 1037    ROS Review of Systems  Constitutional: Negative for chills, activity change, appetite change, fatigue and unexpected weight change.  HENT: Negative for congestion, mouth sores and sinus pressure.   Eyes: Negative for visual disturbance.  Respiratory: Positive for chest tightness. Negative for cough.   Gastrointestinal: Negative for nausea and abdominal pain.  Genitourinary: Negative  for frequency, decreased urine volume, difficulty urinating and vaginal pain.  Musculoskeletal: Positive for back pain. Negative for gait problem.  Skin: Negative for pallor and rash.  Neurological: Negative for dizziness, tremors, weakness, numbness and headaches.  Psychiatric/Behavioral: Negative for confusion and sleep disturbance. The patient is nervous/anxious.     Objective:  BP 124/80 mmHg  Pulse 63  Temp(Src) 98.9 F (37.2 C) (Oral)  Wt 215 lb (97.523 kg)  SpO2 97%  LMP 10/17/2015  BP Readings from Last 3  Encounters:  11/11/15 124/80  11/04/15 127/89  10/23/15 140/90    Wt Readings from Last 3 Encounters:  11/11/15 215 lb (97.523 kg)  11/03/15 212 lb (96.163 kg)  10/23/15 215 lb (97.523 kg)    Physical Exam  Lab Results  Component Value Date   WBC 16.1* 11/03/2015   HGB 13.1 11/03/2015   HCT 39.0 11/03/2015   PLT 235 11/03/2015   GLUCOSE 100* 11/03/2015   CHOL 183 10/30/2013   TRIG 93.0 10/30/2013   HDL 57.10 10/30/2013   LDLCALC 107* 10/30/2013   ALT 26 11/03/2015   AST 19 11/03/2015   NA 133* 11/03/2015   K 2.7* 11/03/2015   CL 99* 11/03/2015   CREATININE 0.95 11/03/2015   BUN 17 11/03/2015   CO2 23 11/03/2015   TSH 0.60 05/13/2015   INR 1.02 05/27/2015    Ct Renal Stone Study  11/03/2015  CLINICAL DATA:  Progressive back pain for 4 days. Chills and body aches, nausea. Clinical concern for renal stone or pyelonephritis. EXAM: CT ABDOMEN AND PELVIS WITHOUT CONTRAST TECHNIQUE: Multidetector CT imaging of the abdomen and pelvis was performed following the standard protocol without IV contrast. COMPARISON:  None. FINDINGS: Lower chest:  Subsegmental atelectasis in the lung bases. Liver: Diffusely decreased density consistent with steatosis. No evidence for focal lesion allowing for lack contrast. Hepatobiliary: Post cholecystectomy.  No biliary dilatation. Pancreas: No ductal dilatation or inflammation. Spleen: Normal. Adrenal glands: No nodule. Kidneys: Symmetric in size without stones or hydronephrosis. There is no perinephric stranding. Both ureters are decompressed without stones along the course. Stomach/Bowel: Stomach is decompressed. Bowel evaluation is limited secondary to lack enteric contrast. No definite dilated or thickened small bowel. Liquid and solid stool throughout the colon. Probable wall thickening involving the splenic flexure of the colon. The appendix is normal. Vascular/Lymphatic: Mild central mesenteric edema in the upper mesenteric. Multiple prominent  mesenteric lymph nodes. For example right mid abdomen mesenteric node measures 1.3 cm, series 2, image 50. No retroperitoneal adenopathy. Abdominal aorta is normal in caliber. Reproductive: Uterus is normal. Ovaries not confidently identified. No adnexal mass. Bladder: Physiologically distended. No wall thickening or bladder stone. Other: No free air, free fluid, or intra-abdominal fluid collection. Tiny fat containing umbilical hernia. Musculoskeletal: There are no acute or suspicious osseous abnormalities. There is facet arthropathy in the lumbar spine was prominent at at L5-S1. Disc space narrowing and prominent Schmorl's node at T11-T12. IMPRESSION: 1. No renal stones or obstructive uropathy. No perinephric edema to suggest pyelonephritis. 2. Probable wall thickening involving the splenic flexure of the colon. This may reflect mild colitis. 3. Haziness of the central mesentery with multiple prominent mesenteric lymph nodes. This may be reactive, however recommend follow-up CT with oral and IV contrast in 3 months to evaluate for resolution or change. Electronically Signed   By: Jeb Levering M.D.   On: 11/03/2015 23:45    Assessment & Plan:   There are no diagnoses linked to this encounter. I have discontinued Ms.  Berdan's amoxicillin-clavulanate. I am also having her maintain her diphenhydrAMINE, cholecalciferol, amLODipine, LORazepam, estradiol, progesterone, traMADol, valACYclovir, olmesartan-hydrochlorothiazide, metoprolol succinate, omeprazole, Omega-3 Fatty Acids (FISH OIL PO), ondansetron, and potassium chloride SA.  No orders of the defined types were placed in this encounter.     Follow-up: No Follow-up on file.  Walker Kehr, MD

## 2015-11-11 NOTE — Assessment & Plan Note (Signed)
GI related Dr Carlean Purl

## 2015-11-11 NOTE — Progress Notes (Signed)
Pre visit review using our clinic review tool, if applicable. No additional management support is needed unless otherwise documented below in the visit note. 

## 2015-11-11 NOTE — Assessment & Plan Note (Signed)
R/o IBD - Colitis/arthritis/LBP/UTI GI ref Lomotil

## 2015-11-11 NOTE — Assessment & Plan Note (Signed)
6/17 treated w/Augmentin

## 2015-11-11 NOTE — Assessment & Plan Note (Signed)
On tramadol/flexeril

## 2015-11-13 ENCOUNTER — Encounter: Payer: Self-pay | Admitting: Internal Medicine

## 2015-11-17 ENCOUNTER — Encounter: Payer: Self-pay | Admitting: Internal Medicine

## 2015-11-25 ENCOUNTER — Ambulatory Visit: Payer: Federal, State, Local not specified - PPO | Admitting: Gastroenterology

## 2015-12-02 ENCOUNTER — Ambulatory Visit: Payer: Federal, State, Local not specified - PPO | Admitting: Physician Assistant

## 2015-12-02 ENCOUNTER — Ambulatory Visit (INDEPENDENT_AMBULATORY_CARE_PROVIDER_SITE_OTHER): Payer: Federal, State, Local not specified - PPO | Admitting: Physician Assistant

## 2015-12-02 ENCOUNTER — Encounter: Payer: Self-pay | Admitting: Physician Assistant

## 2015-12-02 VITALS — BP 132/78 | HR 72 | Ht 67.0 in | Wt 214.0 lb

## 2015-12-02 DIAGNOSIS — R110 Nausea: Secondary | ICD-10-CM

## 2015-12-02 DIAGNOSIS — R938 Abnormal findings on diagnostic imaging of other specified body structures: Secondary | ICD-10-CM

## 2015-12-02 DIAGNOSIS — R1084 Generalized abdominal pain: Secondary | ICD-10-CM

## 2015-12-02 DIAGNOSIS — R9389 Abnormal findings on diagnostic imaging of other specified body structures: Secondary | ICD-10-CM

## 2015-12-02 DIAGNOSIS — K219 Gastro-esophageal reflux disease without esophagitis: Secondary | ICD-10-CM | POA: Diagnosis not present

## 2015-12-02 MED ORDER — DICYCLOMINE HCL 10 MG PO CAPS: 10.0000 mg | ORAL_CAPSULE | Freq: Three times a day (TID) | ORAL | Status: DC

## 2015-12-02 MED ORDER — SUCRALFATE 1 GM/10ML PO SUSP: 1.0000 g | Freq: Three times a day (TID) | ORAL | Status: DC

## 2015-12-02 NOTE — Progress Notes (Signed)
Patient ID: Caroline Wong, female   DOB: 31-Oct-1960, 55 y.o.   MRN: [id number]   Subjective:    Patient ID: Caroline Wong, female    DOB: 09-08-1960, 55 y.o.   MRN: [id number]  HPI Caroline Wong is a pleasant 55 year old white female known to Dr. Carlean Purl comes in today for evaluation of abdominal pain and nausea. Patient has history of hypertension, cervical disc disease for which she status post fusion. She had undergone colonoscopy in September 2015 which was a normal exam. She is also status post laparoscopic cholecystectomy in July 2015. Patient states she's been having intermittent abdominal pain since October 2016 which seems to be sporadic and episodic and will completely abate in between episodes. She says her symptoms have progressed and at this point she's having symptoms almost daily. At times is quite uncomfortable and she will have to stop whatever activity she's doing. Pain mouth and last for couple of hours is usually associated with nausea but no vomiting. She's not had any change in her bowel habits does have occasional diarrhea no melena or hematochezia. She says she gets pain and aching across her upper abdomen and some days and on other days across the lower abdomen. She also has ongoing problems with chest pressure and burning and fullness. She is on twice a day Prilosec. She was recently given Carafate and says this seems to help upper abdominal pain and the chest fullness some days but not always. Patient had a recent ER visit to [facility] on 11/03/2015 because of severe back pain. She says she thought she was passing a kidney stone. She was apparently diagnosed with a UTI. She did have an elevated WBC at 16.1, and potassium was 2.7 ,labs otherwise unremarkable. She had a renal protocol noncontrasted CT which did not show any uretero lithiasis, she was noted to have probable wall thickening of the splenic flexure raising question of mild colitis and a haziness of the central  mesentery with multiple mildly prominent mesenteric nodes question reactive. It was recommended to repeat contrasted CT in a couple of months.  Review of Systems Pertinent positive and negative review of systems were noted in the above HPI section.  All other review of systems was otherwise negative.  Outpatient Encounter Prescriptions as of 12/02/2015  Medication Sig  . amLODipine (NORVASC) 5 MG tablet Take 1 tablet (5 mg total) by mouth daily.  . cholecalciferol (VITAMIN D) 1000 UNITS tablet Take 1 tablet (1,000 Units total) by mouth daily. (Patient taking differently: Take 5,000 Units by mouth daily. )  . diphenhydrAMINE (BENADRYL) 25 MG tablet Take 2 tablets (50 mg total) by mouth every 4 (four) hours as needed (bee sting).  . diphenoxylate-atropine (LOMOTIL) 2.5-0.025 MG tablet Take 1 tablet by mouth 4 (four) times daily as needed for diarrhea or loose stools.  [name] estradiol (EVAMIST) 1.53 MG/SPRAY transdermal spray Place 1 spray onto the skin daily.  [name] LORazepam (ATIVAN) 0.5 MG tablet take 1 tablet by mouth at bedtime  . metoprolol succinate (TOPROL XL) 50 MG 24 hr tablet Take 1 tablet (50 mg total) by mouth every evening. Take with or immediately following a meal. (Patient taking differently: Take 25 mg by mouth every evening. Take with or immediately following a meal.)  . olmesartan-hydrochlorothiazide (BENICAR HCT) 40-12.5 MG tablet Take 1 tablet by mouth daily.  . Omega-3 Fatty Acids (FISH OIL PO) Take 1 capsule by mouth daily.  [name] omeprazole (PRILOSEC) 40 MG capsule Take 40 mg by mouth 2 (two)  times daily.  . ondansetron (ZOFRAN) 4 mg TABS tablet Take 4 tablets by mouth every 8 (eight) hours as needed.  . Probiotic Product (PROBIOTIC DAILY PO) Take 1 capsule by mouth daily.  . progesterone (PROMETRIUM) 200 MG capsule Take 1 capsule (200 mg total) by mouth at bedtime.  . traMADol (ULTRAM) 50 MG tablet Take 0.5-1 tablets (25-50 mg total) by mouth 2 (two) times daily as needed for severe pain.   . valACYclovir (VALTREX) 1000 MG tablet take 1 tablet by mouth once daily  . dicyclomine (BENTYL) 10 MG capsule Take 1 capsule (10 mg total) by mouth 3 (three) times daily before meals.  . sucralfate (CARAFATE) 1 GM/10ML suspension Take 10 mLs (1 g total) by mouth 4 (four) times daily -  with meals and at bedtime.  . [DISCONTINUED] potassium chloride SA (K-DUR,KLOR-CON) 20 MEQ tablet Take 2 tablets (40 mEq total) by mouth 2 (two) times daily.   Facility-Administered Encounter Medications as of 12/02/2015  Medication  . aminophylline injection 75 mg   Allergies  Allergen Reactions  . Bee Venom Swelling  . Biaxin [Clarithromycin] Swelling  . Betadine [Povidone Iodine] Rash  . Chloraprep One Step [Chlorhexidine Gluconate] Rash  . Duraprep [employer], Misc.] Rash  . Keflex [Cephalexin] Rash   Patient Active Problem List   Diagnosis Date Noted  . UTI (urinary tract infection) 11/11/2015  . Diarrhea 11/11/2015  . GERD (gastroesophageal reflux disease) 10/23/2015  . BP check 09/24/2015  . Urge incontinence 08/19/2015  . Stye external 07/15/2015  . S/P cervical spinal fusion 06/05/2015  . Acute sinusitis 05/20/2015  . Fatigue 05/13/2015  . Edema 04/01/2015  . Chest pain 04/01/2015  . Nonspecific abnormal electrocardiogram (ECG) (EKG) 04/01/2015  . 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 Neck pain, bilateral posterior 12/31/2014  . Lower back pain 12/31/2014  . Yeast infection 05/07/2014  . Obesity 05/07/2014  . Night sweats 04/14/2014  . Abdominal pain 11/25/2013  . Hormone replacement therapy (HRT) 08/21/2013  . Preventative health care 05/16/2013  . Hypertension    Social History   Social History  . Marital Status: Married    Spouse Name: [name]  . Number of Children: 4  . Years of Education: college   Occupational History  . Delivery Supervisor [employer]    Social History Main Topics  . Smoking status: Never Smoker   . Smokeless tobacco: Never Used  .  Alcohol Use: 0.0 oz/week    0 Standard drinks or equivalent per week     Comment: rarely  . Drug Use: No  . Sexual Activity: Yes    Birth Control/ Protection: None   Other Topics Concern  . 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 Iwan's family history includes Arthritis in her maternal grandfather, maternal grandmother, paternal grandfather, and paternal grandmother; Asthma in her maternal grandmother; Breast cancer in her mother and paternal grandmother; Cancer in her maternal grandfather; Colon cancer in her paternal grandfather; Diabetes in her brother, maternal grandmother, mother, and paternal grandfather; Emphysema in her father; Fibromyalgia in her mother; Heart disease in her father, maternal grandfather, maternal grandmother, mother, paternal grandfather, and paternal grandmother; Hypertension in her brother, brother, and mother; Kidney disease in her mother; Stomach cancer in her paternal grandfather; Stroke in her paternal grandmother. There is no history of Esophageal cancer, Pancreatic cancer, or Rectal cancer.  Objective:    Filed Vitals:   12/02/15 1043  BP: 132/78  Pulse: 72    Physical Exam   well-developed white female in no acute distress, accompanied by her husband, pleasant blood pressure 132 over 78 pulse 72 height 5 foot 7 weight 214 BMI 33. HEENT; nontraumatic, cephalic EOMI PERRLA sclera anicteric, Cardiovascular; regular rate and rhythm with S1-S2 no murmur or gallop, Pulmonary ;clear bilaterally, Abdomen; large soft rather diffuse mild tenderness nonfocal some epigastric tenderness no guarding or rebound no palpable mass or hepatosplenomegaly bowel sounds active, Rectal; exam not done remedies no clubbing cyanosis or edema skin warm and dry, Neuropsych ;mood and affect appropriate     Assessment & Plan:   #67  55 year old white  female with 8 month history of intermittent abdominal pain which is occurring almost daily now upper and lower abdomen, associated with nausea. Etiology is not clear. Recent noncontrasted renal protocol CT raised question of a mild colitis at the splenic flexure of the colon and also showed central mesenteric haziness and multiple mildly prominent mesenteric nodes possibly reactive. #2 chronic GERD and complaints of subxiphoid pressure and burning despite twice a day PPI #3 hypertension #4 cervical disc disease that is post fusion  Plan; continue Prilosec 40 mg by mouth twice a day for now Refill Carafate liquid 1 g 3 times a day between meals Add trial of Bentyl 10 mg by mouth 3 times a day when necessary Will schedule for EGD with Dr. Carlean Purl. Procedure discussed in detail with patient , occluding risks and benefits and she is agreeable to proceed. Will schedule for follow-up CT abdomen and pelvis with IV and oral contrast Further plans pending results of above.  Sims Laday S Leathia Farnell PA-C 12/02/2015   Cc: Plotnikov, [name], MD

## 2015-12-02 NOTE — Patient Instructions (Addendum)
You have been scheduled for an endoscopy. Please follow written instructions given to you at your visit today. If you use inhalers (even only as needed), please bring them with you on the day of your procedure. Your physician has requested that you go to www.startemmi.com and enter the access code given to you at your visit today. This web site gives a general overview about your procedure. However, you should still follow specific instructions given to you by our office regarding your preparation for the procedure.   You have been scheduled for a CT scan of the abdomen and pelvis at [facility] ([address]---this is in the same building as [facility]).   You are scheduled on Wes 12-09-2015 at Wednesday at 3:30 PM. You should arrive  At 3:15 minutes prior to your appointment time for registration. Please follow the written instructions below on the day of your exam:  WARNING: IF YOU ARE ALLERGIC TO IODINE/X-RAY DYE, PLEASE NOTIFY RADIOLOGY IMMEDIATELY AT [id number]! YOU WILL BE GIVEN A 13 HOUR PREMEDICATION PREP.  1) Do not eat or drink anything after  (4 hours prior to your test) 2) You have been given 2 bottles of oral contrast to drink. The solution may taste  better if refrigerated, but do NOT add ice or any other liquid to this solution. Shake  well before drinking.    Drink 1 bottle of contrast @ 1:30 PM (2 hours prior to your exam)  Drink 1 bottle of contrast @ 2:30 PM (1 hour prior to your exam)  You may take any medications as prescribed with a small amount of water except for the following: Metformin, Glucophage, Glucovance, Avandamet, Riomet, Fortamet, Actoplus Met, Janumet, Glumetza or Metaglip. The above medications must be held the day of the exam AND 48 hours after the exam.  The purpose of you drinking the oral contrast is to aid in the visualization of your intestinal tract. The contrast solution may cause some diarrhea. Before your exam is started, you  will be given a small amount of fluid to drink. Depending on your individual set of symptoms, you may also receive an intravenous injection of x-ray contrast/dye. Plan on being at [facility] for 30 minutes or long, depending on the type of exam you are having performed.  If you have any questions regarding your exam or if you need to reschedule, you may call the CT department at [phone] between the hours of 8:00 am and 5:00 pm, Monday-Friday.  ________________________________________________________________________ We sent refills for the Bentyl 10 mg and Carafate liquid to [facility], [place], [place].

## 2015-12-07 ENCOUNTER — Telehealth: Payer: Self-pay | Admitting: Internal Medicine

## 2015-12-07 NOTE — Telephone Encounter (Signed)
New message        Please call the pt she states someone here from the heart care center called her.

## 2015-12-09 ENCOUNTER — Encounter: Payer: Self-pay | Admitting: Physician Assistant

## 2015-12-09 ENCOUNTER — Ambulatory Visit (INDEPENDENT_AMBULATORY_CARE_PROVIDER_SITE_OTHER)
Admission: RE | Admit: 2015-12-09 | Discharge: 2015-12-09 | Disposition: A | Discharge status: Home / Self Care | Payer: Federal, State, Local not specified - PPO | Source: Ambulatory Visit | Attending: Physician Assistant | Admitting: Physician Assistant

## 2015-12-09 DIAGNOSIS — K219 Gastro-esophageal reflux disease without esophagitis: Secondary | ICD-10-CM | POA: Diagnosis not present

## 2015-12-09 DIAGNOSIS — R938 Abnormal findings on diagnostic imaging of other specified body structures: Secondary | ICD-10-CM

## 2015-12-09 DIAGNOSIS — R1084 Generalized abdominal pain: Secondary | ICD-10-CM | POA: Diagnosis not present

## 2015-12-09 DIAGNOSIS — R110 Nausea: Secondary | ICD-10-CM | POA: Diagnosis not present

## 2015-12-09 DIAGNOSIS — R109 Unspecified abdominal pain: Secondary | ICD-10-CM | POA: Diagnosis not present

## 2015-12-09 DIAGNOSIS — R9389 Abnormal findings on diagnostic imaging of other specified body structures: Secondary | ICD-10-CM

## 2015-12-09 MED ORDER — IOPAMIDOL (ISOVUE-300) INJECTION 61%
100.0000 mL | Freq: Once | INTRAVENOUS | Status: AC | PRN
  Administered 2015-12-09: 100 mL via INTRAVENOUS

## 2015-12-14 ENCOUNTER — Encounter: Payer: Self-pay | Admitting: Internal Medicine

## 2015-12-14 ENCOUNTER — Ambulatory Visit (AMBULATORY_SURGERY_CENTER): Payer: Federal, State, Local not specified - PPO | Admitting: Internal Medicine

## 2015-12-14 VITALS — BP 132/86 | HR 63 | Temp 98.7°F | Resp 16 | Ht 67.0 in | Wt 214.0 lb

## 2015-12-14 DIAGNOSIS — R938 Abnormal findings on diagnostic imaging of other specified body structures: Secondary | ICD-10-CM

## 2015-12-14 DIAGNOSIS — R197 Diarrhea, unspecified: Secondary | ICD-10-CM

## 2015-12-14 DIAGNOSIS — R079 Chest pain, unspecified: Secondary | ICD-10-CM | POA: Diagnosis not present

## 2015-12-14 DIAGNOSIS — K3189 Other diseases of stomach and duodenum: Secondary | ICD-10-CM | POA: Diagnosis not present

## 2015-12-14 DIAGNOSIS — R9389 Abnormal findings on diagnostic imaging of other specified body structures: Secondary | ICD-10-CM

## 2015-12-14 DIAGNOSIS — R110 Nausea: Secondary | ICD-10-CM | POA: Diagnosis not present

## 2015-12-14 MED ORDER — SODIUM CHLORIDE 0.9 % IV SOLN: 500.0000 mL | INTRAVENOUS | Status: DC

## 2015-12-14 NOTE — Progress Notes (Signed)
Report given to PACU RN, vss 

## 2015-12-14 NOTE — Progress Notes (Signed)
Called to room to assist during endoscopic procedure.  Patient ID and intended procedure confirmed with present staff. Received instructions for my participation in the procedure from the performing physician.  

## 2015-12-14 NOTE — Op Note (Signed)
[facility] Patient Name: Caroline Wong Procedure Date: 12/14/2015 10:18 AM MRN: [id number] Endoscopist: Gatha Mayer , MD Age: 55 Referring MD:  Date of Birth: April 07, 1961 Gender: Female Account #: [id number] Procedure:                Upper GI endoscopy Indications:              Abnormal CT of the GI tract, Chest pain (non                            cardiac), Diarrhea Medicines:                Propofol per Anesthesia, Monitored Anesthesia Care Procedure:                Pre-Anesthesia Assessment:                           - Prior to the procedure, a History and Physical                            was performed, and patient medications and                            allergies were reviewed. The patient's tolerance of                            previous anesthesia was also reviewed. The risks                            and benefits of the procedure and the sedation                            options and risks were discussed with the patient.                            All questions were answered, and informed consent                            was obtained. Prior Anticoagulants: The patient has                            taken no previous anticoagulant or antiplatelet                            agents. ASA Grade Assessment: III - A patient with                            severe systemic disease. After reviewing the risks                            and benefits, the patient was deemed in                            satisfactory condition to undergo the procedure.  After obtaining informed consent, the endoscope was                            passed under direct vision. Throughout the                            procedure, the patient's blood pressure, pulse, and                            oxygen saturations were monitored continuously. The                            Model GIF-[id number]) scope was introduced                            through the  mouth, and advanced to the second part                            of duodenum. The upper GI endoscopy was                            accomplished without difficulty. The patient                            tolerated the procedure well. Scope In: Scope Out: Findings:                 The examined esophagus was normal.                           The entire examined stomach was normal.                           The examined duodenum was normal. Biopsies were                            taken with a cold forceps for histology.                            Verification of patient identification for the                            specimen was done. Estimated blood loss was minimal. Complications:            No immediate complications. Estimated Blood Loss:     Estimated blood loss was minimal. Impression:               - Normal esophagus.                           - Normal stomach.                           - Normal examined duodenum. Biopsied. ? if she has  olmesartan-induced sprue Recommendation:           - Patient has a contact number available for                            emergencies. The signs and symptoms of potential                            delayed complications were discussed with the                            patient. Return to normal activities tomorrow.                            Written discharge instructions were provided to the                            patient.                           - Resume previous diet.                           - Continue present medications.                           - Await pathology results. Gatha Mayer, MD 12/14/2015 10:47:59 AM This report has been signed electronically.

## 2015-12-14 NOTE — Patient Instructions (Addendum)
Things look ok - I did take biopsies of the small intestine because one of your medications (olmesartan) can cause changes there that could cause some of your symptoms. I hope to know results and call by end of week.  I appreciate the opportunity to care for you. Gatha Mayer, MD, FACG     YOU HAD AN ENDOSCOPIC PROCEDURE TODAY AT [facility]:   Refer to the procedure report that was given to you for any specific questions about what was found during the examination.  If the procedure report does not answer your questions, please call your gastroenterologist to clarify.  If you requested that your care partner not be given the details of your procedure findings, then the procedure report has been included in a sealed envelope for you to review at your convenience later.  YOU SHOULD EXPECT: Some feelings of bloating in the abdomen. Passage of more gas than usual.  Walking can help get rid of the air that was put into your GI tract during the procedure and reduce the bloating. If you had a lower endoscopy (such as a colonoscopy or flexible sigmoidoscopy) you may notice spotting of blood in your stool or on the toilet paper. If you underwent a bowel prep for your procedure, you may not have a normal bowel movement for a few days.  Please Note:  You might notice some irritation and congestion in your nose or some drainage.  This is from the oxygen used during your procedure.  There is no need for concern and it should clear up in a day or so.  SYMPTOMS TO REPORT IMMEDIATELY:    Following upper endoscopy (EGD)  Vomiting of blood or coffee ground material  New chest pain or pain under the shoulder blades  Painful or persistently difficult swallowing  New shortness of breath  Fever of 100F or higher  Black, tarry-looking stools  For urgent or emergent issues, a gastroenterologist can be reached at any hour by calling [phone].   DIET: Your first meal  following the procedure should be a small meal and then it is ok to progress to your normal diet. Heavy or fried foods are harder to digest and may make you feel nauseous or bloated.  Likewise, meals heavy in dairy and vegetables can increase bloating.  Drink plenty of fluids but you should avoid alcoholic beverages for 24 hours.  ACTIVITY:  You should plan to take it easy for the rest of today and you should NOT DRIVE or use heavy machinery until tomorrow (because of the sedation medicines used during the test).    FOLLOW UP: Our staff will call the number listed on your records the next business day following your procedure to check on you and address any questions or concerns that you may have regarding the information given to you following your procedure. If we do not reach you, we will leave a message.  However, if you are feeling well and you are not experiencing any problems, there is no need to return our call.  We will assume that you have returned to your regular daily activities without incident.  If any biopsies were taken you will be contacted by phone or by letter within the next 1-3 weeks.  Please call us at [phone] if you have not heard about the biopsies in 3 weeks.    SIGNATURES/CONFIDENTIALITY: You and/or your care partner have signed paperwork which will be entered into your electronic medical record.  These signatures attest to the fact that that the information above on your After Visit Summary has been reviewed and is understood.  Full responsibility of the confidentiality of this discharge information lies with you and/or your care-partner.    Per Dr. Carlean Purl patient okay to return to work December 21, 2015.  Work notes given to patients husband. You may resume your current medications today. Await biopsy results. Please call if any questions or concerns.

## 2015-12-14 NOTE — Progress Notes (Signed)
Per Dr. Carlean Purl, pt may have a work note to return to work 12-21-15.  Note was given to pt's husband.  No complaints noted in the recovery room. maw

## 2015-12-15 ENCOUNTER — Telehealth: Payer: Self-pay

## 2015-12-15 NOTE — Telephone Encounter (Signed)
  Follow up Call-  Call back number 12/14/2015 03/03/2014  Post procedure Call Back phone  # [phone] cell  Permission to leave phone message Yes Yes     Patient questions:  Do you have a fever, pain , or abdominal swelling? No. Pain Score  0 *  Have you tolerated food without any problems? Yes.    Have you been able to return to your normal activities? Yes.    Do you have any questions about your discharge instructions: Diet   No. Medications  No. Follow up visit  No.  Do you have questions or concerns about your Care? No.  Actions: * If pain score is 4 or above: No action needed, pain <4.  Per the pt, she was hoarse this am.  No other sx noted.  I advised her if this does not resolve within a few days to please call us back.  Awaiting bx results. maw

## 2015-12-21 ENCOUNTER — Encounter: Payer: Self-pay | Admitting: Internal Medicine

## 2015-12-21 NOTE — Progress Notes (Signed)
Quick Note:  Biopsies of duodenum are NL My Chart message to her No letter/recall ______

## 2015-12-21 NOTE — Progress Notes (Signed)
Agree with Ms. Esterwood's assessment and plan. Daesha Insco E. Drewey Begue, MD, FACG   

## 2015-12-25 ENCOUNTER — Encounter: Payer: Self-pay | Admitting: Internal Medicine

## 2015-12-25 ENCOUNTER — Ambulatory Visit (INDEPENDENT_AMBULATORY_CARE_PROVIDER_SITE_OTHER): Payer: Federal, State, Local not specified - PPO | Admitting: Internal Medicine

## 2015-12-25 VITALS — BP 140/88 | HR 64 | Ht 67.0 in | Wt 218.0 lb

## 2015-12-25 DIAGNOSIS — R1010 Upper abdominal pain, unspecified: Secondary | ICD-10-CM | POA: Diagnosis not present

## 2015-12-25 DIAGNOSIS — K589 Irritable bowel syndrome without diarrhea: Secondary | ICD-10-CM

## 2015-12-25 DIAGNOSIS — R0782 Intercostal pain: Secondary | ICD-10-CM

## 2015-12-25 DIAGNOSIS — M5489 Other dorsalgia: Secondary | ICD-10-CM

## 2015-12-25 MED ORDER — CYCLOBENZAPRINE HCL 10 MG PO TABS: 10.0000 mg | ORAL_TABLET | Freq: Three times a day (TID) | ORAL | Status: DC | PRN

## 2015-12-25 MED ORDER — PREDNISONE 10 MG PO TABS: ORAL_TABLET | ORAL | Status: DC

## 2015-12-25 NOTE — Patient Instructions (Addendum)
We have sent the following medications to your pharmacy for you to pick up at your convenience: Flexeril, Prednisone   Take the prednisone as follows: 4 tablets x 3 days, 3 tablets x 3 days, 2 tablets x 3 days, 1 tablet x 3 days and then Stop.    We will see you again 03/02/16.    I appreciate the opportunity to care for you. Silvano Rusk, MD, [facility]

## 2015-12-25 NOTE — Progress Notes (Signed)
Subjective:    Patient ID: Caroline Wong, female    DOB: [birth year], 55 y.o.   MRN: [id number] Chief complaint: Upper abdominal pain and chest pain HPI The patient is here for follow-up with her husband who contributes to the history. She has had problems for several months, we saw her after she had a diarrheal illness and a question of colitis and some mesenteric adenopathy in late May. She went on to have an upper endoscopy which was unrevealing including biopsies of the stomach and duodenum. She is describing improved diarrhea though she still has more frequent bowel movements, much less nausea but she has intermittent episodic upper abdominal and lower chest pain that is very uncomfortable but poorly characterized other than that. It radiates into her intrascapular area which scares her because her grandmother always had heart attack symptoms like that. She has had chest CT abdominal pelvic CT 21 with an 1 without contrast, she's also had a stress test all of which is been unrevealing though she does have thoracic spine degenerative disease. She is status post cervical spine surgery having had a C5-C7 anterior cervical fusion on December 3. by Dr. Lorin Mercy in December 2016 and that helped her headaches and radicular symptoms radiating into her arm. Tramadol helps this pain and she has which can last for hours. None of the GI medicine that we have her on do. He has not had problems like this before. She understands that the studies have been unrevealing and that is good but she is still frustrated and bothered by the symptoms. 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  Review of Systems As  above    Objective:   Physical Exam No acute distress BP 140/88 mmHg  Pulse 64  Ht 5\' 7"  (1.702 m)  Wt 218 lb (98.884 kg)  BMI 34.14 kg/m2  SpO2 98%  LMP 10/17/2015 Chest wall is nontender abdomen shows minimal epigastric tenderness but not worse with muscular tension is no overt obvious symptomatic hernia detected Back is nontender, this lower cervical thoracic and lumbar sacral areas not tender.  I reviewed all of the imaging study results with the patient as above.     Assessment & Plan:   1. Intercostal pain   2. Upper abdominal pain   3. Interscapular pain   4. 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 These other pain symptoms are of unclear etiology though they could be musculoskeletal or neuropathic in origin. I'm going to try a prednisone taper over 12 days and muscle relaxer with Flexeril. She has a follow-up with Dr. Lorin Mercy and though it's probably not coming from her thoracic spine I told her to ask him about that. She will see me in September sooner if needed. Hopefully this will improve with time at a minimum she is reassured today. I had considered scheduling a colonoscopy because she had suggestion of possible ileocecal valve changes on CT scan of the abdomen and pelvis but she had a colonoscopy in 2015 and now that I hear the symptoms in  person and examined her I don't think that would be helpful.   Current outpatient prescriptions:  .  amLODipine (NORVASC) 5 MG tablet, Take 1 tablet (5 mg total) by mouth daily., Disp: 30 tablet, Rfl: 11 .  cholecalciferol (VITAMIN D) 1000 UNITS tablet, Take 1 tablet (1,000 Units total) by mouth daily. (Patient taking differently: Take 5,000 Units by mouth daily. ), Disp: 100 tablet, Rfl: 3 .  dicyclomine (BENTYL) 10 MG capsule, Take 1 capsule (10 mg total) by mouth 3 (three) times daily before meals., Disp: 90 capsule, Rfl: 4 .   diphenhydrAMINE (BENADRYL) 25 MG tablet, Take 2 tablets (50 mg total) by mouth every 4 (four) hours as needed (bee sting)., Disp: 30 tablet, Rfl: 3 .  diphenoxylate-atropine (LOMOTIL) 2.5-0.025 MG tablet, Take 1 tablet by mouth 4 (four) times daily as needed for diarrhea or loose stools., Disp: 60 tablet, Rfl: 0 .  estradiol (EVAMIST) 1.53 MG/SPRAY transdermal spray, Place 1 spray onto the skin daily., Disp: 8.1 mL, Rfl: 12 .  LORazepam (ATIVAN) 0.5 MG tablet, take 1 tablet by mouth at bedtime, Disp: 30 tablet, Rfl: 5 .  metoprolol succinate (TOPROL XL) 50 MG 24 hr tablet, Take 1 tablet (50 mg total) by mouth every evening. Take with or immediately following a meal. (Patient taking differently: Take 25 mg by mouth every evening. Take with or immediately following a meal.), Disp: 30 tablet, Rfl: 11 .  olmesartan-hydrochlorothiazide (BENICAR HCT) 40-12.5 MG tablet, Take 1 tablet by mouth daily., Disp: 30 tablet, Rfl: 11 .  Omega-3 Fatty Acids (FISH OIL PO), Take 1 capsule by mouth daily., Disp: , Rfl:  .  omeprazole (PRILOSEC) 40 MG capsule, Take 40 mg by mouth 2 (two) times daily., Disp: , Rfl: 1 .  ondansetron (ZOFRAN) 4 mg TABS tablet, Take 4 tablets by mouth every 8 (eight) hours as needed., Disp: 12 tablet, Rfl: 0 .  Probiotic Product (PROBIOTIC DAILY PO), Take 1 capsule by mouth daily., Disp: , Rfl:  .  progesterone (PROMETRIUM) 200 MG capsule, Take 1 capsule (200 mg total) by mouth at bedtime., Disp: 30 capsule, Rfl: 12 .  sucralfate (CARAFATE) 1 GM/10ML suspension, Take 10 mLs (1 g total) by mouth 4 (four) times daily -  with meals and at bedtime., Disp: 420 mL, Rfl: 4 .  traMADol (ULTRAM) 50 MG tablet, Take 0.5-1 tablets (25-50 mg total) by mouth 2 (two) times daily as needed for severe pain., Disp: 60 tablet, Rfl: 2 .  valACYclovir (VALTREX) 1000 MG tablet, take 1 tablet by mouth once daily, Disp: 30 tablet, Rfl: PRN .  cyclobenzaprine (FLEXERIL) 10 MG tablet, Take 1 tablet (10 mg total) by  mouth 3 (three) times daily as needed for muscle spasms., Disp: 60 tablet, Rfl: 0 .  predniSONE (DELTASONE) 10 MG tablet, Take taper as directed, Disp: 30 tablet, Rfl: 0   CC: Walker Kehr, MD Rodell Perna, MD

## 2015-12-30 ENCOUNTER — Ambulatory Visit: Payer: Federal, State, Local not specified - PPO | Admitting: Internal Medicine

## 2016-01-01 ENCOUNTER — Other Ambulatory Visit: Payer: Self-pay | Admitting: Obstetrics & Gynecology

## 2016-01-15 ENCOUNTER — Other Ambulatory Visit: Payer: Self-pay | Admitting: Internal Medicine

## 2016-01-17 ENCOUNTER — Encounter: Payer: Self-pay | Admitting: Internal Medicine

## 2016-01-18 NOTE — Telephone Encounter (Signed)
Faxed script back to [facility]...lmb 

## 2016-01-22 ENCOUNTER — Encounter: Payer: Self-pay | Admitting: Internal Medicine

## 2016-01-24 ENCOUNTER — Other Ambulatory Visit: Payer: Self-pay | Admitting: Internal Medicine

## 2016-01-24 DIAGNOSIS — R0789 Other chest pain: Secondary | ICD-10-CM
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
## 2016-02-03 ENCOUNTER — Encounter: Payer: Self-pay | Admitting: Cardiovascular Disease

## 2016-02-03 ENCOUNTER — Encounter: Payer: Self-pay | Admitting: Adult Health

## 2016-02-03 ENCOUNTER — Ambulatory Visit (INDEPENDENT_AMBULATORY_CARE_PROVIDER_SITE_OTHER): Payer: Federal, State, Local not specified - PPO | Admitting: Cardiovascular Disease

## 2016-02-03 VITALS — BP 122/90 | HR 64 | Ht 67.0 in | Wt 220.0 lb

## 2016-02-03 DIAGNOSIS — R0782 Intercostal pain: Secondary | ICD-10-CM | POA: Diagnosis not present

## 2016-02-03 NOTE — Patient Instructions (Signed)
Your physician recommends that you schedule a follow-up appointment As Needed   Your physician recommends that you continue on your current medications as directed. Please refer to the Current Medication list given to you today.  You have been give a Rx for Tramadol 50 mg Take 1 Two Times Daily As Needed   Thank you for choosing [facility]!

## 2016-03-02 ENCOUNTER — Ambulatory Visit (INDEPENDENT_AMBULATORY_CARE_PROVIDER_SITE_OTHER): Payer: Federal, State, Local not specified - PPO | Admitting: Podiatry

## 2016-03-02 ENCOUNTER — Ambulatory Visit: Payer: Federal, State, Local not specified - PPO | Admitting: Internal Medicine

## 2016-03-02 DIAGNOSIS — L600 Ingrowing nail: Secondary | ICD-10-CM | POA: Diagnosis not present

## 2016-03-02 NOTE — Patient Instructions (Signed)

## 2016-03-03 NOTE — Progress Notes (Signed)
Subjective:     Patient ID: Caroline Wong, female   DOB: 08-13-1960, 55 y.o.   MRN: [id number]  HPI patient presents with painful ingrown toenail deformity left hallux stating she's tried to get it out herself and soak it   Review of Systems     Objective:   Physical Exam Neurovascular status intact muscle strength adequate with inflammation incurvation of the left hallux lateral border that's painful when pressed and makes shoe gear difficult    Assessment:     Ingrown toenail deformity left hallux lateral border that's painful    Plan:     Reviewed condition and treatment options and patient wants this fixed. I explained procedure and risk and today I infiltrated the left hallux 60 mg Xylocaine Marcaine mixture remove the lateral border exposed matrix and applied phenol 3 applications 30 seconds followed by alcohol lavage and sterile dressing. Gave instructions on soaks and reappoint

## 2016-03-09 ENCOUNTER — Other Ambulatory Visit: Payer: Self-pay | Admitting: Internal Medicine

## 2016-03-10 NOTE — Telephone Encounter (Signed)
Refill called into pharmacy had to leave on pharmacist vm.../LMB

## 2016-04-05 ENCOUNTER — Encounter: Payer: Self-pay | Admitting: Adult Health

## 2016-04-06 ENCOUNTER — Other Ambulatory Visit: Payer: Self-pay | Admitting: Adult Health

## 2016-04-06 MED ORDER — SULFAMETHOXAZOLE-TRIMETHOPRIM 800-160 MG PO TABS: 1.0000 | ORAL_TABLET | Freq: Two times a day (BID) | ORAL | 0 refills | Status: DC

## 2016-04-13 DIAGNOSIS — R5383 Other fatigue: Secondary | ICD-10-CM | POA: Diagnosis not present

## 2016-04-13 DIAGNOSIS — Z8349 Family history of other endocrine, nutritional and metabolic diseases: Secondary | ICD-10-CM | POA: Diagnosis not present

## 2016-04-13 DIAGNOSIS — R635 Abnormal weight gain: Secondary | ICD-10-CM | POA: Diagnosis not present

## 2016-04-13 DIAGNOSIS — I10 Essential (primary) hypertension: Secondary | ICD-10-CM | POA: Diagnosis not present

## 2016-04-14 ENCOUNTER — Other Ambulatory Visit: Payer: Self-pay | Admitting: Internal Medicine

## 2016-04-20 DIAGNOSIS — Z8249 Family history of ischemic heart disease and other diseases of the circulatory system: Secondary | ICD-10-CM | POA: Diagnosis not present

## 2016-04-20 DIAGNOSIS — R609 Edema, unspecified: Secondary | ICD-10-CM | POA: Diagnosis not present

## 2016-04-20 DIAGNOSIS — L680 Hirsutism: Secondary | ICD-10-CM | POA: Diagnosis not present

## 2016-04-20 DIAGNOSIS — Z8349 Family history of other endocrine, nutritional and metabolic diseases: Secondary | ICD-10-CM | POA: Diagnosis not present

## 2016-04-20 DIAGNOSIS — R635 Abnormal weight gain: Secondary | ICD-10-CM | POA: Diagnosis not present

## 2016-04-20 DIAGNOSIS — R0602 Shortness of breath: Secondary | ICD-10-CM | POA: Diagnosis not present

## 2016-04-20 DIAGNOSIS — I10 Essential (primary) hypertension: Secondary | ICD-10-CM | POA: Diagnosis not present

## 2016-04-20 DIAGNOSIS — R5383 Other fatigue: Secondary | ICD-10-CM | POA: Diagnosis not present

## 2016-05-27 DIAGNOSIS — R700 Elevated erythrocyte sedimentation rate: Secondary | ICD-10-CM | POA: Diagnosis not present

## 2016-07-04 ENCOUNTER — Ambulatory Visit (INDEPENDENT_AMBULATORY_CARE_PROVIDER_SITE_OTHER): Payer: Federal, State, Local not specified - PPO | Admitting: Family Medicine

## 2016-07-04 ENCOUNTER — Ambulatory Visit: Payer: Federal, State, Local not specified - PPO | Admitting: Family Medicine

## 2016-07-04 ENCOUNTER — Encounter: Payer: Self-pay | Admitting: Family Medicine

## 2016-07-04 VITALS — BP 152/98 | HR 68 | Temp 98.1°F | Wt 231.0 lb

## 2016-07-04 DIAGNOSIS — H1031 Unspecified acute conjunctivitis, right eye: Secondary | ICD-10-CM

## 2016-07-04 MED ORDER — POLYMYXIN B-TRIMETHOPRIM 10000-0.1 UNIT/ML-% OP SOLN: 1.0000 [drp] | OPHTHALMIC | 0 refills | Status: DC

## 2016-07-04 NOTE — Patient Instructions (Signed)
Please take medication as directed and follow up if symptoms do not improve in 2 to 3 days, worsen, or you develop new symptoms.   Bacterial Conjunctivitis Bacterial conjunctivitis is an infection of the clear membrane that covers the white part of your eye and the inner surface of your eyelid (conjunctiva). When the blood vessels in your conjunctiva become inflamed, your eye becomes red or pink, and it will probably feel itchy. Bacterial conjunctivitis spreads very easily from person to person (is contagious). It also spreads easily from one eye to the other eye. What are the causes? This condition is caused by several common bacteria. You may get the infection if you come into close contact with another person who is infected. You may also come into contact with items that are contaminated with the bacteria, such as a face towel, contact lens solution, or eye makeup. What increases the risk? This condition is more likely to develop in people who:  Are exposed to other people who have the infection.  Wear contact lenses.  Have a sinus infection.  Have had a recent eye injury or surgery.  Have a weak body defense system (immune system).  Have a medical condition that causes dry eyes. What are the signs or symptoms? Symptoms of this condition include:  Eye redness.  Tearing or watery eyes.  Itchy eyes.  Burning feeling in your eyes.  Thick, yellowish discharge from an eye. This may turn into a crust on the eyelid overnight and cause your eyelids to stick together.  Swollen eyelids.  Blurred vision. How is this diagnosed? Your health care provider can diagnose this condition based on your symptoms and medical history. Your health care provider may also take a sample of discharge from your eye to find the cause of your infection. This is rarely done. How is this treated? Treatment for this condition includes:  Antibiotic eye drops or ointment to clear the infection more quickly  and prevent the spread of infection to others.  Oral antibiotic medicines to treat infections that do not respond to drops or ointments, or last longer than 10 days.  Cool, wet cloths (cool compresses) placed on the eyes.  Artificial tears applied 2-6 times a day. Follow these instructions at home: Medicines  Take or apply your antibiotic medicine as told by your health care provider. Do not stop taking or applying the antibiotic even if you start to feel better.  Take or apply over-the-counter and prescription medicines only as told by your health care provider.  Be very careful to avoid touching the edge of your eyelid with the eye drop bottle or the ointment tube when you apply medicines to the affected eye. This will keep you from spreading the infection to your other eye or to other people. Managing discomfort  Gently wipe away any drainage from your eye with a warm, wet washcloth or a cotton ball.  Apply a cool, clean washcloth to your eye for 10-20 minutes, 3-4 times a day. General instructions  Do not wear contact lenses until the inflammation is gone and your health care provider says it is safe to wear them again. Ask your health care provider how to sterilize or replace your contact lenses before you use them again. Wear glasses until you can resume wearing contacts.  Avoid wearing eye makeup until the inflammation is gone. Throw away any old eye cosmetics that may be contaminated.  Change or wash your pillowcase every day.  Do not share towels or washcloths.  This may spread the infection.  Wash your hands often with soap and water. Use paper towels to dry your hands.  Avoid touching or rubbing your eyes.  Do not drive or use heavy machinery if your vision is blurred. Contact a health care provider if:  You have a fever.  Your symptoms do not get better after 10 days. Get help right away if:  You have a fever and your symptoms suddenly get worse.  You have  severe pain when you move your eye.  You have facial pain, redness, or swelling.  You have sudden loss of vision. This information is not intended to replace advice given to you by your health care provider. Make sure you discuss any questions you have with your health care provider. Document Released: 05/23/2005 Document Revised: 10/01/2015 Document Reviewed: 03/05/2015 Elsevier Interactive Patient Education  2017 [employer].

## 2016-07-04 NOTE — Progress Notes (Signed)
Pre visit review using our clinic review tool, if applicable. No additional management support is needed unless otherwise documented below in the visit note. 

## 2016-07-04 NOTE — Progress Notes (Signed)
Subjective:    Patient ID: Caroline Wong, female    DOB: 12-05-1960, 56 y.o.   MRN: [id number]  HPI  Caroline Wong is a 56 year old female who presents today with conjunctivitis that started this morning in her right eye.  Associated itching, burning, and yellow drainage with tearing was noted this afternoon.  She denies fever, chills, sweats, sinus symptoms, nasal congestion or drainage. She denies visual changes or disturbances.  No treatment has been tried at home.    Review of Systems  Constitutional: Negative for chills and fever.  HENT: Negative for congestion, postnasal drip, rhinorrhea, sinus pain, sinus pressure and sore throat.   Eyes: Positive for discharge, redness and itching. Negative for visual disturbance.  Respiratory: Negative for cough and wheezing.   Cardiovascular: Negative for chest pain and palpitations.  Gastrointestinal: Negative for abdominal pain, diarrhea, nausea and vomiting.  Musculoskeletal: Negative for myalgias.   Past Medical History:  Diagnosis Date  . Abdominal pain 11/25/2013   Has some nausea associated with pain that has been on and off x 2 weeks will get [place]  . Anxiety    Takes Lorazepam  . Chronic bronchitis ([place])   . Gallstones   . GERD (gastroesophageal reflux disease)   . Hormone replacement therapy (HRT)   . Hypertension   . Night sweats 04/14/2014  . Obesity 05/07/2014  . Urge incontinence 08/19/2015  . Yeast infection 05/07/2014     Social History   Social History  . Marital status: Married    Spouse name: Caroline Wong  . Number of children: 4  . Years of education: college   Occupational History  . 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 Smoking status: Never Smoker  . Smokeless tobacco: Never Used  . Alcohol use 0.0 oz/week     Comment: rarely  . Drug use: No  . Sexual activity: Yes    Birth control/ protection: None   Other Topics Concern  . 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 ANTERIOR CERVICAL DECOMP/DISCECTOMY FUSION N/A 06/05/2015   Procedure: C5-6, C6-7 Anterior Cervical Discectomy and Fusion, Allograft, Plate;  Surgeon: [name], MD;  Location: [facility];  Service: Orthopedics;  Laterality: N/A;  . ANTERIOR FUSION CERVICAL SPINE  06/05/2015   C 5 6 7    . CHOLECYSTECTOMY    . COLONOSCOPY    . FOOT SURGERY Left   . MOLE REMOVAL    . WISDOM TOOTH EXTRACTION      Family History  Problem Relation Age of Onset  . Breast cancer Mother     breast  . Diabetes Mother   . Fibromyalgia Mother   . Heart disease Mother     CHF  . Hypertension Mother   . Kidney disease Mother   . Heart disease Father   . Emphysema Father   . Diabetes Brother   . Hypertension Brother     x 2  . Heart disease Maternal Grandmother   . Diabetes Maternal Grandmother   . Asthma Maternal Grandmother   . Arthritis Maternal Grandmother   . Heart disease Maternal Grandfather   . Arthritis Maternal Grandfather   . Cancer Maternal Grandfather     bladder  . Breast cancer Paternal Grandmother   .  Heart disease Paternal Grandmother   . Arthritis Paternal Grandmother   . Stroke Paternal Grandmother   . Diabetes Paternal Grandfather   . Heart disease Paternal Grandfather   . Colon cancer Paternal Grandfather     mets  . Arthritis Paternal Grandfather   . Stomach cancer Paternal Grandfather     mets to stomach  . Hypertension Brother   . Esophageal cancer Neg Hx   . Pancreatic cancer Neg Hx   . Rectal cancer Neg Hx     Allergies  Allergen Reactions  . Bee Venom Swelling  . Biaxin [Clarithromycin] Swelling  . Betadine [Povidone Iodine] Rash  . Chloraprep One Step [Chlorhexidine Gluconate] Rash  . Duraprep [employer], Misc.] Rash  . Keflex [Cephalexin] Rash    Current Outpatient  Prescriptions on File Prior to Visit  Medication Sig Dispense Refill  . amLODipine (NORVASC) 5 MG tablet take 1 tablet by mouth once daily 30 tablet 5  . cyclobenzaprine (FLEXERIL) 10 MG tablet Take 1 tablet (10 mg total) by mouth 3 (three) times daily as needed for muscle spasms. 60 tablet 0  . diphenhydrAMINE (BENADRYL) 25 MG tablet Take 2 tablets (50 mg total) by mouth every 4 (four) hours as needed (bee sting). 30 tablet 3  . LORazepam (ATIVAN) 0.5 MG tablet take 1 tablet by mouth at bedtime 30 tablet 5  . metoprolol succinate (TOPROL XL) 50 MG 24 hr tablet Take 1 tablet (50 mg total) by mouth every evening. Take with or immediately following a meal. (Patient taking differently: Take 25 mg by mouth every evening. Take with or immediately following a meal.) 30 tablet 11  . olmesartan-hydrochlorothiazide (BENICAR HCT) 40-12.5 MG tablet Take 1 tablet by mouth daily. 30 tablet 11  . predniSONE (DELTASONE) 10 MG tablet Take taper as directed 30 tablet 0  . Probiotic Product (PROBIOTIC DAILY PO) Take 1 capsule by mouth daily.    . traMADol (ULTRAM) 50 MG tablet Take 0.5-1 tablets (25-50 mg total) by mouth 2 (two) times daily as needed for severe pain. 60 tablet 2  . valACYclovir (VALTREX) 1000 MG tablet take 1 tablet by mouth once daily 30 tablet PRN  . [DISCONTINUED] RABEprazole (ACIPHEX) 20 MG tablet Take 1 tablet (20 mg total) by mouth 2 (two) times daily. (Patient not taking: Reported on 11/03/2015) 60 tablet 5   Current Facility-Administered Medications on File Prior to Visit  Medication Dose Route Frequency Provider Last Rate Last Dose  . aminophylline injection 75 mg  75 mg Intravenous BID PRN Dorothy Spark, MD   75 mg at 04/13/15 1037    BP (!) 152/98 (BP Location: Left Arm, Patient Position: Sitting, Cuff Size: Large)   Pulse 68   Temp 98.1 F (36.7 C) (Oral)   Wt 231 lb (104.8 kg)   SpO2 98%   BMI 36.18 kg/m   Retake of BP noted as 150/92    Objective:   Physical Exam    Constitutional: She is oriented to person, place, and time. She appears well-developed and well-nourished.  HENT:  Right Ear: Tympanic membrane normal.  Left Ear: Tympanic membrane normal.  Eyes: Pupils are equal, round, and reactive to light. Right eye exhibits no hordeolum. Right conjunctiva is injected. No scleral icterus.  Diffuse conjunctival redness present in right eye with yellow drainage noted in the corners. No photophobia, ciliary flush, HA, or visual changes.  Neck: Neck supple.  Cardiovascular: Normal rate and regular rhythm.   Pulmonary/Chest: Effort normal and breath sounds normal. She  has no wheezes. She has no rales.  Lymphadenopathy:    She has no cervical adenopathy.  Neurological: She is alert and oriented to person, place, and time.  Skin: Skin is warm and dry. No rash noted.      Assessment & Plan:  1. Acute bacterial conjunctivitis of right eye Exam is reassuring; symptoms support treatment for bacterial conjunctivitis. Advised follow up if symptoms do not improve in 3 to 4 days, worsen, or she develops a fever >101, pain in her eye, visual changes. - trimethoprim-polymyxin b (POLYTRIM) ophthalmic solution; Place 1 drop into the right eye every 4 (four) hours.  Dispense: 10 mL; Refill: 0  Delano Metz, FNP-C

## 2016-07-06 ENCOUNTER — Encounter: Payer: Self-pay | Admitting: Family Medicine

## 2016-07-08 ENCOUNTER — Other Ambulatory Visit: Payer: Self-pay | Admitting: Obstetrics & Gynecology

## 2016-07-12 ENCOUNTER — Encounter: Payer: Self-pay | Admitting: Internal Medicine

## 2016-07-14 NOTE — Telephone Encounter (Signed)
OK to fill this/these prescription(s) with additional refills x2  Needs to have an OV every 6 months  Thank you!    ----- Message -----  From: Earnstine Regal, MA  Sent: 07/13/2016  9:02 AM  To: Cassandria Anger, MD  Subject: FW: Non-Urgent Medical Question          MD had ok refill, but med was approved by another MD today " Dr. Tania Ade"...[name]

## 2016-08-02 DIAGNOSIS — D1800 Hemangioma unspecified site: Secondary | ICD-10-CM | POA: Diagnosis not present

## 2016-08-02 DIAGNOSIS — L570 Actinic keratosis: Secondary | ICD-10-CM | POA: Diagnosis not present

## 2016-08-02 DIAGNOSIS — D485 Neoplasm of uncertain behavior of skin: Secondary | ICD-10-CM | POA: Diagnosis not present

## 2016-08-02 DIAGNOSIS — D045 Carcinoma in situ of skin of trunk: Secondary | ICD-10-CM | POA: Diagnosis not present

## 2016-08-03 DIAGNOSIS — L680 Hirsutism: Secondary | ICD-10-CM | POA: Diagnosis not present

## 2016-08-03 DIAGNOSIS — Z8349 Family history of other endocrine, nutritional and metabolic diseases: Secondary | ICD-10-CM | POA: Diagnosis not present

## 2016-08-03 DIAGNOSIS — E669 Obesity, unspecified: Secondary | ICD-10-CM | POA: Diagnosis not present

## 2016-08-03 DIAGNOSIS — I10 Essential (primary) hypertension: Secondary | ICD-10-CM | POA: Diagnosis not present

## 2016-08-18 ENCOUNTER — Encounter: Payer: Self-pay | Admitting: Adult Health

## 2016-08-18 DIAGNOSIS — Z1231 Encounter for screening mammogram for malignant neoplasm of breast: Secondary | ICD-10-CM | POA: Diagnosis not present

## 2016-08-23 ENCOUNTER — Ambulatory Visit (INDEPENDENT_AMBULATORY_CARE_PROVIDER_SITE_OTHER): Payer: Federal, State, Local not specified - PPO | Admitting: Adult Health

## 2016-08-23 ENCOUNTER — Encounter: Payer: Self-pay | Admitting: Adult Health

## 2016-08-23 VITALS — BP 140/90 | HR 70 | Ht 67.25 in | Wt 224.0 lb

## 2016-08-23 DIAGNOSIS — Z01419 Encounter for gynecological examination (general) (routine) without abnormal findings: Secondary | ICD-10-CM

## 2016-08-23 DIAGNOSIS — Z1211 Encounter for screening for malignant neoplasm of colon: Secondary | ICD-10-CM

## 2016-08-23 DIAGNOSIS — R140 Abdominal distension (gaseous): Secondary | ICD-10-CM

## 2016-08-23 DIAGNOSIS — Z1212 Encounter for screening for malignant neoplasm of rectum: Secondary | ICD-10-CM

## 2016-08-23 LAB — HEMOCCULT GUIAC POC 1CARD (OFFICE): FECAL OCCULT BLD: NEGATIVE

## 2016-08-23 MED ORDER — ALIGN PO CAPS: 1.0000 | ORAL_CAPSULE | Freq: Every day | ORAL | Status: DC

## 2016-08-23 NOTE — Progress Notes (Signed)
Patient ID: Caroline Wong, female   DOB: 1960-11-26, 56 y.o.   MRN: [id number] History of Present Illness: Caroline Wong is a 56 year old white female,married in for a well woman gyn exam,she had a normal pap with negative HPV 08/19/15.   Current Medications, Allergies, Past Medical History, Past Surgical History, Family History and Social History were reviewed in [facility] record.     Review of Systems: Patient denies any headaches, hearing loss, fatigue, blurred vision, shortness of breath, chest pain, abdominal pain, problems with bowel movements, urination, or intercourse. No joint pain or mood swings.+gassiness esp at night,swelling at times.Has had stress test and colonoscopy and EGD.     Physical Exam:BP 140/90 (BP Location: Left Arm, Patient Position: Sitting, Cuff Size: Large)   Pulse 70   Ht 5' 7.25" (1.708 m)   Wt 224 lb (101.6 kg)   LMP 07/21/2016 (Approximate)   BMI 34.82 kg/m  General:  Well developed, well nourished, no acute distress Skin:  Warm and dry Neck:  Midline trachea, normal thyroid, good ROM, no lymphadenopathy Lungs; Clear to auscultation bilaterally Breast:  No dominant palpable mass, retraction, or nipple discharge Cardiovascular: Regular rate and rhythm Abdomen:  Soft, non tender, no hepatosplenomegaly Pelvic:  External genitalia is normal in appearance, no lesions.  The vagina is normal in appearance. Urethra has no lesions or masses. The cervix is bulbous.  Uterus is felt to be normal size, shape, and contour.  No adnexal masses or tenderness noted.Bladder is non tender, no masses felt. Rectal: Good sphincter tone, no polyps, or hemorrhoids felt.  Hemoccult negative. Extremities/musculoskeletal:  No swelling or varicosities noted, no clubbing or cyanosis Psych:  No mood changes, alert and cooperative,seems happy PHQ 2 score 0.  Impression:  1. Well woman exam with routine gynecological exam   2. Screening for colorectal cancer    3. Gassiness      Plan: Physical in 1 year Pap 2020 Labs with PCP Colonoscopy per GI Try align

## 2016-08-25 DIAGNOSIS — C44529 Squamous cell carcinoma of skin of other part of trunk: Secondary | ICD-10-CM | POA: Diagnosis not present

## 2016-08-29 ENCOUNTER — Other Ambulatory Visit: Payer: Self-pay | Admitting: Internal Medicine

## 2016-08-31 ENCOUNTER — Other Ambulatory Visit: Payer: Self-pay | Admitting: Internal Medicine

## 2016-08-31 NOTE — Telephone Encounter (Signed)
sch ROV

## 2016-08-31 NOTE — Telephone Encounter (Signed)
Called refill into pharmacy had to leave on pharmacist vm.../lmb 

## 2016-09-13 NOTE — Telephone Encounter (Signed)
Called refill into pharmacy spoke w/Amanda gave MD approval.../lmb

## 2016-09-27 ENCOUNTER — Other Ambulatory Visit: Payer: Self-pay | Admitting: Internal Medicine

## 2016-09-27 ENCOUNTER — Encounter: Payer: Self-pay | Admitting: Internal Medicine

## 2016-09-27 NOTE — Telephone Encounter (Signed)
sch OV 

## 2016-09-27 NOTE — Telephone Encounter (Signed)
You have not prescribed this prescription in the past, please advise.

## 2016-10-13 ENCOUNTER — Other Ambulatory Visit: Payer: Self-pay | Admitting: Internal Medicine

## 2016-10-27 ENCOUNTER — Other Ambulatory Visit: Payer: Self-pay | Admitting: Internal Medicine

## 2016-11-03 DIAGNOSIS — L570 Actinic keratosis: Secondary | ICD-10-CM | POA: Diagnosis not present

## 2016-11-03 DIAGNOSIS — L910 Hypertrophic scar: Secondary | ICD-10-CM | POA: Diagnosis not present

## 2016-11-04 ENCOUNTER — Ambulatory Visit: Payer: Federal, State, Local not specified - PPO | Admitting: Podiatry

## 2016-11-07 ENCOUNTER — Ambulatory Visit: Payer: Federal, State, Local not specified - PPO | Admitting: Podiatry

## 2016-11-10 ENCOUNTER — Encounter: Payer: Self-pay | Admitting: Podiatry

## 2016-11-10 ENCOUNTER — Ambulatory Visit (INDEPENDENT_AMBULATORY_CARE_PROVIDER_SITE_OTHER): Payer: Federal, State, Local not specified - PPO | Admitting: Podiatry

## 2016-11-10 DIAGNOSIS — M722 Plantar fascial fibromatosis: Secondary | ICD-10-CM | POA: Diagnosis not present

## 2016-11-10 MED ORDER — TRIAMCINOLONE ACETONIDE 10 MG/ML IJ SUSP
10.0000 mg | Freq: Once | INTRAMUSCULAR | Status: AC
  Administered 2016-11-10: 10 mg

## 2016-11-11 NOTE — Progress Notes (Signed)
Subjective:    Patient ID: Caroline Wong, female   DOB: 56 y.o.   MRN: [id number]   HPI patient states her heels separately started to hurt her again and she no she needs new orthotics    ROS      Objective:  Physical Exam neurovascular status intact with discomfort in the plantar heel region bilateral inflammation fluid around the medial band with mild pain in the right mid arch area and well-healed surgical site left arch. Patient does have depression of the arch     Assessment:    Reoccurrence acute plantar fasciitis bilateral with mid arch pain right     Plan:    H&P x-rays reviewed conditions discussed and today I injected the plantar fascial bilateral 3 mg Kenalog 5 mg Xylocaine and instructed on physical therapy. Patient is scanned for new orthotics will be seen back when ready or earlier if any other recent  X-rays indicate spur with no indication of stress fracture arthritis

## 2016-11-14 ENCOUNTER — Ambulatory Visit (INDEPENDENT_AMBULATORY_CARE_PROVIDER_SITE_OTHER): Payer: Federal, State, Local not specified - PPO | Admitting: Internal Medicine

## 2016-11-14 ENCOUNTER — Encounter: Payer: Self-pay | Admitting: Internal Medicine

## 2016-11-14 VITALS — BP 126/82 | HR 71 | Temp 98.7°F | Ht 67.25 in | Wt 221.0 lb

## 2016-11-14 DIAGNOSIS — I10 Essential (primary) hypertension: Secondary | ICD-10-CM | POA: Diagnosis not present

## 2016-11-14 DIAGNOSIS — F419 Anxiety disorder, unspecified: Secondary | ICD-10-CM | POA: Insufficient documentation

## 2016-11-14 DIAGNOSIS — R0782 Intercostal pain: Secondary | ICD-10-CM

## 2016-11-14 DIAGNOSIS — Z0000 Encounter for general adult medical examination without abnormal findings: Secondary | ICD-10-CM

## 2016-11-14 MED ORDER — LORAZEPAM 0.5 MG PO TABS: 0.5000 mg | ORAL_TABLET | Freq: Every day | ORAL | 5 refills | Status: DC

## 2016-11-14 NOTE — Assessment & Plan Note (Signed)
Cont w/RxHyzaar 100-12.5 Amlodipine 5 mg/d Toprol XL 1/2 tab qd 12.5 mg

## 2016-11-14 NOTE — Progress Notes (Signed)
Subjective:  Patient ID: Caroline Wong, female    DOB: [birth year]  Age: 56 y.o. MRN: [id number]  CC: No chief complaint on file.   HPI Caroline Wong presents for a well exam. C/o  CP for a long time. It is non-exertional. The pt thinks it may be related to her nec/back.  Outpatient Medications Prior to Visit  Medication Sig Dispense Refill  . amLODipine (NORVASC) 5 MG tablet Take 1 tablet (5 mg total) by mouth daily. Overdue for annual appt w/labs must see MD for refills 30 tablet 0  . bifidobacterium infantis (ALIGN) capsule Take 1 capsule by mouth daily.    . Cholecalciferol (VITAMIN D3) 5000 units TABS Take by mouth. Takes 2 daily    . cyclobenzaprine (FLEXERIL) 10 MG tablet Take 1 tablet (10 mg total) by mouth 3 (three) times daily as needed for muscle spasms. 60 tablet 0  . diphenhydrAMINE (BENADRYL) 25 MG tablet Take 2 tablets (50 mg total) by mouth every 4 (four) hours as needed (bee sting). 30 tablet 3  . diphenoxylate-atropine (LOMOTIL) 2.5-0.025 MG tablet take 1 tablet by mouth four times a day if needed 60 tablet 0  . LORazepam (ATIVAN) 0.5 MG tablet take 1 tablet by mouth at bedtime 30 tablet 5  . metFORMIN (GLUCOPHAGE) 500 MG tablet Take 1,000 mg by mouth. Takes 2 at supper    . metoprolol succinate (TOPROL-XL) 50 MG 24 hr tablet Take 1 tablet (50 mg total) by mouth every evening. Annual appt is due must see MD for refills 30 tablet 0  . olmesartan-hydrochlorothiazide (BENICAR HCT) 40-12.5 MG tablet take 1 tablet by mouth once daily 30 tablet 11  . traMADol (ULTRAM) 50 MG tablet take 1 tablet by mouth every 6 hours if needed 60 tablet 1  . valACYclovir (VALTREX) 1000 MG tablet take 1 tablet by mouth once daily 30 tablet PRN   Facility-Administered Medications Prior to Visit  Medication Dose Route Frequency Provider Last Rate Last Dose  . aminophylline injection 75 mg  75 mg Intravenous BID PRN Dorothy Spark, MD   75 mg at 04/13/15 1037    ROS Review of Systems    Constitutional: Negative for activity change, appetite change, chills, fatigue and unexpected weight change.  HENT: Negative for congestion, mouth sores and sinus pressure.   Eyes: Negative for visual disturbance.  Respiratory: Negative for cough and chest tightness.   Cardiovascular: Positive for chest pain.  Gastrointestinal: Negative for abdominal pain and nausea.  Genitourinary: Negative for difficulty urinating, frequency and vaginal pain.  Musculoskeletal: Negative for back pain and gait problem.  Skin: Negative for pallor and rash.  Neurological: Negative for dizziness, tremors, weakness, numbness and headaches.  Psychiatric/Behavioral: Negative for confusion and sleep disturbance. The patient is not nervous/anxious.     Objective:  BP 126/82 (BP Location: Left Arm, Patient Position: Sitting, Cuff Size: Large)   Pulse 71   Temp 98.7 F (37.1 C) (Oral)   Ht 5' 7.25" (1.708 m)   Wt 221 lb (100.2 kg)   SpO2 99%   BMI 34.36 kg/m   BP Readings from Last 3 Encounters:  11/14/16 126/82  08/23/16 140/90  07/04/16 (!) 152/98    Wt Readings from Last 3 Encounters:  11/14/16 221 lb (100.2 kg)  08/23/16 224 lb (101.6 kg)  07/04/16 231 lb (104.8 kg)    Physical Exam  Constitutional: She appears well-developed. No distress.  HENT:  Head: Normocephalic.  Right Ear: External ear normal.  Left Ear:  External ear normal.  Nose: Nose normal.  Mouth/Throat: Oropharynx is clear and moist.  Eyes: Conjunctivae are normal. Pupils are equal, round, and reactive to light. Right eye exhibits no discharge. Left eye exhibits no discharge.  Neck: Normal range of motion. Neck supple. No JVD present. No tracheal deviation present. No thyromegaly present.  Cardiovascular: Normal rate, regular rhythm and normal heart sounds.   Pulmonary/Chest: No stridor. No respiratory distress. She has no wheezes.  Abdominal: Soft. Bowel sounds are normal. She exhibits no distension and no mass. There is no  tenderness. There is no rebound and no guarding.  Musculoskeletal: She exhibits no edema or tenderness.  Lymphadenopathy:    She has no cervical adenopathy.  Neurological: She displays normal reflexes. No cranial nerve deficit. She exhibits normal muscle tone. Coordination normal.  Skin: No rash noted. No erythema.  Psychiatric: She has a normal mood and affect. Her behavior is normal. Judgment and thought content normal.  chest, neck, abd NT  Lab Results  Component Value Date   WBC 16.1 (H) 11/03/2015   HGB 13.1 11/03/2015   HCT 39.0 11/03/2015   PLT 235 11/03/2015   GLUCOSE 100 (H) 11/03/2015   CHOL 183 10/30/2013   TRIG 93.0 10/30/2013   HDL 57.10 10/30/2013   LDLCALC 107 (H) 10/30/2013   ALT 26 11/03/2015   AST 19 11/03/2015   NA 133 (L) 11/03/2015   K 2.7 (LL) 11/03/2015   CL 99 (L) 11/03/2015   CREATININE 0.95 11/03/2015   BUN 17 11/03/2015   CO2 23 11/03/2015   TSH 0.60 05/13/2015   INR 1.02 05/27/2015    Ct Abdomen Pelvis W Contrast  Result Date: 12/09/2015 CLINICAL DATA:  Abdominal pain, nausea, history of esophagitis, possible mesenteric inflammatory process EXAM: CT ABDOMEN AND PELVIS WITH CONTRAST TECHNIQUE: Multidetector CT imaging of the abdomen and pelvis was performed using the standard protocol following bolus administration of intravenous contrast. CONTRAST:  132mL ISOVUE-300 IOPAMIDOL (ISOVUE-300) INJECTION 61% COMPARISON:  CT scan 11/03/2015 FINDINGS: Lower chest:  The lung bases are unremarkable. Hepatobiliary: Enhanced liver is unremarkable. The patient is status postcholecystectomy. No intrahepatic biliary ductal dilatation. Pancreas: Enhanced pancreas is unremarkable. Spleen: Enhanced spleen is unremarkable. Adrenals/Urinary Tract: No adrenal gland mass. Enhanced kidneys are symmetrical in size. No hydronephrosis or hydroureter. Delayed renal images shows bilateral renal symmetrical excretion. Bilateral visualized proximal ureter is unremarkable. The urinary  bladder is unremarkable. Stomach/Bowel: There is no small bowel obstruction. Oral contrast material was given to the patient. No gastric outlet obstruction. Moderate stool noted in right colon. Normal appendix. No pericecal inflammation. There is fecal like material within terminal small bowel please see axial image 74 and coronal image 36. Findings suspicious for distal small bowel ileus. Less likely early small bowel obstruction or incompetent ileocecal valve. Some colonic gas and some stool noted within transverse colon. Some colonic stool and gas noted within sigmoid colon and rectum. No distal colonic obstruction. Vascular/Lymphatic: No aortic aneurysm. Minimal congested vessels in upper mesentery and few scattered mesenteric lymph nodes without evidence of adenopathy or definite mesenteric fluid collection or inflammation. Minimal congestion/ edema in upper mesenteric cannot be excluded. Reproductive: The uterus and ovaries are unremarkable. Other: Small umbilical hernia containing fat without evidence acute complication. There is no inguinal adenopathy. No retroperitoneal adenopathy. Musculoskeletal: No destructive bony lesions are noted. Again noted Schmorl's node deformity upper endplate of U54 vertebral body. Stable disc space flattening at T11-T12 level with mild anterior spurring. IMPRESSION: 1. There is no definite evidence of  mesenteric inflammation or fluid collection. Mild congested upper mesenteric vessels and few scattered lymph nodes probable mild residual edema. Please see axial image 40. 2. Moderate stool noted within cecum. Normal appendix. No pericecal inflammation. There is fecal like material within terminal small bowel please see axial image 74 and coronal image 36. Findings suspicious for distal small bowel ileus less likely early small bowel obstruction or incompetent ileocecal valve. 3. No distal colonic obstruction. No evidence of colitis or diverticulitis. 4. Small umbilical hernia  containing fat without evidence of acute complication. 5. Stable mild degenerative changes thoracolumbar spine. Electronically Signed   By: Lahoma Crocker M.D.   On: 12/09/2015 16:47    Assessment & Plan:   There are no diagnoses linked to this encounter. I am having Ms. Chrissie Noa maintain her diphenhydrAMINE, valACYclovir, cyclobenzaprine, LORazepam, Vitamin D3, metFORMIN, bifidobacterium infantis, traMADol, diphenoxylate-atropine, olmesartan-hydrochlorothiazide, amLODipine, and metoprolol succinate.  No orders of the defined types were placed in this encounter.    Follow-up: No Follow-up on file.  Walker Kehr, MD

## 2016-11-14 NOTE — Assessment & Plan Note (Signed)
Lorazepam  Potential benefits of a long term benzodiazepines  use as well as potential risks  and complications were explained to the patient and were aknowledged. 

## 2016-11-14 NOTE — Assessment & Plan Note (Addendum)
We discussed age appropriate health related issues, including available/recomended screening tests and vaccinations. We discussed a need for adhering to healthy diet and exercise. Labs were ordered to be later reviewed . All questions were answered. Declined Shingrix.

## 2016-11-14 NOTE — Assessment & Plan Note (Signed)
?  etiology chronic with (-) w/up

## 2016-11-18 ENCOUNTER — Other Ambulatory Visit: Payer: Self-pay | Admitting: Internal Medicine

## 2016-11-21 ENCOUNTER — Other Ambulatory Visit (INDEPENDENT_AMBULATORY_CARE_PROVIDER_SITE_OTHER): Payer: Federal, State, Local not specified - PPO

## 2016-11-21 DIAGNOSIS — Z0000 Encounter for general adult medical examination without abnormal findings: Secondary | ICD-10-CM | POA: Diagnosis not present

## 2016-11-21 LAB — CBC WITH DIFFERENTIAL/PLATELET
BASOS ABS: 0.1 10*3/uL (ref 0.0–0.1)
Basophils Relative: 0.9 % (ref 0.0–3.0)
EOS ABS: 0.2 10*3/uL (ref 0.0–0.7)
Eosinophils Relative: 2.2 % (ref 0.0–5.0)
HCT: 41.2 % (ref 36.0–46.0)
Hemoglobin: 13.9 g/dL (ref 12.0–15.0)
LYMPHS ABS: 2.5 10*3/uL (ref 0.7–4.0)
LYMPHS PCT: 26.9 % (ref 12.0–46.0)
MCHC: 33.7 g/dL (ref 30.0–36.0)
MCV: 93.7 fl (ref 78.0–100.0)
MONOS PCT: 7.8 % (ref 3.0–12.0)
Monocytes Absolute: 0.7 10*3/uL (ref 0.1–1.0)
NEUTROS ABS: 5.8 10*3/uL (ref 1.4–7.7)
NEUTROS PCT: 62.2 % (ref 43.0–77.0)
PLATELETS: 281 10*3/uL (ref 150.0–400.0)
RBC: 4.39 Mil/uL (ref 3.87–5.11)
RDW: 12.8 % (ref 11.5–15.5)
WBC: 9.4 10*3/uL (ref 4.0–10.5)

## 2016-11-21 LAB — LIPID PANEL
CHOL/HDL RATIO: 3
Cholesterol: 139 mg/dL (ref 0–200)
HDL: 49.1 mg/dL (ref >39.00)
LDL CALC: 77 mg/dL (ref 0–99)
NONHDL: 90.32
Triglycerides: 68 mg/dL (ref 0.0–149.0)
VLDL: 13.6 mg/dL (ref 0.0–40.0)

## 2016-11-21 LAB — BASIC METABOLIC PANEL
BUN: 30 mg/dL — ABNORMAL HIGH (ref 6–23)
CO2: 29 meq/L (ref 19–32)
CREATININE: 1.06 mg/dL (ref 0.40–1.20)
Calcium: 9.4 mg/dL (ref 8.4–10.5)
Chloride: 103 mEq/L (ref 96–112)
GFR: 57.5 mL/min — AB (ref >60.00)
Glucose, Bld: 103 mg/dL — ABNORMAL HIGH (ref 70–99)
Potassium: 4.9 mEq/L (ref 3.5–5.1)
SODIUM: 138 meq/L (ref 135–145)

## 2016-11-21 LAB — HEPATIC FUNCTION PANEL
ALK PHOS: 68 U/L (ref 39–117)
ALT: 21 U/L (ref 0–35)
AST: 15 U/L (ref 0–37)
Albumin: 4.1 g/dL (ref 3.5–5.2)
BILIRUBIN DIRECT: 0 mg/dL (ref 0.0–0.3)
Total Bilirubin: 0.4 mg/dL (ref 0.2–1.2)
Total Protein: 7.1 g/dL (ref 6.0–8.3)

## 2016-11-21 LAB — TSH: TSH: 1.28 u[IU]/mL (ref 0.35–4.50)

## 2016-11-24 ENCOUNTER — Telehealth: Payer: Self-pay | Admitting: Podiatry

## 2016-11-24 NOTE — Telephone Encounter (Signed)
Called pt to schedule an appt to pick up orthotics and vm is full

## 2016-11-27 ENCOUNTER — Other Ambulatory Visit: Payer: Self-pay | Admitting: Internal Medicine

## 2016-11-28 NOTE — Telephone Encounter (Signed)
Called again and vm is full

## 2016-11-29 NOTE — Telephone Encounter (Signed)
TY

## 2016-12-15 ENCOUNTER — Other Ambulatory Visit: Payer: Self-pay | Admitting: Internal Medicine

## 2016-12-19 NOTE — Telephone Encounter (Signed)
Called refill into [facility]...Caroline Wong

## 2016-12-26 ENCOUNTER — Ambulatory Visit (INDEPENDENT_AMBULATORY_CARE_PROVIDER_SITE_OTHER): Payer: Federal, State, Local not specified - PPO | Admitting: Orthotics

## 2016-12-26 DIAGNOSIS — M722 Plantar fascial fibromatosis: Secondary | ICD-10-CM

## 2016-12-26 NOTE — Progress Notes (Signed)
Patient came in today to pick up custom made foot orthotics.  The goals were accomplished and the patient reported no dissatisfaction with said orthotics.  Patient was advised of breakin period and how to report any issues. 

## 2017-01-09 ENCOUNTER — Ambulatory Visit (INDEPENDENT_AMBULATORY_CARE_PROVIDER_SITE_OTHER): Payer: Federal, State, Local not specified - PPO

## 2017-01-09 ENCOUNTER — Encounter (HOSPITAL_COMMUNITY): Payer: Self-pay | Admitting: *Deleted

## 2017-01-09 ENCOUNTER — Emergency Department (HOSPITAL_COMMUNITY)
Admission: EM | Admit: 2017-01-09 | Discharge: 2017-01-09 | Disposition: A | Discharge status: Home / Self Care | Payer: Federal, State, Local not specified - PPO | Attending: Emergency Medicine | Admitting: Emergency Medicine

## 2017-01-09 ENCOUNTER — Encounter: Payer: Self-pay | Admitting: Podiatry

## 2017-01-09 ENCOUNTER — Ambulatory Visit (INDEPENDENT_AMBULATORY_CARE_PROVIDER_SITE_OTHER): Payer: Self-pay | Admitting: Podiatry

## 2017-01-09 ENCOUNTER — Other Ambulatory Visit: Payer: Self-pay | Admitting: Podiatry

## 2017-01-09 DIAGNOSIS — M79676 Pain in unspecified toe(s): Secondary | ICD-10-CM

## 2017-01-09 DIAGNOSIS — M722 Plantar fascial fibromatosis: Secondary | ICD-10-CM

## 2017-01-09 DIAGNOSIS — M79673 Pain in unspecified foot: Secondary | ICD-10-CM | POA: Diagnosis present

## 2017-01-09 DIAGNOSIS — M79671 Pain in right foot: Secondary | ICD-10-CM

## 2017-01-09 DIAGNOSIS — Z79899 Other long term (current) drug therapy: Secondary | ICD-10-CM | POA: Diagnosis not present

## 2017-01-09 DIAGNOSIS — M79672 Pain in left foot: Principal | ICD-10-CM

## 2017-01-09 DIAGNOSIS — I10 Essential (primary) hypertension: Secondary | ICD-10-CM | POA: Insufficient documentation

## 2017-01-09 LAB — CBG MONITORING, ED: Glucose-Capillary: 113 mg/dL — ABNORMAL HIGH (ref 65–99)

## 2017-01-09 MED ORDER — PREDNISONE 10 MG (21) PO TBPK: ORAL_TABLET | ORAL | 0 refills | Status: DC

## 2017-01-09 MED ORDER — PREDNISONE 50 MG PO TABS
60.0000 mg | ORAL_TABLET | Freq: Once | ORAL | Status: AC
  Administered 2017-01-09: 60 mg via ORAL
  Filled 2017-01-09: qty 1

## 2017-01-09 MED ORDER — KETOROLAC TROMETHAMINE 30 MG/ML IJ SOLN
30.0000 mg | Freq: Once | INTRAMUSCULAR | Status: AC
  Administered 2017-01-09: 30 mg via INTRAMUSCULAR
  Filled 2017-01-09: qty 1

## 2017-01-09 NOTE — ED Provider Notes (Signed)
[facility] DEPT Provider Note   CSN: [id number] Arrival date & time: 01/09/17  0609     History   Chief Complaint Chief Complaint  Patient presents with  . Foot Pain    HPI Caroline Wong is a 56 y.o. female.  Pt presents to the ED today with bilateral foot pain for 2 months.  She has seen her foot doctor in June who gave her injections.  She was diagnosed with plantar fasciitis (on chart, but she does has not heard this dx).  It helped the pain initially, but then the pain worsened.  The pt has not been able to sleep for 2 nights due to the pain.  She takes Tramadol for pain which has not been helping.  Pain worsens throughout the day.  The pt denies any other sx.  No new trauma.      Past Medical History:  Diagnosis Date  . Abdominal pain 11/25/2013   Has some nausea associated with pain that has been on and off x 2 weeks will get [place]  . Anxiety    Takes Lorazepam  . Chronic bronchitis ([place])   . Gallstones   . GERD (gastroesophageal reflux disease)   . Hormone replacement therapy (HRT)   . Hypertension   . Night sweats 04/14/2014  . Obesity 05/07/2014  . Urge incontinence 08/19/2015  . Yeast infection 05/07/2014    Patient Active Problem List   Diagnosis Date Noted  . Anxiety 11/14/2016  . UTI (urinary tract infection) 11/11/2015  . Diarrhea 11/11/2015  . GERD (gastroesophageal reflux disease) 10/23/2015  . BP check 09/24/2015  . Urge incontinence 08/19/2015  . Stye external 07/15/2015  . S/P cervical spinal fusion 06/05/2015  . Fatigue 05/13/2015  . Edema 04/01/2015  . Chest pain 04/01/2015  . Nonspecific abnormal electrocardiogram (ECG) (EKG) 04/01/2015  . 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 Neck pain, bilateral posterior 12/31/2014  . Lower back pain 12/31/2014  . Yeast infection 05/07/2014  . Obesity 05/07/2014  . Night sweats 04/14/2014  . Abdominal pain 11/25/2013  . Hormone replacement therapy (HRT) 08/21/2013  . Well adult exam  05/16/2013  . Hypertension     Past Surgical History:  Procedure Laterality Date  . ANTERIOR CERVICAL DECOMP/DISCECTOMY FUSION N/A 06/05/2015   Procedure: C5-6, C6-7 Anterior Cervical Discectomy and Fusion, Allograft, Plate;  Surgeon: [name], MD;  Location: [facility];  Service: Orthopedics;  Laterality: N/A;  . ANTERIOR FUSION CERVICAL SPINE  06/05/2015   C 5 6 7    . CHOLECYSTECTOMY    . COLONOSCOPY    . FOOT SURGERY Left   . MOLE REMOVAL    . WISDOM TOOTH EXTRACTION      OB History    Gravida Para Term Preterm AB Living   2 2 2     2    SAB TAB Ectopic Multiple Live Births           2       Home Medications    Prior to Admission medications   Medication Sig Start Date End Date Taking? Authorizing Provider  amLODipine (NORVASC) 5 MG tablet Take 1 tablet (5 mg total) by mouth daily. 11/18/16   Plotnikov, [name], MD  bifidobacterium infantis (ALIGN) capsule Take 1 capsule by mouth daily. 08/23/16   Estill Dooms, NP  Cholecalciferol (VITAMIN D3) 5000 units TABS Take by mouth. Takes 2 daily    [provider]  cyclobenzaprine (FLEXERIL) 10 MG tablet Take 1 tablet (10 mg total) by  mouth 3 (three) times daily as needed for muscle spasms. 12/25/15   Gatha Mayer, MD  diphenhydrAMINE (BENADRYL) 25 MG tablet Take 2 tablets (50 mg total) by mouth every 4 (four) hours as needed (bee sting). 02/24/15   Plotnikov, [name], MD  diphenoxylate-atropine (LOMOTIL) 2.5-0.025 MG tablet take 1 tablet by mouth four times a day if needed 09/12/16   Plotnikov, [name], MD  LORazepam (ATIVAN) 0.5 MG tablet Take 1 tablet (0.5 mg total) by mouth at bedtime. 11/14/16   Plotnikov, [name], MD  metFORMIN (GLUCOPHAGE) 500 MG tablet Take 1,000 mg by mouth. Takes 2 at supper    [provider]  metoprolol succinate (TOPROL-XL) 50 MG 24 hr tablet Take 1 tablet (50 mg total) by mouth daily. 11/28/16   Plotnikov, [name], MD  olmesartan-hydrochlorothiazide (BENICAR HCT) 40-12.5 MG  tablet take 1 tablet by mouth once daily 09/27/16   Plotnikov, [name], MD  phentermine (ADIPEX-P) 37.5 MG tablet take 1 tablet by mouth every morning 12/18/16   Plotnikov, [name], MD  predniSONE (STERAPRED UNI-PAK 21 TAB) 10 MG (21) TBPK tablet Take 6 tabs by mouth daily  for 2 days, then 5 tabs for 2 days, then 4 tabs for 2 days, then 3 tabs for 2 days, 2 tabs for 2 days, then 1 tab by mouth daily for 2 days 01/09/17   Isla Pence, MD  traMADol Veatrice Bourbon) 50 MG tablet take 1 tablet by mouth every 6 hours if needed 08/31/16   Plotnikov, [name], MD  valACYclovir (VALTREX) 1000 MG tablet take 1 tablet by mouth once daily 09/21/15   Estill Dooms, NP    Family History Family History  Problem Relation Age of Onset  . Breast cancer Mother        breast  . Diabetes Mother   . Fibromyalgia Mother   . Heart disease Mother        CHF  . Hypertension Mother   . Kidney disease Mother   . Heart disease Father   . Emphysema Father   . Diabetes Brother   . Hypertension Brother        x 2  . Heart disease Maternal Grandmother   . Diabetes Maternal Grandmother   . Asthma Maternal Grandmother   . Arthritis Maternal Grandmother   . Heart disease Maternal Grandfather   . Arthritis Maternal Grandfather   . Cancer Maternal Grandfather        bladder  . Breast cancer Paternal Grandmother   . Heart disease Paternal Grandmother   . Arthritis Paternal Grandmother   . Stroke Paternal Grandmother   . Diabetes Paternal Grandfather   . Heart disease Paternal Grandfather   . Colon cancer Paternal Grandfather        mets  . Arthritis Paternal Grandfather   . Stomach cancer Paternal Grandfather        mets to stomach  . Hypertension Brother   . Esophageal cancer Neg Hx   . Pancreatic cancer Neg Hx   . Rectal cancer Neg Hx     Social History Social History  Substance Use Topics  . Smoking status: Never Smoker  . Smokeless tobacco: Never Used  . Alcohol use 0.0 oz/week     Comment: red  wine 3 times weekly     Allergies   Bee venom; Biaxin [clarithromycin]; Betadine [povidone iodine]; Chloraprep one step [chlorhexidine gluconate]; Duraprep [antiseptic products, misc.]; and Keflex [cephalexin]   Review of Systems Review of Systems  Musculoskeletal:  Bilateral foot pain  All other systems reviewed and are negative.    Physical Exam Updated Vital Signs BP (!) 183/112 (BP Location: Right Arm)   Pulse 76   Temp 97.9 F (36.6 C) (Oral)   Resp 18   LMP 11/19/2016   SpO2 100%   Physical Exam  Constitutional: She is oriented to person, place, and time. She appears well-developed and well-nourished.  HENT:  Head: Normocephalic and atraumatic.  Right Ear: External ear normal.  Left Ear: External ear normal.  Nose: Nose normal.  Mouth/Throat: Oropharynx is clear and moist.  Eyes: Pupils are equal, round, and reactive to light. Conjunctivae and EOM are normal.  Neck: Normal range of motion. Neck supple.  Cardiovascular: Normal rate, regular rhythm, normal heart sounds and intact distal pulses.   Pulmonary/Chest: Effort normal and breath sounds normal.  Abdominal: Soft. Bowel sounds are normal.  Musculoskeletal:  Bilateral plantar fascia tenderness.  Neurological: She is alert and oriented to person, place, and time.  Skin: Skin is warm.  Psychiatric: She has a normal mood and affect. Her behavior is normal. Judgment and thought content normal.  Nursing note and vitals reviewed.    ED Treatments / Results  Labs (all labs ordered are listed, but only abnormal results are displayed) Labs Reviewed  CBG MONITORING, ED - Abnormal; Notable for the following:       Result Value   Glucose-Capillary 113 (*)    All other components within normal limits    EKG  EKG Interpretation None       Radiology No results found.  Procedures Procedures (including critical care time)  Medications Ordered in ED Medications  ketorolac (TORADOL) 30 MG/ML  injection 30 mg (not administered)  predniSONE (DELTASONE) tablet 60 mg (not administered)     Initial Impression / Assessment and Plan / ED Course  I have reviewed the triage vital signs and the nursing notes.  Pertinent labs & imaging results that were available during my care of the patient were reviewed by me and considered in my medical decision making (see chart for details).     I told pt we could not give her any narcotics.  She said she did not want any.   She will be given toradol IM here and started on a prednisone taper.    Pt has not done any physical therapy for her feet.  She is given exercises to do to help her feet.  She is encouraged to f/u with Dr. Paulla Dolly.  She knows to return if worse.  Final Clinical Impressions(s) / ED Diagnoses   Final diagnoses:  Plantar fasciitis    New Prescriptions New Prescriptions   PREDNISONE (STERAPRED UNI-PAK 21 TAB) 10 MG (21) TBPK TABLET    Take 6 tabs by mouth daily  for 2 days, then 5 tabs for 2 days, then 4 tabs for 2 days, then 3 tabs for 2 days, 2 tabs for 2 days, then 1 tab by mouth daily for 2 days     Isla Pence, MD 01/09/17 [phone]

## 2017-01-09 NOTE — ED Notes (Signed)
Pt states bilateral foot pain for the past month, was fitted for orthodics 3 weeks ago. Pt trying to get back in w/ here MD.

## 2017-01-09 NOTE — ED Triage Notes (Signed)
Pt c/o bilateral foot pain x 1 month; pt states she is waiting to get in to see her foot doctor

## 2017-01-11 NOTE — Progress Notes (Signed)
Subjective:    Patient ID: Caroline Wong, female   DOB: 56 y.o.   MRN: [id number]   HPI patient presents stating that she has developed discomfort in both her heels arches of an acute nature over the last few days and has been to the ER and they wrote her prescription for steroids that she has not started    ROS      Objective:  Physical Exam neurovascular status intact with patient noted to have discomfort in the heel arch is bilateral but no increased edema or other inflammatory processes noted of a systemic nature     Assessment:    Appears to be more inflammatory in nature and may be related to some systemic conditions even though no obvious manifestations     Plan:    H&P condition reviewed and at this point we will start on the Sterapred dose pack at the patient was prescribed. If symptoms do not settle down in the next 5-7 days she will be seen back

## 2017-01-23 ENCOUNTER — Other Ambulatory Visit: Payer: Self-pay | Admitting: Obstetrics & Gynecology

## 2017-01-30 ENCOUNTER — Encounter: Payer: Self-pay | Admitting: Podiatry

## 2017-01-30 ENCOUNTER — Ambulatory Visit (INDEPENDENT_AMBULATORY_CARE_PROVIDER_SITE_OTHER): Payer: Federal, State, Local not specified - PPO | Admitting: Podiatry

## 2017-01-30 DIAGNOSIS — M722 Plantar fascial fibromatosis: Secondary | ICD-10-CM | POA: Diagnosis not present

## 2017-01-30 DIAGNOSIS — M79672 Pain in left foot: Secondary | ICD-10-CM | POA: Diagnosis not present

## 2017-01-30 DIAGNOSIS — M79671 Pain in right foot: Secondary | ICD-10-CM | POA: Diagnosis not present

## 2017-01-30 MED ORDER — DICLOFENAC SODIUM 75 MG PO TBEC: 75.0000 mg | DELAYED_RELEASE_TABLET | Freq: Two times a day (BID) | ORAL | 2 refills | Status: DC

## 2017-01-31 DIAGNOSIS — L680 Hirsutism: Secondary | ICD-10-CM | POA: Diagnosis not present

## 2017-01-31 DIAGNOSIS — I10 Essential (primary) hypertension: Secondary | ICD-10-CM | POA: Diagnosis not present

## 2017-01-31 DIAGNOSIS — E669 Obesity, unspecified: Secondary | ICD-10-CM | POA: Diagnosis not present

## 2017-01-31 DIAGNOSIS — Z8349 Family history of other endocrine, nutritional and metabolic diseases: Secondary | ICD-10-CM | POA: Diagnosis not present

## 2017-01-31 NOTE — Progress Notes (Signed)
Subjective:    Patient ID: Caroline Wong, female   DOB: 56 y.o.   MRN: [id number]   HPI patient states her feet feel some better but she still has pain if she's been on them for a couple hours    ROS      Objective:  Physical Exam neurovascular status intact muscle strength adequate range of motion within normal limits with patient feet improving but still moderately tender when pressed     Assessment:   Tendinitis noted but improved      Plan:   Reviewed condition and at this point were in a try to continue oral anti-inflammatories supportive shoes orthotics and they may need to be modified if symptoms persist. Reappoint to recheck

## 2017-02-12 IMAGING — CR DG CHEST 2V
2 series · 2 of 2 positions shown · non-contrast
Comparison: None.

CLINICAL DATA: Cervical spondylosis.

EXAM:
CHEST  2 VIEW

[w chest lat]
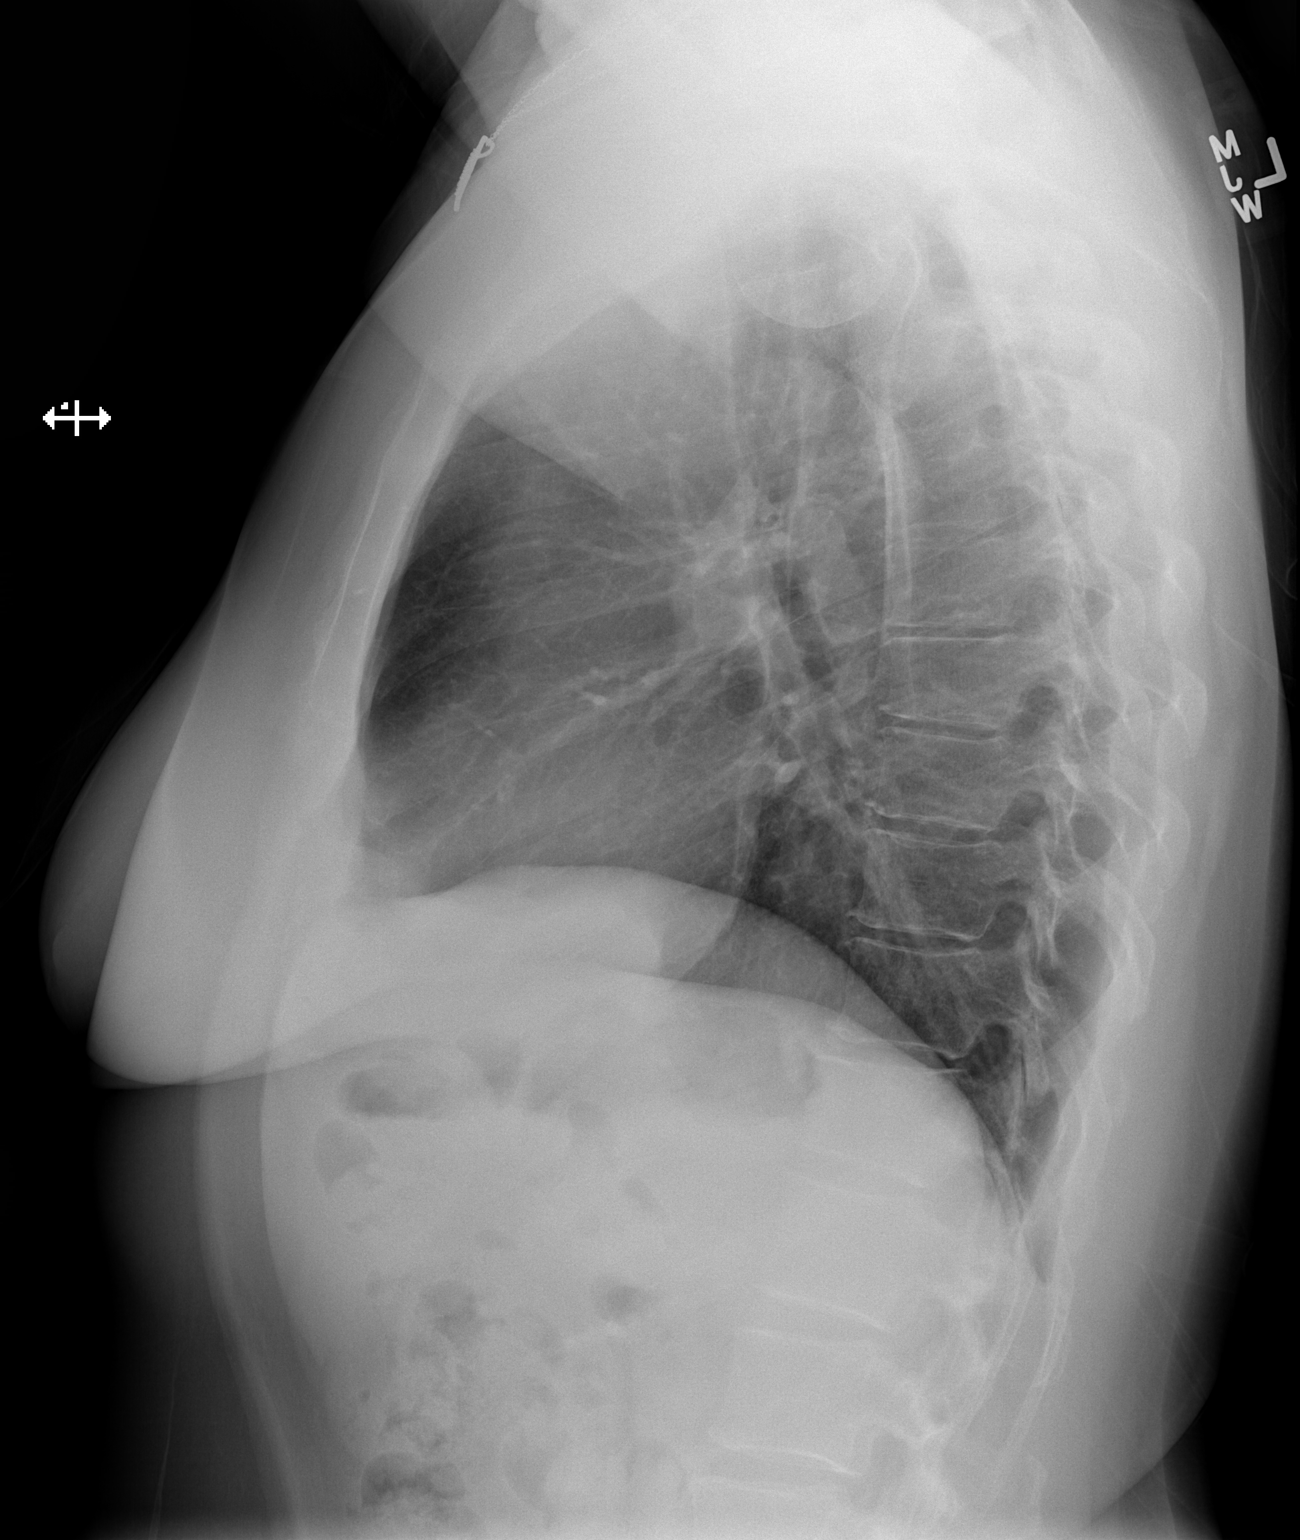

[w chest pa]
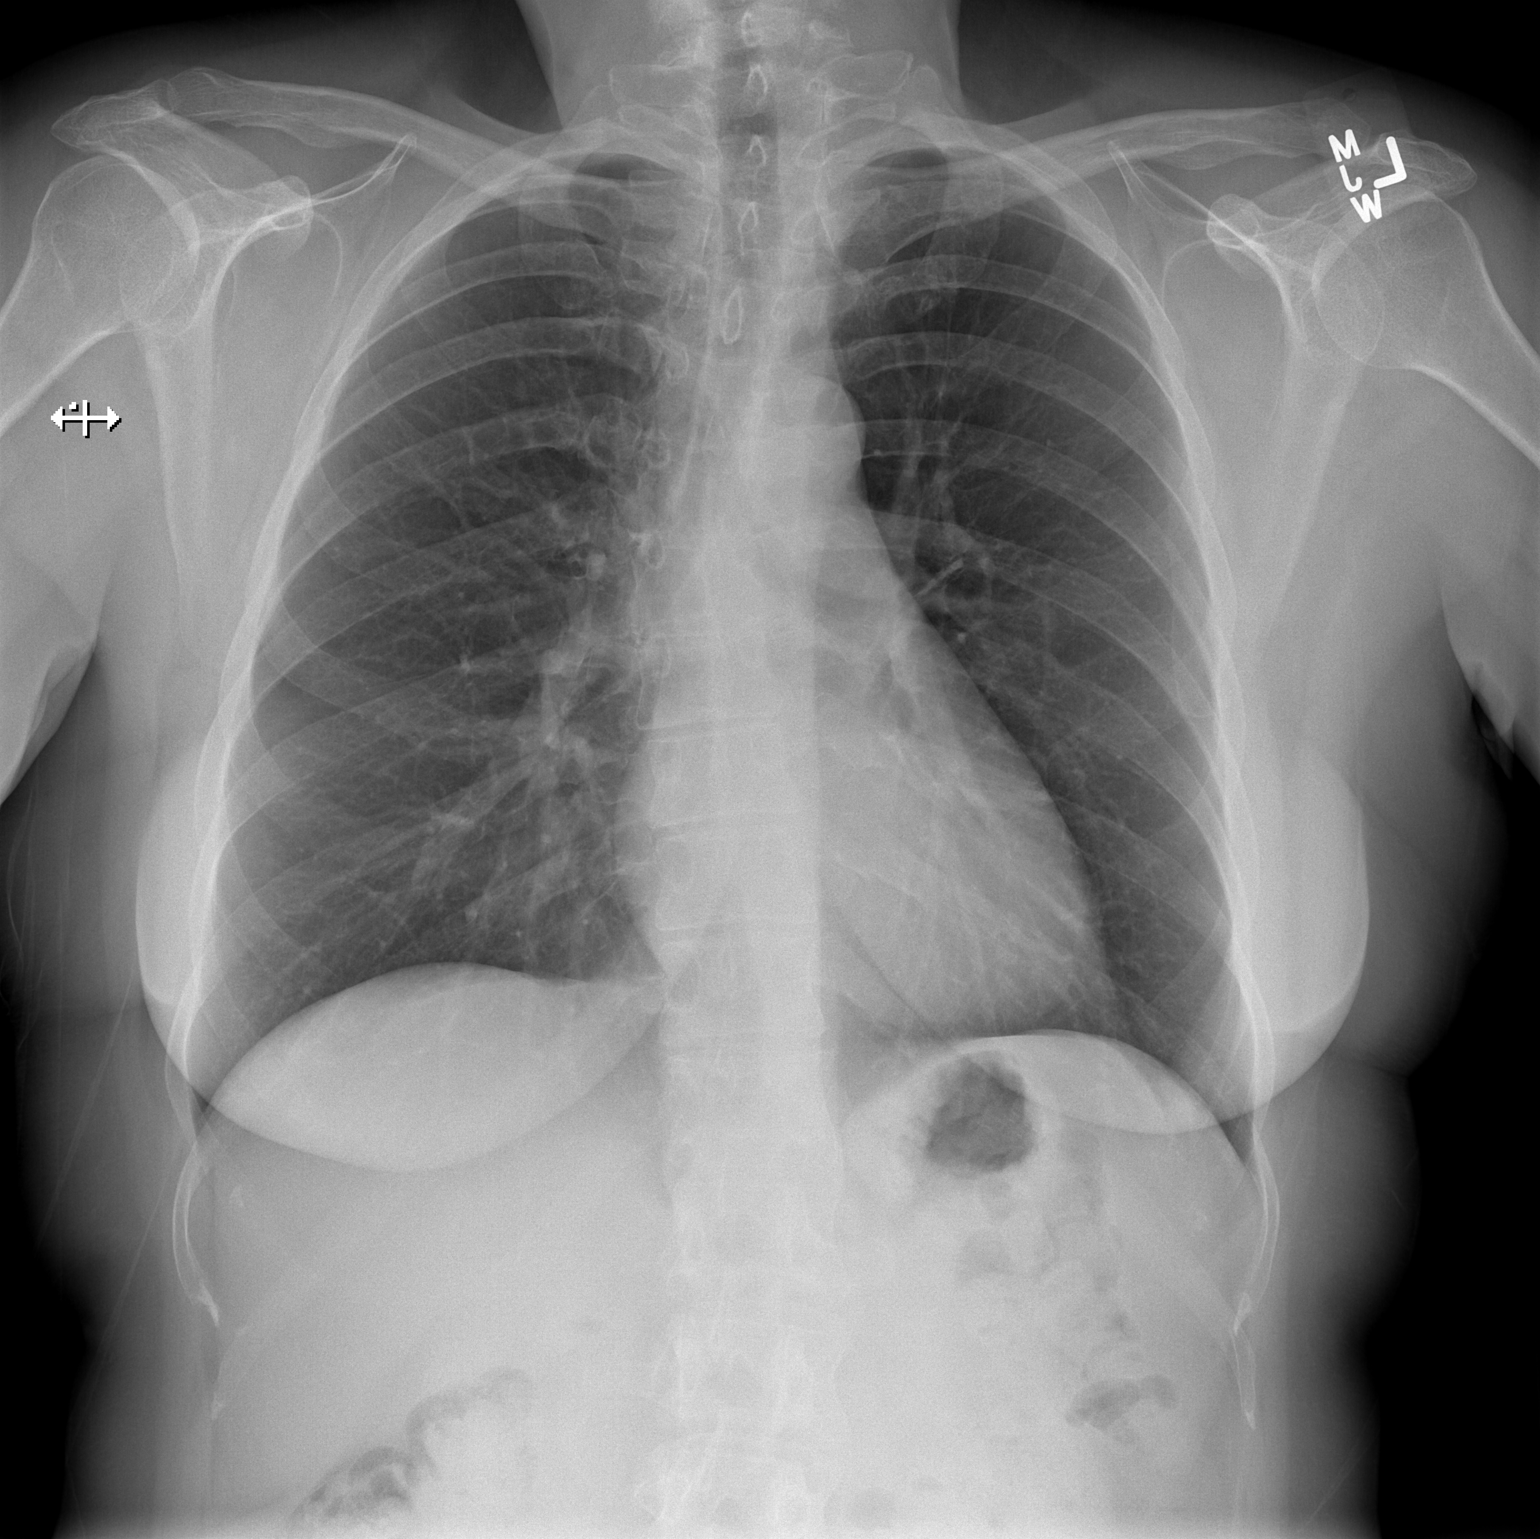

[2 of 2 positions shown; findings below may reference images not displayed]

FINDINGS: The heart size and mediastinal contours are within normal limits.
Both lungs are clear. The visualized skeletal structures are
unremarkable.
IMPRESSION: No active cardiopulmonary disease.

## 2017-02-27 ENCOUNTER — Ambulatory Visit (INDEPENDENT_AMBULATORY_CARE_PROVIDER_SITE_OTHER): Payer: Federal, State, Local not specified - PPO | Admitting: Podiatry

## 2017-02-27 ENCOUNTER — Encounter: Payer: Self-pay | Admitting: Podiatry

## 2017-02-27 DIAGNOSIS — M722 Plantar fascial fibromatosis: Secondary | ICD-10-CM

## 2017-02-28 NOTE — Progress Notes (Signed)
Subjective:    Patient ID: Caroline Wong, female   DOB: 56 y.o.   MRN: [id number]   HPI patient states I'm doing fairly well but no she needs new orthotics    ROS      Objective:  Physical Exam neurovascular status intact with patient's arch improving from previous but still painful upon palpation     Assessment:  Fasciitis-like symptoms still present but improved       Plan:   Advised on physical therapy anti-inflammatories and supportive shoe gear. Patient will be seen back to recheck and at this time is scanned for a new customized orthotic to lift the arch and will have the second pair rehabilitation when these are returned

## 2017-03-21 ENCOUNTER — Ambulatory Visit: Payer: Federal, State, Local not specified - PPO | Admitting: Orthotics

## 2017-03-21 DIAGNOSIS — M79672 Pain in left foot: Principal | ICD-10-CM

## 2017-03-21 DIAGNOSIS — M79671 Pain in right foot: Secondary | ICD-10-CM

## 2017-03-21 DIAGNOSIS — M722 Plantar fascial fibromatosis: Secondary | ICD-10-CM

## 2017-03-21 NOTE — Progress Notes (Signed)
Patient came in today to pick up custom made foot orthotics.  The goals were accomplished and the patient reported no dissatisfaction with said orthotics.  Patient was advised of breakin period and how to report any issues. 

## 2017-04-05 ENCOUNTER — Telehealth: Payer: Self-pay | Admitting: Podiatry

## 2017-04-05 NOTE — Telephone Encounter (Signed)
Called pt to  Let her know her refurbished orthotics are in but mailbox is full.

## 2017-05-03 DIAGNOSIS — R635 Abnormal weight gain: Secondary | ICD-10-CM | POA: Diagnosis not present

## 2017-05-03 DIAGNOSIS — N951 Menopausal and female climacteric states: Secondary | ICD-10-CM | POA: Diagnosis not present

## 2017-05-10 DIAGNOSIS — R232 Flushing: Secondary | ICD-10-CM | POA: Diagnosis not present

## 2017-05-10 DIAGNOSIS — R7303 Prediabetes: Secondary | ICD-10-CM | POA: Diagnosis not present

## 2017-05-10 DIAGNOSIS — E6609 Other obesity due to excess calories: Secondary | ICD-10-CM | POA: Diagnosis not present

## 2017-05-10 DIAGNOSIS — I10 Essential (primary) hypertension: Secondary | ICD-10-CM | POA: Diagnosis not present

## 2017-05-15 ENCOUNTER — Ambulatory Visit: Payer: Federal, State, Local not specified - PPO | Admitting: Internal Medicine

## 2017-05-17 DIAGNOSIS — R7303 Prediabetes: Secondary | ICD-10-CM | POA: Diagnosis not present

## 2017-05-17 DIAGNOSIS — I10 Essential (primary) hypertension: Secondary | ICD-10-CM | POA: Diagnosis not present

## 2017-05-17 DIAGNOSIS — E6609 Other obesity due to excess calories: Secondary | ICD-10-CM | POA: Diagnosis not present

## 2017-05-18 ENCOUNTER — Other Ambulatory Visit: Payer: Self-pay | Admitting: Internal Medicine

## 2017-05-20 NOTE — Telephone Encounter (Signed)
Done erx 

## 2017-05-20 NOTE — Telephone Encounter (Signed)
Please advise about refill in Dr. Plotnikovs absence. 

## 2017-06-05 ENCOUNTER — Encounter: Payer: Self-pay | Admitting: Internal Medicine

## 2017-06-08 ENCOUNTER — Encounter: Payer: Self-pay | Admitting: Internal Medicine

## 2017-06-08 ENCOUNTER — Ambulatory Visit: Payer: Federal, State, Local not specified - PPO | Admitting: Internal Medicine

## 2017-06-08 DIAGNOSIS — J0100 Acute maxillary sinusitis, unspecified: Secondary | ICD-10-CM

## 2017-06-08 MED ORDER — AZITHROMYCIN 250 MG PO TABS: ORAL_TABLET | ORAL | 0 refills | Status: DC

## 2017-06-08 NOTE — Assessment & Plan Note (Signed)
z pac 

## 2017-06-08 NOTE — Patient Instructions (Signed)
You can use over-the-counter  "cold" medicines  such as "Tylenol cold" , "Advil cold",  "Mucinex" or" Mucinex D"  for cough and congestion.   Avoid decongestants if you have high blood pressure and use "Afrin" nasal spray for nasal congestion as directed. Use " Delsym" or" Robitussin" cough syrup varietis for cough.  You can use plain "Tylenol" or "Advil" for fever, chills and achyness. Use Halls or Ricola cough drops.   Please, make an appointment if you are not better or if you're worse.  

## 2017-06-08 NOTE — Progress Notes (Signed)
Subjective:  Patient ID: Caroline Wong, female    DOB: [birth year]  Age: 57 y.o. MRN: [id number]  CC: No chief complaint on file.   HPI Caroline Wong presents for URI sx's x 5 d - worse C/o sinus pain and R ear pain  Outpatient Medications Prior to Visit  Medication Sig Dispense Refill  . amLODipine (NORVASC) 5 MG tablet Take 1 tablet (5 mg total) by mouth daily. 90 tablet 3  . bifidobacterium infantis (ALIGN) capsule Take 1 capsule by mouth daily.    . Cholecalciferol (VITAMIN D3) 5000 units TABS Take by mouth. Takes 2 daily    . cyclobenzaprine (FLEXERIL) 10 MG tablet Take 1 tablet (10 mg total) by mouth 3 (three) times daily as needed for muscle spasms. 60 tablet 0  . diclofenac (VOLTAREN) 75 MG EC tablet Take 1 tablet (75 mg total) by mouth 2 (two) times daily. 50 tablet 2  . diphenhydrAMINE (BENADRYL) 25 MG tablet Take 2 tablets (50 mg total) by mouth every 4 (four) hours as needed (bee sting). 30 tablet 3  . diphenoxylate-atropine (LOMOTIL) 2.5-0.025 MG tablet take 1 tablet by mouth four times a day if needed 60 tablet 0  . LORazepam (ATIVAN) 0.5 MG tablet take 1 tablet by mouth at bedtime if needed 30 tablet 5  . metFORMIN (GLUCOPHAGE) 500 MG tablet Take 1,000 mg by mouth. Takes 2 at supper    . metoprolol succinate (TOPROL-XL) 50 MG 24 hr tablet Take 1 tablet (50 mg total) by mouth daily. 30 tablet 6  . olmesartan-hydrochlorothiazide (BENICAR HCT) 40-12.5 MG tablet take 1 tablet by mouth once daily 30 tablet 11  . phentermine (ADIPEX-P) 37.5 MG tablet take 1 tablet every morning 30 tablet 2  . traMADol (ULTRAM) 50 MG tablet take 1 tablet by mouth every 6 hours if needed 60 tablet 1  . valACYclovir (VALTREX) 1000 MG tablet take 1 tablet by mouth once daily 30 tablet PRN   Facility-Administered Medications Prior to Visit  Medication Dose Route Frequency Provider Last Rate Last Dose  . aminophylline injection 75 mg  75 mg Intravenous BID PRN Dorothy Spark, MD   75 mg at  04/13/15 1037    ROS Review of Systems  Constitutional: Negative for activity change, appetite change, chills, fatigue and unexpected weight change.  HENT: Positive for congestion, ear pain, sinus pressure and sinus pain. Negative for mouth sores.   Eyes: Negative for visual disturbance.  Respiratory: Negative for cough and chest tightness.   Gastrointestinal: Negative for abdominal pain and nausea.  Genitourinary: Negative for difficulty urinating, frequency and vaginal pain.  Musculoskeletal: Negative for back pain and gait problem.  Skin: Negative for pallor and rash.  Neurological: Negative for dizziness, tremors, weakness, numbness and headaches.  Psychiatric/Behavioral: Negative for confusion and sleep disturbance.    Objective:  BP 124/86 (BP Location: Left Arm, Patient Position: Sitting, Cuff Size: Large)   Pulse 60   Temp 98.7 F (37.1 C) (Oral)   Ht 5' 7.5" (1.715 m)   Wt 224 lb (101.6 kg)   SpO2 99%   BMI 34.57 kg/m   BP Readings from Last 3 Encounters:  06/08/17 124/86  01/09/17 (!) 183/112  11/14/16 126/82    Wt Readings from Last 3 Encounters:  06/08/17 224 lb (101.6 kg)  11/14/16 221 lb (100.2 kg)  08/23/16 224 lb (101.6 kg)    Physical Exam  Constitutional: She appears well-developed. No distress.  HENT:  Head: Normocephalic.  Right Ear: External ear  normal.  Left Ear: External ear normal.  Nose: Nose normal.  Mouth/Throat: Oropharynx is clear and moist.  Eyes: Conjunctivae are normal. Pupils are equal, round, and reactive to light. Right eye exhibits no discharge. Left eye exhibits no discharge.  Neck: Normal range of motion. Neck supple. No JVD present. No tracheal deviation present. No thyromegaly present.  Cardiovascular: Normal rate, regular rhythm and normal heart sounds.  Pulmonary/Chest: No stridor. No respiratory distress. She has no wheezes.  Abdominal: Soft. Bowel sounds are normal. She exhibits no distension and no mass. There is no  tenderness. There is no rebound and no guarding.  Musculoskeletal: She exhibits no edema or tenderness.  Lymphadenopathy:    She has no cervical adenopathy.  Neurological: She displays normal reflexes. No cranial nerve deficit. She exhibits normal muscle tone. Coordination normal.  Skin: No rash noted. No erythema.  Psychiatric: She has a normal mood and affect. Her behavior is normal. Judgment and thought content normal.  eryth nasal passages  Lab Results  Component Value Date   WBC 9.4 11/21/2016   HGB 13.9 11/21/2016   HCT 41.2 11/21/2016   PLT 281.0 11/21/2016   GLUCOSE 103 (H) 11/21/2016   CHOL 139 11/21/2016   TRIG 68.0 11/21/2016   HDL 49.10 11/21/2016   LDLCALC 77 11/21/2016   ALT 21 11/21/2016   AST 15 11/21/2016   NA 138 11/21/2016   K 4.9 11/21/2016   CL 103 11/21/2016   CREATININE 1.06 11/21/2016   BUN 30 (H) 11/21/2016   CO2 29 11/21/2016   TSH 1.28 11/21/2016   INR 1.02 05/27/2015    Dg Foot 2 Views Left  Result Date: 01/09/2017 Please see detailed radiograph report in office note.  Dg Foot 2 Views Right  Result Date: 01/09/2017 Please see detailed radiograph report in office note.   Assessment & Plan:   There are no diagnoses linked to this encounter. I am having [name] maintain her diphenhydrAMINE, valACYclovir, cyclobenzaprine, Vitamin D3, metFORMIN, bifidobacterium infantis, traMADol, diphenoxylate-atropine, olmesartan-hydrochlorothiazide, amLODipine, metoprolol succinate, LORazepam, diclofenac, and phentermine.  No orders of the defined types were placed in this encounter.    Follow-up: No Follow-up on file.  Walker Kehr, MD

## 2017-06-10 ENCOUNTER — Encounter: Payer: Self-pay | Admitting: Internal Medicine

## 2017-06-11 ENCOUNTER — Encounter: Payer: Self-pay | Admitting: Internal Medicine

## 2017-06-12 ENCOUNTER — Encounter: Payer: Self-pay | Admitting: Internal Medicine

## 2017-06-12 ENCOUNTER — Ambulatory Visit: Payer: Federal, State, Local not specified - PPO | Admitting: Internal Medicine

## 2017-06-12 VITALS — BP 130/80 | HR 56 | Temp 98.0°F | Resp 16 | Ht 67.5 in | Wt 222.0 lb

## 2017-06-12 DIAGNOSIS — J069 Acute upper respiratory infection, unspecified: Secondary | ICD-10-CM | POA: Insufficient documentation

## 2017-06-12 DIAGNOSIS — R05 Cough: Secondary | ICD-10-CM

## 2017-06-12 DIAGNOSIS — R059 Cough, unspecified: Secondary | ICD-10-CM

## 2017-06-12 DIAGNOSIS — B9789 Other viral agents as the cause of diseases classified elsewhere: Secondary | ICD-10-CM

## 2017-06-12 LAB — POCT EXHALED NITRIC OXIDE: FENO LEVEL (PPB): 12

## 2017-06-12 MED ORDER — PROMETHAZINE-DM 6.25-15 MG/5ML PO SYRP: 5.0000 mL | ORAL_SOLUTION | Freq: Four times a day (QID) | ORAL | 0 refills | Status: DC | PRN

## 2017-06-12 NOTE — Patient Instructions (Signed)
Cough, Adult  Coughing is a reflex that clears your throat and your airways. Coughing helps to heal and protect your lungs. It is normal to cough occasionally, but a cough that happens with other symptoms or lasts a long time may be a sign of a condition that needs treatment. A cough may last only 2-3 weeks (acute), or it may last longer than 8 weeks (chronic).  What are the causes?  Coughing is commonly caused by:   Breathing in substances that irritate your lungs.   A viral or bacterial respiratory infection.   Allergies.   Asthma.   Postnasal drip.   Smoking.   Acid backing up from the stomach into the esophagus (gastroesophageal reflux).   Certain medicines.   Chronic lung problems, including COPD (or rarely, lung cancer).   Other medical conditions such as heart failure.    Follow these instructions at home:  Pay attention to any changes in your symptoms. Take these actions to help with your discomfort:   Take medicines only as told by your health care provider.  ? If you were prescribed an antibiotic medicine, take it as told by your health care provider. Do not stop taking the antibiotic even if you start to feel better.  ? Talk with your health care provider before you take a cough suppressant medicine.   Drink enough fluid to keep your urine clear or pale yellow.   If the air is dry, use a cold steam vaporizer or humidifier in your bedroom or your home to help loosen secretions.   Avoid anything that causes you to cough at work or at home.   If your cough is worse at night, try sleeping in a semi-upright position.   Avoid cigarette smoke. If you smoke, quit smoking. If you need help quitting, ask your health care provider.   Avoid caffeine.   Avoid alcohol.   Rest as needed.    Contact a health care provider if:   You have new symptoms.   You cough up pus.   Your cough does not get better after 2-3 weeks, or your cough gets worse.   You cannot control your cough with suppressant  medicines and you are losing sleep.   You develop pain that is getting worse or pain that is not controlled with pain medicines.   You have a fever.   You have unexplained weight loss.   You have night sweats.  Get help right away if:   You cough up blood.   You have difficulty breathing.   Your heartbeat is very fast.  This information is not intended to replace advice given to you by your health care provider. Make sure you discuss any questions you have with your health care provider.  Document Released: 11/19/2010 Document Revised: 10/29/2015 Document Reviewed: 07/30/2014  [employer]vier Interactive Patient Education  2018 [employer]

## 2017-06-12 NOTE — Progress Notes (Signed)
Subjective:  Patient ID: Caroline Wong, female    DOB: [birth year]  Age: 57 y.o. MRN: [id number]  CC: Cough   HPI Caroline Wong presents for a one-week history of nonproductive cough, laryngitis, and shortness of breath.  She was seen a week ago and took a Zithromax course which has not helped.  She denies wheezing, chest pain, hemoptysis, fever, or chills.  The cough keeps her awake at night and interferes with her ability to work.  Outpatient Medications Prior to Visit  Medication Sig Dispense Refill  . amLODipine (NORVASC) 5 MG tablet Take 1 tablet (5 mg total) by mouth daily. 90 tablet 3  . bifidobacterium infantis (ALIGN) capsule Take 1 capsule by mouth daily.    . Cholecalciferol (VITAMIN D3) 5000 units TABS Take by mouth. Takes 2 daily    . metFORMIN (GLUCOPHAGE) 500 MG tablet Take 1,000 mg by mouth. Takes 2 at supper    . metoprolol succinate (TOPROL-XL) 50 MG 24 hr tablet Take 1 tablet (50 mg total) by mouth daily. 30 tablet 6  . olmesartan-hydrochlorothiazide (BENICAR HCT) 40-12.5 MG tablet take 1 tablet by mouth once daily 30 tablet 11  . phentermine (ADIPEX-P) 37.5 MG tablet take 1 tablet every morning 30 tablet 2  . azithromycin (ZITHROMAX Z-PAK) 250 MG tablet As directed 6 each 0  . cyclobenzaprine (FLEXERIL) 10 MG tablet Take 1 tablet (10 mg total) by mouth 3 (three) times daily as needed for muscle spasms. (Patient not taking: Reported on 06/12/2017) 60 tablet 0  . diclofenac (VOLTAREN) 75 MG EC tablet Take 1 tablet (75 mg total) by mouth 2 (two) times daily. (Patient not taking: Reported on 06/12/2017) 50 tablet 2  . diphenhydrAMINE (BENADRYL) 25 MG tablet Take 2 tablets (50 mg total) by mouth every 4 (four) hours as needed (bee sting). (Patient not taking: Reported on 06/12/2017) 30 tablet 3  . diphenoxylate-atropine (LOMOTIL) 2.5-0.025 MG tablet take 1 tablet by mouth four times a day if needed (Patient not taking: Reported on 06/12/2017) 60 tablet 0  . LORazepam (ATIVAN) 0.5  MG tablet take 1 tablet by mouth at bedtime if needed (Patient not taking: Reported on 06/12/2017) 30 tablet 5  . traMADol (ULTRAM) 50 MG tablet take 1 tablet by mouth every 6 hours if needed (Patient not taking: Reported on 06/12/2017) 60 tablet 1  . valACYclovir (VALTREX) 1000 MG tablet take 1 tablet by mouth once daily (Patient not taking: Reported on 06/12/2017) 30 tablet PRN   Facility-Administered Medications Prior to Visit  Medication Dose Route Frequency Provider Last Rate Last Dose  . aminophylline injection 75 mg  75 mg Intravenous BID PRN Dorothy Spark, MD   75 mg at 04/13/15 1037    ROS Review of Systems  Constitutional: Negative for appetite change, chills, diaphoresis, fatigue and fever.  HENT: Positive for voice change. Negative for congestion, sinus pressure, sore throat and trouble swallowing.   Eyes: Negative.   Respiratory: Positive for cough and shortness of breath. Negative for chest tightness, wheezing and stridor.   Cardiovascular: Negative for chest pain, palpitations and leg swelling.  Gastrointestinal: Negative for abdominal pain, diarrhea, nausea and vomiting.  Endocrine: Negative.   Genitourinary: Negative.   Musculoskeletal: Negative.  Negative for back pain and myalgias.  Skin: Negative.   Allergic/Immunologic: Negative.   Neurological: Negative.   Hematological: Negative.  Negative for adenopathy. Does not bruise/bleed easily.  Psychiatric/Behavioral: Negative.     Objective:  BP 130/80 (BP Location: Left Arm, Patient Position: Sitting,  Cuff Size: Large)   Pulse (!) 56   Temp 98 F (36.7 C) (Oral)   Resp 16   Ht 5' 7.5" (1.715 m)   Wt 222 lb (100.7 kg)   SpO2 97%   BMI 34.26 kg/m   BP Readings from Last 3 Encounters:  06/12/17 130/80  06/08/17 124/86  01/09/17 (!) 183/112    Wt Readings from Last 3 Encounters:  06/12/17 222 lb (100.7 kg)  06/08/17 224 lb (101.6 kg)  11/14/16 221 lb (100.2 kg)    Physical Exam  Constitutional: She is  oriented to person, place, and time.  Non-toxic appearance. She does not have a sickly appearance. She does not appear ill. No distress.  HENT:  Mouth/Throat: Oropharynx is clear and moist. No oropharyngeal exudate.  Eyes: Left eye exhibits no discharge. No scleral icterus.  Neck: Normal range of motion. Neck supple. No JVD present. No thyromegaly present.  Cardiovascular: Normal rate, regular rhythm and normal heart sounds. Exam reveals no friction rub.  No murmur heard. Pulmonary/Chest: Effort normal and breath sounds normal. No respiratory distress. She has no wheezes. She has no rales.  Abdominal: Soft. Bowel sounds are normal.  Musculoskeletal: Normal range of motion. She exhibits no edema or tenderness.  Lymphadenopathy:    She has no cervical adenopathy.  Neurological: She is alert and oriented to person, place, and time.  Skin: Skin is warm and dry. No rash noted. She is not diaphoretic. No erythema. No pallor.  Vitals reviewed.   Lab Results  Component Value Date   WBC 9.4 11/21/2016   HGB 13.9 11/21/2016   HCT 41.2 11/21/2016   PLT 281.0 11/21/2016   GLUCOSE 103 (H) 11/21/2016   CHOL 139 11/21/2016   TRIG 68.0 11/21/2016   HDL 49.10 11/21/2016   LDLCALC 77 11/21/2016   ALT 21 11/21/2016   AST 15 11/21/2016   NA 138 11/21/2016   K 4.9 11/21/2016   CL 103 11/21/2016   CREATININE 1.06 11/21/2016   BUN 30 (H) 11/21/2016   CO2 29 11/21/2016   TSH 1.28 11/21/2016   INR 1.02 05/27/2015    Dg Foot 2 Views Left  Result Date: 01/09/2017 Please see detailed radiograph report in office note.  Dg Foot 2 Views Right  Result Date: 01/09/2017 Please see detailed radiograph report in office note.   Assessment & Plan:   Adrienne was seen today for cough.  Diagnoses and all orders for this visit:  Cough- Her FeNO score is not elevated so I do not think she is having a flareup of asthma and would not benefit from steroid therapy. -     POCT EXHALED NITRIC OXIDE  Viral URI  with cough- Will control the cough with Phenergan DM as needed. -     promethazine-dextromethorphan (PROMETHAZINE-DM) 6.25-15 MG/5ML syrup; Take 5 mLs by mouth 4 (four) times daily as needed for cough.   I have discontinued Tashonda Pinkus. [place]'s azithromycin. I am also having her start on promethazine-dextromethorphan. Additionally, I am having her maintain her diphenhydrAMINE, valACYclovir, cyclobenzaprine, Vitamin D3, metFORMIN, bifidobacterium infantis, traMADol, diphenoxylate-atropine, olmesartan-hydrochlorothiazide, amLODipine, metoprolol succinate, LORazepam, diclofenac, and phentermine.  Meds ordered this encounter  Medications  . promethazine-dextromethorphan (PROMETHAZINE-DM) 6.25-15 MG/5ML syrup    Sig: Take 5 mLs by mouth 4 (four) times daily as needed for cough.    Dispense:  118 mL    Refill:  0     Follow-up: Return in about 3 weeks (around 07/03/2017).  Scarlette Calico, MD

## 2017-06-14 ENCOUNTER — Other Ambulatory Visit: Payer: Self-pay | Admitting: Internal Medicine

## 2017-06-14 MED ORDER — LEVOFLOXACIN 500 MG PO TABS: 500.0000 mg | ORAL_TABLET | Freq: Every day | ORAL | 0 refills | Status: AC

## 2017-06-20 ENCOUNTER — Ambulatory Visit: Payer: Federal, State, Local not specified - PPO | Admitting: Internal Medicine

## 2017-06-20 ENCOUNTER — Other Ambulatory Visit: Payer: Self-pay | Admitting: Internal Medicine

## 2017-06-20 NOTE — Telephone Encounter (Signed)
Check [place] registry last filled 10/13/2016.Marland KitchenJohny Wong

## 2017-06-21 MED ORDER — TRAMADOL HCL 50 MG PO TABS: ORAL_TABLET | ORAL | 1 refills | Status: DC

## 2017-07-16 ENCOUNTER — Encounter: Payer: Self-pay | Admitting: Internal Medicine

## 2017-07-17 ENCOUNTER — Other Ambulatory Visit: Payer: Self-pay | Admitting: Internal Medicine

## 2017-07-17 MED ORDER — OLMESARTAN MEDOXOMIL 40 MG PO TABS: 40.0000 mg | ORAL_TABLET | Freq: Every day | ORAL | 11 refills | Status: DC

## 2017-07-17 MED ORDER — HYDROCHLOROTHIAZIDE 12.5 MG PO CAPS: 12.5000 mg | ORAL_CAPSULE | Freq: Every day | ORAL | 11 refills | Status: DC

## 2017-07-17 NOTE — Telephone Encounter (Signed)
Copied from [place] [id number]. Topic: Quick Communication - See Telephone Encounter >> Jul 17, 2017  4:11 PM Aurelio Brash B wrote: CRM for notification. See Telephone encounter for:  PT called because she has not had her blood pressure medication (Benicar) since last Friday. Her pharmacy is out and will not have any for at least a week, other pharmacies in the area are out too.    The pharmacist told the pt that if Dr would write a prescription for the 2 meds that make up benicar he could fill that, because they have that in stock.

## 2017-07-17 NOTE — Telephone Encounter (Signed)
Routing to CMA 

## 2017-07-28 ENCOUNTER — Encounter: Payer: Self-pay | Admitting: Internal Medicine

## 2017-08-02 ENCOUNTER — Other Ambulatory Visit: Payer: Self-pay | Admitting: Internal Medicine

## 2017-08-02 MED ORDER — LORAZEPAM 0.5 MG PO TABS: ORAL_TABLET | ORAL | 5 refills | Status: DC

## 2017-08-07 DIAGNOSIS — Z5181 Encounter for therapeutic drug level monitoring: Secondary | ICD-10-CM | POA: Diagnosis not present

## 2017-08-07 DIAGNOSIS — E669 Obesity, unspecified: Secondary | ICD-10-CM | POA: Diagnosis not present

## 2017-08-07 DIAGNOSIS — R7303 Prediabetes: Secondary | ICD-10-CM | POA: Diagnosis not present

## 2017-08-07 DIAGNOSIS — L680 Hirsutism: Secondary | ICD-10-CM | POA: Diagnosis not present

## 2017-08-07 DIAGNOSIS — I10 Essential (primary) hypertension: Secondary | ICD-10-CM | POA: Diagnosis not present

## 2017-08-18 ENCOUNTER — Telehealth: Payer: Self-pay

## 2017-08-18 NOTE — Telephone Encounter (Signed)
Key: [id number]

## 2017-08-21 ENCOUNTER — Encounter: Payer: Self-pay | Admitting: Adult Health

## 2017-08-21 DIAGNOSIS — Z1231 Encounter for screening mammogram for malignant neoplasm of breast: Secondary | ICD-10-CM | POA: Diagnosis not present

## 2017-08-22 ENCOUNTER — Ambulatory Visit: Payer: Federal, State, Local not specified - PPO | Admitting: Family Medicine

## 2017-08-22 ENCOUNTER — Encounter: Payer: Self-pay | Admitting: Family Medicine

## 2017-08-22 VITALS — BP 137/68 | HR 63 | Temp 97.7°F | Ht 67.75 in | Wt 221.0 lb

## 2017-08-22 DIAGNOSIS — J069 Acute upper respiratory infection, unspecified: Secondary | ICD-10-CM | POA: Diagnosis not present

## 2017-08-22 NOTE — Patient Instructions (Signed)
Please try things such as zyrtec-D or allegra-D which is an antihistamine and decongestant.   Please try afrin which will help with nasal congestion but use for only three days.   Please also try using a netti pot on a regular occasion.  Honey can help with a sore throat.   Vick's and delsym can help a cough.

## 2017-08-22 NOTE — Progress Notes (Signed)
Caroline Wong - 57 y.o. female MRN [id number]  Date of birth: 1960/10/06  SUBJECTIVE:  Including CC & ROS.  Chief Complaint  Patient presents with  . Sinus pressure    Caroline Wong is a 57 y.o. female that is presenting with sinus pressure and cough. Ongoing for for three days. Admits to productive cough, green mucous. Admits chills. She does not get the flu shot. Her husband had similar symptoms last week. She has been taking Nyquil, mucinex and tylenol cold with no improvement.    Review of Systems  Constitutional: Negative for fever.  HENT: Positive for congestion and sinus pressure.   Respiratory: Negative for cough.   Cardiovascular: Negative for chest pain.    HISTORY: Past Medical, Surgical, Social, and Family History Reviewed & Updated per EMR.   Pertinent Historical Findings include:  Past Medical History:  Diagnosis Date  . Abdominal pain 11/25/2013   Has some nausea associated with pain that has been on and off x 2 weeks will get [place]  . Anxiety    Takes Lorazepam  . Chronic bronchitis ([place])   . Gallstones   . GERD (gastroesophageal reflux disease)   . Hormone replacement therapy (HRT)   . Hypertension   . Night sweats 04/14/2014  . Obesity 05/07/2014  . Urge incontinence 08/19/2015  . Yeast infection 05/07/2014    Past Surgical History:  Procedure Laterality Date  . ANTERIOR CERVICAL DECOMP/DISCECTOMY FUSION N/A 06/05/2015   Procedure: C5-6, C6-7 Anterior Cervical Discectomy and Fusion, Allograft, Plate;  Surgeon: Caroline Killings, MD;  Location: [facility];  Service: Orthopedics;  Laterality: N/A;  . ANTERIOR FUSION CERVICAL SPINE  06/05/2015   C 5 6 7    . CHOLECYSTECTOMY    . COLONOSCOPY    . FOOT SURGERY Left   . MOLE REMOVAL    . WISDOM TOOTH EXTRACTION      Allergies  Allergen Reactions  . Bee Venom Swelling  . Biaxin [Clarithromycin] Swelling    Edema in joint Can take a Zpac ok  . Betadine [Povidone Iodine] Rash  . Chloraprep One Step  [Chlorhexidine Gluconate] Rash  . Duraprep [employer], Misc.] Rash  . Keflex [Cephalexin] Rash    Family History  Problem Relation Age of Onset  . Breast cancer Mother        breast  . Diabetes Mother   . Fibromyalgia Mother   . Heart disease Mother        CHF  . Hypertension Mother   . Kidney disease Mother   . Heart disease Father   . Emphysema Father   . Diabetes Brother   . Hypertension Brother        x 2  . Heart disease Maternal Grandmother   . Diabetes Maternal Grandmother   . Asthma Maternal Grandmother   . Arthritis Maternal Grandmother   . Heart disease Maternal Grandfather   . Arthritis Maternal Grandfather   . Cancer Maternal Grandfather        bladder  . Breast cancer Paternal Grandmother   . Heart disease Paternal Grandmother   . Arthritis Paternal Grandmother   . Stroke Paternal Grandmother   . Diabetes Paternal Grandfather   . Heart disease Paternal Grandfather   . Colon cancer Paternal Grandfather        mets  . Arthritis Paternal Grandfather   . Stomach cancer Paternal Grandfather        mets to stomach  . Hypertension Brother   . Esophageal cancer Neg Hx   .  Pancreatic cancer Neg Hx   . Rectal cancer Neg Hx      Social History   Socioeconomic History  . Marital status: Married    Spouse name: [name]  . Number of children: 4  . Years of education: college  . Highest education level: Not on file  Social Needs  . Financial resource strain: Not on file  . Food insecurity - worry: Not on file  . Food insecurity - inability: Not on file  . Transportation needs - medical: Not on file  . Transportation needs - non-medical: Not on file  Occupational History  . 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 Smoking status: Never Smoker  . Smokeless tobacco: Never Used  Substance and Sexual Activity  . Alcohol use: Yes    Alcohol/week: 0.0 oz    Comment: red wine 3 times weekly  . Drug use: No    . Sexual activity: Yes    Birth control/protection: None  Other Topics Concern  . 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   One caffeinated beverage daily   This was updated 12/12/2013     PHYSICAL EXAM:  VS: BP 137/68 (BP Location: Left Arm, Patient Position: Sitting, Cuff Size: Normal)   Pulse 63   Temp 97.7 F (36.5 C) (Oral)   Ht 5' 7.75" (1.721 m)   Wt 221 lb (100.2 kg)   SpO2 97%   BMI 33.85 kg/m  Physical Exam Gen: NAD, alert, cooperative with exam,  ENT: normal lips, normal nasal mucosa, tympanic membranes clear and intact bilaterally, normal oropharynx, no cervical lymphadenopathy Eye: normal EOM, normal conjunctiva and lids CV:  no edema, +2 pedal pulses, regular rate and rhythm, S1-S2   Resp: no accessory muscle use, non-labored, clear to auscultation bilaterally, no crackles or wheezes  Skin: no rashes, no areas of induration  Neuro: normal tone, normal sensation to touch Psych:  normal insight, alert and oriented MSK: Normal gait, normal strength       ASSESSMENT & PLAN:   Upper respiratory tract infection Likely viral in nature.  - counseled on supportive care  - work note provided

## 2017-08-23 NOTE — Assessment & Plan Note (Signed)
Likely viral in nature.  - counseled on supportive care  - work note provided

## 2017-08-24 ENCOUNTER — Other Ambulatory Visit: Payer: Federal, State, Local not specified - PPO | Admitting: Adult Health

## 2017-08-28 ENCOUNTER — Encounter: Payer: Self-pay | Admitting: Family Medicine

## 2017-09-13 ENCOUNTER — Ambulatory Visit: Payer: Self-pay | Admitting: Internal Medicine

## 2017-09-13 ENCOUNTER — Other Ambulatory Visit: Payer: Federal, State, Local not specified - PPO | Admitting: Adult Health

## 2017-09-19 DIAGNOSIS — L817 Pigmented purpuric dermatosis: Secondary | ICD-10-CM | POA: Diagnosis not present

## 2017-09-19 DIAGNOSIS — L821 Other seborrheic keratosis: Secondary | ICD-10-CM | POA: Diagnosis not present

## 2017-09-19 DIAGNOSIS — L814 Other melanin hyperpigmentation: Secondary | ICD-10-CM | POA: Diagnosis not present

## 2017-09-19 DIAGNOSIS — D1801 Hemangioma of skin and subcutaneous tissue: Secondary | ICD-10-CM | POA: Diagnosis not present

## 2017-09-22 ENCOUNTER — Encounter: Payer: Self-pay | Admitting: Adult Health

## 2017-09-27 ENCOUNTER — Other Ambulatory Visit: Payer: Self-pay | Admitting: Internal Medicine

## 2017-10-11 ENCOUNTER — Other Ambulatory Visit: Payer: Federal, State, Local not specified - PPO | Admitting: Adult Health

## 2017-10-24 ENCOUNTER — Other Ambulatory Visit (HOSPITAL_COMMUNITY)
Admission: RE | Admit: 2017-10-24 | Discharge: 2017-10-24 | Disposition: A | Discharge status: Home / Self Care | Payer: Federal, State, Local not specified - PPO | Source: Ambulatory Visit | Attending: Adult Health | Admitting: Adult Health

## 2017-10-24 ENCOUNTER — Ambulatory Visit: Payer: Federal, State, Local not specified - PPO | Admitting: Adult Health

## 2017-10-24 ENCOUNTER — Encounter: Payer: Self-pay | Admitting: Adult Health

## 2017-10-24 VITALS — BP 130/90 | HR 93 | Ht 67.0 in | Wt 219.0 lb

## 2017-10-24 DIAGNOSIS — N926 Irregular menstruation, unspecified: Secondary | ICD-10-CM | POA: Diagnosis not present

## 2017-10-24 DIAGNOSIS — Z1212 Encounter for screening for malignant neoplasm of rectum: Secondary | ICD-10-CM

## 2017-10-24 DIAGNOSIS — Z01411 Encounter for gynecological examination (general) (routine) with abnormal findings: Secondary | ICD-10-CM

## 2017-10-24 DIAGNOSIS — Z01419 Encounter for gynecological examination (general) (routine) without abnormal findings: Secondary | ICD-10-CM | POA: Insufficient documentation

## 2017-10-24 DIAGNOSIS — Z1211 Encounter for screening for malignant neoplasm of colon: Secondary | ICD-10-CM

## 2017-10-24 LAB — HEMOCCULT GUIAC POC 1CARD (OFFICE): Fecal Occult Blood, POC: NEGATIVE

## 2017-10-24 NOTE — Progress Notes (Signed)
Patient ID: Caroline Wong, female   DOB: 07/23/1960, 57 y.o.   MRN: [id number] History of Present Illness: Caroline Wong is a 57 year old white female, married in for a well woman gyn exam and pap. PCP is Dr Alain Marion.   Current Medications, Allergies, Past Medical History, Past Surgical History, Family History and Social History were reviewed in [employer] record.     Review of Systems: Patient denies any headaches, hearing loss, fatigue, blurred vision, shortness of breath, chest pain, abdominal pain, problems with bowel movements, urination, or intercourse. No joint pain or mood swings. Periods irregular, stopped HRT.    Physical Exam:BP 130/90 (BP Location: Left Arm, Patient Position: Sitting, Cuff Size: Small)   Pulse 93   Ht 5\' 7"  (1.702 m)   Wt 219 lb (99.3 kg)   LMP 09/24/2017   BMI 34.30 kg/m  General:  Well developed, well nourished, no acute distress Skin:  Warm and dry Neck:  Midline trachea, normal thyroid, good ROM, no lymphadenopathy Lungs; Clear to auscultation bilaterally Breast:  No dominant palpable mass, retraction, or nipple discharge Cardiovascular: Regular rate and rhythm Abdomen:  Soft, non tender, no hepatosplenomegaly Pelvic:  External genitalia is normal in appearance, no lesions.  The vagina is normal in appearance. Urethra has no lesions or masses. The cervix is bulbous. Pap with HPV performed. Uterus is felt to be normal size, shape, and contour.  No adnexal masses or tenderness noted.Bladder is non tender, no masses felt. Rectal: Good sphincter tone, no polyps, or hemorrhoids felt.  Hemoccult negative. Extremities/musculoskeletal:  No swelling or varicosities noted, no clubbing or cyanosis Psych:  No mood changes, alert and cooperative,seems happy PHQ 9 score 0.   Impression: 1. Encounter for gynecological examination with Papanicolaou smear of cervix   2. Screening for colorectal cancer       Plan: Physical in 1 year Pap in  3 if normal Mammogram yearly Labs with PCP Given names of belviq and contrave to research, for weight loss

## 2017-10-24 NOTE — Addendum Note (Signed)
Addended by: Diona Fanti A on: 10/24/2017 03:20 PM   Modules accepted: Orders

## 2017-10-26 LAB — CYTOLOGY - PAP
DIAGNOSIS: NEGATIVE
HPV: NOT DETECTED

## 2017-10-29 ENCOUNTER — Encounter: Payer: Self-pay | Admitting: Adult Health

## 2017-10-31 ENCOUNTER — Other Ambulatory Visit: Payer: Self-pay | Admitting: Adult Health

## 2017-10-31 DIAGNOSIS — R5383 Other fatigue: Secondary | ICD-10-CM

## 2017-10-31 DIAGNOSIS — Z13220 Encounter for screening for lipoid disorders: Secondary | ICD-10-CM

## 2017-10-31 DIAGNOSIS — Z131 Encounter for screening for diabetes mellitus: Secondary | ICD-10-CM

## 2017-10-31 DIAGNOSIS — Z1321 Encounter for screening for nutritional disorder: Secondary | ICD-10-CM

## 2017-10-31 NOTE — Progress Notes (Signed)
Will get labs, is feeling fatigued

## 2017-11-01 ENCOUNTER — Other Ambulatory Visit: Payer: Self-pay | Admitting: Internal Medicine

## 2017-11-01 DIAGNOSIS — R5383 Other fatigue: Secondary | ICD-10-CM | POA: Diagnosis not present

## 2017-11-01 DIAGNOSIS — Z1321 Encounter for screening for nutritional disorder: Secondary | ICD-10-CM | POA: Diagnosis not present

## 2017-11-01 DIAGNOSIS — Z13220 Encounter for screening for lipoid disorders: Secondary | ICD-10-CM | POA: Diagnosis not present

## 2017-11-01 DIAGNOSIS — Z131 Encounter for screening for diabetes mellitus: Secondary | ICD-10-CM | POA: Diagnosis not present

## 2017-11-01 MED ORDER — PHENTERMINE HCL 37.5 MG PO TABS: 37.5000 mg | ORAL_TABLET | Freq: Every morning | ORAL | 2 refills | Status: DC

## 2017-11-01 NOTE — Telephone Encounter (Signed)
Done erx 

## 2017-11-02 LAB — CBC
HEMATOCRIT: 40.6 % (ref 34.0–46.6)
HEMOGLOBIN: 13.8 g/dL (ref 11.1–15.9)
MCH: 31.9 pg (ref 26.6–33.0)
MCHC: 34 g/dL (ref 31.5–35.7)
MCV: 94 fL (ref 79–97)
Platelets: 299 10*3/uL (ref 150–450)
RBC: 4.32 x10E6/uL (ref 3.77–5.28)
RDW: 13.3 % (ref 12.3–15.4)
WBC: 6.9 10*3/uL (ref 3.4–10.8)

## 2017-11-02 LAB — COMPREHENSIVE METABOLIC PANEL
A/G RATIO: 1.8 (ref 1.2–2.2)
ALK PHOS: 86 IU/L (ref 39–117)
ALT: 35 IU/L — AB (ref 0–32)
AST: 26 IU/L (ref 0–40)
Albumin: 4.6 g/dL (ref 3.5–5.5)
BILIRUBIN TOTAL: 0.4 mg/dL (ref 0.0–1.2)
BUN/Creatinine Ratio: 26 — ABNORMAL HIGH (ref 9–23)
BUN: 28 mg/dL — AB (ref 6–24)
CO2: 24 mmol/L (ref 20–29)
Calcium: 9.5 mg/dL (ref 8.7–10.2)
Chloride: 99 mmol/L (ref 96–106)
Creatinine, Ser: 1.08 mg/dL — ABNORMAL HIGH (ref 0.57–1.00)
GFR calc Af Amer: 67 mL/min/{1.73_m2} (ref >59)
GFR calc non Af Amer: 58 mL/min/{1.73_m2} — ABNORMAL LOW (ref >59)
GLUCOSE: 110 mg/dL — AB (ref 65–99)
Globulin, Total: 2.6 g/dL (ref 1.5–4.5)
POTASSIUM: 4.3 mmol/L (ref 3.5–5.2)
Sodium: 139 mmol/L (ref 134–144)
Total Protein: 7.2 g/dL (ref 6.0–8.5)

## 2017-11-02 LAB — LIPID PANEL
CHOL/HDL RATIO: 3.1 ratio (ref 0.0–4.4)
Cholesterol, Total: 163 mg/dL (ref 100–199)
HDL: 53 mg/dL (ref >39)
LDL Calculated: 93 mg/dL (ref 0–99)
Triglycerides: 86 mg/dL (ref 0–149)
VLDL Cholesterol Cal: 17 mg/dL (ref 5–40)

## 2017-11-02 LAB — HEMOGLOBIN A1C
ESTIMATED AVERAGE GLUCOSE: 123 mg/dL
HEMOGLOBIN A1C: 5.9 % — AB (ref 4.8–5.6)

## 2017-11-02 LAB — VITAMIN D 25 HYDROXY (VIT D DEFICIENCY, FRACTURES): Vit D, 25-Hydroxy: 99 ng/mL (ref 30.0–100.0)

## 2017-11-02 LAB — TSH: TSH: 1.86 u[IU]/mL (ref 0.450–4.500)

## 2017-12-17 ENCOUNTER — Encounter: Payer: Self-pay | Admitting: Internal Medicine

## 2017-12-30 ENCOUNTER — Other Ambulatory Visit: Payer: Self-pay | Admitting: Internal Medicine

## 2018-01-01 ENCOUNTER — Other Ambulatory Visit: Payer: Self-pay

## 2018-01-01 ENCOUNTER — Encounter (HOSPITAL_COMMUNITY): Payer: Self-pay | Admitting: *Deleted

## 2018-01-01 ENCOUNTER — Emergency Department (HOSPITAL_COMMUNITY)
Admission: EM | Admit: 2018-01-01 | Discharge: 2018-01-02 | Disposition: A | Discharge status: Home / Self Care | Payer: Federal, State, Local not specified - PPO | Attending: Emergency Medicine | Admitting: Emergency Medicine

## 2018-01-01 DIAGNOSIS — Z79899 Other long term (current) drug therapy: Secondary | ICD-10-CM | POA: Insufficient documentation

## 2018-01-01 DIAGNOSIS — Z7984 Long term (current) use of oral hypoglycemic drugs: Secondary | ICD-10-CM | POA: Diagnosis not present

## 2018-01-01 DIAGNOSIS — R040 Epistaxis: Secondary | ICD-10-CM | POA: Insufficient documentation

## 2018-01-01 DIAGNOSIS — I10 Essential (primary) hypertension: Secondary | ICD-10-CM | POA: Insufficient documentation

## 2018-01-01 HISTORY — DX: Type 2 diabetes mellitus without complications: E11.9

## 2018-01-01 MED ORDER — LIDOCAINE HCL (PF) 1 % IJ SOLN
INTRAMUSCULAR | Status: AC
  Filled 2018-01-01: qty 2

## 2018-01-01 MED ORDER — SILVER NITRATE-POT NITRATE 75-25 % EX MISC
1.0000 | Freq: Once | CUTANEOUS | Status: AC
  Administered 2018-01-02: 1 via TOPICAL
  Filled 2018-01-01: qty 1

## 2018-01-01 MED ORDER — LIDOCAINE HCL (PF) 4 % IJ SOLN: 5.0000 mL | Freq: Once | INTRAMUSCULAR | Status: DC

## 2018-01-01 MED ORDER — LIDOCAINE HCL (PF) 1 % IJ SOLN
INTRAMUSCULAR | Status: AC
  Administered 2018-01-01: 2 mL
  Filled 2018-01-01: qty 2

## 2018-01-01 MED ORDER — LIDOCAINE HCL (PF) 2 % IJ SOLN
2.0000 mL | Freq: Once | INTRAMUSCULAR | Status: AC
Start: 2018-01-01 — End: 2018-01-02
  Administered 2018-01-02: 2 mL

## 2018-01-01 MED ORDER — OXYMETAZOLINE HCL 0.05 % NA SOLN
1.0000 | Freq: Once | NASAL | Status: AC
  Administered 2018-01-01: 1 via NASAL
  Filled 2018-01-01: qty 15

## 2018-01-01 MED ORDER — ACETAMINOPHEN 325 MG PO TABS
650.0000 mg | ORAL_TABLET | Freq: Once | ORAL | Status: AC
  Administered 2018-01-01: 650 mg via ORAL
  Filled 2018-01-01: qty 2

## 2018-01-01 MED ORDER — AMLODIPINE BESYLATE 5 MG PO TABS
5.0000 mg | ORAL_TABLET | Freq: Once | ORAL | Status: AC
Start: 2018-01-01 — End: 2018-01-01
  Administered 2018-01-01: 5 mg via ORAL
  Filled 2018-01-01: qty 1

## 2018-01-01 NOTE — ED Provider Notes (Signed)
[facility] EMERGENCY DEPARTMENT Provider Note   CSN: [id number] Arrival date & time: 01/01/18  2138     History   Chief Complaint Chief Complaint  Patient presents with  . Epistaxis    HPI Caroline Wong is a 57 y.o. female.  HPI Patient presents to the emergency room for evaluation of a nosebleed.  Patient states she was driving home today when all of a sudden she started to bleed from her nostril.  This lasted for approximately 20 minutes.  She applied pressure was eventually able to get it to stop.  Patient was getting ready to go to bed 1 ulcer on the bleeding started again.  She noticed her blood pressure was elevated.  She took her evening blood pressure medication and came to the emergency room.  Patient denies any prior history of nosebleeds.  She denies any fevers or chills.  No numbness or weakness. Past Medical History:  Diagnosis Date  . Abdominal pain 11/25/2013   Has some nausea associated with pain that has been on and off x 2 weeks will get [place]  . Anxiety    Takes Lorazepam  . Chronic bronchitis ([place])   . Diabetes mellitus without complication ([place])   . Gallstones   . GERD (gastroesophageal reflux disease)   . Hormone replacement therapy (HRT)   . Hypertension   . Night sweats 04/14/2014  . Obesity 05/07/2014  . Urge incontinence 08/19/2015  . Yeast infection 05/07/2014    Patient Active Problem List   Diagnosis Date Noted  . Screening for colorectal cancer 10/24/2017  . Encounter for gynecological examination with Papanicolaou smear of cervix 10/24/2017  . Cough 06/12/2017  . Upper respiratory tract infection 06/12/2017  . Anxiety 11/14/2016  . UTI (urinary tract infection) 11/11/2015  . Diarrhea 11/11/2015  . GERD (gastroesophageal reflux disease) 10/23/2015  . BP check 09/24/2015  . Urge incontinence 08/19/2015  . Stye external 07/15/2015  . S/P cervical spinal fusion 06/05/2015  . Acute sinusitis 05/20/2015  . Fatigue 05/13/2015  . Edema  04/01/2015  . Chest pain 04/01/2015  . Nonspecific abnormal electrocardiogram (ECG) (EKG) 04/01/2015  . 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 Neck pain, bilateral posterior 12/31/2014  . Lower back pain 12/31/2014  . Yeast infection 05/07/2014  . Obesity 05/07/2014  . Night sweats 04/14/2014  . Abdominal pain 11/25/2013  . Hormone replacement therapy (HRT) 08/21/2013  . Well adult exam 05/16/2013  . Hypertension     Past Surgical History:  Procedure Laterality Date  . ANTERIOR CERVICAL DECOMP/DISCECTOMY FUSION N/A 06/05/2015   Procedure: C5-6, C6-7 Anterior Cervical Discectomy and Fusion, Allograft, Plate;  Surgeon: [name], MD;  Location: [employer];  Service: Orthopedics;  Laterality: N/A;  . ANTERIOR FUSION CERVICAL SPINE  06/05/2015   C 5 6 7    . CHOLECYSTECTOMY    . COLONOSCOPY    . FOOT SURGERY Left   . MOLE REMOVAL    . WISDOM TOOTH EXTRACTION       OB History    Gravida  2   Para  2   Term  2   Preterm      AB      Living  2     SAB      TAB      Ectopic      Multiple      Live Births  2            Home Medications    Prior to Admission medications  Medication Sig Start Date End Date Taking? Authorizing Provider  amLODipine (NORVASC) 5 MG tablet Take 1 tablet (5 mg total) by mouth daily. Annual appt due in June must see provider for future refills 09/28/17   Plotnikov, [name], MD  bifidobacterium infantis (ALIGN) capsule Take 1 capsule by mouth daily. 08/23/16   Estill Dooms, NP  Cholecalciferol (VITAMIN D3) 5000 units TABS Take by mouth. Takes 2 daily    [provider]  cyclobenzaprine (FLEXERIL) 10 MG tablet Take 1 tablet (10 mg total) by mouth 3 (three) times daily as needed for muscle spasms. 12/25/15   Gatha Mayer, MD  diclofenac (VOLTAREN) 75 MG EC tablet Take 1 tablet (75 mg total) by mouth 2 (two) times daily. 01/30/17   Wallene Huh, DPM  diphenhydrAMINE (BENADRYL) 25 MG tablet Take 2 tablets (50 mg  total) by mouth every 4 (four) hours as needed (bee sting). 02/24/15   Plotnikov, [name], MD  diphenoxylate-atropine (LOMOTIL) 2.5-0.025 MG tablet take 1 tablet by mouth four times a day if needed 09/12/16   Plotnikov, [name], MD  hydrochlorothiazide (MICROZIDE) 12.5 MG capsule Take 1 capsule (12.5 mg total) by mouth daily. 07/17/17   Plotnikov, [name], MD  LORazepam (ATIVAN) 0.5 MG tablet take 1 tablet by mouth at bedtime if needed 08/02/17   Plotnikov, [name], MD  metFORMIN (GLUCOPHAGE) 500 MG tablet Take 1,000 mg by mouth. Takes 2 at supper    [provider]  metFORMIN (GLUCOPHAGE-XR) 500 MG 24 hr tablet TK 4 TS PO QD WITH THE EVE MEAL 11/01/17   [provider]  metoprolol succinate (TOPROL-XL) 50 MG 24 hr tablet TAKE 1 TABLET BY MOUTH ONCE DAILY 01/01/18   Plotnikov, [name], MD  olmesartan (BENICAR) 40 MG tablet Take 1 tablet (40 mg total) by mouth daily. 07/17/17   Plotnikov, [name], MD  olmesartan-hydrochlorothiazide (BENICAR HCT) 40-12.5 MG tablet take 1 tablet by mouth once daily 09/27/16   Plotnikov, [name], MD  phentermine (ADIPEX-P) 37.5 MG tablet Take 1 tablet (37.5 mg total) by mouth every morning. 11/01/17   Biagio Borg, MD  traMADol Veatrice Bourbon) 50 MG tablet take 1 tablet by mouth every 6 hours if needed 06/21/17   Plotnikov, [name], MD  valACYclovir (VALTREX) 1000 MG tablet take 1 tablet by mouth once daily 09/21/15   Estill Dooms, NP    Family History Family History  Problem Relation Age of Onset  . Breast cancer Mother        breast  . Diabetes Mother   . Fibromyalgia Mother   . Heart disease Mother        CHF  . Hypertension Mother   . Kidney disease Mother   . Heart disease Father   . Emphysema Father   . Diabetes Brother   . Hypertension Brother        x 2  . Heart disease Maternal Grandmother   . Diabetes Maternal Grandmother   . Asthma Maternal Grandmother   . Arthritis Maternal Grandmother   . Heart disease Maternal  Grandfather   . Arthritis Maternal Grandfather   . Cancer Maternal Grandfather        bladder  . Breast cancer Paternal Grandmother   . Heart disease Paternal Grandmother   . Arthritis Paternal Grandmother   . Stroke Paternal Grandmother   . Diabetes Paternal Grandfather   . Heart disease Paternal Grandfather   . Colon cancer Paternal Grandfather  mets  . Arthritis Paternal Grandfather   . Stomach cancer Paternal Grandfather        mets to stomach  . Hypertension Brother   . Esophageal cancer Neg Hx   . Pancreatic cancer Neg Hx   . Rectal cancer Neg Hx     Social History Social History   Tobacco Use  . Smoking status: Never Smoker  . Smokeless tobacco: Never Used  Substance Use Topics  . Alcohol use: Yes    Alcohol/week: 0.0 oz    Comment: red wine 3 times weekly  . Drug use: No     Allergies   Bee venom; Biaxin [clarithromycin]; Betadine [povidone iodine]; Chloraprep one step [chlorhexidine gluconate]; Duraprep [antiseptic products, misc.]; and Keflex [cephalexin]   Review of Systems Review of Systems  All other systems reviewed and are negative.    Physical Exam Updated Vital Signs BP (!) 163/92   Pulse 70   Temp 97.7 F (36.5 C) (Temporal)   Resp 18   Ht 1.753 m (5\' 9" )   Wt 98.9 kg (218 lb)   LMP 08/03/2017   SpO2 98%   BMI 32.19 kg/m   Physical Exam  Constitutional: She appears well-developed and well-nourished. No distress.  HENT:  Head: Normocephalic and atraumatic.  Right Ear: External ear normal.  Left Ear: External ear normal.  Blood noted in the medial aspect of the right nares, no active bleeding  Eyes: Conjunctivae are normal. Right eye exhibits no discharge. Left eye exhibits no discharge. No scleral icterus.  Neck: Neck supple. No tracheal deviation present.  Cardiovascular: Normal rate, regular rhythm and intact distal pulses.  Pulmonary/Chest: Effort normal and breath sounds normal. No stridor. No respiratory distress. She  has no wheezes. She has no rales.  Abdominal: Soft. Bowel sounds are normal. She exhibits no distension. There is no tenderness. There is no rebound and no guarding.  Musculoskeletal: She exhibits no edema or tenderness.  Neurological: She is alert. She has normal strength. No cranial nerve deficit (no facial droop, extraocular movements intact, no slurred speech) or sensory deficit. She exhibits normal muscle tone. She displays no seizure activity. Coordination normal.  Skin: Skin is warm and dry. No rash noted.  Psychiatric: She has a normal mood and affect.  Nursing note and vitals reviewed.    ED Treatments / Results  Labs (all labs ordered are listed, but only abnormal results are displayed) Labs Reviewed - No data to display  EKG None  Radiology No results found.  Procedures .Epistaxis Management Date/Time: 01/02/2018 12:48 AM Performed by: Dorie Rank, MD Authorized by: Dorie Rank, MD   Consent:    Consent obtained:  Verbal   Consent given by:  Patient   Risks discussed:  Bleeding   Alternatives discussed:  No treatment Universal protocol:    Procedure explained and questions answered to patient or proxy's satisfaction: yes     Relevant documents present and verified: yes     Test results available and properly labeled: yes     Imaging studies available: yes     Required blood products, implants, devices, and special equipment available: yes     Site/side marked: yes     Time out called: yes     Patient identity confirmed:  Verbally with patient Anesthesia (see MAR for exact dosages):    Anesthesia method:  Topical application   Topical anesthesia: lidocaine. Procedure details:    Treatment site:  R anterior   Treatment method:  Silver nitrate   Treatment  complexity:  Limited   Treatment episode: initial   Post-procedure details:    Assessment:  Bleeding stopped   Patient tolerance of procedure:  Tolerated well, no immediate complications   (including  critical care time)  Medications Ordered in ED Medications  lidocaine (XYLOCAINE) 2 % injection 2 mL (has no administration in time range)  silver nitrate applicators applicator 1 Stick (has no administration in time range)  oxymetazoline (AFRIN) 0.05 % nasal spray 1 spray (1 spray Each Nare Given 01/01/18 2248)  amLODipine (NORVASC) tablet 5 mg (5 mg Oral Given 01/01/18 2248)  lidocaine (PF) (XYLOCAINE) 1 % injection (2 mLs  Given by Other 01/01/18 2300)  acetaminophen (TYLENOL) tablet 650 mg (650 mg Oral Given 01/01/18 2311)     Initial Impression / Assessment and Plan / ED Course  I have reviewed the triage vital signs and the nursing notes.  Pertinent labs & imaging results that were available during my care of the patient were reviewed by me and considered in my medical decision making (see chart for details).    Patient presented to the emergency room for a nosebleed.  Patient had a source of bleeding in the anterior aspect of her nose.  Silver nitrate was applied.  There was some rebleeding but this was addressed with a cottonball soaked in Afrin and lidocaine.  Patient was discharged in stable condition   She was hypertensive in the emergency room.  She was given additional dose of Norvasc. Final Clinical Impressions(s) / ED Diagnoses   Final diagnoses:  Epistaxis  Hypertension, unspecified type    ED Discharge Orders    None       Dorie Rank, MD 01/02/18 [phone]

## 2018-01-01 NOTE — ED Triage Notes (Signed)
Pt c/o nosebleed that started this evening with elevated blood pressure and headache,

## 2018-01-02 DIAGNOSIS — J370 Chronic laryngitis: Secondary | ICD-10-CM | POA: Diagnosis not present

## 2018-01-02 DIAGNOSIS — J322 Chronic ethmoidal sinusitis: Secondary | ICD-10-CM | POA: Diagnosis not present

## 2018-01-02 DIAGNOSIS — R040 Epistaxis: Secondary | ICD-10-CM | POA: Diagnosis not present

## 2018-01-02 DIAGNOSIS — J320 Chronic maxillary sinusitis: Secondary | ICD-10-CM | POA: Diagnosis not present

## 2018-01-02 NOTE — Discharge Instructions (Addendum)
You can remove the cottonball in the morning.  Consider following up with an ENT doctor if you have any recurrent symptoms.  If the bleeding restarts hold pressure for 20 minutes.  If the bleeding has not resolved by that point return to the ED for further evaluation

## 2018-01-05 ENCOUNTER — Encounter: Payer: Self-pay | Admitting: Internal Medicine

## 2018-01-05 ENCOUNTER — Other Ambulatory Visit (INDEPENDENT_AMBULATORY_CARE_PROVIDER_SITE_OTHER): Payer: Federal, State, Local not specified - PPO

## 2018-01-05 ENCOUNTER — Ambulatory Visit: Payer: Federal, State, Local not specified - PPO | Admitting: Internal Medicine

## 2018-01-05 VITALS — BP 132/88 | HR 73 | Temp 98.4°F | Ht 69.0 in

## 2018-01-05 DIAGNOSIS — R233 Spontaneous ecchymoses: Secondary | ICD-10-CM

## 2018-01-05 DIAGNOSIS — R040 Epistaxis: Secondary | ICD-10-CM

## 2018-01-05 DIAGNOSIS — J0100 Acute maxillary sinusitis, unspecified: Secondary | ICD-10-CM | POA: Diagnosis not present

## 2018-01-05 DIAGNOSIS — I10 Essential (primary) hypertension: Secondary | ICD-10-CM

## 2018-01-05 DIAGNOSIS — J342 Deviated nasal septum: Secondary | ICD-10-CM | POA: Diagnosis not present

## 2018-01-05 LAB — CBC WITH DIFFERENTIAL/PLATELET
BASOS ABS: 0.1 10*3/uL (ref 0.0–0.1)
BASOS PCT: 0.7 % (ref 0.0–3.0)
EOS ABS: 0.2 10*3/uL (ref 0.0–0.7)
Eosinophils Relative: 1.9 % (ref 0.0–5.0)
HCT: 40.2 % (ref 36.0–46.0)
Hemoglobin: 13.7 g/dL (ref 12.0–15.0)
Lymphocytes Relative: 22.3 % (ref 12.0–46.0)
Lymphs Abs: 2 10*3/uL (ref 0.7–4.0)
MCHC: 34 g/dL (ref 30.0–36.0)
MCV: 91.7 fl (ref 78.0–100.0)
MONO ABS: 0.5 10*3/uL (ref 0.1–1.0)
Monocytes Relative: 6 % (ref 3.0–12.0)
NEUTROS ABS: 6.2 10*3/uL (ref 1.4–7.7)
Neutrophils Relative %: 69.1 % (ref 43.0–77.0)
Platelets: 290 10*3/uL (ref 150.0–400.0)
RBC: 4.39 Mil/uL (ref 3.87–5.11)
RDW: 12.3 % (ref 11.5–15.5)
WBC: 8.9 10*3/uL (ref 4.0–10.5)

## 2018-01-05 LAB — SEDIMENTATION RATE: Sed Rate: 44 mm/hr — ABNORMAL HIGH (ref 0–30)

## 2018-01-05 LAB — BASIC METABOLIC PANEL
BUN: 26 mg/dL — AB (ref 6–23)
CHLORIDE: 101 meq/L (ref 96–112)
CO2: 28 meq/L (ref 19–32)
CREATININE: 1.03 mg/dL (ref 0.40–1.20)
Calcium: 9.4 mg/dL (ref 8.4–10.5)
GFR: 59.19 mL/min — ABNORMAL LOW (ref >60.00)
Glucose, Bld: 151 mg/dL — ABNORMAL HIGH (ref 70–99)
Potassium: 3.5 mEq/L (ref 3.5–5.1)
Sodium: 138 mEq/L (ref 135–145)

## 2018-01-05 LAB — APTT: APTT: 45.5 s — AB (ref 23.4–32.7)

## 2018-01-05 MED ORDER — METOPROLOL SUCCINATE ER 50 MG PO TB24
50.0000 mg | ORAL_TABLET | Freq: Every day | ORAL | 11 refills | Status: DC
Start: 2018-01-05 — End: 2019-02-18

## 2018-01-05 MED ORDER — LORAZEPAM 0.5 MG PO TABS: ORAL_TABLET | ORAL | 5 refills | Status: DC

## 2018-01-05 MED ORDER — METFORMIN HCL ER 500 MG PO TB24: ORAL_TABLET | ORAL | 11 refills | Status: DC

## 2018-01-05 MED ORDER — OLMESARTAN MEDOXOMIL 40 MG PO TABS: 40.0000 mg | ORAL_TABLET | Freq: Every day | ORAL | 11 refills | Status: DC

## 2018-01-05 MED ORDER — HYDROCHLOROTHIAZIDE 12.5 MG PO CAPS: 12.5000 mg | ORAL_CAPSULE | Freq: Every day | ORAL | 11 refills | Status: DC

## 2018-01-05 NOTE — Assessment & Plan Note (Signed)
Olmesartan HCT Amlodipine 5 mg/d - hold 2019 due to LE rash Toprol XL 1/2 tab qd 12.5 mg

## 2018-01-05 NOTE — Assessment & Plan Note (Signed)
7/19 Dr Marcelline Mates - on abx

## 2018-01-05 NOTE — Assessment & Plan Note (Addendum)
B LEs - red spots on LEs x months - dermatologist said could be related to BP meds (spring 2019) multiple tiny petechiae on B LEs; trace B edema on exam

## 2018-01-05 NOTE — Assessment & Plan Note (Signed)
Dr Marcelline Mates R 7/19 - resolved Labs No ASA

## 2018-01-05 NOTE — Patient Instructions (Signed)
Take Vitamin C Stop Amlodipine

## 2018-01-05 NOTE — Progress Notes (Signed)
Subjective:  Patient ID: Caroline Wong, female    DOB: 04-28-1961  Age: 57 y.o. MRN: [id number]  CC: No chief complaint on file.   HPI Caroline Wong presents for nose bleed x 1 Mon - went to ER, then ENT: on abx for sinusitis F/u HTN, DM, anxiety C/o red spots on LEs x months - dermatologist said could be related to BP meds (spring 2019)   Outpatient Medications Prior to Visit  Medication Sig Dispense Refill  . amLODipine (NORVASC) 5 MG tablet Take 1 tablet (5 mg total) by mouth daily. Annual appt due in June must see provider for future refills 90 tablet 0  . bifidobacterium infantis (ALIGN) capsule Take 1 capsule by mouth daily.    . Cholecalciferol (VITAMIN D3) 5000 units TABS Take by mouth. Takes 2 daily    . cyclobenzaprine (FLEXERIL) 10 MG tablet Take 1 tablet (10 mg total) by mouth 3 (three) times daily as needed for muscle spasms. 60 tablet 0  . diclofenac (VOLTAREN) 75 MG EC tablet Take 1 tablet (75 mg total) by mouth 2 (two) times daily. 50 tablet 2  . diphenhydrAMINE (BENADRYL) 25 MG tablet Take 2 tablets (50 mg total) by mouth every 4 (four) hours as needed (bee sting). 30 tablet 3  . diphenoxylate-atropine (LOMOTIL) 2.5-0.025 MG tablet take 1 tablet by mouth four times a day if needed 60 tablet 0  . doxycycline (VIBRAMYCIN) 100 MG capsule   0  . hydrochlorothiazide (MICROZIDE) 12.5 MG capsule Take 1 capsule (12.5 mg total) by mouth daily. 30 capsule 11  . LORazepam (ATIVAN) 0.5 MG tablet take 1 tablet by mouth at bedtime if needed 30 tablet 5  . metFORMIN (GLUCOPHAGE) 500 MG tablet Take 1,000 mg by mouth. Takes 2 at supper    . metFORMIN (GLUCOPHAGE-XR) 500 MG 24 hr tablet TK 4 TS PO QD WITH THE EVE MEAL  3  . metoprolol succinate (TOPROL-XL) 50 MG 24 hr tablet TAKE 1 TABLET BY MOUTH ONCE DAILY 30 tablet 0  . olmesartan (BENICAR) 40 MG tablet Take 1 tablet (40 mg total) by mouth daily. 30 tablet 11  . olmesartan-hydrochlorothiazide (BENICAR HCT) 40-12.5 MG tablet take  1 tablet by mouth once daily 30 tablet 11  . phentermine (ADIPEX-P) 37.5 MG tablet Take 1 tablet (37.5 mg total) by mouth every morning. 30 tablet 2  . traMADol (ULTRAM) 50 MG tablet take 1 tablet by mouth every 6 hours if needed 60 tablet 1  . valACYclovir (VALTREX) 1000 MG tablet take 1 tablet by mouth once daily 30 tablet PRN   Facility-Administered Medications Prior to Visit  Medication Dose Route Frequency Provider Last Rate Last Dose  . aminophylline injection 75 mg  75 mg Intravenous BID PRN Dorothy Spark, MD   75 mg at 04/13/15 1037    ROS: Review of Systems  Constitutional: Negative for activity change, appetite change, chills, fatigue and unexpected weight change.  HENT: Negative for congestion, mouth sores and sinus pressure.   Eyes: Negative for visual disturbance.  Respiratory: Negative for cough and chest tightness.   Gastrointestinal: Negative for abdominal pain and nausea.  Genitourinary: Negative for difficulty urinating, frequency and vaginal pain.  Musculoskeletal: Negative for back pain and gait problem.  Skin: Positive for rash. Negative for pallor.  Neurological: Negative for dizziness, tremors, weakness, numbness and headaches.  Psychiatric/Behavioral: Negative for confusion and sleep disturbance.    Objective:  BP 132/88 (BP Location: Left Arm, Patient Position: Sitting, Cuff Size: Large)  Pulse 73   Temp 98.4 F (36.9 C) (Oral)   Ht 5\' 9"  (1.753 m)   SpO2 98%   BMI 32.19 kg/m   BP Readings from Last 3 Encounters:  01/05/18 132/88  01/02/18 (!) 163/92  10/24/17 130/90    Wt Readings from Last 3 Encounters:  01/01/18 218 lb (98.9 kg)  10/24/17 219 lb (99.3 kg)  08/22/17 221 lb (100.2 kg)    Physical Exam  Constitutional: She appears well-developed. No distress.  HENT:  Head: Normocephalic.  Right Ear: External ear normal.  Left Ear: External ear normal.  Nose: Nose normal.  Mouth/Throat: Oropharynx is clear and moist.  Eyes: Pupils  are equal, round, and reactive to light. Conjunctivae are normal. Right eye exhibits no discharge. Left eye exhibits no discharge.  Neck: Normal range of motion. Neck supple. No JVD present. No tracheal deviation present. No thyromegaly present.  Cardiovascular: Normal rate, regular rhythm and normal heart sounds.  Pulmonary/Chest: No stridor. No respiratory distress. She has no wheezes.  Abdominal: Soft. Bowel sounds are normal. She exhibits no distension and no mass. There is no tenderness. There is no rebound and no guarding.  Musculoskeletal: She exhibits no edema or tenderness.  Lymphadenopathy:    She has no cervical adenopathy.  Neurological: She displays normal reflexes. No cranial nerve deficit. She exhibits normal muscle tone. Coordination normal.  Skin: No rash noted. No erythema.  Psychiatric: She has a normal mood and affect. Her behavior is normal. Judgment and thought content normal.  multiple tiny petechiae on B LEs; trace B edema  Lab Results  Component Value Date   WBC 6.9 11/01/2017   HGB 13.8 11/01/2017   HCT 40.6 11/01/2017   PLT 299 11/01/2017   GLUCOSE 110 (H) 11/01/2017   CHOL 163 11/01/2017   TRIG 86 11/01/2017   HDL 53 11/01/2017   LDLCALC 93 11/01/2017   ALT 35 (H) 11/01/2017   AST 26 11/01/2017   NA 139 11/01/2017   K 4.3 11/01/2017   CL 99 11/01/2017   CREATININE 1.08 (H) 11/01/2017   BUN 28 (H) 11/01/2017   CO2 24 11/01/2017   TSH 1.860 11/01/2017   INR 1.02 05/27/2015   HGBA1C 5.9 (H) 11/01/2017    No results found.  Assessment & Plan:   There are no diagnoses linked to this encounter.   No orders of the defined types were placed in this encounter.    Follow-up: No follow-ups on file.  Walker Kehr, MD

## 2018-01-07 ENCOUNTER — Other Ambulatory Visit: Payer: Self-pay | Admitting: Internal Medicine

## 2018-01-07 DIAGNOSIS — R739 Hyperglycemia, unspecified: Secondary | ICD-10-CM

## 2018-01-07 DIAGNOSIS — R040 Epistaxis: Secondary | ICD-10-CM

## 2018-02-06 ENCOUNTER — Other Ambulatory Visit (INDEPENDENT_AMBULATORY_CARE_PROVIDER_SITE_OTHER): Payer: Federal, State, Local not specified - PPO

## 2018-02-06 ENCOUNTER — Ambulatory Visit: Payer: Federal, State, Local not specified - PPO | Admitting: Internal Medicine

## 2018-02-06 ENCOUNTER — Encounter: Payer: Self-pay | Admitting: Internal Medicine

## 2018-02-06 ENCOUNTER — Encounter

## 2018-02-06 DIAGNOSIS — R040 Epistaxis: Secondary | ICD-10-CM

## 2018-02-06 DIAGNOSIS — I10 Essential (primary) hypertension: Secondary | ICD-10-CM | POA: Diagnosis not present

## 2018-02-06 DIAGNOSIS — F410 Panic disorder [episodic paroxysmal anxiety] without agoraphobia: Secondary | ICD-10-CM

## 2018-02-06 DIAGNOSIS — R7309 Other abnormal glucose: Secondary | ICD-10-CM

## 2018-02-06 DIAGNOSIS — R600 Localized edema: Secondary | ICD-10-CM | POA: Diagnosis not present

## 2018-02-06 DIAGNOSIS — R739 Hyperglycemia, unspecified: Secondary | ICD-10-CM | POA: Diagnosis not present

## 2018-02-06 LAB — CBC WITH DIFFERENTIAL/PLATELET
BASOS ABS: 0.1 10*3/uL (ref 0.0–0.1)
Basophils Relative: 0.9 % (ref 0.0–3.0)
EOS ABS: 0.2 10*3/uL (ref 0.0–0.7)
Eosinophils Relative: 2.5 % (ref 0.0–5.0)
HCT: 41.3 % (ref 36.0–46.0)
Hemoglobin: 13.7 g/dL (ref 12.0–15.0)
LYMPHS ABS: 1.9 10*3/uL (ref 0.7–4.0)
Lymphocytes Relative: 22.9 % (ref 12.0–46.0)
MCHC: 33.3 g/dL (ref 30.0–36.0)
MCV: 92.9 fl (ref 78.0–100.0)
MONO ABS: 0.5 10*3/uL (ref 0.1–1.0)
MONOS PCT: 6 % (ref 3.0–12.0)
NEUTROS ABS: 5.7 10*3/uL (ref 1.4–7.7)
NEUTROS PCT: 67.7 % (ref 43.0–77.0)
Platelets: 283 10*3/uL (ref 150.0–400.0)
RBC: 4.44 Mil/uL (ref 3.87–5.11)
RDW: 13 % (ref 11.5–15.5)
WBC: 8.5 10*3/uL (ref 4.0–10.5)

## 2018-02-06 LAB — BASIC METABOLIC PANEL
BUN: 23 mg/dL (ref 6–23)
CHLORIDE: 104 meq/L (ref 96–112)
CO2: 28 mEq/L (ref 19–32)
Calcium: 9.1 mg/dL (ref 8.4–10.5)
Creatinine, Ser: 0.94 mg/dL (ref 0.40–1.20)
GFR: 65.75 mL/min (ref >60.00)
Glucose, Bld: 120 mg/dL — ABNORMAL HIGH (ref 70–99)
Potassium: 4 mEq/L (ref 3.5–5.1)
SODIUM: 141 meq/L (ref 135–145)

## 2018-02-06 LAB — HEMOGLOBIN A1C: Hgb A1c MFr Bld: 6.1 % (ref 4.6–6.5)

## 2018-02-06 LAB — SEDIMENTATION RATE: Sed Rate: 38 mm/hr — ABNORMAL HIGH (ref 0–30)

## 2018-02-06 LAB — PROTIME-INR
INR: 0.9 ratio (ref 0.8–1.0)
PROTHROMBIN TIME: 10.4 s (ref 9.6–13.1)

## 2018-02-06 LAB — APTT: APTT: 41.8 s — AB (ref 23.4–32.7)

## 2018-02-06 MED ORDER — METFORMIN HCL ER 500 MG PO TB24: ORAL_TABLET | ORAL | 11 refills | Status: DC

## 2018-02-06 MED ORDER — TRIAMTERENE-HCTZ 37.5-25 MG PO TABS: 1.0000 | ORAL_TABLET | Freq: Every day | ORAL | 3 refills | Status: DC

## 2018-02-06 MED ORDER — PHENTERMINE HCL 37.5 MG PO TABS: 37.5000 mg | ORAL_TABLET | Freq: Every morning | ORAL | 2 refills | Status: DC

## 2018-02-06 NOTE — Assessment & Plan Note (Signed)
Resolved off Norvasc 

## 2018-02-06 NOTE — Assessment & Plan Note (Signed)
Worse Lorazepam prn FMLA

## 2018-02-06 NOTE — Progress Notes (Signed)
Subjective:  Patient ID: Caroline Wong, female    DOB: September 15, 1960  Age: 57 y.o. MRN: [id number]  CC: No chief complaint on file.   HPI Caroline Wong presents for panic attacks over her job in the past month. She had 2 attacks in the past month. BP 150/100 at home  Outpatient Medications Prior to Visit  Medication Sig Dispense Refill  . bifidobacterium infantis (ALIGN) capsule Take 1 capsule by mouth daily.    . Cholecalciferol (VITAMIN D3) 5000 units TABS Take by mouth. Takes 2 daily    . cyclobenzaprine (FLEXERIL) 10 MG tablet Take 1 tablet (10 mg total) by mouth 3 (three) times daily as needed for muscle spasms. 60 tablet 0  . diclofenac (VOLTAREN) 75 MG EC tablet Take 1 tablet (75 mg total) by mouth 2 (two) times daily. 50 tablet 2  . diphenhydrAMINE (BENADRYL) 25 MG tablet Take 2 tablets (50 mg total) by mouth every 4 (four) hours as needed (bee sting). 30 tablet 3  . diphenoxylate-atropine (LOMOTIL) 2.5-0.025 MG tablet take 1 tablet by mouth four times a day if needed 60 tablet 0  . hydrochlorothiazide (MICROZIDE) 12.5 MG capsule Take 1 capsule (12.5 mg total) by mouth daily. 30 capsule 11  . LORazepam (ATIVAN) 0.5 MG tablet take 1 tablet by mouth at bedtime if needed 30 tablet 5  . metFORMIN (GLUCOPHAGE-XR) 500 MG 24 hr tablet 4 tablets at hs 120 tablet 11  . metoprolol succinate (TOPROL-XL) 50 MG 24 hr tablet Take 1 tablet (50 mg total) by mouth daily. Take with or immediately following a meal. 30 tablet 11  . olmesartan (BENICAR) 40 MG tablet Take 1 tablet (40 mg total) by mouth daily. 30 tablet 11  . phentermine (ADIPEX-P) 37.5 MG tablet Take 1 tablet (37.5 mg total) by mouth every morning. 30 tablet 2  . traMADol (ULTRAM) 50 MG tablet take 1 tablet by mouth every 6 hours if needed 60 tablet 1  . valACYclovir (VALTREX) 1000 MG tablet take 1 tablet by mouth once daily 30 tablet PRN  . amLODipine (NORVASC) 5 MG tablet Take 1 tablet (5 mg total) by mouth daily. Annual appt due  in June must see provider for future refills (Patient not taking: Reported on 02/06/2018) 90 tablet 0   Facility-Administered Medications Prior to Visit  Medication Dose Route Frequency Provider Last Rate Last Dose  . aminophylline injection 75 mg  75 mg Intravenous BID PRN Dorothy Spark, MD   75 mg at 04/13/15 1037    ROS: Review of Systems  Constitutional: Negative for activity change, appetite change, chills, fatigue and unexpected weight change.  HENT: Negative for congestion, mouth sores and sinus pressure.   Eyes: Negative for visual disturbance.  Respiratory: Negative for cough and chest tightness.   Gastrointestinal: Negative for abdominal pain and nausea.  Genitourinary: Negative for difficulty urinating, frequency and vaginal pain.  Musculoskeletal: Negative for back pain and gait problem.  Skin: Negative for pallor and rash.  Neurological: Negative for dizziness, tremors, weakness, numbness and headaches.  Psychiatric/Behavioral: Negative for confusion, sleep disturbance and suicidal ideas. The patient is nervous/anxious.     Objective:  BP (!) 180/100 (BP Location: Left Arm, Patient Position: Sitting, Cuff Size: Large)   Pulse 74   Ht 5\' 9"  (1.753 m)   SpO2 98%   BMI 32.19 kg/m   BP Readings from Last 3 Encounters:  02/06/18 (!) 180/100  01/05/18 132/88  01/02/18 (!) 163/92    Wt Readings from Last  3 Encounters:  01/01/18 218 lb (98.9 kg)  10/24/17 219 lb (99.3 kg)  08/22/17 221 lb (100.2 kg)    Physical Exam  Constitutional: She appears well-developed. No distress.  HENT:  Head: Normocephalic.  Right Ear: External ear normal.  Left Ear: External ear normal.  Nose: Nose normal.  Mouth/Throat: Oropharynx is clear and moist.  Eyes: Pupils are equal, round, and reactive to light. Conjunctivae are normal. Right eye exhibits no discharge. Left eye exhibits no discharge.  Neck: Normal range of motion. Neck supple. No JVD present. No tracheal deviation  present. No thyromegaly present.  Cardiovascular: Normal rate, regular rhythm and normal heart sounds.  Pulmonary/Chest: No stridor. No respiratory distress. She has no wheezes.  Abdominal: Soft. Bowel sounds are normal. She exhibits no distension and no mass. There is no tenderness. There is no rebound and no guarding.  Musculoskeletal: She exhibits no edema or tenderness.  Lymphadenopathy:    She has no cervical adenopathy.  Neurological: She displays normal reflexes. No cranial nerve deficit. She exhibits normal muscle tone. Coordination normal.  Skin: No rash noted. No erythema.  Psychiatric: Her behavior is normal. Judgment and thought content normal.  obese  Lab Results  Component Value Date   WBC 8.9 01/05/2018   HGB 13.7 01/05/2018   HCT 40.2 01/05/2018   PLT 290.0 01/05/2018   GLUCOSE 151 (H) 01/05/2018   CHOL 163 11/01/2017   TRIG 86 11/01/2017   HDL 53 11/01/2017   LDLCALC 93 11/01/2017   ALT 35 (H) 11/01/2017   AST 26 11/01/2017   NA 138 01/05/2018   K 3.5 01/05/2018   CL 101 01/05/2018   CREATININE 1.03 01/05/2018   BUN 26 (H) 01/05/2018   CO2 28 01/05/2018   TSH 1.860 11/01/2017   INR 1.02 05/27/2015   HGBA1C 5.9 (H) 11/01/2017    No results found.  Assessment & Plan:   There are no diagnoses linked to this encounter.   No orders of the defined types were placed in this encounter.    Follow-up: No follow-ups on file.  Walker Kehr, MD

## 2018-02-06 NOTE — Assessment & Plan Note (Addendum)
Worse Toprol XL 1/2 tab qd 12.5 mg. Added Maxzide

## 2018-02-06 NOTE — Assessment & Plan Note (Signed)
Metformin Labs 

## 2018-02-08 ENCOUNTER — Telehealth: Payer: Self-pay | Admitting: Internal Medicine

## 2018-02-08 DIAGNOSIS — Z0279 Encounter for issue of other medical certificate: Secondary | ICD-10-CM

## 2018-02-08 NOTE — Telephone Encounter (Signed)
I have faxed this FMLA to the # below.

## 2018-02-08 NOTE — Telephone Encounter (Signed)
FMLA for patient has been completed & signed by provider.   Copy sent to scan &charged for.   Called patient to inform VM BOX is FULL unable to leave a message. Will send a mychart message to inform patient no fax number to fax these too and the original is ready to be picked up.

## 2018-03-07 ENCOUNTER — Other Ambulatory Visit: Payer: Self-pay

## 2018-03-07 ENCOUNTER — Ambulatory Visit: Payer: Federal, State, Local not specified - PPO | Admitting: Adult Health

## 2018-03-07 ENCOUNTER — Encounter: Payer: Self-pay | Admitting: Adult Health

## 2018-03-07 VITALS — BP 140/98 | HR 70 | Ht 67.5 in | Wt 226.4 lb

## 2018-03-07 DIAGNOSIS — I10 Essential (primary) hypertension: Secondary | ICD-10-CM | POA: Diagnosis not present

## 2018-03-07 DIAGNOSIS — R1031 Right lower quadrant pain: Secondary | ICD-10-CM

## 2018-03-07 NOTE — Progress Notes (Signed)
  Subjective:     Patient ID: Caroline Wong, female   DOB: 02/23/1961, 57 y.o.   MRN: [id number]  HPI  Caroline Wong is a 57 year old white female, married in complaining of weight gain and pressure in right side for 3 weeks. She has had her BP meds changed recently. She works for [employer] and is stressed.   Review of Systems Weight gain RLQ pressure, dull ache for about 3 weeks Has had some nausea and increased BMs, but feels this is side effect of metformin Reviewed past medical,surgical, social and family history. Reviewed medications and allergies.     Objective:   Physical Exam BP (!) 140/98 (BP Location: Right Arm, Cuff Size: Normal)   Pulse 70   Ht 5' 7.5" (1.715 m)   Wt 226 lb 6.4 oz (102.7 kg)   BMI 34.94 kg/m    Skin warm and dry.  Lungs: clear to ausculation bilaterally. Cardiovascular: regular rate and rhythm. Abdomen is soft and tender just below waist to RLQ, no rebound,noted. Will check labs and get [place] Keep BP log.If BP under better control will consider Qysmia for weight loss.  Assessment:     1. RLQ abdominal pain   2. Essential hypertension       Plan:     Check CBC and CMP Get GYN [place] in 1 week, will talk then F/U 11/5 with PCP on BP

## 2018-03-08 ENCOUNTER — Telehealth: Payer: Self-pay

## 2018-03-08 ENCOUNTER — Ambulatory Visit (INDEPENDENT_AMBULATORY_CARE_PROVIDER_SITE_OTHER): Payer: Federal, State, Local not specified - PPO

## 2018-03-08 DIAGNOSIS — R1031 Right lower quadrant pain: Secondary | ICD-10-CM

## 2018-03-08 LAB — CBC WITH DIFFERENTIAL/PLATELET
Basophils Absolute: 0.1 10*3/uL (ref 0.0–0.2)
Basos: 1 %
EOS (ABSOLUTE): 0.2 10*3/uL (ref 0.0–0.4)
Eos: 2 %
HEMOGLOBIN: 14.6 g/dL (ref 11.1–15.9)
Hematocrit: 42.3 % (ref 34.0–46.6)
IMMATURE GRANS (ABS): 0 10*3/uL (ref 0.0–0.1)
IMMATURE GRANULOCYTES: 0 %
LYMPHS: 28 %
Lymphocytes Absolute: 2.5 10*3/uL (ref 0.7–3.1)
MCH: 30.9 pg (ref 26.6–33.0)
MCHC: 34.5 g/dL (ref 31.5–35.7)
MCV: 90 fL (ref 79–97)
MONOCYTES: 9 %
Monocytes Absolute: 0.8 10*3/uL (ref 0.1–0.9)
NEUTROS PCT: 60 %
Neutrophils Absolute: 5.5 10*3/uL (ref 1.4–7.0)
Platelets: 339 10*3/uL (ref 150–450)
RBC: 4.72 x10E6/uL (ref 3.77–5.28)
RDW: 12.3 % (ref 12.3–15.4)
WBC: 9.1 10*3/uL (ref 3.4–10.8)

## 2018-03-08 LAB — COMPREHENSIVE METABOLIC PANEL
A/G RATIO: 1.6 (ref 1.2–2.2)
ALT: 54 IU/L — ABNORMAL HIGH (ref 0–32)
AST: 33 IU/L (ref 0–40)
Albumin: 4.5 g/dL (ref 3.5–5.5)
Alkaline Phosphatase: 85 IU/L (ref 39–117)
BUN/Creatinine Ratio: 25 — ABNORMAL HIGH (ref 9–23)
BUN: 21 mg/dL (ref 6–24)
Bilirubin Total: 0.3 mg/dL (ref 0.0–1.2)
CALCIUM: 9.5 mg/dL (ref 8.7–10.2)
CO2: 23 mmol/L (ref 20–29)
Chloride: 100 mmol/L (ref 96–106)
Creatinine, Ser: 0.83 mg/dL (ref 0.57–1.00)
GFR, EST AFRICAN AMERICAN: 92 mL/min/{1.73_m2} (ref >59)
GFR, EST NON AFRICAN AMERICAN: 80 mL/min/{1.73_m2} (ref >59)
GLOBULIN, TOTAL: 2.9 g/dL (ref 1.5–4.5)
Glucose: 136 mg/dL — ABNORMAL HIGH (ref 65–99)
POTASSIUM: 4.7 mmol/L (ref 3.5–5.2)
SODIUM: 139 mmol/L (ref 134–144)
TOTAL PROTEIN: 7.4 g/dL (ref 6.0–8.5)

## 2018-03-08 NOTE — Progress Notes (Signed)
PELVIC US TA/TV:Homogeneous anteverted uterus,wnl,EEC 4.9 mm,normal left ovary,complex right ovarian cyst 2.3 x 1.5 x 1.6 cm,no free fluid,some pelvic discomfort during ultrasound,difficult to slide ovaries because of location.

## 2018-03-08 NOTE — Telephone Encounter (Signed)
Copied from [place] [id number]. Topic: General - Other >> Mar 08, 2018 10:16 AM Keene Breath wrote: Reason for CRM: Patient called to inform the nurse or doctor that her husband has the flu and was prescribed Cama flu by the physician.  Patient would like a preventive for Cama flu so that she will not get the flu.  Patient uses [facility] [phone] - [place], [place] - [address] [phone] (Phone) [phone] (Fax).  Please advise and call patient back at [phone]

## 2018-03-09 ENCOUNTER — Telehealth: Payer: Self-pay | Admitting: Adult Health

## 2018-03-11 MED ORDER — OSELTAMIVIR PHOSPHATE 75 MG PO CAPS: 75.0000 mg | ORAL_CAPSULE | Freq: Every day | ORAL | 0 refills | Status: DC

## 2018-03-11 NOTE — Telephone Encounter (Signed)
I assume it is Tamiflu.  Will call in.  Thank you

## 2018-03-14 ENCOUNTER — Other Ambulatory Visit: Payer: Federal, State, Local not specified - PPO

## 2018-03-26 ENCOUNTER — Ambulatory Visit: Payer: Federal, State, Local not specified - PPO | Admitting: Internal Medicine

## 2018-03-28 DIAGNOSIS — K080 Exfoliation of teeth due to systemic causes: Secondary | ICD-10-CM | POA: Diagnosis not present

## 2018-04-10 ENCOUNTER — Ambulatory Visit: Payer: Federal, State, Local not specified - PPO | Admitting: Internal Medicine

## 2018-04-10 ENCOUNTER — Other Ambulatory Visit: Payer: Self-pay | Admitting: Adult Health

## 2018-04-10 DIAGNOSIS — N83201 Unspecified ovarian cyst, right side: Secondary | ICD-10-CM

## 2018-04-11 ENCOUNTER — Telehealth: Payer: Self-pay | Admitting: Adult Health

## 2018-04-11 ENCOUNTER — Encounter: Payer: Self-pay | Admitting: *Deleted

## 2018-04-11 ENCOUNTER — Ambulatory Visit (INDEPENDENT_AMBULATORY_CARE_PROVIDER_SITE_OTHER): Payer: Federal, State, Local not specified - PPO

## 2018-04-11 DIAGNOSIS — N83201 Unspecified ovarian cyst, right side: Secondary | ICD-10-CM | POA: Diagnosis not present

## 2018-04-11 NOTE — Progress Notes (Signed)
PELVIC US TA/TV: homogeneous anteverted uterus,wnl,thickened endometrium 5.8 mm,unable to visualize ovaries because of bowel gas,no free fluid,bilat pelvic pain during ultrasound

## 2018-04-11 NOTE — Telephone Encounter (Signed)
Mailbox is full.

## 2018-04-19 ENCOUNTER — Encounter: Payer: Self-pay | Admitting: *Deleted

## 2018-04-19 ENCOUNTER — Telehealth: Payer: Self-pay | Admitting: Women's Health

## 2018-04-19 NOTE — Telephone Encounter (Signed)
Attempted to call to discuss u/s and f/u. VM box full. Pt has read My[employer] message.  Roma Schanz, CNM, [facility] 04/19/2018 12:54 PM

## 2018-04-23 ENCOUNTER — Other Ambulatory Visit: Payer: Federal, State, Local not specified - PPO

## 2018-05-17 ENCOUNTER — Ambulatory Visit: Payer: Federal, State, Local not specified - PPO | Admitting: Internal Medicine

## 2018-05-22 ENCOUNTER — Ambulatory Visit: Payer: Federal, State, Local not specified - PPO | Admitting: Family Medicine

## 2018-05-22 ENCOUNTER — Encounter: Payer: Self-pay | Admitting: Family Medicine

## 2018-05-22 DIAGNOSIS — J069 Acute upper respiratory infection, unspecified: Secondary | ICD-10-CM | POA: Diagnosis not present

## 2018-05-22 MED ORDER — AZITHROMYCIN 250 MG PO TABS: ORAL_TABLET | ORAL | 0 refills | Status: DC

## 2018-05-22 NOTE — Progress Notes (Signed)
Caroline Wong - 57 y.o. female MRN [id number]  Date of birth: 1961-03-23  Subjective Chief Complaint  Patient presents with  . URI    ongoing for two days ago-non-productive. Sinus pressure/drainage. Denies fevers or body aches.     HPI Caroline Wong is a 57 y.o. female who complains of congestion, sore throat, nasal blockage, post nasal drip, dry cough and sinus pressure for 3 days. She denies a history of chest pain, chills, fatigue, fevers, myalgias, nausea, sweats, vomiting, wheezing and sputum production and denies a history of asthma. Patient does not smoke cigarettes.  She reports that she has had recurrent episodes of bronchitis that occur if symptoms of URI are not clearing up after a couple of days.  Z-pack has worked well for her in the past.  ROS:  A comprehensive ROS was completed and negative except as noted per HPI     Allergies  Allergen Reactions  . Bee Venom Swelling  . Biaxin [Clarithromycin] Swelling    Edema in joint Can take a Zpac ok  . Amlodipine     Edema, redness  . Betadine [Povidone Iodine] Rash  . Chloraprep One Step [Chlorhexidine Gluconate] Rash  . Duraprep [employer], Misc.] Rash  . Keflex [Cephalexin] Rash    Past Medical History:  Diagnosis Date  . Abdominal pain 11/25/2013   Has some nausea associated with pain that has been on and off x 2 weeks will get [place]  . Anxiety    Takes Lorazepam  . Chronic bronchitis ([place])   . Diabetes mellitus without complication ([place])   . Gallstones   . GERD (gastroesophageal reflux disease)   . Hormone replacement therapy (HRT)   . Hypertension   . Night sweats 04/14/2014  . Obesity 05/07/2014  . Urge incontinence 08/19/2015  . Yeast infection 05/07/2014    Past Surgical History:  Procedure Laterality Date  . ANTERIOR CERVICAL DECOMP/DISCECTOMY FUSION N/A 06/05/2015   Procedure: C5-6, C6-7 Anterior Cervical Discectomy and Fusion, Allograft, Plate;  Surgeon: [name], MD;  Location: [place];  Service: Orthopedics;  Laterality: N/A;  . ANTERIOR FUSION CERVICAL SPINE  06/05/2015   C 5 6 7    . CHOLECYSTECTOMY    . COLONOSCOPY    . FOOT SURGERY Left   . MOLE REMOVAL    . WISDOM TOOTH EXTRACTION      Social History   Socioeconomic History  . Marital status: Married    Spouse name: [name]  . Number of children: 4  . Years of education: college  . Highest education level: Not on file  Occupational History  . Occupation: Pensions consultant: [employer]  Social Needs  . Financial resource strain: Not on file  . Food insecurity:    Worry: Not on file    Inability: Not on file  . Transportation needs:    Medical: Not on file    Non-medical: Not on file  Tobacco Use  . Smoking status: Never Smoker  . Smokeless tobacco: Never Used  Substance and Sexual Activity  . Alcohol use: Yes    Alcohol/week: 0.0 standard drinks    Comment: red wine 3 times weekly  . Drug use: No  . Sexual activity: Yes    Birth control/protection: None  Lifestyle  . Physical activity:    Days per week: Not on file    Minutes per session: Not on file  . Stress: Not on file  Relationships  . Social connections:  Talks on phone: Not on file    Gets together: Not on file    Attends religious service: Not on file    Active member of club or organization: Not on file    Attends meetings of clubs or organizations: Not on file    Relationship status: Not on file  Other Topics Concern  . 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 Breast cancer Mother        breast  . Diabetes Mother   . Fibromyalgia Mother   . Heart disease Mother        CHF  . Hypertension Mother   . Kidney disease Mother   . Heart disease Father   . Emphysema Father   . Diabetes Brother   . Hypertension Brother        x 2   . Heart disease Maternal Grandmother   . Diabetes Maternal Grandmother   . Asthma Maternal Grandmother   . Arthritis Maternal Grandmother   . Heart disease Maternal Grandfather   . Arthritis Maternal Grandfather   . Cancer Maternal Grandfather        bladder  . Breast cancer Paternal Grandmother   . Heart disease Paternal Grandmother   . Arthritis Paternal Grandmother   . Stroke Paternal Grandmother   . Diabetes Paternal Grandfather   . Heart disease Paternal Grandfather   . Colon cancer Paternal Grandfather        mets  . Arthritis Paternal Grandfather   . Stomach cancer Paternal Grandfather        mets to stomach  . Hypertension Brother   . Esophageal cancer Neg Hx   . Pancreatic cancer Neg Hx   . Rectal cancer Neg Hx     Health Maintenance  Topic Date Due  . HIV Screening  04/10/1978  . INFLUENZA VACCINE  09/05/2018 (Originally 01/04/2018)  . MAMMOGRAM  08/22/2019  . PAP SMEAR-Modifier  10/24/2020  . COLONOSCOPY  03/03/2024  . TETANUS/TDAP  04/04/2025  . Hepatitis C Screening  Completed    ----------------------------------------------------------------------------------------------------------------------------------------------------------------------------------------------------------------- Physical Exam BP 132/74   Pulse 82   Temp 98.2 F (36.8 C) (Oral)   Ht 5' 7.5" (1.715 m)   SpO2 97%   BMI 34.94 kg/m   Physical Exam Constitutional:      Appearance: Normal appearance.  HENT:     Head: Normocephalic and atraumatic.     Right Ear: Tympanic membrane normal.     Left Ear: Tympanic membrane normal.     Nose: Congestion present.     Mouth/Throat:     Mouth: Mucous membranes are moist.  Eyes:     General: No scleral icterus. Neck:     Musculoskeletal: Normal range of motion and neck supple.  Cardiovascular:     Rate and Rhythm: Normal rate and regular rhythm.  Pulmonary:     Effort: Pulmonary effort is normal.     Breath sounds: Normal breath  sounds.  Skin:    General: Skin is warm and dry.  Neurological:     General: No focal deficit present.     Mental Status: She is alert.  Psychiatric:        Mood and Affect: Mood normal.        Behavior: Behavior normal.     ------------------------------------------------------------------------------------------------------------------------------------------------------------------------------------------------------------------- Assessment  and Plan  Upper respiratory tract infection  Symptomatic therapy suggested: push fluids, rest, use vaporizer or mist prn and return office visit prn if symptoms persist or worsen. Given history of similar episodes of this progressing to bronchitis I sent in Rx for zpack with instructions not to start unless symptoms are worsening or not improving after 7-10 days. . Call or return to clinic prn if these symptoms worsen or fail to improve as anticipated.

## 2018-05-22 NOTE — Patient Instructions (Signed)
Upper Respiratory Infection, Adult Most upper respiratory infections (URIs) are caused by a virus. A URI affects the nose, throat, and upper air passages. The most common type of URI is often called "the common cold." Follow these instructions at home:  Take medicines only as told by your doctor.  Gargle warm saltwater or take cough drops to comfort your throat as told by your doctor.  Use a warm mist humidifier or inhale steam from a shower to increase air moisture. This may make it easier to breathe.  Drink enough fluid to keep your pee (urine) clear or pale yellow.  Eat soups and other clear broths.  Have a healthy diet.  Rest as needed.  Go back to work when your fever is gone or your doctor says it is okay. ? You may need to stay home longer to avoid giving your URI to others. ? You can also wear a face mask and wash your hands often to prevent spread of the virus.  Use your inhaler more if you have asthma.  Do not use any tobacco products, including cigarettes, chewing tobacco, or electronic cigarettes. If you need help quitting, ask your doctor. Contact a doctor if:  You are getting worse, not better.  Your symptoms are not helped by medicine.  You have chills.  You are getting more short of breath.  You have brown or red mucus.  You have yellow or brown discharge from your nose.  You have pain in your face, especially when you bend forward.  You have a fever.  You have puffy (swollen) neck glands.  You have pain while swallowing.  You have white areas in the back of your throat. Get help right away if:  You have very bad or constant: ? Headache. ? Ear pain. ? Pain in your forehead, behind your eyes, and over your cheekbones (sinus pain). ? Chest pain.  You have long-lasting (chronic) lung disease and any of the following: ? Wheezing. ? Long-lasting cough. ? Coughing up blood. ? A change in your usual mucus.  You have a stiff neck.  You have  changes in your: ? Vision. ? Hearing. ? Thinking. ? Mood. This information is not intended to replace advice given to you by your health care provider. Make sure you discuss any questions you have with your health care provider. Document Released: 11/09/2007 Document Revised: 01/24/2016 Document Reviewed: 08/28/2013 Elsevier Interactive Patient Education  2018 Elsevier Inc.  

## 2018-05-22 NOTE — Assessment & Plan Note (Signed)
  Symptomatic therapy suggested: push fluids, rest, use vaporizer or mist prn and return office visit prn if symptoms persist or worsen. Given history of similar episodes of this progressing to bronchitis I sent in Rx for zpack with instructions not to start unless symptoms are worsening or not improving after 7-10 days. . Call or return to clinic prn if these symptoms worsen or fail to improve as anticipated.

## 2018-05-24 ENCOUNTER — Ambulatory Visit: Payer: Federal, State, Local not specified - PPO | Admitting: Internal Medicine

## 2018-06-11 ENCOUNTER — Other Ambulatory Visit: Payer: Self-pay | Admitting: Internal Medicine

## 2018-06-12 ENCOUNTER — Encounter: Payer: Self-pay | Admitting: Internal Medicine

## 2018-06-12 ENCOUNTER — Ambulatory Visit: Payer: Federal, State, Local not specified - PPO | Admitting: Internal Medicine

## 2018-06-12 DIAGNOSIS — J3089 Other allergic rhinitis: Secondary | ICD-10-CM | POA: Diagnosis not present

## 2018-06-12 DIAGNOSIS — R7309 Other abnormal glucose: Secondary | ICD-10-CM | POA: Diagnosis not present

## 2018-06-12 DIAGNOSIS — I10 Essential (primary) hypertension: Secondary | ICD-10-CM

## 2018-06-12 DIAGNOSIS — J309 Allergic rhinitis, unspecified: Secondary | ICD-10-CM | POA: Insufficient documentation

## 2018-06-12 DIAGNOSIS — M5440 Lumbago with sciatica, unspecified side: Secondary | ICD-10-CM

## 2018-06-12 DIAGNOSIS — G8929 Other chronic pain: Secondary | ICD-10-CM

## 2018-06-12 MED ORDER — PHENTERMINE HCL 37.5 MG PO TABS: 37.5000 mg | ORAL_TABLET | Freq: Every morning | ORAL | 2 refills | Status: DC

## 2018-06-12 MED ORDER — ONDANSETRON HCL 4 MG PO TABS: 4.0000 mg | ORAL_TABLET | Freq: Three times a day (TID) | ORAL | 0 refills | Status: DC | PRN

## 2018-06-12 NOTE — Assessment & Plan Note (Signed)
Doing well 

## 2018-06-12 NOTE — Progress Notes (Signed)
Subjective:  Patient ID: Caroline Wong, female    DOB: 09-19-1960  Age: 58 y.o. MRN: [id number]  CC: No chief complaint on file.   HPI Caroline Wong presents for  HTN, obesity f/u C/o occ nausea w/allergies   Outpatient Medications Prior to Visit  Medication Sig Dispense Refill  . Ascorbic Acid (VITAMIN C) 100 MG tablet Take 100 mg by mouth daily.    [name] azithromycin (ZITHROMAX) 250 MG tablet Take 2 tabs on day 1 followed by 1 tab on days 2-5 6 tablet 0  . bifidobacterium infantis (ALIGN) capsule Take 1 capsule by mouth daily.    . Cholecalciferol (VITAMIN D3) 5000 units TABS Take by mouth. Takes 2 daily    . cyclobenzaprine (FLEXERIL) 10 MG tablet Take 1 tablet (10 mg total) by mouth 3 (three) times daily as needed for muscle spasms. 60 tablet 0  . diphenhydrAMINE (BENADRYL) 25 MG tablet Take 2 tablets (50 mg total) by mouth every 4 (four) hours as needed (bee sting). 30 tablet 3  . LORazepam (ATIVAN) 0.5 MG tablet take 1 tablet by mouth at bedtime if needed 30 tablet 5  . metFORMIN (GLUCOPHAGE-XR) 500 MG 24 hr tablet 2 po bid 120 tablet 11  . metoprolol succinate (TOPROL-XL) 50 MG 24 hr tablet Take 1 tablet (50 mg total) by mouth daily. Take with or immediately following a meal. 30 tablet 11  . olmesartan (BENICAR) 40 MG tablet Take 1 tablet (40 mg total) by mouth daily. 30 tablet 11  . phentermine (ADIPEX-P) 37.5 MG tablet Take 1 tablet (37.5 mg total) by mouth every morning. Do not take if BP is elevated 30 tablet 2  . pyridOXINE (VITAMIN B-6) 100 MG tablet Take 100 mg by mouth daily.    [name] Specialty Vitamins Products (MENOPAUSE SUPPORT) TABS Take by mouth.    . traMADol (ULTRAM) 50 MG tablet take 1 tablet by mouth every 6 hours if needed 60 tablet 1  . triamterene-hydrochlorothiazide (MAXZIDE-25) 37.5-25 MG tablet Take 1 tablet by mouth daily. 90 tablet 3  . valACYclovir (VALTREX) 1000 MG tablet take 1 tablet by mouth once daily 30 tablet PRN   Facility-Administered  Medications Prior to Visit  Medication Dose Route Frequency Provider Last Rate Last Dose  . aminophylline injection 75 mg  75 mg Intravenous BID PRN Dorothy Spark, MD   75 mg at 04/13/15 1037    ROS: Review of Systems  Constitutional: Negative.  Negative for activity change, appetite change, chills, diaphoresis, fatigue, fever and unexpected weight change.  HENT: Positive for congestion and postnasal drip. Negative for ear pain, facial swelling, hearing loss, mouth sores, nosebleeds, rhinorrhea, sinus pressure, sneezing, sore throat, tinnitus and trouble swallowing.   Eyes: Negative for pain, discharge, redness, itching and visual disturbance.  Respiratory: Negative for cough, chest tightness, shortness of breath, wheezing and stridor.   Cardiovascular: Negative for chest pain, palpitations and leg swelling.  Gastrointestinal: Positive for nausea. Negative for abdominal distention, anal bleeding, blood in stool, constipation, diarrhea and rectal pain.  Genitourinary: Negative for difficulty urinating, dysuria, flank pain, frequency, genital sores, hematuria, pelvic pain, urgency, vaginal bleeding and vaginal discharge.  Musculoskeletal: Negative for arthralgias, back pain, gait problem, joint swelling, neck pain and neck stiffness.  Skin: Negative.  Negative for rash.  Neurological: Negative for dizziness, tremors, seizures, syncope, speech difficulty, weakness, numbness and headaches.  Hematological: Negative for adenopathy. Does not bruise/bleed easily.  Psychiatric/Behavioral: Negative for behavioral problems, decreased concentration, dysphoric mood, sleep disturbance and suicidal  ideas. The patient is not nervous/anxious.     Objective:  BP 126/76 (BP Location: Left Arm, Patient Position: Sitting, Cuff Size: Large)   Pulse 74   Temp 98.2 F (36.8 C) (Oral)   Ht 5' 7.5" (1.715 m)   SpO2 98%   BMI 34.94 kg/m   BP Readings from Last 3 Encounters:  06/12/18 126/76  05/22/18  132/74  03/07/18 (!) 140/98    Wt Readings from Last 3 Encounters:  03/07/18 226 lb 6.4 oz (102.7 kg)  01/01/18 218 lb (98.9 kg)  10/24/17 219 lb (99.3 kg)    Physical Exam Constitutional:      General: She is not in acute distress.    Appearance: She is well-developed.  HENT:     Head: Normocephalic.     Right Ear: External ear normal.     Left Ear: External ear normal.     Nose: Nose normal.  Eyes:     General:        Right eye: No discharge.        Left eye: No discharge.     Conjunctiva/sclera: Conjunctivae normal.     Pupils: Pupils are equal, round, and reactive to light.  Neck:     Musculoskeletal: Normal range of motion and neck supple.     Thyroid: No thyromegaly.     Vascular: No JVD.     Trachea: No tracheal deviation.  Cardiovascular:     Rate and Rhythm: Normal rate and regular rhythm.     Heart sounds: Normal heart sounds.  Pulmonary:     Effort: No respiratory distress.     Breath sounds: No stridor. No wheezing.  Abdominal:     General: Bowel sounds are normal. There is no distension.     Palpations: Abdomen is soft. There is no mass.     Tenderness: There is no abdominal tenderness. There is no guarding or rebound.  Musculoskeletal:        General: No tenderness.  Lymphadenopathy:     Cervical: No cervical adenopathy.  Skin:    Findings: No erythema or rash.  Neurological:     Cranial Nerves: No cranial nerve deficit.     Motor: No abnormal muscle tone.     Coordination: Coordination normal.     Deep Tendon Reflexes: Reflexes normal.  Psychiatric:        Behavior: Behavior normal.        Thought Content: Thought content normal.        Judgment: Judgment normal.   obese  Lab Results  Component Value Date   WBC 9.1 03/07/2018   HGB 14.6 03/07/2018   HCT 42.3 03/07/2018   PLT 339 03/07/2018   GLUCOSE 136 (H) 03/07/2018   CHOL 163 11/01/2017   TRIG 86 11/01/2017   HDL 53 11/01/2017   LDLCALC 93 11/01/2017   ALT 54 (H) 03/07/2018   AST  33 03/07/2018   NA 139 03/07/2018   K 4.7 03/07/2018   CL 100 03/07/2018   CREATININE 0.83 03/07/2018   BUN 21 03/07/2018   CO2 23 03/07/2018   TSH 1.860 11/01/2017   INR 0.9 02/06/2018   HGBA1C 6.1 02/06/2018    No results found.  Assessment & Plan:   There are no diagnoses linked to this encounter.   No orders of the defined types were placed in this encounter.    Follow-up: No follow-ups on file.  Walker Kehr, MD

## 2018-06-12 NOTE — Assessment & Plan Note (Signed)
Phentermine   Potential benefits of a long term phentermine  use as well as potential risks  and complications were explained to the patient and were aknowledged. 

## 2018-06-12 NOTE — Assessment & Plan Note (Signed)
Labs Wt loss Metformin

## 2018-06-12 NOTE — Assessment & Plan Note (Signed)
Claritin Zofran prn nausea

## 2018-06-12 NOTE — Assessment & Plan Note (Addendum)
  Olmesartan, Toprol XL 1/2 tab qd 12.5 mg, Maxzide

## 2018-06-21 ENCOUNTER — Ambulatory Visit (INDEPENDENT_AMBULATORY_CARE_PROVIDER_SITE_OTHER): Payer: Federal, State, Local not specified - PPO | Admitting: Orthopaedic Surgery

## 2018-06-21 ENCOUNTER — Ambulatory Visit (INDEPENDENT_AMBULATORY_CARE_PROVIDER_SITE_OTHER): Payer: Self-pay

## 2018-06-21 ENCOUNTER — Encounter (INDEPENDENT_AMBULATORY_CARE_PROVIDER_SITE_OTHER): Payer: Self-pay | Admitting: Orthopaedic Surgery

## 2018-06-21 VITALS — BP 135/92 | HR 81 | Ht 69.0 in | Wt 224.0 lb

## 2018-06-21 DIAGNOSIS — Z981 Arthrodesis status: Secondary | ICD-10-CM

## 2018-06-21 DIAGNOSIS — M67431 Ganglion, right wrist: Secondary | ICD-10-CM | POA: Diagnosis not present

## 2018-06-21 DIAGNOSIS — M25531 Pain in right wrist: Secondary | ICD-10-CM | POA: Diagnosis not present

## 2018-06-22 ENCOUNTER — Encounter (INDEPENDENT_AMBULATORY_CARE_PROVIDER_SITE_OTHER): Payer: Self-pay | Admitting: Orthopaedic Surgery

## 2018-06-22 NOTE — Progress Notes (Signed)
Office Visit Note   Patient: Caroline Wong           Date of Birth: 07-17-1960           MRN: [id number] Visit Date: 06/21/2018              Requested by: Cassandria Anger, MD [place], [place] [address] PCP: Cassandria Anger, MD   Assessment & Plan: Visit Diagnoses:  1. Pain in right wrist   2. History of fusion of cervical spine   3. Ganglion cyst of volar aspect of right wrist   4. S/P cervical spinal fusion     Plan: Cervical fusion is completely healed.  No adjacent level problems.  Right volar wrist ganglion is tender and is failed treatment with anti-inflammatories and splinting.  She like to proceed with volar wrist ganglion excision.  We discussed potential injury to the radial artery with surgery.  Possibility of recurrence of the ganglion.  Wrist surgery was discussed questions were elicited and answered she understands request to proceed.  Follow-Up Instructions: No follow-ups on file.   Orders:  Orders Placed This Encounter  Procedures  . XR Wrist 2 Views Right  . XR Cervical Spine 2 or 3 views   No orders of the defined types were placed in this encounter.     Procedures: No procedures performed   Clinical Data: No additional findings.   Subjective: Chief Complaint  Patient presents with  . Right Wrist - Pain  . Neck - Follow-up    HPI 58 year old female returns with painful right volar wrist ganglion that is been enlarging.  She has been using a splint for activities.  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  She is noted some tingling in her hand but not consistently.  No numbness.  Hands that wake her up at night.  She is noticed occasional popping in her neck with rotation but good relief from her previous cervical fusion 2016 at C5-6 and C6-7.  Review of Systems 14 point review of systems updated unchanged  from December 2016 other than as mentioned in HPI.  Of note is hypertension, cervical fusion 2016,   Objective: Vital Signs: BP (!) 135/92   Pulse 81   Ht 5\' 9"  (1.753 m)   Wt 224 lb (101.6 kg)   BMI 33.08 kg/m   Physical Exam Constitutional:      Appearance: She is well-developed.  HENT:     Head: Normocephalic.     Right Ear: External ear normal.     Left Ear: External ear normal.  Eyes:     Pupils: Pupils are equal, round, and reactive to light.  Neck:     Thyroid: No thyromegaly.     Trachea: No tracheal deviation.  Cardiovascular:     Rate and Rhythm: Normal rate.  Pulmonary:     Effort: Pulmonary effort is normal.  Abdominal:     Palpations: Abdomen is soft.  Skin:    General: Skin is warm and dry.  Neurological:     Mental Status: She is alert and oriented to person, place, and time.  Psychiatric:        Behavior: Behavior normal.     Ortho Exam well-healed cervical incision no brachial plexus tenderness.  Good flexion-extension cervical spine no impingement of the shoulders.  Right volar wrist shows a ganglion adjacent  to the FCR near the wrist crease which is painful with palpation.  Allen's test at the wrist shows normal radial and ulnar artery perfusion.  Sensation of the hand is intact negative Phalen's negative Tinel's right and left.  Normal lower extremity reflexes.  Normal heel toe gait.  Specialty Comments:  No specialty comments available.  Imaging: Xr Cervical Spine 2 Or 3 Views  Result Date: 06/22/2018 AP and lateral C-spine x-rays are obtained and reviewed.  This shows solid fusion at C5-6 C6-7 with plate and allograft well incorporated.  No adjacent level changes.  Normal cervical curvature. Impression: Previous C5-6, C6-7 2 level fusion solid without adjacent level changes.  Xr Wrist 2 Views Right  Result Date: 06/22/2018 AP and lateral right wrist x-rays are obtained and reviewed.  This shows normal bony architecture negative for fracture or  subluxation. Impression: Normal right wrist x-rays.    PMFS History: Patient Active Problem List   Diagnosis Date Noted  . Allergic rhinitis 06/12/2018  . Panic attacks 02/06/2018  . Elevated glucose 02/06/2018  . Petechial rash 01/05/2018  . Epistaxis 01/05/2018  . Screening for colorectal cancer 10/24/2017  . Encounter for gynecological examination with Papanicolaou smear of cervix 10/24/2017  . Cough 06/12/2017  . Upper respiratory tract infection 06/12/2017  . Anxiety 11/14/2016  . UTI (urinary tract infection) 11/11/2015  . Diarrhea 11/11/2015  . GERD (gastroesophageal reflux disease) 10/23/2015  . BP check 09/24/2015  . Urge incontinence 08/19/2015  . Stye external 07/15/2015  . S/P cervical spinal fusion 06/05/2015  . Acute sinusitis 05/20/2015  . Fatigue 05/13/2015  . Edema 04/01/2015  . Chest pain 04/01/2015  . Nonspecific abnormal electrocardiogram (ECG) (EKG) 04/01/2015  . 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 Neck pain, bilateral posterior 12/31/2014  . Lower back pain 12/31/2014  . Yeast infection 05/07/2014  . Obesity 05/07/2014  . Night sweats 04/14/2014  . Abdominal pain 11/25/2013  . Hormone replacement therapy (HRT) 08/21/2013  . Well adult exam 05/16/2013  . Hypertension    Past Medical History:  Diagnosis Date  . Abdominal pain 11/25/2013   Has some nausea associated with pain that has been on and off x 2 weeks will get [place]  . Anxiety    Takes Lorazepam  . Chronic bronchitis ([facility])   . Diabetes mellitus without complication ([facility])   . Gallstones   . GERD (gastroesophageal reflux disease)   . Hormone replacement therapy (HRT)   . Hypertension   . Night sweats 04/14/2014  . Obesity 05/07/2014  . Urge incontinence 08/19/2015  . Yeast infection 05/07/2014    Family History  Problem Relation Age of Onset  . Breast cancer Mother        breast  . Diabetes Mother   . Fibromyalgia Mother   . Heart disease Mother        CHF  . Hypertension  Mother   . Kidney disease Mother   . Heart disease Father   . Emphysema Father   . Diabetes Brother   . Hypertension Brother        x 2  . Heart disease Maternal Grandmother   . Diabetes Maternal Grandmother   . Asthma Maternal Grandmother   . Arthritis Maternal Grandmother   . Heart disease Maternal Grandfather   . Arthritis Maternal Grandfather   . Cancer Maternal Grandfather        bladder  . Breast cancer Paternal Grandmother   . Heart disease Paternal Grandmother   . Arthritis Paternal Grandmother   .  Stroke Paternal Grandmother   . Diabetes Paternal Grandfather   . Heart disease Paternal Grandfather   . Colon cancer Paternal Grandfather        mets  . Arthritis Paternal Grandfather   . Stomach cancer Paternal Grandfather        mets to stomach  . Hypertension Brother   . Esophageal cancer Neg Hx   . Pancreatic cancer Neg Hx   . Rectal cancer Neg Hx     Past Surgical History:  Procedure Laterality Date  . ANTERIOR CERVICAL DECOMP/DISCECTOMY FUSION N/A 06/05/2015   Procedure: C5-6, C6-7 Anterior Cervical Discectomy and Fusion, Allograft, Plate;  Surgeon: [name], MD;  Location: [facility];  Service: Orthopedics;  Laterality: N/A;  . ANTERIOR FUSION CERVICAL SPINE  06/05/2015   C 5 6 7    . CHOLECYSTECTOMY    . COLONOSCOPY    . FOOT SURGERY Left   . MOLE REMOVAL    . WISDOM TOOTH EXTRACTION     Social History   Occupational History  . 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 Smoking status: Never Smoker  . Smokeless tobacco: Never Used  Substance and Sexual Activity  . Alcohol use: Yes    Alcohol/week: 0.0 standard drinks    Comment: red wine 3 times weekly  . Drug use: No  . Sexual activity: Yes    Birth control/protection: None

## 2018-06-29 ENCOUNTER — Other Ambulatory Visit: Payer: Self-pay

## 2018-06-29 ENCOUNTER — Encounter (HOSPITAL_BASED_OUTPATIENT_CLINIC_OR_DEPARTMENT_OTHER): Payer: Self-pay | Admitting: *Deleted

## 2018-07-04 ENCOUNTER — Encounter (HOSPITAL_BASED_OUTPATIENT_CLINIC_OR_DEPARTMENT_OTHER)
Admission: RE | Admit: 2018-07-04 | Discharge: 2018-07-04 | Disposition: A | Discharge status: Home / Self Care | Payer: Federal, State, Local not specified - PPO | Source: Ambulatory Visit | Attending: Orthopaedic Surgery | Admitting: Orthopaedic Surgery

## 2018-07-04 DIAGNOSIS — Z01818 Encounter for other preprocedural examination: Secondary | ICD-10-CM | POA: Diagnosis not present

## 2018-07-04 LAB — BASIC METABOLIC PANEL
Anion gap: 8 (ref 5–15)
BUN: 16 mg/dL (ref 6–20)
CO2: 27 mmol/L (ref 22–32)
Calcium: 9.3 mg/dL (ref 8.9–10.3)
Chloride: 103 mmol/L (ref 98–111)
Creatinine, Ser: 0.91 mg/dL (ref 0.44–1.00)
GFR calc non Af Amer: >60 mL/min (ref >60)
Glucose, Bld: 143 mg/dL — ABNORMAL HIGH (ref 70–99)
Potassium: 4.2 mmol/L (ref 3.5–5.1)
SODIUM: 138 mmol/L (ref 135–145)

## 2018-07-05 NOTE — Progress Notes (Signed)
EKG reviewed by Dr. Carignan, will proceed with surgery as scheduled. 

## 2018-07-06 ENCOUNTER — Telehealth (INDEPENDENT_AMBULATORY_CARE_PROVIDER_SITE_OTHER): Payer: Self-pay | Admitting: Radiology

## 2018-07-06 NOTE — Telephone Encounter (Signed)
Patient brought FMLA paperwork to the [facility] office to be completed and faxed. Paperwork completed and faxed to [phone] per patient request. A copy of paperwork will be available at [facility] office for patient to pick up.

## 2018-07-09 ENCOUNTER — Other Ambulatory Visit: Payer: Self-pay

## 2018-07-09 ENCOUNTER — Encounter (HOSPITAL_BASED_OUTPATIENT_CLINIC_OR_DEPARTMENT_OTHER)
Admission: RE | Disposition: A | Discharge status: Home / Self Care | Payer: Self-pay | Source: Home / Self Care | Attending: Orthopaedic Surgery

## 2018-07-09 ENCOUNTER — Ambulatory Visit (HOSPITAL_BASED_OUTPATIENT_CLINIC_OR_DEPARTMENT_OTHER): Payer: Federal, State, Local not specified - PPO | Admitting: Anesthesiology

## 2018-07-09 ENCOUNTER — Encounter (HOSPITAL_BASED_OUTPATIENT_CLINIC_OR_DEPARTMENT_OTHER): Payer: Self-pay | Admitting: Certified Registered"

## 2018-07-09 ENCOUNTER — Ambulatory Visit (HOSPITAL_BASED_OUTPATIENT_CLINIC_OR_DEPARTMENT_OTHER)
Admission: RE | Admit: 2018-07-09 | Discharge: 2018-07-09 | Disposition: A | Discharge status: Home / Self Care | Payer: Federal, State, Local not specified - PPO | Attending: Orthopaedic Surgery | Admitting: Orthopaedic Surgery

## 2018-07-09 DIAGNOSIS — F419 Anxiety disorder, unspecified: Secondary | ICD-10-CM | POA: Insufficient documentation

## 2018-07-09 DIAGNOSIS — Z7984 Long term (current) use of oral hypoglycemic drugs: Secondary | ICD-10-CM | POA: Insufficient documentation

## 2018-07-09 DIAGNOSIS — Z79899 Other long term (current) drug therapy: Secondary | ICD-10-CM | POA: Diagnosis not present

## 2018-07-09 DIAGNOSIS — E119 Type 2 diabetes mellitus without complications: Secondary | ICD-10-CM | POA: Insufficient documentation

## 2018-07-09 DIAGNOSIS — K219 Gastro-esophageal reflux disease without esophagitis: Secondary | ICD-10-CM | POA: Diagnosis not present

## 2018-07-09 DIAGNOSIS — Z981 Arthrodesis status: Secondary | ICD-10-CM | POA: Diagnosis not present

## 2018-07-09 DIAGNOSIS — M67431 Ganglion, right wrist: Secondary | ICD-10-CM

## 2018-07-09 DIAGNOSIS — Z8249 Family history of ischemic heart disease and other diseases of the circulatory system: Secondary | ICD-10-CM | POA: Insufficient documentation

## 2018-07-09 DIAGNOSIS — I10 Essential (primary) hypertension: Secondary | ICD-10-CM | POA: Insufficient documentation

## 2018-07-09 HISTORY — PX: GANGLION CYST EXCISION: SHX1691

## 2018-07-09 SURGERY — EXCISION, GANGLION CYST, WRIST
Anesthesia: General | Site: Wrist | Laterality: Right

## 2018-07-09 MED ORDER — SCOPOLAMINE 1 MG/3DAYS TD PT72: 1.0000 | MEDICATED_PATCH | Freq: Once | TRANSDERMAL | Status: DC | PRN

## 2018-07-09 MED ORDER — MIDAZOLAM HCL 2 MG/2ML IJ SOLN
1.0000 mg | INTRAMUSCULAR | Status: DC | PRN
  Administered 2018-07-09: 2 mg via INTRAVENOUS

## 2018-07-09 MED ORDER — DEXAMETHASONE SODIUM PHOSPHATE 10 MG/ML IJ SOLN
INTRAMUSCULAR | Status: DC | PRN
  Administered 2018-07-09: 10 mg via INTRAVENOUS

## 2018-07-09 MED ORDER — FENTANYL CITRATE (PF) 100 MCG/2ML IJ SOLN
50.0000 ug | INTRAMUSCULAR | Status: DC | PRN
  Administered 2018-07-09: 100 ug via INTRAVENOUS

## 2018-07-09 MED ORDER — ACETAMINOPHEN 500 MG PO TABS
1000.0000 mg | ORAL_TABLET | Freq: Once | ORAL | Status: AC
  Administered 2018-07-09: 1000 mg via ORAL

## 2018-07-09 MED ORDER — LACTATED RINGERS IV SOLN
INTRAVENOUS | Status: DC
  Administered 2018-07-09 (×2): via INTRAVENOUS

## 2018-07-09 MED ORDER — PROPOFOL 10 MG/ML IV BOLUS
INTRAVENOUS | Status: DC | PRN
  Administered 2018-07-09: 150 mg via INTRAVENOUS

## 2018-07-09 MED ORDER — VANCOMYCIN HCL IN DEXTROSE 1-5 GM/200ML-% IV SOLN
INTRAVENOUS | Status: AC
  Filled 2018-07-09: qty 200

## 2018-07-09 MED ORDER — BUPIVACAINE-EPINEPHRINE 0.25% -1:200000 IJ SOLN
INTRAMUSCULAR | Status: AC
  Filled 2018-07-09: qty 1

## 2018-07-09 MED ORDER — GABAPENTIN 300 MG PO CAPS
300.0000 mg | ORAL_CAPSULE | Freq: Once | ORAL | Status: AC
  Administered 2018-07-09: 300 mg via ORAL

## 2018-07-09 MED ORDER — VANCOMYCIN HCL IN DEXTROSE 1-5 GM/200ML-% IV SOLN
1000.0000 mg | INTRAVENOUS | Status: AC
  Administered 2018-07-09 (×2): 1000 mg via INTRAVENOUS

## 2018-07-09 MED ORDER — PROPOFOL 500 MG/50ML IV EMUL
INTRAVENOUS | Status: DC | PRN
  Administered 2018-07-09: 25 ug/kg/min via INTRAVENOUS

## 2018-07-09 MED ORDER — FENTANYL CITRATE (PF) 100 MCG/2ML IJ SOLN: 25.0000 ug | INTRAMUSCULAR | Status: DC | PRN

## 2018-07-09 MED ORDER — ACETAMINOPHEN 500 MG PO TABS
ORAL_TABLET | ORAL | Status: AC
  Filled 2018-07-09: qty 2

## 2018-07-09 MED ORDER — ONDANSETRON HCL 4 MG/2ML IJ SOLN: 4.0000 mg | Freq: Once | INTRAMUSCULAR | Status: DC | PRN

## 2018-07-09 MED ORDER — ONDANSETRON HCL 4 MG/2ML IJ SOLN
INTRAMUSCULAR | Status: DC | PRN
  Administered 2018-07-09 (×2): 4 mg via INTRAVENOUS

## 2018-07-09 MED ORDER — LIDOCAINE 2% (20 MG/ML) 5 ML SYRINGE
INTRAMUSCULAR | Status: DC | PRN
  Administered 2018-07-09: 100 mg via INTRAVENOUS

## 2018-07-09 MED ORDER — BUPIVACAINE HCL (PF) 0.25 % IJ SOLN
INTRAMUSCULAR | Status: AC
  Filled 2018-07-09: qty 30

## 2018-07-09 MED ORDER — HYDROCODONE-ACETAMINOPHEN 5-325 MG PO TABS: 1.0000 | ORAL_TABLET | Freq: Three times a day (TID) | ORAL | 0 refills | Status: DC | PRN

## 2018-07-09 MED ORDER — GABAPENTIN 300 MG PO CAPS
ORAL_CAPSULE | ORAL | Status: AC
  Filled 2018-07-09: qty 1

## 2018-07-09 MED ORDER — BUPIVACAINE HCL (PF) 0.5 % IJ SOLN
INTRAMUSCULAR | Status: DC | PRN
  Administered 2018-07-09: 5 mL

## 2018-07-09 MED ORDER — BUPIVACAINE HCL (PF) 0.5 % IJ SOLN
INTRAMUSCULAR | Status: AC
  Filled 2018-07-09: qty 30

## 2018-07-09 SURGICAL SUPPLY — 39 items
BANDAGE ACE 4X5 VEL STRL LF (GAUZE/BANDAGES/DRESSINGS) IMPLANT
BANDAGE COBAN STERILE 2 (GAUZE/BANDAGES/DRESSINGS) ×1 IMPLANT
BLADE SURG 15 STRL LF DISP TIS (BLADE) ×1 IMPLANT
BLADE SURG 15 STRL SS (BLADE) ×3
BNDG CMPR 9X4 STRL LF SNTH (GAUZE/BANDAGES/DRESSINGS) ×1
BNDG COHESIVE 3X5 TAN STRL LF (GAUZE/BANDAGES/DRESSINGS) ×2 IMPLANT
BNDG ESMARK 4X9 LF (GAUZE/BANDAGES/DRESSINGS) ×2 IMPLANT
CLOSURE WOUND 1/2 X4 (GAUZE/BANDAGES/DRESSINGS) ×1
CORD BIPOLAR FORCEPS 12FT (ELECTRODE) ×3 IMPLANT
COVER BACK TABLE 60X90IN (DRAPES) ×3 IMPLANT
COVER MAYO STAND STRL (DRAPES) ×3 IMPLANT
COVER WAND RF STERILE (DRAPES) IMPLANT
CUFF TOURNIQUET SINGLE 18IN (TOURNIQUET CUFF) ×3 IMPLANT
DECANTER SPIKE VIAL GLASS SM (MISCELLANEOUS) ×2 IMPLANT
DRAIN PENROSE 1/4X12 LTX STRL (WOUND CARE) ×3 IMPLANT
DRAPE EXTREMITY T 121X128X90 (DISPOSABLE) ×3 IMPLANT
DRAPE SURG 17X23 STRL (DRAPES) ×3 IMPLANT
DURAPREP 26ML APPLICATOR (WOUND CARE) ×3 IMPLANT
GAUZE SPONGE 4X4 12PLY STRL LF (GAUZE/BANDAGES/DRESSINGS) ×3 IMPLANT
GAUZE XEROFORM 1X8 LF (GAUZE/BANDAGES/DRESSINGS) ×3 IMPLANT
GLOVE BIOGEL PI IND STRL 8 (GLOVE) ×1 IMPLANT
GLOVE BIOGEL PI INDICATOR 8 (GLOVE) ×2
GLOVE ORTHO TXT STRL SZ7.5 (GLOVE) ×3 IMPLANT
GOWN STRL REUS W/ TWL LRG LVL3 (GOWN DISPOSABLE) ×2 IMPLANT
GOWN STRL REUS W/TWL LRG LVL3 (GOWN DISPOSABLE) ×6
NDL HYPO 25X1 1.5 SAFETY (NEEDLE) ×1 IMPLANT
NEEDLE HYPO 25X1 1.5 SAFETY (NEEDLE) ×3 IMPLANT
NS IRRIG 1000ML POUR BTL (IV SOLUTION) ×3 IMPLANT
PACK BASIN DAY SURGERY FS (CUSTOM PROCEDURE TRAY) ×3 IMPLANT
PADDING CAST ABS 4INX4YD NS (CAST SUPPLIES) ×2
PADDING CAST ABS COTTON 4X4 ST (CAST SUPPLIES) ×1 IMPLANT
STOCKINETTE 4X48 STRL (DRAPES) ×3 IMPLANT
STRIP CLOSURE SKIN 1/2X4 (GAUZE/BANDAGES/DRESSINGS) ×1 IMPLANT
SUT ETHILON 3 0 PS 1 (SUTURE) ×2 IMPLANT
SUT VICRYL 4-0 PS2 18IN ABS (SUTURE) ×4 IMPLANT
SYR BULB 3OZ (MISCELLANEOUS) ×3 IMPLANT
SYR CONTROL 10ML LL (SYRINGE) ×3 IMPLANT
TOWEL GREEN STERILE FF (TOWEL DISPOSABLE) ×6 IMPLANT
UNDERPAD 30X30 (UNDERPADS AND DIAPERS) ×3 IMPLANT

## 2018-07-09 NOTE — Op Note (Signed)
Preop diagnosis: Painful right volar wrist ganglion.  Postop diagnosis: Same  Procedure: Excision of right volar wrist ganglion.  Surgeon: Rodell Perna, MD  Assistant: Benjiman Core, PA-C important for the procedure and present for the entire procedure except for skin closure.  Anesthesia General LMA  Tourniquet time less than 20 minutes x 250  Procedure after prepping with DuraPrep patient has history of multiple prep allergies but her surgery for anterior cervical fusion which I did was done with DuraPrep and she did not have any problems.  She has had problems with DuraPrep ,ChloraPrep, and also Betadine in the past.  I discussed this with her preoperatively the plan would be DuraPrep to decrease her infection right and then after the procedure we cleaned it off with alcohol prior to application of Steri-Strips and dressing.  Sterile skin marker was used over the palpable 1 to 2 cm volar wrist ganglion which was directly over the radial artery.  Longitudinal incision was made following the path of the radial artery after marking with skin marker elevation of the hand with timeout procedure done followed by Esmarch wrapping and then inflation of the tourniquet.  No antibiotics were indicated.  Incision was made and radial artery stent 5 proximally followed distally at its branch where branch went down toward the snuffbox and several branch went over the top of the FCR.  FCR sheath was opened and there was no abnormalities within the FCR sheath.  Distal the bifurcation of the radial artery the multilobe cyst once the radial artery was free was excised and extended down adjacent to the radial aspect of the flexor carpi radialis sheath and down to the volar wrist joint.  Small was cut the volar wrist joint wrist was flex extend and there was no further ganglion fluid or tissue present.  Ganglion had typical thin appearance with jellylike fluid and was not sent for pathology since it was grossly typical  ganglion.  Irrigation with saline solution bipolar cautery was used throughout the case as well as loupe magnification and tourniquet was deflated prior to closure some additional small areas were controlled with bipolar cautery.  Radial artery had good pulse good capillary refill to the fingers normal Allen's test.  Subtenons tissue reapproximated with 4-0 Vicryl and then a 3-0 nylon suture was used for running closure with the loop in the middle.  Meticulous cleaning off with alcohol of the DuraPrep was performed followed by Steri-Strips without tincture benzoin due to her history of multiple allergies although tincture benzoin was used when her anterior cervical was performed and she did not have problems.  Tweezers 4 x 4's web roll and Coban was applied postoperatively patient tolerated procedure well outpatient surgery is appropriate for treatment of this condition office follow-up 1 to 2 weeks.

## 2018-07-09 NOTE — H&P (Signed)
Office Visit Note              Patient: Caroline Wong                                          Date of Birth: August 30, 1960                                                    MRN: [id number] Visit Date: 06/21/2018                                                                     Requested by: Cassandria Anger, MD [place], [place] [address] PCP: Cassandria Anger, MD   Assessment & Plan: Visit Diagnoses:  1. Pain in right wrist   2. History of fusion of cervical spine   3. Ganglion cyst of volar aspect of right wrist   4. S/P cervical spinal fusion     Plan: Cervical fusion is completely healed.  No adjacent level problems.  Right volar wrist ganglion is tender and is failed treatment with anti-inflammatories and splinting.  She like to proceed with volar wrist ganglion excision.  We discussed potential injury to the radial artery with surgery.  Possibility of recurrence of the ganglion.  Wrist surgery was discussed questions were elicited and answered she understands request to proceed.  Follow-Up Instructions: No follow-ups on file.   Orders:     Orders Placed This Encounter  Procedures  . XR Wrist 2 Views Right  . XR Cervical Spine 2 or 3 views   No orders of the defined types were placed in this encounter.     Procedures: No procedures performed   Clinical Data: No additional findings.   Subjective:    Chief Complaint  Patient presents with  . Right Wrist - Pain  . Neck - Follow-up    HPI 58 year old female returns with painful right volar wrist ganglion that is been enlarging.  She has been using a splint for activities.  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  She is noted some tingling in her hand but not consistently.  No numbness.  Hands that wake her up at night.  She is noticed occasional popping in  her neck with rotation but good relief from her previous cervical fusion 2016 at C5-6 and C6-7.  Review of Systems 14 point review of systems updated unchanged from December 2016 other than as mentioned in HPI.  Of note is hypertension, cervical fusion 2016,   Objective: Vital Signs: BP (!) 135/92   Pulse 81   Ht 5\' 9"  (1.753 m)   Wt 224 lb (101.6 kg)   BMI 33.08 kg/m   Physical Exam Constitutional:      Appearance: She is well-developed.  HENT:     Head: Normocephalic.     Right Ear: External ear normal.  Left Ear: External ear normal.  Eyes:     Pupils: Pupils are equal, round, and reactive to light.  Neck:     Thyroid: No thyromegaly.     Trachea: No tracheal deviation.  Cardiovascular:     Rate and Rhythm: Normal rate.  Pulmonary:     Effort: Pulmonary effort is normal.  Abdominal:     Palpations: Abdomen is soft.  Skin:    General: Skin is warm and dry.  Neurological:     Mental Status: She is alert and oriented to person, place, and time.  Psychiatric:        Behavior: Behavior normal.     Ortho Exam well-healed cervical incision no brachial plexus tenderness.  Good flexion-extension cervical spine no impingement of the shoulders.  Right volar wrist shows a ganglion adjacent to the FCR near the wrist crease which is painful with palpation.  Allen's test at the wrist shows normal radial and ulnar artery perfusion.  Sensation of the hand is intact negative Phalen's negative Tinel's right and left.  Normal lower extremity reflexes.  Normal heel toe gait.  Specialty Comments:  No specialty comments available.  Imaging: Xr Cervical Spine 2 Or 3 Views  Result Date: 06/22/2018 AP and lateral C-spine x-rays are obtained and reviewed.  This shows solid fusion at C5-6 C6-7 with plate and allograft well incorporated.  No adjacent level changes.  Normal cervical curvature. Impression: Previous C5-6, C6-7 2 level fusion solid without adjacent level  changes.  Xr Wrist 2 Views Right  Result Date: 06/22/2018 AP and lateral right wrist x-rays are obtained and reviewed.  This shows normal bony architecture negative for fracture or subluxation. Impression: Normal right wrist x-rays.    PMFS History:     Patient Active Problem List   Diagnosis Date Noted  . Allergic rhinitis 06/12/2018  . Panic attacks 02/06/2018  . Elevated glucose 02/06/2018  . Petechial rash 01/05/2018  . Epistaxis 01/05/2018  . Screening for colorectal cancer 10/24/2017  . Encounter for gynecological examination with Papanicolaou smear of cervix 10/24/2017  . Cough 06/12/2017  . Upper respiratory tract infection 06/12/2017  . Anxiety 11/14/2016  . UTI (urinary tract infection) 11/11/2015  . Diarrhea 11/11/2015  . GERD (gastroesophageal reflux disease) 10/23/2015  . BP check 09/24/2015  . Urge incontinence 08/19/2015  . Stye external 07/15/2015  . S/P cervical spinal fusion 06/05/2015  . Acute sinusitis 05/20/2015  . Fatigue 05/13/2015  . Edema 04/01/2015  . Chest pain 04/01/2015  . Nonspecific abnormal electrocardiogram (ECG) (EKG) 04/01/2015  . 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 Neck pain, bilateral posterior 12/31/2014  . Lower back pain 12/31/2014  . Yeast infection 05/07/2014  . Obesity 05/07/2014  . Night sweats 04/14/2014  . Abdominal pain 11/25/2013  . Hormone replacement therapy (HRT) 08/21/2013  . Well adult exam 05/16/2013  . Hypertension        Past Medical History:  Diagnosis Date  . Abdominal pain 11/25/2013   Has some nausea associated with pain that has been on and off x 2 weeks will get [place]  . Anxiety    Takes Lorazepam  . Chronic bronchitis ([facility])   . Diabetes mellitus without complication ([facility])   . Gallstones   . GERD (gastroesophageal reflux disease)   . Hormone replacement therapy (HRT)   . Hypertension   . Night sweats 04/14/2014  . Obesity 05/07/2014  . Urge incontinence 08/19/2015  . Yeast  infection 05/07/2014         Family History  Problem Relation Age of Onset  . Breast cancer Mother        breast  . Diabetes Mother   . Fibromyalgia Mother   . Heart disease Mother        CHF  . Hypertension Mother   . Kidney disease Mother   . Heart disease Father   . Emphysema Father   . Diabetes Brother   . Hypertension Brother        x 2  . Heart disease Maternal Grandmother   . Diabetes Maternal Grandmother   . Asthma Maternal Grandmother   . Arthritis Maternal Grandmother   . Heart disease Maternal Grandfather   . Arthritis Maternal Grandfather   . Cancer Maternal Grandfather        bladder  . Breast cancer Paternal Grandmother   . Heart disease Paternal Grandmother   . Arthritis Paternal Grandmother   . Stroke Paternal Grandmother   . Diabetes Paternal Grandfather   . Heart disease Paternal Grandfather   . Colon cancer Paternal Grandfather        mets  . Arthritis Paternal Grandfather   . Stomach cancer Paternal Grandfather        mets to stomach  . Hypertension Brother   . Esophageal cancer Neg Hx   . Pancreatic cancer Neg Hx   . Rectal cancer Neg Hx          Past Surgical History:  Procedure Laterality Date  . ANTERIOR CERVICAL DECOMP/DISCECTOMY FUSION N/A 06/05/2015   Procedure: C5-6, C6-7 Anterior Cervical Discectomy and Fusion, Allograft, Plate;  Surgeon: [name], MD;  Location: [facility];  Service: Orthopedics;  Laterality: N/A;  . ANTERIOR FUSION CERVICAL SPINE  06/05/2015   C 5 6 7    . CHOLECYSTECTOMY    . COLONOSCOPY    . FOOT SURGERY Left   . MOLE REMOVAL    . WISDOM TOOTH EXTRACTION     Social History        Occupational History  . 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 Smoking status: Never Smoker  . Smokeless tobacco: Never Used  Substance and Sexual Activity  . Alcohol use: Yes    Alcohol/week: 0.0 standard drinks    Comment: red  wine 3 times weekly  . Drug use: No  . Sexual activity: Yes    Birth control/protection: None

## 2018-07-09 NOTE — Discharge Instructions (Addendum)
Do not remove dressing or get wet!    Elevate hand above heart level as much as possible to decrease pain and swelling.     Post Anesthesia Home Care Instructions  Activity: Get plenty of rest for the remainder of the day. A responsible individual must stay with you for 24 hours following the procedure.  For the next 24 hours, DO NOT: -Drive a car -Paediatric nurse -Drink alcoholic beverages -Take any medication unless instructed by your physician -Make any legal decisions or sign important papers.  Meals: Start with liquid foods such as gelatin or soup. Progress to regular foods as tolerated. Avoid greasy, spicy, heavy foods. If nausea and/or vomiting occur, drink only clear liquids until the nausea and/or vomiting subsides. Call your physician if vomiting continues.  Special Instructions/Symptoms: Your throat may feel dry or sore from the anesthesia or the breathing tube placed in your throat during surgery. If this causes discomfort, gargle with warm salt water. The discomfort should disappear within 24 hours.  If you had a scopolamine patch placed behind your ear for the management of post- operative nausea and/or vomiting:  1. The medication in the patch is effective for 72 hours, after which it should be removed.  Wrap patch in a tissue and discard in the trash. Wash hands thoroughly with soap and water. 2. You may remove the patch earlier than 72 hours if you experience unpleasant side effects which may include dry mouth, dizziness or visual disturbances. 3. Avoid touching the patch. Wash your hands with soap and water after contact with the patch.

## 2018-07-09 NOTE — Interval H&P Note (Signed)
History and Physical Interval Note:  07/09/2018 12:02 PM  Caroline Wong  has presented today for surgery, with the diagnosis of PAINFUL RIGHT VOLAR WRIST GANGLION  The various methods of treatment have been discussed with the patient and family. After consideration of risks, benefits and other options for treatment, the patient has consented to  Procedure(s): EXCISION, RIGHT VOLAR WRIST GANGLION (Right) as a surgical intervention .  The patient's history has been reviewed, patient examined, no change in status, stable for surgery.  I have reviewed the patient's chart and labs.  Questions were answered to the patient's satisfaction.     [name]

## 2018-07-09 NOTE — OR Nursing (Signed)
No specimen sent per Dr. Lorin Mercy

## 2018-07-09 NOTE — Anesthesia Procedure Notes (Signed)
Procedure Name: LMA Insertion Date/Time: 07/09/2018 12:14 PM Performed by: Lyndee Leo, CRNA Pre-anesthesia Checklist: Patient identified, Emergency Drugs available, Suction available and Patient being monitored Patient Re-evaluated:Patient Re-evaluated prior to induction Oxygen Delivery Method: Circle system utilized Preoxygenation: Pre-oxygenation with 100% oxygen Induction Type: IV induction Ventilation: Mask ventilation without difficulty LMA: LMA inserted LMA Size: 4.0 Number of attempts: 1 Airway Equipment and Method: Bite block Placement Confirmation: positive ETCO2 Tube secured with: Tape Dental Injury: Teeth and Oropharynx as per pre-operative assessment

## 2018-07-09 NOTE — Anesthesia Preprocedure Evaluation (Addendum)
Anesthesia Evaluation  Patient identified by MRN, date of birth, ID band Patient awake    Reviewed: Allergy & Precautions, NPO status , Patient's Chart, lab work & pertinent test results, reviewed documented beta blocker date and time   Airway Mallampati: II  TM Distance: >3 FB Neck ROM: Full    Dental  (+) Teeth Intact, Dental Advisory Given, Chipped   Pulmonary neg pulmonary ROS,    Pulmonary exam normal breath sounds clear to auscultation       Cardiovascular hypertension, Pt. on home beta blockers and Pt. on medications Normal cardiovascular exam Rhythm:Regular Rate:Normal     Neuro/Psych PSYCHIATRIC DISORDERS Anxiety negative neurological ROS     GI/Hepatic Neg liver ROS, GERD  ,  Endo/Other  Obesity   Renal/GU negative Renal ROS     Musculoskeletal PAINFUL RIGHT VOLAR WRIST GANGLION   Abdominal   Peds  Hematology negative hematology ROS (+)   Anesthesia Other Findings Day of surgery medications reviewed with the patient.  Reproductive/Obstetrics                            Anesthesia Physical Anesthesia Plan  ASA: II  Anesthesia Plan: General   Post-op Pain Management:    Induction: Intravenous  PONV Risk Score and Plan: 3 and Midazolam, Dexamethasone and Ondansetron  Airway Management Planned: LMA  Additional Equipment:   Intra-op Plan:   Post-operative Plan: Extubation in OR  Informed Consent: I have reviewed the patients History and Physical, chart, labs and discussed the procedure including the risks, benefits and alternatives for the proposed anesthesia with the patient or authorized representative who has indicated his/her understanding and acceptance.     Dental advisory given  Plan Discussed with: CRNA  Anesthesia Plan Comments:         Anesthesia Quick Evaluation

## 2018-07-09 NOTE — Anesthesia Postprocedure Evaluation (Signed)
Anesthesia Post Note  Patient: Caroline Wong  Procedure(s) Performed: EXCISION, RIGHT VOLAR WRIST GANGLION (Right Wrist)     Patient location during evaluation: PACU Anesthesia Type: General Level of consciousness: awake and alert Pain management: pain level controlled Vital Signs Assessment: post-procedure vital signs reviewed and stable Respiratory status: spontaneous breathing, nonlabored ventilation and respiratory function stable Cardiovascular status: blood pressure returned to baseline and stable Postop Assessment: no apparent nausea or vomiting Anesthetic complications: no    Last Vitals:  Vitals:   07/09/18 1315 07/09/18 1330  BP: (!) 151/84 (!) 146/84  Pulse: 61 (!) 56  Resp: 17 16  Temp:    SpO2: 97% 94%    Last Pain:  Vitals:   07/09/18 1330  TempSrc:   PainSc: 1                  Catalina Gravel

## 2018-07-09 NOTE — Transfer of Care (Signed)
Immediate Anesthesia Transfer of Care Note  Patient: Caroline Wong  Procedure(s) Performed: EXCISION, RIGHT VOLAR WRIST GANGLION (Right Wrist)  Patient Location: PACU  Anesthesia Type:General  Level of Consciousness: awake, sedated and patient cooperative  Airway & Oxygen Therapy: Patient Spontanous Breathing  Post-op Assessment: Report given to RN and Post -op Vital signs reviewed and stable  Post vital signs: Reviewed and stable  Last Vitals:  Vitals Value Taken Time  BP    Temp    Pulse 63 07/09/2018  1:06 PM  Resp 15 07/09/2018  1:06 PM  SpO2 97 % 07/09/2018  1:06 PM  Vitals shown include unvalidated device data.  Last Pain:  Vitals:   07/09/18 0852  TempSrc: Oral  PainSc: 0-No pain         Complications: No apparent anesthesia complications

## 2018-07-10 ENCOUNTER — Encounter (HOSPITAL_BASED_OUTPATIENT_CLINIC_OR_DEPARTMENT_OTHER): Payer: Self-pay | Admitting: Orthopaedic Surgery

## 2018-07-10 DIAGNOSIS — M67431 Ganglion, right wrist: Secondary | ICD-10-CM

## 2018-07-14 ENCOUNTER — Other Ambulatory Visit: Payer: Self-pay | Admitting: Internal Medicine

## 2018-07-16 ENCOUNTER — Other Ambulatory Visit: Payer: Federal, State, Local not specified - PPO

## 2018-07-16 ENCOUNTER — Other Ambulatory Visit: Payer: Self-pay | Admitting: Obstetrics and Gynecology

## 2018-07-16 DIAGNOSIS — N83201 Unspecified ovarian cyst, right side: Secondary | ICD-10-CM

## 2018-07-16 DIAGNOSIS — R9389 Abnormal findings on diagnostic imaging of other specified body structures: Secondary | ICD-10-CM

## 2018-07-17 ENCOUNTER — Ambulatory Visit (INDEPENDENT_AMBULATORY_CARE_PROVIDER_SITE_OTHER): Payer: Federal, State, Local not specified - PPO

## 2018-07-17 DIAGNOSIS — R9389 Abnormal findings on diagnostic imaging of other specified body structures: Secondary | ICD-10-CM

## 2018-07-17 DIAGNOSIS — N83201 Unspecified ovarian cyst, right side: Secondary | ICD-10-CM

## 2018-07-17 NOTE — Progress Notes (Signed)
PELVIC US TA/TV: homogeneous anteverted uterus,wnl,homogeneous thickened endometrium,EEC 8 mm,unable to visualized ovaries because of bowel gas for the second time,no free fluid,no pain during ultrasound

## 2018-07-18 ENCOUNTER — Inpatient Hospital Stay (INDEPENDENT_AMBULATORY_CARE_PROVIDER_SITE_OTHER): Payer: Federal, State, Local not specified - PPO | Admitting: Surgery

## 2018-07-26 ENCOUNTER — Encounter (INDEPENDENT_AMBULATORY_CARE_PROVIDER_SITE_OTHER): Payer: Self-pay | Admitting: Orthopaedic Surgery

## 2018-07-26 ENCOUNTER — Ambulatory Visit (INDEPENDENT_AMBULATORY_CARE_PROVIDER_SITE_OTHER): Payer: Federal, State, Local not specified - PPO | Admitting: Orthopaedic Surgery

## 2018-07-26 VITALS — BP 117/84 | HR 80 | Ht 69.0 in | Wt 233.0 lb

## 2018-07-26 DIAGNOSIS — M67439 Ganglion, unspecified wrist: Secondary | ICD-10-CM

## 2018-07-26 NOTE — Progress Notes (Signed)
Post-Op Visit Note   Patient: Caroline Wong           Date of Birth: 06/21/1960           MRN: [id number] Visit Date: 07/26/2018 PCP: Cassandria Anger, MD   Assessment & Plan: Follow-up volar ganglion excision.  Subcuticular suture removed Steri-Strips applied.  Chief Complaint:  Chief Complaint  Patient presents with  . Right Wrist - Routine Post Op    07/09/2018 Excision right volar ganglion   Visit Diagnoses: Post volar ganglion excision right wrist.  Plan: Suture removed Steri-Strips applied.  She has a wrist splint at home she can use for the next few weeks.  She can take it off if she sitting down not active.  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  Follow-up here will be on an as-needed basis.  She will call if she has any questions slip given for work resumption on 07/30/2018  Follow-Up Instructions: Return in about 2 weeks (around 08/09/2018).   Orders:  No orders of the defined types were placed in this encounter.  No orders of the defined types were placed in this encounter.   Imaging: No results found.  PMFS History: Patient Active Problem List   Diagnosis Date Noted  . Ganglion cyst of volar aspect of right wrist   . Allergic rhinitis 06/12/2018  . Panic attacks 02/06/2018  . Elevated glucose 02/06/2018  . Petechial rash 01/05/2018  . Epistaxis 01/05/2018  . Screening for colorectal cancer 10/24/2017  . Encounter for gynecological examination with Papanicolaou smear of cervix 10/24/2017  . Cough 06/12/2017  . Upper respiratory tract infection 06/12/2017  . Anxiety 11/14/2016  . UTI (urinary tract infection) 11/11/2015  . Diarrhea 11/11/2015  . GERD (gastroesophageal reflux disease) 10/23/2015  . BP check 09/24/2015  . Urge incontinence 08/19/2015  . Stye external 07/15/2015  . S/P cervical spinal fusion 06/05/2015  . Acute sinusitis 05/20/2015  . Fatigue  05/13/2015  . Edema 04/01/2015  . Chest pain 04/01/2015  . Nonspecific abnormal electrocardiogram (ECG) (EKG) 04/01/2015  . 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 Neck pain, bilateral posterior 12/31/2014  . Lower back pain 12/31/2014  . Yeast infection 05/07/2014  . Obesity 05/07/2014  . Night sweats 04/14/2014  . Abdominal pain 11/25/2013  . Hormone replacement therapy (HRT) 08/21/2013  . Well adult exam 05/16/2013  . Hypertension    Past Medical History:  Diagnosis Date  . Abdominal pain 11/25/2013   Has some nausea associated with pain that has been on and off x 2 weeks will get [place]  . Anxiety    Takes Lorazepam  . Chronic bronchitis ([place])   . Gallstones   . GERD (gastroesophageal reflux disease)   . Hormone replacement therapy (HRT)   . Hypertension   . Night sweats 04/14/2014  . Obesity 05/07/2014  . Urge incontinence 08/19/2015  . Yeast infection 05/07/2014    Family History  Problem Relation Age of Onset  . Breast cancer Mother        breast  . Diabetes Mother   . Fibromyalgia Mother   . Heart disease Mother        CHF  . Hypertension Mother   . Kidney disease Mother   . Heart disease Father   . Emphysema Father   . Diabetes Brother   . Hypertension Brother  x 2  . Heart disease Maternal Grandmother   . Diabetes Maternal Grandmother   . Asthma Maternal Grandmother   . Arthritis Maternal Grandmother   . Heart disease Maternal Grandfather   . Arthritis Maternal Grandfather   . Cancer Maternal Grandfather        bladder  . Breast cancer Paternal Grandmother   . Heart disease Paternal Grandmother   . Arthritis Paternal Grandmother   . Stroke Paternal Grandmother   . Diabetes Paternal Grandfather   . Heart disease Paternal Grandfather   . Colon cancer Paternal Grandfather        mets  . Arthritis Paternal Grandfather   . Stomach cancer Paternal Grandfather        mets to stomach  . Hypertension Brother   . Esophageal cancer Neg Hx   .  Pancreatic cancer Neg Hx   . Rectal cancer Neg Hx     Past Surgical History:  Procedure Laterality Date  . ANTERIOR CERVICAL DECOMP/DISCECTOMY FUSION N/A 06/05/2015   Procedure: C5-6, C6-7 Anterior Cervical Discectomy and Fusion, Allograft, Plate;  Surgeon: [name], MD;  Location: [place];  Service: Orthopedics;  Laterality: N/A;  . ANTERIOR FUSION CERVICAL SPINE  06/05/2015   C 5 6 7    . CHOLECYSTECTOMY    . COLONOSCOPY    . FOOT SURGERY Left   . GANGLION CYST EXCISION Right 07/09/2018   Procedure: EXCISION, RIGHT VOLAR WRIST GANGLION;  Surgeon: [name], MD;  Location: [facility];  Service: Orthopedics;  Laterality: Right;  . MOLE REMOVAL    . WISDOM TOOTH EXTRACTION     Social History   Occupational History  . 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 Smoking status: Never Smoker  . Smokeless tobacco: Never Used  Substance and Sexual Activity  . Alcohol use: Yes    Alcohol/week: 0.0 standard drinks    Comment: red wine 3 times weekly  . Drug use: No  . Sexual activity: Yes    Birth control/protection: None, Post-menopausal

## 2018-07-27 ENCOUNTER — Encounter (INDEPENDENT_AMBULATORY_CARE_PROVIDER_SITE_OTHER): Payer: Self-pay | Admitting: Orthopaedic Surgery

## 2018-07-31 ENCOUNTER — Telehealth: Payer: Self-pay | Admitting: Adult Health

## 2018-07-31 NOTE — Telephone Encounter (Signed)
Pt saw [place] results on Mychart, still having some pain

## 2018-08-01 ENCOUNTER — Encounter (INDEPENDENT_AMBULATORY_CARE_PROVIDER_SITE_OTHER): Payer: Self-pay | Admitting: Orthopaedic Surgery

## 2018-08-02 ENCOUNTER — Telehealth (INDEPENDENT_AMBULATORY_CARE_PROVIDER_SITE_OTHER): Payer: Self-pay | Admitting: Orthopaedic Surgery

## 2018-08-02 NOTE — Telephone Encounter (Signed)
Returned call to patient got recording mailbox is full can not leave message at this time  Patient advised she need to schedule an appointment with Dr Lorin Mercy for incision check. Called work number no answer and no answering machine pickup      [phone]   Work [phone]

## 2018-08-03 ENCOUNTER — Encounter (INDEPENDENT_AMBULATORY_CARE_PROVIDER_SITE_OTHER): Payer: Self-pay | Admitting: Orthopaedic Surgery

## 2018-08-03 ENCOUNTER — Ambulatory Visit (INDEPENDENT_AMBULATORY_CARE_PROVIDER_SITE_OTHER): Payer: Federal, State, Local not specified - PPO | Admitting: Orthopaedic Surgery

## 2018-08-03 VITALS — Ht 69.0 in | Wt 233.0 lb

## 2018-08-03 DIAGNOSIS — M67431 Ganglion, right wrist: Secondary | ICD-10-CM

## 2018-08-03 NOTE — Progress Notes (Signed)
Post-Op Visit Note   Patient: Caroline Wong           Date of Birth: 10-29-1960           MRN: [id number] Visit Date: 08/03/2018 PCP: Cassandria Anger, MD   Assessment & Plan: Patient turns post volar ganglion excision one 63mm piece of nylon suture was still present near the central loop for septic or closure and this was removed.  Incision looks good she is using wrist splint intermittently.  Return PRN  Chief Complaint:  Chief Complaint  Patient presents with  . Right Wrist - Wound Check    07/09/2018 Excision Right Volar Wrist Ganglion   Visit Diagnoses:  1. Ganglion cyst of volar aspect of right wrist     Plan: Patient continue intermittent use of her wrist splint when she is active or working.  Return PRN.  Follow-Up Instructions: No follow-ups on file.   Orders:  No orders of the defined types were placed in this encounter.  No orders of the defined types were placed in this encounter.   Imaging: No results found.  PMFS History: Patient Active Problem List   Diagnosis Date Noted  . Ganglion cyst of volar aspect of right wrist   . Allergic rhinitis 06/12/2018  . Panic attacks 02/06/2018  . Elevated glucose 02/06/2018  . Petechial rash 01/05/2018  . Epistaxis 01/05/2018  . Screening for colorectal cancer 10/24/2017  . Encounter for gynecological examination with Papanicolaou smear of cervix 10/24/2017  . Cough 06/12/2017  . Upper respiratory tract infection 06/12/2017  . Anxiety 11/14/2016  . UTI (urinary tract infection) 11/11/2015  . Diarrhea 11/11/2015  . GERD (gastroesophageal reflux disease) 10/23/2015  . BP check 09/24/2015  . Urge incontinence 08/19/2015  . Stye external 07/15/2015  . S/P cervical spinal fusion 06/05/2015  . Acute sinusitis 05/20/2015  . Fatigue 05/13/2015  . Edema 04/01/2015  . Chest pain 04/01/2015  . Nonspecific abnormal electrocardiogram (ECG) (EKG) 04/01/2015  . 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 Neck pain,  bilateral posterior 12/31/2014  . Lower back pain 12/31/2014  . Yeast infection 05/07/2014  . Obesity 05/07/2014  . Night sweats 04/14/2014  . Abdominal pain 11/25/2013  . Hormone replacement therapy (HRT) 08/21/2013  . Well adult exam 05/16/2013  . Hypertension    Past Medical History:  Diagnosis Date  . Abdominal pain 11/25/2013   Has some nausea associated with pain that has been on and off x 2 weeks will get [place]  . Anxiety    Takes Lorazepam  . Chronic bronchitis ([place])   . Gallstones   . GERD (gastroesophageal reflux disease)   . Hormone replacement therapy (HRT)   . Hypertension   . Night sweats 04/14/2014  . Obesity 05/07/2014  . Urge incontinence 08/19/2015  . Yeast infection 05/07/2014    Family History  Problem Relation Age of Onset  . Breast cancer Mother        breast  . Diabetes Mother   . Fibromyalgia Mother   . Heart disease Mother        CHF  . Hypertension Mother   . Kidney disease Mother   . Heart disease Father   . Emphysema Father   . Diabetes Brother   . Hypertension Brother        x 2  . Heart disease Maternal Grandmother   . Diabetes Maternal Grandmother   . Asthma Maternal Grandmother   . Arthritis Maternal Grandmother   . Heart disease Maternal Grandfather   .  Arthritis Maternal Grandfather   . Cancer Maternal Grandfather        bladder  . Breast cancer Paternal Grandmother   . Heart disease Paternal Grandmother   . Arthritis Paternal Grandmother   . Stroke Paternal Grandmother   . Diabetes Paternal Grandfather   . Heart disease Paternal Grandfather   . Colon cancer Paternal Grandfather        mets  . Arthritis Paternal Grandfather   . Stomach cancer Paternal Grandfather        mets to stomach  . Hypertension Brother   . Esophageal cancer Neg Hx   . Pancreatic cancer Neg Hx   . Rectal cancer Neg Hx     Past Surgical History:  Procedure Laterality Date  . ANTERIOR CERVICAL DECOMP/DISCECTOMY FUSION N/A 06/05/2015   Procedure: C5-6,  C6-7 Anterior Cervical Discectomy and Fusion, Allograft, Plate;  Surgeon: [name], MD;  Location: [facility];  Service: Orthopedics;  Laterality: N/A;  . ANTERIOR FUSION CERVICAL SPINE  06/05/2015   C 5 6 7    . CHOLECYSTECTOMY    . COLONOSCOPY    . FOOT SURGERY Left   . GANGLION CYST EXCISION Right 07/09/2018   Procedure: EXCISION, RIGHT VOLAR WRIST GANGLION;  Surgeon: [name], MD;  Location: [facility];  Service: Orthopedics;  Laterality: Right;  . MOLE REMOVAL    . WISDOM TOOTH EXTRACTION     Social History   Occupational History  . 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 Smoking status: Never Smoker  . Smokeless tobacco: Never Used  Substance and Sexual Activity  . Alcohol use: Yes    Alcohol/week: 0.0 standard drinks    Comment: red wine 3 times weekly  . Drug use: No  . Sexual activity: Yes    Birth control/protection: None, Post-menopausal

## 2018-09-04 ENCOUNTER — Ambulatory Visit: Payer: Federal, State, Local not specified - PPO | Admitting: Internal Medicine

## 2018-09-04 ENCOUNTER — Other Ambulatory Visit: Payer: Self-pay

## 2018-09-04 ENCOUNTER — Encounter: Payer: Self-pay | Admitting: Internal Medicine

## 2018-09-04 ENCOUNTER — Other Ambulatory Visit (INDEPENDENT_AMBULATORY_CARE_PROVIDER_SITE_OTHER): Payer: Federal, State, Local not specified - PPO

## 2018-09-04 DIAGNOSIS — M5440 Lumbago with sciatica, unspecified side: Secondary | ICD-10-CM

## 2018-09-04 DIAGNOSIS — G8929 Other chronic pain: Secondary | ICD-10-CM | POA: Diagnosis not present

## 2018-09-04 DIAGNOSIS — R7309 Other abnormal glucose: Secondary | ICD-10-CM

## 2018-09-04 LAB — BASIC METABOLIC PANEL
BUN: 23 mg/dL (ref 6–23)
CHLORIDE: 101 meq/L (ref 96–112)
CO2: 26 mEq/L (ref 19–32)
CREATININE: 1.04 mg/dL (ref 0.40–1.20)
Calcium: 9.9 mg/dL (ref 8.4–10.5)
GFR: 54.94 mL/min — ABNORMAL LOW (ref >60.00)
Glucose, Bld: 112 mg/dL — ABNORMAL HIGH (ref 70–99)
Potassium: 3.7 mEq/L (ref 3.5–5.1)
Sodium: 137 mEq/L (ref 135–145)

## 2018-09-04 LAB — HEMOGLOBIN A1C: Hgb A1c MFr Bld: 6.7 % — ABNORMAL HIGH (ref 4.6–6.5)

## 2018-09-04 NOTE — Assessment & Plan Note (Signed)
Working full time

## 2018-09-04 NOTE — Assessment & Plan Note (Signed)
A1c

## 2018-09-04 NOTE — Progress Notes (Signed)
Subjective:  Patient ID: Caroline Wong, female    DOB: 1960/11/04  Age: 58 y.o. MRN: [id number]  CC: No chief complaint on file.   HPI Caroline Wong presents for HTN, elevated glucose, LBP f/u  Outpatient Medications Prior to Visit  Medication Sig Dispense Refill  . Ascorbic Acid (VITAMIN C) 100 MG tablet Take 100 mg by mouth daily.    . Cholecalciferol (VITAMIN D3) 5000 units TABS Take by mouth. Takes 2 daily    . cyclobenzaprine (FLEXERIL) 10 MG tablet Take 1 tablet (10 mg total) by mouth 3 (three) times daily as needed for muscle spasms. 60 tablet 0  . diphenhydrAMINE (BENADRYL) 25 MG tablet Take 2 tablets (50 mg total) by mouth every 4 (four) hours as needed (bee sting). 30 tablet 3  . HYDROcodone-acetaminophen (NORCO) 5-325 MG tablet Take 1 tablet by mouth every 8 (eight) hours as needed for moderate pain. 30 tablet 0  . LORazepam (ATIVAN) 0.5 MG tablet TAKE 1 TABLET BY MOUTH AT BEDTIME AS NEEDED 30 tablet 2  . metFORMIN (GLUCOPHAGE-XR) 500 MG 24 hr tablet 2 po bid (Patient taking differently: 2 po bid) 120 tablet 11  . metoprolol succinate (TOPROL-XL) 50 MG 24 hr tablet Take 1 tablet (50 mg total) by mouth daily. Take with or immediately following a meal. 30 tablet 11  . olmesartan (BENICAR) 40 MG tablet Take 1 tablet (40 mg total) by mouth daily. 30 tablet 11  . phentermine (ADIPEX-P) 37.5 MG tablet Take 1 tablet (37.5 mg total) by mouth every morning. Do not take if BP is elevated 30 tablet 2  . Specialty Vitamins Products (MENOPAUSE SUPPORT) TABS Take by mouth.    . triamterene-hydrochlorothiazide (MAXZIDE-25) 37.5-25 MG tablet Take 1 tablet by mouth daily. 90 tablet 3  . valACYclovir (VALTREX) 1000 MG tablet take 1 tablet by mouth once daily 30 tablet PRN  . ondansetron (ZOFRAN) 4 MG tablet Take 1 tablet (4 mg total) by mouth every 8 (eight) hours as needed for nausea or vomiting. 20 tablet 0   Facility-Administered Medications Prior to Visit  Medication Dose Route  Frequency Provider Last Rate Last Dose  . aminophylline injection 75 mg  75 mg Intravenous BID PRN Dorothy Spark, MD   75 mg at 04/13/15 1037    ROS: Review of Systems  Constitutional: Negative for activity change, appetite change, chills, fatigue and unexpected weight change.  HENT: Negative for congestion, mouth sores and sinus pressure.   Eyes: Negative for visual disturbance.  Respiratory: Negative for cough and chest tightness.   Gastrointestinal: Negative for abdominal pain and nausea.  Genitourinary: Negative for difficulty urinating, frequency and vaginal pain.  Musculoskeletal: Negative for back pain and gait problem.  Skin: Negative for pallor and rash.  Neurological: Negative for dizziness, tremors, weakness, numbness and headaches.  Psychiatric/Behavioral: Negative for confusion, sleep disturbance and suicidal ideas.    Objective:  BP 124/82 (BP Location: Left Arm, Patient Position: Sitting, Cuff Size: Large)   Pulse 71   Temp (!) 97.4 F (36.3 C) (Oral)   Ht 5\' 9"  (1.753 m)   Wt 231 lb (104.8 kg)   LMP 09/24/2017   SpO2 99%   BMI 34.11 kg/m   BP Readings from Last 3 Encounters:  09/04/18 124/82  07/26/18 117/84  07/09/18 (!) 173/97    Wt Readings from Last 3 Encounters:  09/04/18 231 lb (104.8 kg)  08/03/18 233 lb (105.7 kg)  07/26/18 233 lb (105.7 kg)    Physical Exam Constitutional:  General: She is not in acute distress.    Appearance: She is well-developed.  HENT:     Head: Normocephalic.     Right Ear: External ear normal.     Left Ear: External ear normal.     Nose: Nose normal.  Eyes:     General:        Right eye: No discharge.        Left eye: No discharge.     Conjunctiva/sclera: Conjunctivae normal.     Pupils: Pupils are equal, round, and reactive to light.  Neck:     Musculoskeletal: Normal range of motion and neck supple.     Thyroid: No thyromegaly.     Vascular: No JVD.     Trachea: No tracheal deviation.   Cardiovascular:     Rate and Rhythm: Normal rate and regular rhythm.     Heart sounds: Normal heart sounds.  Pulmonary:     Effort: No respiratory distress.     Breath sounds: No stridor. No wheezing.  Abdominal:     General: Bowel sounds are normal. There is no distension.     Palpations: Abdomen is soft. There is no mass.     Tenderness: There is no abdominal tenderness. There is no guarding or rebound.  Musculoskeletal:        General: No tenderness.  Lymphadenopathy:     Cervical: No cervical adenopathy.  Skin:    Findings: No erythema or rash.  Neurological:     Cranial Nerves: No cranial nerve deficit.     Motor: No abnormal muscle tone.     Coordination: Coordination normal.     Deep Tendon Reflexes: Reflexes normal.  Psychiatric:        Behavior: Behavior normal.        Thought Content: Thought content normal.        Judgment: Judgment normal.   obese  Lab Results  Component Value Date   WBC 9.1 03/07/2018   HGB 14.6 03/07/2018   HCT 42.3 03/07/2018   PLT 339 03/07/2018   GLUCOSE 143 (H) 07/04/2018   CHOL 163 11/01/2017   TRIG 86 11/01/2017   HDL 53 11/01/2017   LDLCALC 93 11/01/2017   ALT 54 (H) 03/07/2018   AST 33 03/07/2018   NA 138 07/04/2018   K 4.2 07/04/2018   CL 103 07/04/2018   CREATININE 0.91 07/04/2018   BUN 16 07/04/2018   CO2 27 07/04/2018   TSH 1.860 11/01/2017   INR 0.9 02/06/2018   HGBA1C 6.1 02/06/2018    No results found.  Assessment & Plan:   There are no diagnoses linked to this encounter.   No orders of the defined types were placed in this encounter.    Follow-up: No follow-ups on file.  Walker Kehr, MD

## 2018-09-12 ENCOUNTER — Telehealth: Payer: Self-pay

## 2018-09-12 NOTE — Telephone Encounter (Signed)
Wife notified.

## 2018-09-12 NOTE — Telephone Encounter (Signed)
Copied from [place] [id number]. Topic: General - Inquiry >> Sep 12, 2018  9:20 AM Virl Axe D wrote: Reason for CRM: Pt stated her husband Bobie Caris ([facility] patient MRN [id number]) visited a [employer] drive thru in [place] on this past Monday. The [employer] was closed down after pt visited due to a worker testing positive for Covid-19. Pt stated she and her husband do not have any symptoms but they do not know if they have been exposed to Covid-19. They would like to know what Dr. Alain Marion suggests and if Inez Catalina could return their call with is recommendation. Please advise. CB#[phone]

## 2018-09-12 NOTE — Telephone Encounter (Signed)
Monitor themselves  for COVID sx's x 2 weeks Thx

## 2018-09-20 ENCOUNTER — Ambulatory Visit: Payer: Federal, State, Local not specified - PPO | Admitting: Internal Medicine

## 2018-09-21 NOTE — Telephone Encounter (Signed)
Will forward to brittany to complete FMLA.Marland KitchenJohny Chess

## 2018-09-24 DIAGNOSIS — Z0279 Encounter for issue of other medical certificate: Secondary | ICD-10-CM

## 2018-09-24 NOTE — Telephone Encounter (Signed)
Forms have been completed &placed in providers box to sign.   Dates of leave are 4/7 to 09/28/2018

## 2018-09-24 NOTE — Telephone Encounter (Signed)
Forms have been signed, faxed, sent to [employer] for.   original mailed to patient.

## 2018-10-23 ENCOUNTER — Other Ambulatory Visit: Payer: Self-pay | Admitting: Internal Medicine

## 2018-10-24 NOTE — Telephone Encounter (Signed)
[place] Controlled Database Checked Last filled: Phentermine 09/22/18 # 30        Lorazepam 09/20/18 # 30 LOV w/you: 09/04/18 Next appt w/you: 03/12/19

## 2018-10-30 ENCOUNTER — Encounter: Payer: Self-pay | Admitting: Adult Health

## 2018-10-30 ENCOUNTER — Other Ambulatory Visit: Payer: Self-pay | Admitting: Adult Health

## 2018-10-30 DIAGNOSIS — Z1231 Encounter for screening mammogram for malignant neoplasm of breast: Secondary | ICD-10-CM | POA: Diagnosis not present

## 2018-11-13 ENCOUNTER — Encounter: Payer: Self-pay | Admitting: *Deleted

## 2018-11-14 ENCOUNTER — Ambulatory Visit (INDEPENDENT_AMBULATORY_CARE_PROVIDER_SITE_OTHER): Payer: Federal, State, Local not specified - PPO | Admitting: Adult Health

## 2018-11-14 ENCOUNTER — Other Ambulatory Visit: Payer: Self-pay

## 2018-11-14 ENCOUNTER — Encounter: Payer: Self-pay | Admitting: Adult Health

## 2018-11-14 VITALS — BP 164/87 | HR 69 | Ht 67.5 in | Wt 234.0 lb

## 2018-11-14 DIAGNOSIS — Z1212 Encounter for screening for malignant neoplasm of rectum: Secondary | ICD-10-CM | POA: Diagnosis not present

## 2018-11-14 DIAGNOSIS — Z1211 Encounter for screening for malignant neoplasm of colon: Secondary | ICD-10-CM | POA: Diagnosis not present

## 2018-11-14 DIAGNOSIS — Z01419 Encounter for gynecological examination (general) (routine) without abnormal findings: Secondary | ICD-10-CM | POA: Diagnosis not present

## 2018-11-14 LAB — HEMOCCULT GUIAC POC 1CARD (OFFICE): Fecal Occult Blood, POC: NEGATIVE

## 2018-11-14 NOTE — Progress Notes (Signed)
Patient ID: Caroline Wong, female   DOB: 07-Jan-1961, 58 y.o.   MRN: [id number] History of Present Illness: Caroline Wong is a 58 year old white female, married, PM, in for a well woman gyn exam, she had a normal pap with negative HPV 10/24/17.  PCP is Dr Thomasenia Bottoms.    Current Medications, Allergies, Past Medical History, Past Surgical History, Family History and Social History were reviewed in [employer] record.     Review of Systems: Patient denies any headaches, hearing loss, fatigue, blurred vision, shortness of breath, chest pain, abdominal pain, problems with bowel movements, urination, or intercourse.No mood swings. 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 Can't seem to lose weight, has been on adipex, and it is not working, was on metformin but had increased BMs with it.  Last A1c 6.7, for recheck this month.   Physical Exam:BP (!) 164/87 (BP Location: Left Arm, Patient Position: Sitting, Cuff Size: Normal)   Pulse 69   Ht 5' 7.5" (1.715 m)   Wt 234 lb (106.1 kg)   LMP 09/24/2017   BMI 36.11 kg/m  General:  Well developed, well nourished, no acute distress Skin:  Warm and dry Neck:  Midline trachea, normal thyroid, good ROM, no lymphadenopathy Lungs; Clear to auscultation bilaterally Breast:  No dominant palpable mass, retraction, or nipple discharge, has big bite right breast at 2 0'clock. Cardiovascular: Regular rate and rhythm Abdomen:  Soft, non tender, no hepatosplenomegaly Pelvic:  External genitalia is normal in appearance, no lesions.  The vagina is normal in appearance. Urethra has no lesions or masses. The cervix is smooth.  Uterus is felt to be normal size, shape, and contour.  No adnexal masses or tenderness noted.Bladder is non tender, no masses felt. Rectal: Good sphincter tone, no polyps, or hemorrhoids felt.  Hemoccult negative. Extremities/musculoskeletal:  No swelling or varicosities noted, no  clubbing or cyanosis Psych:  No mood changes, alert and cooperative,seems happy Fall risk is low. PHQ 2 score is 0. Examination chaperoned by Estill Bamberg Rash LPN.   Impression: 1. Encounter for well woman exam with routine gynecological exam   2. Screening for colorectal cancer       Plan: Labs with PCP  Mammogram yearly Physical in 1 year Pap in 2022.

## 2018-11-21 ENCOUNTER — Encounter (HOSPITAL_COMMUNITY): Payer: Self-pay

## 2018-11-21 ENCOUNTER — Emergency Department (HOSPITAL_COMMUNITY): Payer: Federal, State, Local not specified - PPO

## 2018-11-21 ENCOUNTER — Other Ambulatory Visit: Payer: Self-pay

## 2018-11-21 ENCOUNTER — Emergency Department (HOSPITAL_COMMUNITY)
Admission: EM | Admit: 2018-11-21 | Discharge: 2018-11-21 | Disposition: A | Discharge status: Home / Self Care | Payer: Federal, State, Local not specified - PPO | Attending: Emergency Medicine | Admitting: Emergency Medicine

## 2018-11-21 DIAGNOSIS — R197 Diarrhea, unspecified: Secondary | ICD-10-CM | POA: Diagnosis not present

## 2018-11-21 DIAGNOSIS — Z7984 Long term (current) use of oral hypoglycemic drugs: Secondary | ICD-10-CM | POA: Insufficient documentation

## 2018-11-21 DIAGNOSIS — Z20822 Contact with and (suspected) exposure to covid-19: Secondary | ICD-10-CM

## 2018-11-21 DIAGNOSIS — Z20828 Contact with and (suspected) exposure to other viral communicable diseases: Secondary | ICD-10-CM | POA: Diagnosis not present

## 2018-11-21 DIAGNOSIS — Z79899 Other long term (current) drug therapy: Secondary | ICD-10-CM | POA: Diagnosis not present

## 2018-11-21 DIAGNOSIS — M791 Myalgia, unspecified site: Secondary | ICD-10-CM | POA: Diagnosis not present

## 2018-11-21 DIAGNOSIS — K529 Noninfective gastroenteritis and colitis, unspecified: Secondary | ICD-10-CM | POA: Diagnosis not present

## 2018-11-21 DIAGNOSIS — I10 Essential (primary) hypertension: Secondary | ICD-10-CM | POA: Insufficient documentation

## 2018-11-21 DIAGNOSIS — K760 Fatty (change of) liver, not elsewhere classified: Secondary | ICD-10-CM | POA: Diagnosis not present

## 2018-11-21 DIAGNOSIS — R6883 Chills (without fever): Secondary | ICD-10-CM | POA: Diagnosis not present

## 2018-11-21 DIAGNOSIS — M545 Low back pain: Secondary | ICD-10-CM | POA: Diagnosis not present

## 2018-11-21 LAB — URINALYSIS, ROUTINE W REFLEX MICROSCOPIC
Bilirubin Urine: NEGATIVE
Glucose, UA: NEGATIVE mg/dL
Hgb urine dipstick: NEGATIVE
Ketones, ur: NEGATIVE mg/dL
Leukocytes,Ua: NEGATIVE
Nitrite: NEGATIVE
Protein, ur: NEGATIVE mg/dL
Specific Gravity, Urine: 1.028 (ref 1.005–1.030)
pH: 5 (ref 5.0–8.0)

## 2018-11-21 LAB — CBC WITH DIFFERENTIAL/PLATELET
Abs Immature Granulocytes: 0.07 10*3/uL (ref 0.00–0.07)
Basophils Absolute: 0.1 10*3/uL (ref 0.0–0.1)
Basophils Relative: 0 %
Eosinophils Absolute: 0 10*3/uL (ref 0.0–0.5)
Eosinophils Relative: 0 %
HCT: 43.1 % (ref 36.0–46.0)
Hemoglobin: 14.3 g/dL (ref 12.0–15.0)
Immature Granulocytes: 0 %
Lymphocytes Relative: 4 %
Lymphs Abs: 0.7 10*3/uL (ref 0.7–4.0)
MCH: 30.9 pg (ref 26.0–34.0)
MCHC: 33.2 g/dL (ref 30.0–36.0)
MCV: 93.1 fL (ref 80.0–100.0)
Monocytes Absolute: 1.3 10*3/uL — ABNORMAL HIGH (ref 0.1–1.0)
Monocytes Relative: 8 %
Neutro Abs: 15.2 10*3/uL — ABNORMAL HIGH (ref 1.7–7.7)
Neutrophils Relative %: 88 %
Platelets: 252 10*3/uL (ref 150–400)
RBC: 4.63 MIL/uL (ref 3.87–5.11)
RDW: 12.4 % (ref 11.5–15.5)
WBC: 17.4 10*3/uL — ABNORMAL HIGH (ref 4.0–10.5)
nRBC: 0 % (ref 0.0–0.2)

## 2018-11-21 LAB — COMPREHENSIVE METABOLIC PANEL
ALT: 35 U/L (ref 0–44)
AST: 24 U/L (ref 15–41)
Albumin: 3.7 g/dL (ref 3.5–5.0)
Alkaline Phosphatase: 89 U/L (ref 38–126)
Anion gap: 13 (ref 5–15)
BUN: 27 mg/dL — ABNORMAL HIGH (ref 6–20)
CO2: 23 mmol/L (ref 22–32)
Calcium: 8.7 mg/dL — ABNORMAL LOW (ref 8.9–10.3)
Chloride: 99 mmol/L (ref 98–111)
Creatinine, Ser: 1.13 mg/dL — ABNORMAL HIGH (ref 0.44–1.00)
GFR calc Af Amer: >60 mL/min (ref >60)
GFR calc non Af Amer: 55 mL/min — ABNORMAL LOW (ref >60)
Glucose, Bld: 127 mg/dL — ABNORMAL HIGH (ref 70–99)
Potassium: 3.5 mmol/L (ref 3.5–5.1)
Sodium: 135 mmol/L (ref 135–145)
Total Bilirubin: 0.9 mg/dL (ref 0.3–1.2)
Total Protein: 7.2 g/dL (ref 6.5–8.1)

## 2018-11-21 MED ORDER — SODIUM CHLORIDE 0.9 % IV SOLN
INTRAVENOUS | Status: DC
  Administered 2018-11-21: 09:00:00 via INTRAVENOUS

## 2018-11-21 MED ORDER — ONDANSETRON HCL 4 MG/2ML IJ SOLN
4.0000 mg | Freq: Once | INTRAMUSCULAR | Status: AC
  Administered 2018-11-21: 4 mg via INTRAVENOUS
  Filled 2018-11-21: qty 2

## 2018-11-21 MED ORDER — SODIUM CHLORIDE 0.9 % IV BOLUS
500.0000 mL | Freq: Once | INTRAVENOUS | Status: AC
  Administered 2018-11-21: 500 mL via INTRAVENOUS

## 2018-11-21 MED ORDER — HYDROMORPHONE HCL 1 MG/ML IJ SOLN
0.5000 mg | Freq: Once | INTRAMUSCULAR | Status: AC
  Administered 2018-11-21: 0.5 mg via INTRAVENOUS
  Filled 2018-11-21: qty 1

## 2018-11-21 MED ORDER — HYDROCODONE-ACETAMINOPHEN 5-325 MG PO TABS: 1.0000 | ORAL_TABLET | Freq: Four times a day (QID) | ORAL | 0 refills | Status: DC | PRN

## 2018-11-21 NOTE — ED Notes (Signed)
Pt calling out in pain. Have messaged Dr. Rogene Houston for pain medication.

## 2018-11-21 NOTE — ED Notes (Signed)
Patient transported to CT 

## 2018-11-21 NOTE — ED Provider Notes (Signed)
[facility] EMERGENCY DEPARTMENT Provider Note   CSN: [id number] Arrival date & time: 11/21/18  0744     History   Chief Complaint Chief Complaint  Patient presents with  . Back Pain    HPI Caroline Wong is a 58 y.o. female.     Patient brought in by EMS.  Patient with acute onset overnight of low back pain myalgias 2 loose bowel movements chills no fever no cough no shortness of breath no chest pain.  Bilateral low back pain.  Patient's husband with similar symptoms except for he had nausea his COVID test was negative on Monday.  Patient denies any blood in the bowel movements.  She is also concerned that she may have a kidney stone.  The pain is fairly intense.  Patient does have a history of back pain in the past.     Past Medical History:  Diagnosis Date  . Abdominal pain 11/25/2013   Has some nausea associated with pain that has been on and off x 2 weeks will get [place]  . Anxiety    Takes Lorazepam  . Chronic bronchitis ([place])   . Gallstones   . GERD (gastroesophageal reflux disease)   . Hormone replacement therapy (HRT)   . Hypertension   . Night sweats 04/14/2014  . Obesity 05/07/2014  . Urge incontinence 08/19/2015  . Yeast infection 05/07/2014    Patient Active Problem List   Diagnosis Date Noted  . Encounter for well woman exam with routine gynecological exam 11/14/2018  . Ganglion cyst of volar aspect of right wrist   . Allergic rhinitis 06/12/2018  . Panic attacks 02/06/2018  . Elevated glucose 02/06/2018  . Petechial rash 01/05/2018  . Epistaxis 01/05/2018  . Screening for colorectal cancer 10/24/2017  . Encounter for gynecological examination with Papanicolaou smear of cervix 10/24/2017  . Cough 06/12/2017  . Upper respiratory tract infection 06/12/2017  . Anxiety 11/14/2016  . UTI (urinary tract infection) 11/11/2015  . Diarrhea 11/11/2015  . GERD (gastroesophageal reflux disease) 10/23/2015  . BP check 09/24/2015  . Urge incontinence 08/19/2015   . Stye external 07/15/2015  . S/P cervical spinal fusion 06/05/2015  . Acute sinusitis 05/20/2015  . Fatigue 05/13/2015  . Edema 04/01/2015  . Chest pain 04/01/2015  . Nonspecific abnormal electrocardiogram (ECG) (EKG) 04/01/2015  . 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 Neck pain, bilateral posterior 12/31/2014  . Lower back pain 12/31/2014  . Yeast infection 05/07/2014  . Obesity 05/07/2014  . Night sweats 04/14/2014  . Abdominal pain 11/25/2013  . Hormone replacement therapy (HRT) 08/21/2013  . Well adult exam 05/16/2013  . Hypertension     Past Surgical History:  Procedure Laterality Date  . ANTERIOR CERVICAL DECOMP/DISCECTOMY FUSION N/A 06/05/2015   Procedure: C5-6, C6-7 Anterior Cervical Discectomy and Fusion, Allograft, Plate;  Surgeon: [name], MD;  Location: [place];  Service: Orthopedics;  Laterality: N/A;  . ANTERIOR FUSION CERVICAL SPINE  06/05/2015   C 5 6 7    . CHOLECYSTECTOMY    . COLONOSCOPY    . FOOT SURGERY Left   . GANGLION CYST EXCISION Right 07/09/2018   Procedure: EXCISION, RIGHT VOLAR WRIST GANGLION;  Surgeon: [name], MD;  Location: [place];  Service: Orthopedics;  Laterality: Right;  . MOLE REMOVAL    . WISDOM TOOTH EXTRACTION       OB History    Gravida  2   Para  2   Term  2   Preterm  AB      Living  2     SAB      TAB      Ectopic      Multiple      Live Births  2            Home Medications    Prior to Admission medications   Medication Sig Start Date End Date Taking? Authorizing Provider  Ascorbic Acid (VITAMIN C) 100 MG tablet Take 100 mg by mouth daily.   Yes [provider]  Cholecalciferol (VITAMIN D3) 5000 units TABS Take by mouth. Takes 2 daily   Yes [provider]  cyclobenzaprine (FLEXERIL) 10 MG tablet Take 1 tablet (10 mg total) by mouth 3 (three) times daily as needed for muscle spasms. 12/25/15  Yes Gatha Mayer, MD  diphenhydrAMINE (BENADRYL) 25 MG  tablet Take 2 tablets (50 mg total) by mouth every 4 (four) hours as needed (bee sting). 02/24/15  Yes Plotnikov, [name], MD  LORazepam (ATIVAN) 0.5 MG tablet TAKE 1 TABLET BY MOUTH AT BEDTIME AS NEEDED 10/24/18  Yes Plotnikov, [name], MD  metoprolol succinate (TOPROL-XL) 50 MG 24 hr tablet Take 1 tablet (50 mg total) by mouth daily. Take with or immediately following a meal. 01/05/18  Yes Plotnikov, [name], MD  olmesartan (BENICAR) 40 MG tablet Take 1 tablet (40 mg total) by mouth daily. 01/05/18  Yes Plotnikov, [name], MD  triamterene-hydrochlorothiazide (MAXZIDE-25) 37.5-25 MG tablet Take 1 tablet by mouth daily. 02/06/18 02/06/19 Yes Plotnikov, [name], MD  HYDROcodone-acetaminophen (NORCO) 5-325 MG tablet Take 1 tablet by mouth every 8 (eight) hours as needed for moderate pain. Patient not taking: Reported on 11/21/2018 07/09/18   Lanae Crumbly, PA-C  HYDROcodone-acetaminophen (NORCO/VICODIN) 5-325 MG tablet Take 1 tablet by mouth every 6 (six) hours as needed. 11/21/18   Fredia Sorrow, MD  metFORMIN (GLUCOPHAGE-XR) 500 MG 24 hr tablet 2 po bid Patient not taking: Reported on 11/21/2018 02/06/18   Plotnikov, [name], MD  phentermine (ADIPEX-P) 37.5 MG tablet TAKE 1 TABLET BY MOUTH EVERY MORNING. DO NOT TAKE IF BLOOD PRESSURE IS ELEVATED. Patient not taking: Reported on 11/21/2018 10/24/18   Plotnikov, [name], MD  Specialty Vitamins Products (MENOPAUSE SUPPORT) TABS Take by mouth.    [provider]  valACYclovir (VALTREX) 1000 MG tablet take 1 tablet by mouth once daily Patient not taking: Reported on 11/14/2018 09/21/15   Estill Dooms, NP    Family History Family History  Problem Relation Age of Onset  . Breast cancer Mother        breast  . Diabetes Mother   . Fibromyalgia Mother   . Heart disease Mother        CHF  . Hypertension Mother   . Kidney disease Mother   . Heart disease Father   . Emphysema Father   . Diabetes Brother   . Hypertension Brother         x 2  . Heart disease Maternal Grandmother   . Diabetes Maternal Grandmother   . Asthma Maternal Grandmother   . Arthritis Maternal Grandmother   . Heart disease Maternal Grandfather   . Arthritis Maternal Grandfather   . Cancer Maternal Grandfather        bladder  . Breast cancer Paternal Grandmother   . Heart disease Paternal Grandmother   . Arthritis Paternal Grandmother   . Stroke Paternal Grandmother   . Diabetes Paternal Grandfather   . Heart disease Paternal  Grandfather   . Colon cancer Paternal Grandfather        mets  . Arthritis Paternal Grandfather   . Stomach cancer Paternal Grandfather        mets to stomach  . Hypertension Brother   . Esophageal cancer Neg Hx   . Pancreatic cancer Neg Hx   . Rectal cancer Neg Hx     Social History Social History   Tobacco Use  . Smoking status: Never Smoker  . Smokeless tobacco: Never Used  Substance Use Topics  . Alcohol use: Yes    Alcohol/week: 0.0 standard drinks    Comment: red wine 3 times weekly  . Drug use: No     Allergies   Bee venom; Biaxin [clarithromycin]; Amlodipine; Betadine [povidone iodine]; Chloraprep one step [chlorhexidine gluconate]; Duraprep [antiseptic products, misc.]; and Keflex [cephalexin]   Review of Systems Review of Systems  Constitutional: Positive for chills and fatigue. Negative for fever.  HENT: Negative for congestion, rhinorrhea and sore throat.   Eyes: Negative for visual disturbance.  Respiratory: Negative for cough and shortness of breath.   Cardiovascular: Negative for chest pain and leg swelling.  Gastrointestinal: Positive for diarrhea. Negative for abdominal pain, nausea and vomiting.  Genitourinary: Negative for dysuria and hematuria.  Musculoskeletal: Positive for back pain and myalgias. Negative for neck pain.  Skin: Negative for rash.  Neurological: Negative for dizziness, light-headedness and headaches.  Hematological: Does not bruise/bleed easily.   Psychiatric/Behavioral: Negative for confusion.     Physical Exam Updated Vital Signs BP 113/75   Pulse 82   Temp 99.9 F (37.7 C) (Oral)   Resp 16   Ht 1.753 m (5\' 9" )   Wt 104.3 kg   LMP 09/24/2017   SpO2 100%   BMI 33.97 kg/m   Physical Exam Vitals signs and nursing note reviewed.  Constitutional:      General: She is not in acute distress.    Appearance: Normal appearance. She is well-developed. She is ill-appearing.  HENT:     Head: Normocephalic and atraumatic.     Mouth/Throat:     Mouth: Mucous membranes are moist.  Eyes:     Extraocular Movements: Extraocular movements intact.     Conjunctiva/sclera: Conjunctivae normal.     Pupils: Pupils are equal, round, and reactive to light.  Neck:     Musculoskeletal: Normal range of motion and neck supple. No neck rigidity.  Cardiovascular:     Rate and Rhythm: Normal rate and regular rhythm.     Heart sounds: No murmur.  Pulmonary:     Effort: Pulmonary effort is normal. No respiratory distress.     Breath sounds: Normal breath sounds.  Abdominal:     Palpations: Abdomen is soft.     Tenderness: There is no abdominal tenderness.  Musculoskeletal: Normal range of motion.        General: No swelling.  Skin:    General: Skin is warm and dry.     Capillary Refill: Capillary refill takes less than 2 seconds.  Neurological:     General: No focal deficit present.     Mental Status: She is alert and oriented to person, place, and time.      ED Treatments / Results  Labs (all labs ordered are listed, but only abnormal results are displayed) Labs Reviewed  CBC WITH DIFFERENTIAL/PLATELET - Abnormal; Notable for the following components:      Result Value   WBC 17.4 (*)    Neutro Abs 15.2 (*)  Monocytes Absolute 1.3 (*)    All other components within normal limits  COMPREHENSIVE METABOLIC PANEL - Abnormal; Notable for the following components:   Glucose, Bld 127 (*)    BUN 27 (*)    Creatinine, Ser 1.13 (*)     Calcium 8.7 (*)    GFR calc non Af Amer 55 (*)    All other components within normal limits  URINE CULTURE  NOVEL CORONAVIRUS, NAA (HOSPITAL ORDER, SEND-OUT TO REF LAB)  URINALYSIS, ROUTINE W REFLEX MICROSCOPIC    EKG    Radiology Ct Chest Wo Contrast  Result Date: 11/21/2018 CLINICAL DATA:  Low back pain with diarrhea and chills. EXAM: CT CHEST WITHOUT CONTRAST TECHNIQUE: Multidetector CT imaging of the chest was performed following the standard protocol without IV contrast. COMPARISON:  Abdominal CT earlier today as well as chest CT 10/28/2015 FINDINGS: Cardiovascular: Heart is normal size. Mild prominence of the ascending thoracic aorta measuring 3.4 cm in AP diameter unchanged. Remaining vascular structures are unremarkable. Mediastinum/Nodes: No significant mediastinal or hilar adenopathy. Remaining mediastinal structures are unremarkable. No axillary adenopathy. Lungs/Pleura: Lungs are well inflated without airspace consolidation or effusion. Airways are unremarkable. Upper Abdomen: No acute findings. Musculoskeletal: Mild degenerative change of the spine. Anterior fusion hardware over the cervical spine. IMPRESSION: No acute cardiopulmonary disease. Stable mild dilatation of the ascending thoracic aorta measuring 3.4 cm. Recommend annual imaging followup by CTA or MRA. This recommendation follows 2010 ACCF/AHA/AATS/ACR/ASA/SCA/SCAI/SIR/STS/SVM Guidelines for the Diagnosis and Management of Patients with Thoracic Aortic Disease. Circulation.2010; 121: A540-J811. Aortic aneurysm NOS (ICD10-I71.9). Electronically Signed   By: Marin Olp M.D.   On: 11/21/2018 12:55   Dg Chest Port 1 View  Result Date: 11/21/2018 CLINICAL DATA:  Low back pain.  Diarrhea and chills. EXAM: PORTABLE CHEST 1 VIEW COMPARISON:  September 24, 2015 FINDINGS: The heart size and mediastinal contours are within normal limits. Both lungs are clear. The visualized skeletal structures are unremarkable. IMPRESSION: No active  disease. Electronically Signed   By: Dorise Bullion III M.D   On: 11/21/2018 09:09   Ct Renal Stone Study  Result Date: 11/21/2018 CLINICAL DATA:  Lower back pain. Diarrhea and chills. Renal stone disease suspected. EXAM: CT ABDOMEN AND PELVIS WITHOUT CONTRAST TECHNIQUE: Multidetector CT imaging of the abdomen and pelvis was performed following the standard protocol without IV contrast. COMPARISON:  12/09/2015 FINDINGS: Lower chest: Mild patchy density at the lung bases could be atelectasis or mild patchy bronchopneumonia. No dense consolidation or lobar collapse. No pleural effusion. Hepatobiliary: Diffuse fatty change of the liver. No evidence of focal lesion. Previous cholecystectomy. Pancreas: Normal Spleen: Normal Adrenals/Urinary Tract: Adrenal glands are normal. Both kidneys are normal. No cyst, mass, stone or hydronephrosis. No stone in either ureter. No stone in the bladder. Stomach/Bowel: No small bowel abnormality seen. The appendix is normal. There appears to be wall thickening of the ascending colon suggesting colitis. Surrounding fat is slightly edematous. There are a few small diverticula in the region. No sign of perforation or abscess. Left: May show mild wall thickening, but that is not as suggestive. Few scattered sigmoid diverticula. Vascular/Lymphatic: Aortic atherosclerosis. No aneurysm. IVC is normal. No retroperitoneal adenopathy. Reproductive: No pelvic mass. Other: No free fluid or air. Musculoskeletal: Mild lumbar degenerative changes. Old mild superior endplate depression at B14. IMPRESSION: Colitis, particularly of the right colon. This could be infectious or related to inflammatory bowel disease. No evidence of perforation or abscess. Diffuse fatty liver. Mild patchy density at the lung bases could be  atelectasis or mild bronchopneumonia No evidence of urinary tract pathology. Electronically Signed   By: Nelson Chimes M.D.   On: 11/21/2018 11:35    Procedures Procedures  (including critical care time)  Medications Ordered in ED Medications  0.9 %  sodium chloride infusion ( Intravenous Stopped 11/21/18 1445)  sodium chloride 0.9 % bolus 500 mL ( Intravenous Stopped 11/21/18 0902)  ondansetron (ZOFRAN) injection 4 mg (4 mg Intravenous Given 11/21/18 1025)  HYDROmorphone (DILAUDID) injection 0.5 mg (0.5 mg Intravenous Given 11/21/18 1024)  HYDROmorphone (DILAUDID) injection 0.5 mg (0.5 mg Intravenous Given 11/21/18 1330)     Initial Impression / Assessment and Plan / ED Course  I have reviewed the triage vital signs and the nursing notes.  Pertinent labs & imaging results that were available during my care of the patient were reviewed by me and considered in my medical decision making (see chart for details).        Extensive work-up here in the emergency department without any acute findings other than the finding on CT abdomen of some colitis.  But patient not having any significant bouts of diarrhea but had 2 loose bowel movements and had some chills.  No fever no upper respiratory symptoms some myalgias and and bilateral low back pain.  Was some concern may be that there was renal colic CT abdomen negative for stones.  Raise some concern for possible pneumonia chest x-ray showed no evidence of pneumonia so CT chest was done no evidence of pneumonia.  Patient's husband was tested on Monday for COVID he had similar symptoms except for he had nausea.  And his COVID test was negative.  We will do the outpatient COVID test on this patient.  She knows results to come back in 2 to 4 days.  Patient improved here with some fluids and pain control.  She be discharged home with pain medicine.  Do not feel any she needs to be started on antibiotics.  Final Clinical Impressions(s) / ED Diagnoses   Final diagnoses:  Myalgia  Suspected Covid-19 Virus Infection  Colitis    ED Discharge Orders         Ordered    HYDROcodone-acetaminophen (NORCO/VICODIN) 5-325 MG tablet   Every 6 hours PRN     11/21/18 1437           Fredia Sorrow, MD 11/21/18 1636

## 2018-11-21 NOTE — Discharge Instructions (Addendum)
COVID test is been ordered and you should have the results in 2 to 3 days.  Rest of work-up without any significant findings other than some evidence of some colitis.  Take the pain medication as directed.  Make an appointment to follow-up with your doctor.  Return for any new or worse symptoms.  We did CT chest CT abdomen ruled out kidney stones or any other abnormalities.  CT chest showed no evidence of pneumonia.

## 2018-11-21 NOTE — ED Triage Notes (Signed)
Pt reports pain in lower back, diarrhea x 2, and chills.  Denies fever,  Denies cough or sob>  Reports husband has had similar symptoms.  Reports he tested negative for covid Monday.

## 2018-11-22 LAB — URINE CULTURE: Culture: <10000 — AB

## 2018-11-22 LAB — NOVEL CORONAVIRUS, NAA (HOSP ORDER, SEND-OUT TO REF LAB; TAT 18-24 HRS): SARS-CoV-2, NAA: NOT DETECTED

## 2018-11-23 ENCOUNTER — Encounter (HOSPITAL_COMMUNITY): Payer: Self-pay | Admitting: *Deleted

## 2018-11-23 ENCOUNTER — Emergency Department (HOSPITAL_COMMUNITY)
Admission: EM | Admit: 2018-11-23 | Discharge: 2018-11-23 | Disposition: A | Discharge status: Home / Self Care | Payer: Federal, State, Local not specified - PPO | Attending: Emergency Medicine | Admitting: Emergency Medicine

## 2018-11-23 ENCOUNTER — Other Ambulatory Visit: Payer: Self-pay

## 2018-11-23 DIAGNOSIS — R197 Diarrhea, unspecified: Secondary | ICD-10-CM | POA: Diagnosis not present

## 2018-11-23 DIAGNOSIS — I10 Essential (primary) hypertension: Secondary | ICD-10-CM | POA: Diagnosis not present

## 2018-11-23 DIAGNOSIS — Z79899 Other long term (current) drug therapy: Secondary | ICD-10-CM | POA: Diagnosis not present

## 2018-11-23 LAB — C DIFFICILE QUICK SCREEN W PCR REFLEX
C Diff antigen: NEGATIVE
C Diff interpretation: NOT DETECTED
C Diff toxin: NEGATIVE

## 2018-11-23 LAB — CBC WITH DIFFERENTIAL/PLATELET
Abs Immature Granulocytes: 0.05 10*3/uL (ref 0.00–0.07)
Basophils Absolute: 0.1 10*3/uL (ref 0.0–0.1)
Basophils Relative: 1 %
Eosinophils Absolute: 0 10*3/uL (ref 0.0–0.5)
Eosinophils Relative: 1 %
HCT: 45.1 % (ref 36.0–46.0)
Hemoglobin: 14.7 g/dL (ref 12.0–15.0)
Immature Granulocytes: 1 %
Lymphocytes Relative: 17 %
Lymphs Abs: 1.3 10*3/uL (ref 0.7–4.0)
MCH: 30.5 pg (ref 26.0–34.0)
MCHC: 32.6 g/dL (ref 30.0–36.0)
MCV: 93.6 fL (ref 80.0–100.0)
Monocytes Absolute: 1.7 10*3/uL — ABNORMAL HIGH (ref 0.1–1.0)
Monocytes Relative: 22 %
Neutro Abs: 4.4 10*3/uL (ref 1.7–7.7)
Neutrophils Relative %: 58 %
Platelets: 254 10*3/uL (ref 150–400)
RBC: 4.82 MIL/uL (ref 3.87–5.11)
RDW: 12.1 % (ref 11.5–15.5)
WBC Morphology: INCREASED
WBC: 7.5 10*3/uL (ref 4.0–10.5)
nRBC: 0 % (ref 0.0–0.2)

## 2018-11-23 LAB — COMPREHENSIVE METABOLIC PANEL
ALT: 41 U/L (ref 0–44)
AST: 28 U/L (ref 15–41)
Albumin: 3.6 g/dL (ref 3.5–5.0)
Alkaline Phosphatase: 93 U/L (ref 38–126)
Anion gap: 16 — ABNORMAL HIGH (ref 5–15)
BUN: 15 mg/dL (ref 6–20)
CO2: 24 mmol/L (ref 22–32)
Calcium: 8.9 mg/dL (ref 8.9–10.3)
Chloride: 96 mmol/L — ABNORMAL LOW (ref 98–111)
Creatinine, Ser: 1.17 mg/dL — ABNORMAL HIGH (ref 0.44–1.00)
GFR calc Af Amer: >60 mL/min (ref >60)
GFR calc non Af Amer: 52 mL/min — ABNORMAL LOW (ref >60)
Glucose, Bld: 112 mg/dL — ABNORMAL HIGH (ref 70–99)
Potassium: 3.6 mmol/L (ref 3.5–5.1)
Sodium: 136 mmol/L (ref 135–145)
Total Bilirubin: 0.9 mg/dL (ref 0.3–1.2)
Total Protein: 7.4 g/dL (ref 6.5–8.1)

## 2018-11-23 LAB — POC OCCULT BLOOD, ED: Fecal Occult Bld: NEGATIVE

## 2018-11-23 MED ORDER — METRONIDAZOLE 500 MG PO TABS
500.0000 mg | ORAL_TABLET | Freq: Once | ORAL | Status: AC
  Administered 2018-11-23: 500 mg via ORAL
  Filled 2018-11-23: qty 1

## 2018-11-23 MED ORDER — CIPROFLOXACIN HCL 500 MG PO TABS: 500.0000 mg | ORAL_TABLET | Freq: Two times a day (BID) | ORAL | 0 refills | Status: AC

## 2018-11-23 MED ORDER — AZITHROMYCIN 250 MG PO TABS
500.0000 mg | ORAL_TABLET | Freq: Once | ORAL | Status: AC
  Administered 2018-11-23: 500 mg via ORAL
  Filled 2018-11-23: qty 2

## 2018-11-23 MED ORDER — MORPHINE SULFATE (PF) 4 MG/ML IV SOLN
4.0000 mg | Freq: Once | INTRAVENOUS | Status: AC
  Administered 2018-11-23: 4 mg via INTRAVENOUS
  Filled 2018-11-23: qty 1

## 2018-11-23 MED ORDER — METRONIDAZOLE 500 MG PO TABS: 500.0000 mg | ORAL_TABLET | Freq: Two times a day (BID) | ORAL | 0 refills | Status: DC

## 2018-11-23 MED ORDER — ONDANSETRON HCL 4 MG/2ML IJ SOLN
4.0000 mg | Freq: Once | INTRAMUSCULAR | Status: AC
  Administered 2018-11-23: 4 mg via INTRAVENOUS
  Filled 2018-11-23: qty 2

## 2018-11-23 MED ORDER — DICYCLOMINE HCL 20 MG PO TABS: 20.0000 mg | ORAL_TABLET | Freq: Two times a day (BID) | ORAL | 0 refills | Status: DC

## 2018-11-23 MED ORDER — CIPROFLOXACIN HCL 250 MG PO TABS
500.0000 mg | ORAL_TABLET | Freq: Once | ORAL | Status: AC
  Administered 2018-11-23: 500 mg via ORAL
  Filled 2018-11-23: qty 2

## 2018-11-23 MED ORDER — AZITHROMYCIN 500 MG PO TABS: 500.0000 mg | ORAL_TABLET | Freq: Every day | ORAL | 0 refills | Status: AC

## 2018-11-23 MED ORDER — SODIUM CHLORIDE 0.9 % IV BOLUS
500.0000 mL | Freq: Once | INTRAVENOUS | Status: AC
  Administered 2018-11-23: 500 mL via INTRAVENOUS

## 2018-11-23 NOTE — ED Triage Notes (Signed)
Diarrhea x 2 days, abdominal pain

## 2018-11-23 NOTE — Discharge Instructions (Addendum)
Take antibiotics as prescribed. Take the entire course, even if your symptoms improve.  Continue using pain medication as needed. You amy also take bentyl to help with gut spasms.  Use tylenol as needed for further pain control.  Use heating pads to help with pain.  Stick to a liquid diet and gradually progress to a soft and then full diet. There is information in the paperwork about food choices that will help and hurt diarrhea.  You amy try probiotics to help your gut health.  You may try imodium as needed if you diarrhea becomes severe.  Follow up with your primary care doctor as needed if your symptoms persist, but do not worsen.  Return to the ER if you have persistent vomiting, severe worsening pain, or any new, worsening, or concerning symptoms.

## 2018-11-23 NOTE — ED Provider Notes (Signed)
[facility] EMERGENCY DEPARTMENT Provider Note   CSN: [id number] Arrival date & time: 11/23/18  1148    History   Chief Complaint Chief Complaint  Patient presents with  . Diarrhea    HPI Caroline Wong is a 58 y.o. female presenting for evaluation of diarrhea, abd pain, and blood in her stool.   Patient states that she has been having symptoms for the past 3 days.  Initially, her pain was on her back.  Since then, pain has progressed more towards her lower abdomen bilaterally.  She reports associated diarrhea, having stools many times an hour, it is all watery and liquid.  She reports blood on the toilet paper after she wipes, but no obvious blood in her stool.  She was seen in the ED 2 days ago, found to have colitis.  Patient states her husband is having the same symptoms, he is currently admitted to the hospital.  The thought is his infection came from working with chickens.  She reports chills, but no obvious fever.  She denies chest pain, shortness breath, cough.  She reports nausea without vomiting.  Pain in the lower abdomen is intermittent and described as a severe cramp/contraction-like pain.  She was given Zofran and educated on at her last visit, which she has been taking with minimal improvement.  She states that she had some leg cramps yesterday, but this has since resolved.  She has tried to stay well-hydrated water.  She states that when she eats or drinks, this induces a bowel movement, but no vomiting. Both pt and husband have had negative covid tests.   Additional history obtained from chart review.  Patient with a history of anxiety, COPD, GERD, HRT, hypertension.  Labs and imaging from last ED visit reviewed, CT shows colitis.  Labs show an elevated white count at 17 and mild dehydration.     HPI  Past Medical History:  Diagnosis Date  . Abdominal pain 11/25/2013   Has some nausea associated with pain that has been on and off x 2 weeks will get [place]  . Anxiety    Takes Lorazepam  . Chronic bronchitis ([place])   . Gallstones   . GERD (gastroesophageal reflux disease)   . Hormone replacement therapy (HRT)   . Hypertension   . Night sweats 04/14/2014  . Obesity 05/07/2014  . Urge incontinence 08/19/2015  . Yeast infection 05/07/2014    Patient Active Problem List   Diagnosis Date Noted  . Encounter for well woman exam with routine gynecological exam 11/14/2018  . Ganglion cyst of volar aspect of right wrist   . Allergic rhinitis 06/12/2018  . Panic attacks 02/06/2018  . Elevated glucose 02/06/2018  . Petechial rash 01/05/2018  . Epistaxis 01/05/2018  . Screening for colorectal cancer 10/24/2017  . Encounter for gynecological examination with Papanicolaou smear of cervix 10/24/2017  . Cough 06/12/2017  . Upper respiratory tract infection 06/12/2017  . Anxiety 11/14/2016  . UTI (urinary tract infection) 11/11/2015  . Diarrhea 11/11/2015  . GERD (gastroesophageal reflux disease) 10/23/2015  . BP check 09/24/2015  . Urge incontinence 08/19/2015  . Stye external 07/15/2015  . S/P cervical spinal fusion 06/05/2015  . Acute sinusitis 05/20/2015  . Fatigue 05/13/2015  . Edema 04/01/2015  . Chest pain 04/01/2015  . Nonspecific abnormal electrocardiogram (ECG) (EKG) 04/01/2015  . 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 Neck pain, bilateral posterior 12/31/2014  . Lower back pain 12/31/2014  . Yeast infection 05/07/2014  . Obesity 05/07/2014  .  Night sweats 04/14/2014  . Abdominal pain 11/25/2013  . Hormone replacement therapy (HRT) 08/21/2013  . Well adult exam 05/16/2013  . Hypertension     Past Surgical History:  Procedure Laterality Date  . ANTERIOR CERVICAL DECOMP/DISCECTOMY FUSION N/A 06/05/2015   Procedure: C5-6, C6-7 Anterior Cervical Discectomy and Fusion, Allograft, Plate;  Surgeon: [name], MD;  Location: [place];  Service: Orthopedics;  Laterality: N/A;  . ANTERIOR FUSION CERVICAL SPINE  06/05/2015   C 5 6 7    .  CHOLECYSTECTOMY    . COLONOSCOPY    . FOOT SURGERY Left   . GANGLION CYST EXCISION Right 07/09/2018   Procedure: EXCISION, RIGHT VOLAR WRIST GANGLION;  Surgeon: [name], MD;  Location: [place];  Service: Orthopedics;  Laterality: Right;  . MOLE REMOVAL    . WISDOM TOOTH EXTRACTION       OB History    Gravida  2   Para  2   Term  2   Preterm      AB      Living  2     SAB      TAB      Ectopic      Multiple      Live Births  2            Home Medications    Prior to Admission medications   Medication Sig Start Date End Date Taking? Authorizing Provider  Ascorbic Acid (VITAMIN C) 100 MG tablet Take 100 mg by mouth daily.   Yes [provider]  Cholecalciferol (VITAMIN D3) 5000 units TABS Take 2 tablets by mouth daily. Takes 2 daily    Yes [provider]  cyclobenzaprine (FLEXERIL) 10 MG tablet Take 1 tablet (10 mg total) by mouth 3 (three) times daily as needed for muscle spasms. 12/25/15  Yes Gatha Mayer, MD  diphenhydrAMINE (BENADRYL) 25 MG tablet Take 2 tablets (50 mg total) by mouth every 4 (four) hours as needed (bee sting). 02/24/15  Yes Plotnikov, [name], MD  HYDROcodone-acetaminophen (NORCO/VICODIN) 5-325 MG tablet Take 1 tablet by mouth every 6 (six) hours as needed. 11/21/18  Yes Fredia Sorrow, MD  LORazepam (ATIVAN) 0.5 MG tablet TAKE 1 TABLET BY MOUTH AT BEDTIME AS NEEDED 10/24/18  Yes Plotnikov, [name], MD  metoprolol succinate (TOPROL-XL) 50 MG 24 hr tablet Take 1 tablet (50 mg total) by mouth daily. Take with or immediately following a meal. 01/05/18  Yes Plotnikov, [name], MD  olmesartan (BENICAR) 40 MG tablet Take 1 tablet (40 mg total) by mouth daily. 01/05/18  Yes Plotnikov, [name], MD  Specialty Vitamins Products (MENOPAUSE SUPPORT) TABS Take 1 tablet by mouth daily.    Yes [provider]  triamterene-hydrochlorothiazide (MAXZIDE-25) 37.5-25 MG tablet Take 1 tablet by mouth daily.  02/06/18 02/06/19 Yes Plotnikov, [name], MD  azithromycin (ZITHROMAX) 500 MG tablet Take 1 tablet (500 mg total) by mouth daily for 5 days. Take first 2 tablets together, then 1 every day until finished. 11/23/18 11/28/18  Melvyn Hommes, PA-C  ciprofloxacin (CIPRO) 500 MG tablet Take 1 tablet (500 mg total) by mouth 2 (two) times daily for 5 days. 11/23/18 11/28/18  Mate Alegria, PA-C  dicyclomine (BENTYL) 20 MG tablet Take 1 tablet (20 mg total) by mouth 2 (two) times daily. 11/23/18   Jacklyn Branan, PA-C  metroNIDAZOLE (FLAGYL) 500 MG tablet Take 1 tablet (500 mg total) by mouth 2 (two) times daily for 5 days. 11/23/18  11/28/18  Jeilyn Reznik, PA-C    Family History Family History  Problem Relation Age of Onset  . Breast cancer Mother        breast  . Diabetes Mother   . Fibromyalgia Mother   . Heart disease Mother        CHF  . Hypertension Mother   . Kidney disease Mother   . Heart disease Father   . Emphysema Father   . Diabetes Brother   . Hypertension Brother        x 2  . Heart disease Maternal Grandmother   . Diabetes Maternal Grandmother   . Asthma Maternal Grandmother   . Arthritis Maternal Grandmother   . Heart disease Maternal Grandfather   . Arthritis Maternal Grandfather   . Cancer Maternal Grandfather        bladder  . Breast cancer Paternal Grandmother   . Heart disease Paternal Grandmother   . Arthritis Paternal Grandmother   . Stroke Paternal Grandmother   . Diabetes Paternal Grandfather   . Heart disease Paternal Grandfather   . Colon cancer Paternal Grandfather        mets  . Arthritis Paternal Grandfather   . Stomach cancer Paternal Grandfather        mets to stomach  . Hypertension Brother   . Esophageal cancer Neg Hx   . Pancreatic cancer Neg Hx   . Rectal cancer Neg Hx     Social History Social History   Tobacco Use  . Smoking status: Never Smoker  . Smokeless tobacco: Never Used  Substance Use Topics  . Alcohol use: Yes     Alcohol/week: 0.0 standard drinks    Comment: red wine 3 times weekly  . Drug use: No     Allergies   Bee venom; Biaxin [clarithromycin]; Amlodipine; Betadine [povidone iodine]; Chloraprep one step [chlorhexidine gluconate]; Duraprep [antiseptic products, misc.]; and Keflex [cephalexin]   Review of Systems Review of Systems  Constitutional: Positive for chills.  Gastrointestinal: Positive for abdominal pain, blood in stool, diarrhea and nausea.  All other systems reviewed and are negative.    Physical Exam Updated Vital Signs BP 114/87   Pulse 100   Temp (!) 96.5 F (35.8 C) (Temporal)   Resp (!) 24   Ht 5\' 9"  (1.753 m)   Wt 103 kg   LMP 09/24/2017   BMI 33.53 kg/m   Physical Exam Vitals signs and nursing note reviewed. Exam conducted with a chaperone present.  Constitutional:      General: She is not in acute distress.    Appearance: She is well-developed.     Comments: Appears nontoxic  HENT:     Head: Normocephalic and atraumatic.  Eyes:     Conjunctiva/sclera: Conjunctivae normal.     Pupils: Pupils are equal, round, and reactive to light.  Neck:     Musculoskeletal: Normal range of motion and neck supple.  Cardiovascular:     Rate and Rhythm: Normal rate and regular rhythm.     Pulses: Normal pulses.  Pulmonary:     Effort: Pulmonary effort is normal. No respiratory distress.     Breath sounds: Normal breath sounds. No wheezing.  Abdominal:     General: There is no distension.     Palpations: Abdomen is soft. There is no mass.     Tenderness: There is abdominal tenderness. There is no guarding or rebound.     Comments: ttp of lower abd. No rigidity, guarding, distention. Negative rebound.  Genitourinary:    Comments: No gross blood on exam. hemoccult negative Musculoskeletal: Normal range of motion.  Skin:    General: Skin is warm and dry.     Capillary Refill: Capillary refill takes less than 2 seconds.  Neurological:     Mental Status: She is  alert and oriented to person, place, and time.      ED Treatments / Results  Labs (all labs ordered are listed, but only abnormal results are displayed) Labs Reviewed  CBC WITH DIFFERENTIAL/PLATELET - Abnormal; Notable for the following components:      Result Value   Monocytes Absolute 1.7 (*)    All other components within normal limits  COMPREHENSIVE METABOLIC PANEL - Abnormal; Notable for the following components:   Chloride 96 (*)    Glucose, Bld 112 (*)    Creatinine, Ser 1.17 (*)    GFR calc non Af Amer 52 (*)    Anion gap 16 (*)    All other components within normal limits  C DIFFICILE QUICK SCREEN W PCR REFLEX  GASTROINTESTINAL PANEL BY PCR, STOOL (REPLACES STOOL CULTURE)  POC OCCULT BLOOD, ED    EKG None  Radiology No results found.  Procedures Procedures (including critical care time)  Medications Ordered in ED Medications  ondansetron (ZOFRAN) injection 4 mg (4 mg Intravenous Given 11/23/18 1428)  morphine 4 MG/ML injection 4 mg (4 mg Intravenous Given 11/23/18 1428)  sodium chloride 0.9 % bolus 500 mL (0 mLs Intravenous Stopped 11/23/18 1520)  azithromycin (ZITHROMAX) tablet 500 mg (500 mg Oral Given 11/23/18 1520)  ciprofloxacin (CIPRO) tablet 500 mg (500 mg Oral Given 11/23/18 1520)  metroNIDAZOLE (FLAGYL) tablet 500 mg (500 mg Oral Given 11/23/18 1520)     Initial Impression / Assessment and Plan / ED Course  I have reviewed the triage vital signs and the nursing notes.  Pertinent labs & imaging results that were available during my care of the patient were reviewed by me and considered in my medical decision making (see chart for details).        Pt presenting for evaluation of nausea, diarrhea, and abd pain. Husband with similar sxs, admitted for presumed campylobacter infection from contact with chicken. physical exam shows pt who appears nontoxic. Will order labs to compare from 2 days ago. Will hold on repeat imaging of abd for now. Will tx for  pain and nausea control. c diff sent.   Rectal exam negative. Labs overall reassuring, wbc has normalized. Electrolytes stable. Pt tolerating PO without difficulty. I discussed with Dr. Oneida Alar from GI who is tx husband with cipro, flagyl, and azithro. At this time, I do not believe pt needs admission,. Will given first dose in the ED and have pt take Po meds at home. Strict return precautions given. Pt given strict diet information. At this time, pt appears safe for d/c. Pt states she understands and agrees to plan.   Final Clinical Impressions(s) / ED Diagnoses   Final diagnoses:  Diarrhea of presumed infectious origin    ED Discharge Orders         Ordered    ciprofloxacin (CIPRO) 500 MG tablet  2 times daily     11/23/18 1545    metroNIDAZOLE (FLAGYL) 500 MG tablet  2 times daily     11/23/18 1545    azithromycin (ZITHROMAX) 500 MG tablet  Daily     11/23/18 1545    dicyclomine (BENTYL) 20 MG tablet  2 times daily     11/23/18  [place], Akaisha Truman, PA-C 11/23/18 1612    Margette Fast, MD 11/23/18 2007

## 2018-11-24 LAB — GASTROINTESTINAL PANEL BY PCR, STOOL (REPLACES STOOL CULTURE)

## 2018-11-24 NOTE — ED Notes (Signed)
Date and time results received: 11/24/18 2:10 PM  (use smartphrase ".now" to insert current time)  Test: GI Panel Critical Value: positive campylobacter  Name of Provider Notified: [place]  Orders Received? Or Actions Taken?: Orders Received - See Orders for details

## 2018-11-24 NOTE — ED Notes (Signed)
Pt called and given results. Dr. Roderic Palau advised Cipro was appropriate and to follow up with primary care doctor.

## 2018-11-26 ENCOUNTER — Telehealth: Payer: Self-pay | Admitting: Gastroenterology

## 2018-11-26 NOTE — Telephone Encounter (Signed)
REVIEWED. OK TO ACCEPT PT.

## 2018-11-26 NOTE — Telephone Encounter (Signed)
Caroline Wong called today to say that her husband is a patient of [facility] and she is wanting to transfer her GI care from [facility] to [facility]. Please advise if you are willing to take her as a new patient. [phone]

## 2018-11-26 NOTE — Telephone Encounter (Signed)
Made the change in Epic

## 2018-11-26 NOTE — Telephone Encounter (Signed)
Patient is made aware of [facility] decision

## 2018-11-27 ENCOUNTER — Ambulatory Visit (INDEPENDENT_AMBULATORY_CARE_PROVIDER_SITE_OTHER): Payer: Federal, State, Local not specified - PPO | Admitting: Internal Medicine

## 2018-11-27 ENCOUNTER — Encounter: Payer: Self-pay | Admitting: Internal Medicine

## 2018-11-27 DIAGNOSIS — A09 Infectious gastroenteritis and colitis, unspecified: Secondary | ICD-10-CM | POA: Diagnosis not present

## 2018-11-27 DIAGNOSIS — F419 Anxiety disorder, unspecified: Secondary | ICD-10-CM | POA: Diagnosis not present

## 2018-11-27 DIAGNOSIS — I10 Essential (primary) hypertension: Secondary | ICD-10-CM | POA: Diagnosis not present

## 2018-11-27 MED ORDER — ONDANSETRON 4 MG PO TBDP: 4.0000 mg | ORAL_TABLET | Freq: Three times a day (TID) | ORAL | 1 refills | Status: DC | PRN

## 2018-11-27 NOTE — Assessment & Plan Note (Signed)
Olmesartan, Toprol XL 1/2 tab qd 12.5 mg, Maxzide

## 2018-11-27 NOTE — Progress Notes (Signed)
Virtual Visit via Video Note  I connected with Caroline Wong on 11/27/18 at  4:00 PM EDT by a video enabled telemedicine application and verified that I am speaking with the correct person using two identifiers.   I discussed the limitations of evaluation and management by telemedicine and the availability of in person appointments. The patient expressed understanding and agreed to proceed.  History of Present Illness: We need to follow-up on Caroline Wong presents for acute diarrhea, weakness, dehydration  There has been no runny nose, cough, chest pain, shortness of breath, abdominal pain,  constipation, arthralgias, skin rashes. C/o nausea   Observations/Objective: The patient appears to be in no acute distress  Assessment and Plan:  See my Assessment and Plan. Follow Up Instructions:    I discussed the assessment and treatment plan with the patient. The patient was provided an opportunity to ask questions and all were answered. The patient agreed with the plan and demonstrated an understanding of the instructions.   The patient was advised to call back or seek an in-person evaluation if the symptoms worsen or if the condition fails to improve as anticipated.  I provided face-to-face time during this encounter. We were at different locations.   Walker Kehr, MD

## 2018-11-27 NOTE — Assessment & Plan Note (Signed)
Stool cx - Campilobacter Zofran

## 2018-11-27 NOTE — Assessment & Plan Note (Signed)
Lorazepam prn 

## 2018-12-03 DIAGNOSIS — D229 Melanocytic nevi, unspecified: Secondary | ICD-10-CM | POA: Diagnosis not present

## 2018-12-04 NOTE — Telephone Encounter (Signed)
Forms have been printed & completed. Placed in providers box to review and sign.

## 2018-12-05 DIAGNOSIS — Z0279 Encounter for issue of other medical certificate: Secondary | ICD-10-CM

## 2019-01-18 ENCOUNTER — Other Ambulatory Visit: Payer: Self-pay | Admitting: Internal Medicine

## 2019-01-30 ENCOUNTER — Ambulatory Visit (INDEPENDENT_AMBULATORY_CARE_PROVIDER_SITE_OTHER): Payer: Federal, State, Local not specified - PPO | Admitting: Internal Medicine

## 2019-01-30 ENCOUNTER — Encounter: Payer: Self-pay | Admitting: Internal Medicine

## 2019-01-30 DIAGNOSIS — J4521 Mild intermittent asthma with (acute) exacerbation: Secondary | ICD-10-CM

## 2019-01-30 DIAGNOSIS — R059 Cough, unspecified: Secondary | ICD-10-CM

## 2019-01-30 DIAGNOSIS — A09 Infectious gastroenteritis and colitis, unspecified: Secondary | ICD-10-CM | POA: Diagnosis not present

## 2019-01-30 DIAGNOSIS — R05 Cough: Secondary | ICD-10-CM

## 2019-01-30 DIAGNOSIS — J45909 Unspecified asthma, uncomplicated: Secondary | ICD-10-CM | POA: Insufficient documentation

## 2019-01-30 MED ORDER — ALBUTEROL SULFATE HFA 108 (90 BASE) MCG/ACT IN AERS: 2.0000 | INHALATION_SPRAY | Freq: Four times a day (QID) | RESPIRATORY_TRACT | 3 refills | Status: DC | PRN

## 2019-01-30 MED ORDER — PROMETHAZINE-CODEINE 6.25-10 MG/5ML PO SYRP: 5.0000 mL | ORAL_SOLUTION | ORAL | 0 refills | Status: DC | PRN

## 2019-01-30 MED ORDER — OMEPRAZOLE 40 MG PO CPDR: 40.0000 mg | DELAYED_RELEASE_CAPSULE | Freq: Every day | ORAL | 3 refills | Status: DC

## 2019-01-30 NOTE — Assessment & Plan Note (Signed)
Promethazine with codeine cough syrup

## 2019-01-30 NOTE — Assessment & Plan Note (Signed)
postviral versus other Pro air HFA Promethazine with codeine syrup as needed Treat GERD

## 2019-01-30 NOTE — Progress Notes (Signed)
Virtual Visit via Video Note  I connected with Caroline Wong on 01/30/19 at 11:20 AM EDT by a video enabled telemedicine application and verified that I am speaking with the correct person using two identifiers.   I discussed the limitations of evaluation and management by telemedicine and the availability of in person appointments. The patient expressed understanding and agreed to proceed.  History of Present Illness: Is complaining of dry cough cardiac spells over past week.  They start in the afternoon usually when she was being using a conditioner.  Episodes last for a few hours.  There is no chest pain.  The spells are not productive of mucus.  There is no indigestion.  She ran out of her omeprazole prescription a while ago.  She had a viral type symptoms a week ago.  There is no nighttime cough.  No shortness of breath.  No known COVID-19 exposure  There has been no runny nose, chest pain, shortness of breath, abdominal pain, diarrhea, constipation, arthralgias, skin rashes.  F/u GERD   Observations/Objective: The patient appears to be in no acute distress, looks well.  Assessment and Plan:  See my Assessment and Plan. Follow Up Instructions:    I discussed the assessment and treatment plan with the patient. The patient was provided an opportunity to ask questions and all were answered. The patient agreed with the plan and demonstrated an understanding of the instructions.   The patient was advised to call back or seek an in-person evaluation if the symptoms worsen or if the condition fails to improve as anticipated.  I provided face-to-face time during this encounter. We were at different locations.   Walker Kehr, MD

## 2019-01-30 NOTE — Assessment & Plan Note (Signed)
Restart omeprazole daily

## 2019-02-05 ENCOUNTER — Other Ambulatory Visit: Payer: Self-pay | Admitting: Internal Medicine

## 2019-02-18 ENCOUNTER — Other Ambulatory Visit: Payer: Self-pay | Admitting: Internal Medicine

## 2019-03-12 ENCOUNTER — Ambulatory Visit (INDEPENDENT_AMBULATORY_CARE_PROVIDER_SITE_OTHER): Payer: Federal, State, Local not specified - PPO | Admitting: Internal Medicine

## 2019-03-12 ENCOUNTER — Encounter: Payer: Self-pay | Admitting: Internal Medicine

## 2019-03-12 ENCOUNTER — Other Ambulatory Visit: Payer: Self-pay

## 2019-03-12 VITALS — BP 132/92 | HR 65 | Temp 98.4°F | Ht 69.0 in

## 2019-03-12 DIAGNOSIS — Z0000 Encounter for general adult medical examination without abnormal findings: Secondary | ICD-10-CM | POA: Diagnosis not present

## 2019-03-12 DIAGNOSIS — I10 Essential (primary) hypertension: Secondary | ICD-10-CM | POA: Diagnosis not present

## 2019-03-12 NOTE — Assessment & Plan Note (Signed)
Olmesartan HCT Amlodipine 5 mg/d - hold 2019 due to LE rash. Resolved off Norvasc

## 2019-03-12 NOTE — Progress Notes (Signed)
Subjective:  Patient ID: Caroline Wong, female    DOB: November 14, 1960  Age: 58 y.o. MRN: [id number]  CC: No chief complaint on file.   HPI TICIA STEPTOE presents for a well exam F/u HTN  Outpatient Medications Prior to Visit  Medication Sig Dispense Refill  . albuterol (PROAIR HFA) 108 (90 Base) MCG/ACT inhaler Inhale 2 puffs into the lungs every 6 (six) hours as needed for wheezing or shortness of breath. 18 g 3  . Ascorbic Acid (VITAMIN C) 100 MG tablet Take 100 mg by mouth daily.    . Cholecalciferol (VITAMIN D3) 5000 units TABS Take 2 tablets by mouth daily. Takes 2 daily     . cyclobenzaprine (FLEXERIL) 10 MG tablet Take 1 tablet (10 mg total) by mouth 3 (three) times daily as needed for muscle spasms. 60 tablet 0  . dicyclomine (BENTYL) 20 MG tablet Take 1 tablet (20 mg total) by mouth 2 (two) times daily. 20 tablet 0  . diphenhydrAMINE (BENADRYL) 25 MG tablet Take 2 tablets (50 mg total) by mouth every 4 (four) hours as needed (bee sting). 30 tablet 3  . LORazepam (ATIVAN) 0.5 MG tablet TAKE 1 TABLET BY MOUTH AT BEDTIME AS NEEDED 30 tablet 2  . metoprolol succinate (TOPROL-XL) 50 MG 24 hr tablet TAKE 1 TABLET BY MOUTH DAILY. TAKE WITH OR IMMEDIATELY FOLLOWING A MEAL 90 tablet 3  . olmesartan (BENICAR) 40 MG tablet TAKE 1 TABLET BY MOUTH EVERY DAY 30 tablet 1  . omeprazole (PRILOSEC) 40 MG capsule Take 1 capsule (40 mg total) by mouth daily. 90 capsule 3  . ondansetron (ZOFRAN ODT) 4 MG disintegrating tablet Take 1 tablet (4 mg total) by mouth every 8 (eight) hours as needed for nausea or vomiting. 20 tablet 1  . promethazine-codeine (PHENERGAN WITH CODEINE) 6.25-10 MG/5ML syrup Take 5 mLs by mouth every 4 (four) hours as needed. 240 mL 0  . Specialty Vitamins Products (MENOPAUSE SUPPORT) TABS Take 1 tablet by mouth daily.     [name] triamterene-hydrochlorothiazide (MAXZIDE-25) 37.5-25 MG tablet TAKE 1 TABLET BY MOUTH DAILY 90 tablet 3   Facility-Administered Medications Prior to  Visit  Medication Dose Route Frequency Provider Last Rate Last Dose  . aminophylline injection 75 mg  75 mg Intravenous BID PRN Dorothy Spark, MD   75 mg at 04/13/15 1037    ROS: Review of Systems  Constitutional: Negative for activity change, appetite change, chills, fatigue and unexpected weight change.  HENT: Negative for congestion, mouth sores and sinus pressure.   Eyes: Negative for visual disturbance.  Respiratory: Negative for cough and chest tightness.   Gastrointestinal: Negative for abdominal pain and nausea.  Genitourinary: Negative for difficulty urinating, frequency and vaginal pain.  Musculoskeletal: Negative for back pain and gait problem.  Skin: Negative for pallor and rash.  Neurological: Negative for dizziness, tremors, weakness, numbness and headaches.  Psychiatric/Behavioral: Negative for confusion, sleep disturbance and suicidal ideas.    Objective:  BP (!) 132/92 (BP Location: Left Arm, Patient Position: Sitting, Cuff Size: Large)   Pulse 65   Temp 98.4 F (36.9 C) (Oral)   Ht 5\' 9"  (1.753 m)   LMP 09/24/2017   SpO2 98%   BMI 33.53 kg/m   BP Readings from Last 3 Encounters:  03/12/19 (!) 132/92  11/23/18 114/87  11/21/18 113/75    Wt Readings from Last 3 Encounters:  11/23/18 227 lb 1.2 oz (103 kg)  11/21/18 230 lb (104.3 kg)  11/14/18 234 lb (106.1 kg)  Physical Exam Constitutional:      General: She is not in acute distress.    Appearance: She is well-developed. She is obese.  HENT:     Head: Normocephalic.     Right Ear: External ear normal.     Left Ear: External ear normal.     Nose: Nose normal.  Eyes:     General:        Right eye: No discharge.        Left eye: No discharge.     Conjunctiva/sclera: Conjunctivae normal.     Pupils: Pupils are equal, round, and reactive to light.  Neck:     Musculoskeletal: Normal range of motion and neck supple.     Thyroid: No thyromegaly.     Vascular: No JVD.     Trachea: No tracheal  deviation.  Cardiovascular:     Rate and Rhythm: Normal rate and regular rhythm.     Heart sounds: Normal heart sounds.  Pulmonary:     Effort: No respiratory distress.     Breath sounds: No stridor. No wheezing.  Abdominal:     General: Bowel sounds are normal. There is no distension.     Palpations: Abdomen is soft. There is no mass.     Tenderness: There is no abdominal tenderness. There is no guarding or rebound.  Musculoskeletal:        General: No tenderness.  Lymphadenopathy:     Cervical: No cervical adenopathy.  Skin:    Findings: No erythema or rash.  Neurological:     Mental Status: She is oriented to person, place, and time.     Cranial Nerves: No cranial nerve deficit.     Motor: No abnormal muscle tone.     Coordination: Coordination normal.     Deep Tendon Reflexes: Reflexes normal.  Psychiatric:        Behavior: Behavior normal.        Thought Content: Thought content normal.        Judgment: Judgment normal.   obese  Lab Results  Component Value Date   WBC 7.5 11/23/2018   HGB 14.7 11/23/2018   HCT 45.1 11/23/2018   PLT 254 11/23/2018   GLUCOSE 112 (H) 11/23/2018   CHOL 163 11/01/2017   TRIG 86 11/01/2017   HDL 53 11/01/2017   LDLCALC 93 11/01/2017   ALT 41 11/23/2018   AST 28 11/23/2018   NA 136 11/23/2018   K 3.6 11/23/2018   CL 96 (L) 11/23/2018   CREATININE 1.17 (H) 11/23/2018   BUN 15 11/23/2018   CO2 24 11/23/2018   TSH 1.860 11/01/2017   INR 0.9 02/06/2018   HGBA1C 6.7 (H) 09/04/2018    No results found.  Assessment & Plan:   There are no diagnoses linked to this encounter.   No orders of the defined types were placed in this encounter.    Follow-up: No follow-ups on file.  Walker Kehr, MD

## 2019-03-12 NOTE — Assessment & Plan Note (Signed)
We discussed age appropriate health related issues, including available/recomended screening tests and vaccinations. We discussed a need for adhering to healthy diet and exercise. Labs were ordered to be later reviewed . All questions were answered.   

## 2019-03-12 NOTE — Patient Instructions (Signed)
These suggestions will probably help you to improve your metabolism if you are not overweight and to lose weight if you are overweight: 1.  Reduce your consumption of sugars and starches.  Eliminate high fructose corn syrup from your diet.  Reduce your consumption of processed foods.  For desserts try to have seasonal fruits, berries, nuts, cheeses or dark chocolate with more than 70% cacao. 2.  Do not snack 3.  You do not have to eat breakfast.  If you choose to have breakfast-eat plain greek yogurt, eggs, oatmeal (without sugar) 4.  Drink water, freshly brewed unsweetened tea (green, black or herbal) or coffee.  Do not drink sodas including diet sodas , juices, beverages sweetened with artificial sweeteners. 5.  Reduce your consumption of refined grains. 6.  Avoid protein drinks such as Optifast, Slim fast etc. Eat chicken, fish, meat, dairy and beans for your sources of protein 7.  Natural unprocessed fats like cold pressed virgin olive oil, butter, coconut oil are good for you.  Eat avocados 8.  Increase your consumption of fiber.  Fruits, berries, vegetables, whole grains, flaxseeds, Chia seeds, beans, popcorn, nuts, oatmeal are good sources of fiber 9.  Use vinegar in your diet, i.e. apple cider vinegar, red wine or balsamic vinegar 10.  You can try fasting.  For example you can skip breakfast and lunch every other day (24-hour fast) 11.  Stress reduction, good night sleep, relaxation, meditation, yoga and other physical activity is likely to help you to maintain low weight too. 12.  If you drink alcohol, limit your alcohol intake to no more than 2 drinks a day.   Mediterranean diet is good for you. ([employer] has a typical Mediterranean cuisine menu) The Mediterranean diet is a way of eating based on the traditional cuisine of countries bordering the [place] Sea. While there is no single definition of the Mediterranean diet, it is typically high in vegetables, fruits, whole grains,  beans, nut and seeds, and olive oil. The main components of Mediterranean diet include: . Daily consumption of vegetables, fruits, whole grains and healthy fats  . Weekly intake of fish, poultry, beans and eggs  . Moderate portions of dairy products  . Limited intake of red meat Other important elements of the Mediterranean diet are sharing meals with family and friends, enjoying a glass of red wine and being physically active. Health benefits of a Mediterranean diet: A traditional Mediterranean diet consisting of large quantities of fresh fruits and vegetables, nuts, fish and olive oil-coupled with physical activity-can reduce your risk of serious mental and physical health problems by: Preventing heart disease and strokes. Following a Mediterranean diet limits your intake of refined breads, processed foods, and red meat, and encourages drinking red wine instead of hard liquor-all factors that can help prevent heart disease and stroke. Keeping you agile. If you're an older adult, the nutrients gained with a Mediterranean diet may reduce your risk of developing muscle weakness and other signs of frailty by about 70 percent. Reducing the risk of Alzheimer's. Research suggests that the Mediterranean diet may improve cholesterol, blood sugar levels, and overall blood vessel health, which in turn may reduce your risk of Alzheimer's disease or dementia. Halving the risk of Parkinson's disease. The high levels of antioxidants in the Mediterranean diet can prevent cells from undergoing a damaging process called oxidative stress, thereby cutting the risk of Parkinson's disease in half. Increasing longevity. By reducing your risk of developing heart disease or cancer with the Mediterranean diet,   you're reducing your risk of death at any age by 20%. Protecting against type 2 diabetes. A Mediterranean diet is rich in fiber which digests slowly, prevents huge swings in blood sugar, and can help you maintain a  healthy weight.    Cabbage soup recipe that will not make you gain weight: Take 1 small head of cabbage, 1 average pack of celery, 4 green peppers, 4 onions, 2 cans diced tomatoes (they are not available without salt), salt and spices to taste.  Chop cabbage, celery, peppers and onions.  And tomatoes and 2-2.5 liters (2.5 quarts) of water so that it would just cover the vegetables.  Bring to boil.  Add spices and salt.  Turn heat to low/medium and simmer for 20-25 minutes.  Naturally, you can make a smaller batch and change some of the ingredients.   Cardiac CT calcium scoring test $150   Computed tomography, more commonly known as a CT or CAT scan, is a diagnostic medical imaging test. Like traditional x-rays, it produces multiple images or pictures of the inside of the body. The cross-sectional images generated during a CT scan can be reformatted in multiple planes. They can even generate three-dimensional images. These images can be viewed on a computer monitor, printed on film or by a 3D printer, or transferred to a CD or DVD. CT images of internal organs, bones, soft tissue and blood vessels provide greater detail than traditional x-rays, particularly of soft tissues and blood vessels. A cardiac CT scan for coronary calcium is a non-invasive way of obtaining information about the presence, location and extent of calcified plaque in the coronary arteries-the vessels that supply oxygen-containing blood to the heart muscle. Calcified plaque results when there is a build-up of fat and other substances under the inner layer of the artery. This material can calcify which signals the presence of atherosclerosis, a disease of the vessel wall, also called coronary artery disease (CAD). People with this disease have an increased risk for heart attacks. In addition, over time, progression of plaque build up (CAD) can narrow the arteries or even close off blood flow to the heart. The result may be chest pain,  sometimes called "angina," or a heart attack. Because calcium is a marker of CAD, the amount of calcium detected on a cardiac CT scan is a helpful prognostic tool. The findings on cardiac CT are expressed as a calcium score. Another name for this test is coronary artery calcium scoring.  What are some common uses of the procedure? The goal of cardiac CT scan for calcium scoring is to determine if CAD is present and to what extent, even if there are no symptoms. It is a screening study that may be recommended by a physician for patients with risk factors for CAD but no clinical symptoms. The major risk factors for CAD are: . high blood cholesterol levels  . family history of heart attacks  . diabetes  . high blood pressure  . cigarette smoking  . overweight or obese  . physical inactivity   A negative cardiac CT scan for calcium scoring shows no calcification within the coronary arteries. This suggests that CAD is absent or so minimal it cannot be seen by this technique. The chance of having a heart attack over the next two to five years is very low under these circumstances. A positive test means that CAD is present, regardless of whether or not the patient is experiencing any symptoms. The amount of calcification-expressed as the calcium score-may   help to predict the likelihood of a myocardial infarction (heart attack) in the coming years and helps your medical doctor or cardiologist decide whether the patient may need to take preventive medicine or undertake other measures such as diet and exercise to lower the risk for heart attack. The extent of CAD is graded according to your calcium score:  Calcium Score  Presence of CAD (coronary artery disease)  0 No evidence of CAD   1-10 Minimal evidence of CAD  11-100 Mild evidence of CAD  101-400 Moderate evidence of CAD  Over 400 Extensive evidence of CAD    

## 2019-03-20 ENCOUNTER — Other Ambulatory Visit (INDEPENDENT_AMBULATORY_CARE_PROVIDER_SITE_OTHER): Payer: Federal, State, Local not specified - PPO

## 2019-03-20 DIAGNOSIS — Z0000 Encounter for general adult medical examination without abnormal findings: Secondary | ICD-10-CM | POA: Diagnosis not present

## 2019-03-20 LAB — LIPID PANEL
Cholesterol: 165 mg/dL (ref 0–200)
HDL: 47.9 mg/dL (ref >39.00)
LDL Cholesterol: 88 mg/dL (ref 0–99)
NonHDL: 117.39
Total CHOL/HDL Ratio: 3
Triglycerides: 148 mg/dL (ref 0.0–149.0)
VLDL: 29.6 mg/dL (ref 0.0–40.0)

## 2019-03-20 LAB — URINALYSIS
Bilirubin Urine: NEGATIVE
Hgb urine dipstick: NEGATIVE
Ketones, ur: NEGATIVE
Leukocytes,Ua: NEGATIVE
Nitrite: NEGATIVE
Specific Gravity, Urine: 1.02 (ref 1.000–1.030)
Total Protein, Urine: NEGATIVE
Urine Glucose: NEGATIVE
Urobilinogen, UA: 0.2 (ref 0.0–1.0)
pH: 6.5 (ref 5.0–8.0)

## 2019-03-20 LAB — CBC WITH DIFFERENTIAL/PLATELET
Basophils Absolute: 0.1 10*3/uL (ref 0.0–0.1)
Basophils Relative: 0.9 % (ref 0.0–3.0)
Eosinophils Absolute: 0.1 10*3/uL (ref 0.0–0.7)
Eosinophils Relative: 1.5 % (ref 0.0–5.0)
HCT: 42.6 % (ref 36.0–46.0)
Hemoglobin: 14.4 g/dL (ref 12.0–15.0)
Lymphocytes Relative: 24.4 % (ref 12.0–46.0)
Lymphs Abs: 1.9 10*3/uL (ref 0.7–4.0)
MCHC: 33.7 g/dL (ref 30.0–36.0)
MCV: 92.8 fl (ref 78.0–100.0)
Monocytes Absolute: 0.6 10*3/uL (ref 0.1–1.0)
Monocytes Relative: 8.4 % (ref 3.0–12.0)
Neutro Abs: 4.9 10*3/uL (ref 1.4–7.7)
Neutrophils Relative %: 64.8 % (ref 43.0–77.0)
Platelets: 269 10*3/uL (ref 150.0–400.0)
RBC: 4.6 Mil/uL (ref 3.87–5.11)
RDW: 12.7 % (ref 11.5–15.5)
WBC: 7.6 10*3/uL (ref 4.0–10.5)

## 2019-03-20 LAB — BASIC METABOLIC PANEL
BUN: 20 mg/dL (ref 6–23)
CO2: 25 mEq/L (ref 19–32)
Calcium: 9.3 mg/dL (ref 8.4–10.5)
Chloride: 102 mEq/L (ref 96–112)
Creatinine, Ser: 0.94 mg/dL (ref 0.40–1.20)
GFR: 61.61 mL/min (ref >60.00)
Glucose, Bld: 119 mg/dL — ABNORMAL HIGH (ref 70–99)
Potassium: 3.8 mEq/L (ref 3.5–5.1)
Sodium: 137 mEq/L (ref 135–145)

## 2019-03-20 LAB — HEPATIC FUNCTION PANEL
ALT: 25 U/L (ref 0–35)
AST: 19 U/L (ref 0–37)
Albumin: 4 g/dL (ref 3.5–5.2)
Alkaline Phosphatase: 84 U/L (ref 39–117)
Bilirubin, Direct: 0 mg/dL (ref 0.0–0.3)
Total Bilirubin: 0.5 mg/dL (ref 0.2–1.2)
Total Protein: 7.2 g/dL (ref 6.0–8.3)

## 2019-03-20 LAB — TSH: TSH: 0.78 u[IU]/mL (ref 0.35–4.50)

## 2019-04-12 ENCOUNTER — Other Ambulatory Visit: Payer: Self-pay | Admitting: Adult Health

## 2019-04-12 DIAGNOSIS — N950 Postmenopausal bleeding: Secondary | ICD-10-CM

## 2019-04-15 ENCOUNTER — Ambulatory Visit (INDEPENDENT_AMBULATORY_CARE_PROVIDER_SITE_OTHER): Payer: Federal, State, Local not specified - PPO

## 2019-04-15 ENCOUNTER — Other Ambulatory Visit: Payer: Self-pay

## 2019-04-15 DIAGNOSIS — N950 Postmenopausal bleeding: Secondary | ICD-10-CM

## 2019-04-15 NOTE — Progress Notes (Signed)
PELVIC US TA/TV:homogenous retroverted uterus w/a small anterior intramural fibroid 1.4 x .9 x 1.1 cm,homogeneous thickened endometrium 7 mm,normal right ovary (limited view),left ovary not visualized,no free fluid,some pelvic discomfort during ultrasound  Chaperone: Angie

## 2019-04-17 ENCOUNTER — Telehealth: Payer: Self-pay | Admitting: Adult Health

## 2019-04-17 NOTE — Telephone Encounter (Signed)
Pt aware that uterus has small fibroid, right ovary is normal and left ovary not visualized and EEC is 7 mm will need endometrial biopsy.appointment made for 11/16 with Dr Elonda Husky at 11 am. Note given to be out of work 11/16 and 11/17.

## 2019-04-22 ENCOUNTER — Encounter: Payer: Self-pay | Admitting: Obstetrics & Gynecology

## 2019-04-22 ENCOUNTER — Ambulatory Visit (INDEPENDENT_AMBULATORY_CARE_PROVIDER_SITE_OTHER): Payer: Federal, State, Local not specified - PPO | Admitting: Obstetrics & Gynecology

## 2019-04-22 ENCOUNTER — Other Ambulatory Visit: Payer: Self-pay

## 2019-04-22 VITALS — BP 145/86 | HR 94 | Ht 67.5 in | Wt 242.0 lb

## 2019-04-22 DIAGNOSIS — R9389 Abnormal findings on diagnostic imaging of other specified body structures: Secondary | ICD-10-CM | POA: Diagnosis not present

## 2019-04-22 DIAGNOSIS — N950 Postmenopausal bleeding: Secondary | ICD-10-CM

## 2019-04-22 NOTE — Progress Notes (Signed)
Endometrial Biopsy Procedure Note  Pre-operative Diagnosis: PMB with thickened endometrial stripe  Post-operative Diagnosis: same  Indications: Thickened endometrium with PMB  Procedure Details   Urine pregnancy test was not done.  The risks (including infection, bleeding, pain, and uterine perforation) and benefits of the procedure were explained to the patient and Written informed consent was obtained.  Antibiotic prophylaxis against endocarditis was not indicated.   The patient was placed in the dorsal lithotomy position.  Bimanual exam showed the uterus to be in the neutral position.  A Graves' speculum inserted in the vagina, and the cervix prepped with povidone iodine.  Endocervical curettage with a Kevorkian curette was not performed.   A sharp tenaculum was applied to the anterior lip of the cervix for stabilization.  A sterile uterine sound was used to sound the uterus to a depth of 6.5cm.  A Pipelle endometrial aspirator was used to sample the endometrium.  Sample was sent for pathologic examination.  Condition: Stable  Complications: None  Plan:  The patient was advised to call for any fever or for prolonged or severe pain or bleeding. She was advised to use OTC ibuprofen as needed for mild to moderate pain. She was advised to avoid vaginal intercourse for 48 hours or until the bleeding has completely stopped.  Attending Physician Documentation: I performed the procedure    Patient ID: Caroline Wong, female   DOB: Dec 21, 1960, 58 y.o.   MRN: [id number]

## 2019-04-22 NOTE — Addendum Note (Signed)
Addended by: Christiana Pellant A on: 04/22/2019 12:28 PM   Modules accepted: Orders

## 2019-04-25 ENCOUNTER — Other Ambulatory Visit: Payer: Self-pay

## 2019-04-25 ENCOUNTER — Telehealth (INDEPENDENT_AMBULATORY_CARE_PROVIDER_SITE_OTHER): Payer: Federal, State, Local not specified - PPO | Admitting: Obstetrics & Gynecology

## 2019-04-25 ENCOUNTER — Encounter: Payer: Self-pay | Admitting: Obstetrics & Gynecology

## 2019-04-25 DIAGNOSIS — R9389 Abnormal findings on diagnostic imaging of other specified body structures: Secondary | ICD-10-CM

## 2019-04-25 DIAGNOSIS — N950 Postmenopausal bleeding: Secondary | ICD-10-CM

## 2019-04-25 MED ORDER — MEDROXYPROGESTERONE ACETATE 10 MG PO TABS: ORAL_TABLET | ORAL | 3 refills | Status: DC

## 2019-04-25 NOTE — Progress Notes (Signed)
TELEHEALTH VIRTUAL GYNECOLOGY VISIT ENCOUNTER NOTE  I connected with Caroline Wong on 04/25/19 at  3:30 PM EST by telephone at home and verified that I am speaking with the correct person using two identifiers.   I discussed the limitations, risks, security and privacy concerns of performing an evaluation and management service by telephone and the availability of in person appointments. I also discussed with the patient that there may be a patient responsible charge related to this service. The patient expressed understanding and agreed to proceed.   History:  Caroline Wong is a 58 y.o. [phone] female being evaluated today for management of PMB with proliferative pathology.  Will cycle with provera 10 days a month for 3 months and she will send me a message and let me know how it has gone.  Hopefully if it is relatively minimal can switch to every 3 months for ongoing endometrial suppression .       Past Medical History:  Diagnosis Date   Abdominal pain 11/25/2013   Has some nausea associated with pain that has been on and off x 2 weeks will get [place]   Anxiety    Takes Lorazepam   Chronic bronchitis (HCC)    Gallstones    GERD (gastroesophageal reflux disease)    Hormone replacement therapy (HRT)    Hypertension    Night sweats 04/14/2014   Obesity 05/07/2014   Urge incontinence 08/19/2015   Yeast infection 05/07/2014   Past Surgical History:  Procedure Laterality Date   ANTERIOR CERVICAL DECOMP/DISCECTOMY FUSION N/A 06/05/2015   Procedure: C5-6, C6-7 Anterior Cervical Discectomy and Fusion, Allograft, Plate;  Surgeon: [name], MD;  Location: [place];  Service: Orthopedics;  Laterality: N/A;   ANTERIOR FUSION CERVICAL SPINE  06/05/2015   C 5 6 7     CHOLECYSTECTOMY     COLONOSCOPY     FOOT SURGERY Left    GANGLION CYST EXCISION Right 07/09/2018   Procedure: EXCISION, RIGHT VOLAR WRIST GANGLION;  Surgeon: [name], MD;  Location: [facility];  Service: Orthopedics;  Laterality: Right;   MOLE REMOVAL     WISDOM TOOTH EXTRACTION     The following portions of the patient's history were reviewed and updated as appropriate: allergies, current medications, past family history, past medical history, past social history, past surgical history and problem list.   Health Maintenance:  Normal pap and negative HRHPV on .  Normal mammogram on .   Review of Systems:  Pertinent items noted in HPI and remainder of comprehensive ROS otherwise negative.  Physical Exam:  Physical exam deferred due to nature of the encounter  Labs and Imaging No results found for this or any previous visit (from the past 336 hour(s)). US Transvaginal Non-ob  Result Date: 04/17/2019 GYNECOLOGIC SONOGRAM Caroline Wong is a 58 y.o. [id number] she is here for a pelvic sonogram for PMB. Uterus                      7.2 x 4.9 x 5.8 cm, Total uterine volume 108 cc,homogenous retroverted uterus w/a small anterior intramural fibroid 1.4 x .9 x 1.1 cm Endometrium          7 mm, symmetrical, homogeneous thickened endometrium Right ovary             2.1 x 1.7 x 1.9 cm, WNL (limited view) Left ovary  Not visualized No free fluid Technician Comments: PELVIC US TA/TV:homogenous retroverted uterus w/a small anterior intramural fibroid 1.4 x .9 x 1.1 cm,homogeneous thickened endometrium 7 mm,normal right ovary (limited view),left ovary not visualized,no free fluid,some pelvic discomfort during ultrasound Chaperone: Angie Silver [place] 04/15/2019 12:32 PM Clinical Impression and recommendations: I have reviewed the sonogram results above, combined with the patient's current clinical course, below are my impressions and any appropriate recommendations for management based on the sonographic findings. Uterus is norma with small clinically insignificant myoma Endometrium is thickened for a post menopausal woman Right ovary normal, left not seen but no adnexal pathology  With clinical history of post menopausal bleeding with ES 15mm, patient requires endometrial sampling Florian Buff 04/17/2019 9:59 AM  US Pelvis Complete  Result Date: 04/17/2019 GYNECOLOGIC SONOGRAM Caroline Wong is a 58 y.o. [id number] she is here for a pelvic sonogram for PMB. Uterus                      7.2 x 4.9 x 5.8 cm, Total uterine volume 108 cc,homogenous retroverted uterus w/a small anterior intramural fibroid 1.4 x .9 x 1.1 cm Endometrium          7 mm, symmetrical, homogeneous thickened endometrium Right ovary             2.1 x 1.7 x 1.9 cm, WNL (limited view) Left ovary               Not visualized No free fluid Technician Comments: PELVIC US TA/TV:homogenous retroverted uterus w/a small anterior intramural fibroid 1.4 x .9 x 1.1 cm,homogeneous thickened endometrium 7 mm,normal right ovary (limited view),left ovary not visualized,no free fluid,some pelvic discomfort during ultrasound Chaperone: [employer] 04/15/2019 12:32 PM Clinical Impression and recommendations: I have reviewed the sonogram results above, combined with the patient's current clinical course, below are my impressions and any appropriate recommendations for management based on the sonographic findings. Uterus is norma with small clinically insignificant myoma Endometrium is thickened for a post menopausal woman Right ovary normal, left not seen but no adnexal pathology With clinical history of post menopausal bleeding with ES 36mm, patient requires endometrial sampling Florian Buff 04/17/2019 9:59 AM      Pathology: proliferative endometrium without hyperplasia or atypia  Meds ordered this encounter  Medications   medroxyPROGESTERone (PROVERA) 10 MG tablet    Sig: Take 1 tablet a day for 10 days of the month    Dispense:  30 tablet    Refill:  3    No orders of the defined types were placed in this encounter.   Assessment and Plan:       ICD-10-CM   1. Postmenopausal bleeding  N95.0   2. Thickened  endometrium  R93.89    .      I discussed the assessment and treatment plan with the patient. The patient was provided an opportunity to ask questions and all were answered. The patient agreed with the plan and demonstrated an understanding of the instructions.   The patient was advised to call back or seek an in-person evaluation/go to the ED if the symptoms worsen or if the condition fails to improve as anticipated.  I provided 15 minutes of non-face-to-face time during this encounter.   Florian Buff, [place] for [facility]

## 2019-05-02 ENCOUNTER — Other Ambulatory Visit: Payer: Self-pay | Admitting: Internal Medicine

## 2019-05-29 ENCOUNTER — Encounter: Payer: Federal, State, Local not specified - PPO | Admitting: Obstetrics and Gynecology

## 2019-06-11 ENCOUNTER — Encounter: Payer: Self-pay | Admitting: Obstetrics and Gynecology

## 2019-06-11 ENCOUNTER — Ambulatory Visit: Payer: Federal, State, Local not specified - PPO | Admitting: Obstetrics and Gynecology

## 2019-06-11 ENCOUNTER — Other Ambulatory Visit (HOSPITAL_COMMUNITY)
Admission: RE | Admit: 2019-06-11 | Discharge: 2019-06-11 | Disposition: A | Discharge status: Home / Self Care | Payer: Federal, State, Local not specified - PPO | Source: Ambulatory Visit | Attending: Obstetrics and Gynecology | Admitting: Obstetrics and Gynecology

## 2019-06-11 ENCOUNTER — Other Ambulatory Visit: Payer: Self-pay

## 2019-06-11 VITALS — BP 140/86 | HR 70 | Temp 96.9°F | Resp 16 | Ht 66.5 in | Wt 238.6 lb

## 2019-06-11 DIAGNOSIS — N950 Postmenopausal bleeding: Secondary | ICD-10-CM | POA: Diagnosis not present

## 2019-06-11 DIAGNOSIS — Z124 Encounter for screening for malignant neoplasm of cervix: Secondary | ICD-10-CM

## 2019-06-11 NOTE — Progress Notes (Signed)
GYNECOLOGY  VISIT   HPI: 59 y.o.   Married  Caucasian  female   G2P2002 with Patient's last menstrual period was 02/04/2018 (approximate).   here for second opinion for abnormal uterine bleeding.  Patient's last cycle was 04/2018. She had PUS which was normal per patient(PUS in Epic suggest thickened endometrium). She began having lower pelvic pain, and bleeding began again 04/2019 and was then advised she needed Progesterone which made her bleed excessively for 10 days. She was offered an IUD and patient declined.  Her half sister was recently diagnosed with endometrial cancer and patient very concerned.  Patient bled for 3 weeks in October, 2020 after going for 13 months with no menses. Was passing clots and unable to use tampons due to the heavy flow.  Patient had pain off and on during the last year and had ultrasounds leading up to this.   Last pelvic US 04/15/19 - Uterus with 1.4 cm fibroids anterior and intramural.  EMS 7 mm.  Right ovary normal and left ovary not visualized.  No free fluid. EMB 04/22/19 - proliferative endometrium.  No atypia, hyperplasia or malignancy.   She is doing cyclic Provera 10 mg x 10 day and she continues to get a menstruation following this.  She does not like the pain and declines continuing this.  She states she hurts now and is pointing to her lower abdomen.  She feel emotional and has sore breasts on the Provera.  She has started having hot flashes again when she is taking the Provera.   In about 2007 or 2008, she was dx with low progesterone.  She took progesterone supplementation compounded for a period of time.  She had a change in the variation of the progesterone through [facility].   Works for the [employer]  Married for almost 7 years.   GYNECOLOGIC HISTORY: Patient's last menstrual period was 02/04/2018 (approximate). Contraception: None Menopausal hormone therapy: none Last mammogram: 10-30-18 3D/Neg/density /B/BiRads1--[facility]  [facility] Last pap smear:  10-24-17 Neg:Neg HR HPV,08-19-15 Neg:Neg HR HPV        OB History    Gravida  2   Para  2   Term  2   Preterm      AB      Living  2     SAB      TAB      Ectopic      Multiple      Live Births  2              Patient Active Problem List   Diagnosis Date Noted  . Asthmatic bronchitis 01/30/2019  . Acute infectious diarrhea 11/27/2018  . Encounter for well woman exam with routine gynecological exam 11/14/2018  . Ganglion cyst of volar aspect of right wrist   . Allergic rhinitis 06/12/2018  . Panic attacks 02/06/2018  . Elevated glucose 02/06/2018  . Petechial rash 01/05/2018  . Epistaxis 01/05/2018  . Screening for colorectal cancer 10/24/2017  . Encounter for gynecological examination with Papanicolaou smear of cervix 10/24/2017  . Cough 06/12/2017  . Upper respiratory tract infection 06/12/2017  . Anxiety 11/14/2016  . UTI (urinary tract infection) 11/11/2015  . Diarrhea 11/11/2015  . GERD (gastroesophageal reflux disease) 10/23/2015  . BP check 09/24/2015  . Urge incontinence 08/19/2015  . Stye external 07/15/2015  . S/P cervical spinal fusion 06/05/2015  . Acute sinusitis 05/20/2015  . Fatigue 05/13/2015  . Edema 04/01/2015  . Chest pain 04/01/2015  . Nonspecific abnormal  electrocardiogram (ECG) (EKG) 04/01/2015  . 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 Neck pain, bilateral posterior 12/31/2014  . Lower back pain 12/31/2014  . Yeast infection 05/07/2014  . Obesity 05/07/2014  . Night sweats 04/14/2014  . Abdominal pain 11/25/2013  . Hormone replacement therapy (HRT) 08/21/2013  . Well adult exam 05/16/2013  . Hypertension     Past Medical History:  Diagnosis Date  . Abdominal pain 11/25/2013   Has some nausea associated with pain that has been on and off x 2 weeks will get [place]  . Anxiety    Takes Lorazepam  . Chronic bronchitis ([place])   . Gallstones   . GERD (gastroesophageal reflux disease)   . Hormone  replacement therapy (HRT)   . Hypertension   . Night sweats 04/14/2014  . Obesity 05/07/2014  . STD (sexually transmitted disease)    HSV ?carrier--never had an outbreak but exposed from ex-husband  . Urge incontinence 08/19/2015  . Yeast infection 05/07/2014    Past Surgical History:  Procedure Laterality Date  . ANTERIOR CERVICAL DECOMP/DISCECTOMY FUSION N/A 06/05/2015   Procedure: C5-6, C6-7 Anterior Cervical Discectomy and Fusion, Allograft, Plate;  Surgeon: [name], MD;  Location: [facility] [facility];  Service: Orthopedics;  Laterality: N/A;  . ANTERIOR FUSION CERVICAL SPINE  06/05/2015   C 5 6 7    . CHOLECYSTECTOMY    . COLONOSCOPY    . FOOT SURGERY Left   . GANGLION CYST EXCISION Right 07/09/2018   Procedure: EXCISION, RIGHT VOLAR WRIST GANGLION;  Surgeon: [name], MD;  Location: [facility];  Service: Orthopedics;  Laterality: Right;  . MOLE REMOVAL    . WISDOM TOOTH EXTRACTION      Current Outpatient Medications  Medication Sig Dispense Refill  . Ascorbic Acid (VITAMIN C) 100 MG tablet Take 100 mg by mouth daily.    . Cholecalciferol (VITAMIN D3) 5000 units TABS Take 2 tablets by mouth daily. Takes 2 daily     . cyclobenzaprine (FLEXERIL) 10 MG tablet Take 1 tablet (10 mg total) by mouth 3 (three) times daily as needed for muscle spasms. 60 tablet 0  . diphenhydrAMINE (BENADRYL) 25 MG tablet Take 2 tablets (50 mg total) by mouth every 4 (four) hours as needed (bee sting). 30 tablet 3  . LORazepam (ATIVAN) 0.5 MG tablet TAKE 1 TABLET BY MOUTH AT BEDTIME AS NEEDED 30 tablet 1  . medroxyPROGESTERone (PROVERA) 10 MG tablet Take 1 tablet a day for 10 days of the month 30 tablet 3  . olmesartan (BENICAR) 40 MG tablet TAKE 1 TABLET BY MOUTH EVERY DAY 30 tablet 1  . omeprazole (PRILOSEC) 40 MG capsule Take 1 capsule (40 mg total) by mouth daily. 90 capsule 3  . ondansetron (ZOFRAN ODT) 4 MG disintegrating tablet Take 1 tablet (4 mg total) by mouth every 8 (eight) hours as  needed for nausea or vomiting. 20 tablet 1  . Specialty Vitamins Products (MENOPAUSE SUPPORT) TABS Take 1 tablet by mouth daily.     [name] triamterene-hydrochlorothiazide (MAXZIDE-25) 37.5-25 MG tablet TAKE 1 TABLET BY MOUTH DAILY 90 tablet 3  . albuterol (PROAIR HFA) 108 (90 Base) MCG/ACT inhaler Inhale 2 puffs into the lungs every 6 (six) hours as needed for wheezing or shortness of breath. 18 g 3   No current facility-administered medications for this visit.   Facility-Administered Medications Ordered in Other Visits  Medication Dose Route Frequency Provider Last Rate Last Admin  . aminophylline injection 75 mg  75 mg Intravenous BID  PRN Dorothy Spark, MD   75 mg at 04/13/15 1037     ALLERGIES: Bee venom; Biaxin [clarithromycin]; Amlodipine; Betadine [povidone iodine]; Chloraprep one step [chlorhexidine gluconate]; Duraprep [antiseptic products, misc.]; and Keflex [cephalexin]  Family History  Problem Relation Age of Onset  . Breast cancer Mother 21       breast  . Diabetes Mother   . Fibromyalgia Mother   . Heart disease Mother        CHF  . Hypertension Mother   . Kidney disease Mother   . Heart disease Father   . Emphysema Father   . Diabetes Brother   . Hypertension Brother        x 2  . Heart disease Maternal Grandmother   . Diabetes Maternal Grandmother   . Asthma Maternal Grandmother   . Arthritis Maternal Grandmother   . Heart disease Maternal Grandfather   . Arthritis Maternal Grandfather   . Cancer Maternal Grandfather        bladder  . Breast cancer Paternal Grandmother   . Heart disease Paternal Grandmother   . Arthritis Paternal Grandmother   . Stroke Paternal Grandmother   . Ovarian cancer Paternal Grandmother   . Diabetes Paternal Grandfather   . Heart disease Paternal Grandfather   . Colon cancer Paternal Grandfather        mets  . Arthritis Paternal Grandfather   . Stomach cancer Paternal Grandfather        mets to stomach  . Hypertension Brother    . Esophageal cancer Neg Hx   . Pancreatic cancer Neg Hx   . Rectal cancer Neg Hx     Social History   Socioeconomic History  . Marital status: Married    Spouse name: [name]  . Number of children: 4  . Years of education: college  . Highest education level: Not on file  Occupational History  . 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 Smoking status: Never Smoker  . Smokeless tobacco: Never Used  Substance and Sexual Activity  . Alcohol use: Yes    Alcohol/week: 1.0 standard drinks    Types: 1 Glasses of wine per week  . Drug use: No  . Sexual activity: Yes    Birth control/protection: None, Post-menopausal  Other Topics Concern  . 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 Difficulty of Paying Living Expenses: Not on file  Food Insecurity:   . Worried About Charity fundraiser in the Last Year: Not on file  . Ran Out of Food in the Last Year: Not on file  Transportation Needs:   . Lack of Transportation (Medical): Not on file  . Lack of Transportation (Non-Medical): Not on file  Physical Activity:   . Days of Exercise per Week: Not on file  . Minutes of Exercise per Session: Not on file  Stress:   . Feeling of Stress : Not on file  Social Connections:   . Frequency of Communication with Friends and Family: Not on file  . Frequency of Social Gatherings with Friends and Family: Not on file  . Attends Religious Services: Not on file  . Active Member of Clubs or Organizations: Not on file  . Attends Archivist  Meetings: Not on file  . Marital Status: Not on file  Intimate Partner Violence:   . Fear of Current or Ex-Partner: Not on file  . Emotionally Abused: Not on file  . Physically Abused: Not on file  . Sexually Abused: Not on file     Review of Systems  All other systems reviewed and are negative.   PHYSICAL EXAMINATION:    BP 140/86 (Cuff Size: Large)   Pulse 70   Temp (!) 96.9 F (36.1 C) (Temporal)   Resp 16   Ht 5' 6.5" (1.689 m)   Wt 238 lb 9.6 oz (108.2 kg)   LMP 02/04/2018 (Approximate)   BMI 37.93 kg/m     General appearance: alert, cooperative and appears stated age Head: Normocephalic, without obvious abnormality, atraumatic Neck: no adenopathy, supple, symmetrical, trachea midline and thyroid normal to inspection and palpation Lungs: clear to auscultation bilaterally Heart: regular rate and rhythm Abdomen: soft, non-tender, no masses,  no organomegaly Extremities: extremities normal, atraumatic, no cyanosis or edema Skin: Skin color, texture, turgor normal. No rashes or lesions No abnormal inguinal nodes palpated Neurologic: Grossly normal  Pelvic: External genitalia:  no lesions              Urethra:  normal appearing urethra with no masses, tenderness or lesions              Bartholins and Skenes: normal                 Vagina: normal appearing vagina with normal color and discharge, no lesions              Cervix: no lesions                Bimanual Exam:  Uterus:  normal size, contour, position, consistency, mobility, non-tender              Adnexa: no mass, fullness, tenderness              Rectal exam: Yes.  .  Confirms.              Anus:  normal sphincter tone, no lesions  Chaperone was present for exam.  ASSESSMENT  Postmenopausal bleeding.   Proliferative endometrium.  On cyclic Provera. FH malignancies - maternal and paternal sides of the family.  Hx laparoscopic cholecystectomy.   PLAN  Pap and HR HPV.  Berino and E2.  We discussed proliferative endometrium, hyperplasia and endometrial cancer. Declines a Mirena IUD.  We discussed hysterectomy as an option for treatment. ACOG HO to patient.  Return for follow up EMB. Will then do a My Chart visit chat to include her  husband.  Declines genetic counseling and testing but will discuss further with her husband.    An After Visit Summary was printed and given to the patient.  _45____ minutes face to face time of which over 50% was spent in counseling.

## 2019-06-12 LAB — CYTOLOGY - PAP
Comment: NEGATIVE
Diagnosis: NEGATIVE
High risk HPV: NEGATIVE

## 2019-06-12 LAB — ESTRADIOL: Estradiol: 31.1 pg/mL

## 2019-06-12 LAB — FOLLICLE STIMULATING HORMONE: FSH: 47.3 m[IU]/mL

## 2019-06-19 ENCOUNTER — Telehealth: Payer: Self-pay | Admitting: Obstetrics and Gynecology

## 2019-06-19 NOTE — Telephone Encounter (Signed)
Call placed to convey benefits for endometrial biopsy. Unable to leave a message voicemail is full.

## 2019-06-22 ENCOUNTER — Encounter: Payer: Self-pay | Admitting: Obstetrics and Gynecology

## 2019-06-24 ENCOUNTER — Telehealth: Payer: Self-pay | Admitting: Obstetrics and Gynecology

## 2019-06-24 NOTE — Telephone Encounter (Signed)
Routing to [employer] and [employer]  for [employer].

## 2019-06-24 NOTE — Telephone Encounter (Signed)
I agree with the office visit and re-evaluation of her pain.   She may need further imaging with a CT scan to rule out other causes of her pain.

## 2019-06-24 NOTE — Telephone Encounter (Signed)
Spoke with patient. Patient reports intermittent sharp pain that varies from left to right side of pelvis, started a few weeks ago. Doubles over when the pain occurs. Abdomen non tender or distended. Denies any pain at this time. Denies N/V, fever/chills, urinary symptoms, vaginal bleeding or odor. BMs are normal per pt. Takes cyclic provera, took last dose on 1/10, light menses followed. Patient states she has 3 wks with the intermittent pain and one week without. Patient was seen in office on 06/11/19 for PMB, labs indicated she could be perimenopausal, needs f/u EMB. Last PUS 04/15/19.   OV scheduled for further evaluation with EMB on 1/20 at 11am with Dr. Quincy Simmonds. Advised patient to return call if symptoms worsen or pain is severe, ER after hours. Advised I will review with Dr. Quincy Simmonds and return call if any additional recommendations. Patient agreeable.   Dr. Quincy Simmonds -please review.

## 2019-06-24 NOTE — Telephone Encounter (Signed)
Patient sent the following message through [facility]. Routing to triage to assist patient with request.  Caroline Wong, Caroline Wong Clinical Pool  Phone Number: [phone]  Good evening! (Saturday 6:48)  Dr Quincy Simmonds,  I was going to wait til my next visit but my husband insists I contact you now. I have been experiencing sharp pains in my ovaries. Almost like someone stabbing me. I know it's from my ovaries because it is both sides and just above my pelvic bone. Sometimes so sharp I can hardly stand up. My husband and I agree something needs to be done preferably as quick as possible.    [name]

## 2019-06-24 NOTE — Telephone Encounter (Signed)
Recommendations reviewed.   Encounter closed.

## 2019-06-26 ENCOUNTER — Other Ambulatory Visit (HOSPITAL_COMMUNITY)
Admission: RE | Admit: 2019-06-26 | Discharge: 2019-06-26 | Disposition: A | Discharge status: Home / Self Care | Payer: Federal, State, Local not specified - PPO | Source: Ambulatory Visit | Attending: Obstetrics and Gynecology | Admitting: Obstetrics and Gynecology

## 2019-06-26 ENCOUNTER — Encounter: Payer: Self-pay | Admitting: Obstetrics and Gynecology

## 2019-06-26 ENCOUNTER — Ambulatory Visit: Payer: Federal, State, Local not specified - PPO | Admitting: Obstetrics and Gynecology

## 2019-06-26 ENCOUNTER — Other Ambulatory Visit: Payer: Self-pay

## 2019-06-26 VITALS — BP 150/98 | HR 64 | Temp 96.6°F | Ht 66.5 in | Wt 238.0 lb

## 2019-06-26 DIAGNOSIS — N924 Excessive bleeding in the premenopausal period: Secondary | ICD-10-CM | POA: Diagnosis not present

## 2019-06-26 DIAGNOSIS — O26899 Other specified pregnancy related conditions, unspecified trimester: Secondary | ICD-10-CM | POA: Diagnosis not present

## 2019-06-26 DIAGNOSIS — R102 Pelvic and perineal pain: Secondary | ICD-10-CM

## 2019-06-26 DIAGNOSIS — N950 Postmenopausal bleeding: Secondary | ICD-10-CM

## 2019-06-26 DIAGNOSIS — N840 Polyp of corpus uteri: Secondary | ICD-10-CM | POA: Diagnosis not present

## 2019-06-26 NOTE — Patient Instructions (Signed)

## 2019-06-26 NOTE — Progress Notes (Signed)
GYNECOLOGY  VISIT   HPI: 59 y.o.   Married  Caucasian  female   G2P2002 with Patient's last menstrual period was 02/04/2018 (approximate).   here for EMB.    She has proliferative endometrium noted on a prior EMB done as part of a work up for postmenopausal bleeding, which occurred just after a year of after having no menstrual cycle.  She also has fibroids.  She was treated with cyclic Provera by Dr. Elonda Husky.  She had her last Provera dose on 06/16/19.  She started spotted on 06/17/19 and bled until 06/21/19.  She has lower abdominal pain that she cannot explain, that predates her bleeding.  She has sharp pains in her lower abdomen, worse with her bleeding.  She states she has one good week per month.   She did have a CT of the abdomen and pelvis in June, 2020.  She was dx with colitis and she was treated for campylobacter infection she states was related to raising chickens.    She had hormonal testing on 06/11/19 and her estradiol was 31.1 and FSH was 47.3.  Patient's sister has endometrial cancer, and the patient does have concerns about this.   GYNECOLOGIC HISTORY: Patient's last menstrual period was 02/04/2018 (approximate). Contraception:  Postmenopausal Menopausal hormone therapy: Provera Last mammogram:  10-30-18 3D/Neg/density /B/BiRads1--Diag.Ctr [facility] Last pap smear:  06-11-19 Neg:Neg HR HPV,10-24-17 Neg:Neg HR HPV,08-19-15 Neg:Neg HR HPV        OB History    Gravida  2   Para  2   Term  2   Preterm      AB      Living  2     SAB      TAB      Ectopic      Multiple      Live Births  2              Patient Active Problem List   Diagnosis Date Noted  . Asthmatic bronchitis 01/30/2019  . Acute infectious diarrhea 11/27/2018  . Encounter for well woman exam with routine gynecological exam 11/14/2018  . Ganglion cyst of volar aspect of right wrist   . Allergic rhinitis 06/12/2018  . Panic attacks 02/06/2018  . Elevated glucose 02/06/2018  .  Petechial rash 01/05/2018  . Epistaxis 01/05/2018  . Screening for colorectal cancer 10/24/2017  . Encounter for gynecological examination with Papanicolaou smear of cervix 10/24/2017  . Cough 06/12/2017  . Upper respiratory tract infection 06/12/2017  . Anxiety 11/14/2016  . UTI (urinary tract infection) 11/11/2015  . Diarrhea 11/11/2015  . GERD (gastroesophageal reflux disease) 10/23/2015  . BP check 09/24/2015  . Urge incontinence 08/19/2015  . Stye external 07/15/2015  . S/P cervical spinal fusion 06/05/2015  . Acute sinusitis 05/20/2015  . Fatigue 05/13/2015  . Edema 04/01/2015  . Chest pain 04/01/2015  . Nonspecific abnormal electrocardiogram (ECG) (EKG) 04/01/2015  . 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 Neck pain, bilateral posterior 12/31/2014  . Lower back pain 12/31/2014  . Yeast infection 05/07/2014  . Obesity 05/07/2014  . Night sweats 04/14/2014  . Abdominal pain 11/25/2013  . Hormone replacement therapy (HRT) 08/21/2013  . Well adult exam 05/16/2013  . Hypertension     Past Medical History:  Diagnosis Date  . Abdominal pain 11/25/2013   Has some nausea associated with pain that has been on and off x 2 weeks will get [place]  . Anxiety    Takes Lorazepam  . Chronic bronchitis ([place])   .  Gallstones   . GERD (gastroesophageal reflux disease)   . Hormone replacement therapy (HRT)   . Hypertension   . Night sweats 04/14/2014  . Obesity 05/07/2014  . STD (sexually transmitted disease)    HSV ?carrier--never had an outbreak but exposed from ex-husband  . Urge incontinence 08/19/2015  . Yeast infection 05/07/2014    Past Surgical History:  Procedure Laterality Date  . ANTERIOR CERVICAL DECOMP/DISCECTOMY FUSION N/A 06/05/2015   Procedure: C5-6, C6-7 Anterior Cervical Discectomy and Fusion, Allograft, Plate;  Surgeon: [name], MD;  Location: [facility];  Service: Orthopedics;  Laterality: N/A;  . ANTERIOR FUSION CERVICAL SPINE  06/05/2015   C 5 6 7    .  CHOLECYSTECTOMY    . COLONOSCOPY    . FOOT SURGERY Left   . GANGLION CYST EXCISION Right 07/09/2018   Procedure: EXCISION, RIGHT VOLAR WRIST GANGLION;  Surgeon: [name], MD;  Location: [facility];  Service: Orthopedics;  Laterality: Right;  . MOLE REMOVAL    . WISDOM TOOTH EXTRACTION      Current Outpatient Medications  Medication Sig Dispense Refill  . albuterol (PROAIR HFA) 108 (90 Base) MCG/ACT inhaler Inhale 2 puffs into the lungs every 6 (six) hours as needed for wheezing or shortness of breath. 18 g 3  . Ascorbic Acid (VITAMIN C) 100 MG tablet Take 100 mg by mouth daily.    . Cholecalciferol (VITAMIN D3) 5000 units TABS Take 2 tablets by mouth daily. Takes 2 daily     . cyclobenzaprine (FLEXERIL) 10 MG tablet Take 1 tablet (10 mg total) by mouth 3 (three) times daily as needed for muscle spasms. 60 tablet 0  . diphenhydrAMINE (BENADRYL) 25 MG tablet Take 2 tablets (50 mg total) by mouth every 4 (four) hours as needed (bee sting). 30 tablet 3  . LORazepam (ATIVAN) 0.5 MG tablet TAKE 1 TABLET BY MOUTH AT BEDTIME AS NEEDED 30 tablet 1  . medroxyPROGESTERone (PROVERA) 10 MG tablet Take 1 tablet a day for 10 days of the month 30 tablet 3  . olmesartan (BENICAR) 40 MG tablet TAKE 1 TABLET BY MOUTH EVERY DAY 30 tablet 1  . omeprazole (PRILOSEC) 40 MG capsule Take 1 capsule (40 mg total) by mouth daily. 90 capsule 3  . ondansetron (ZOFRAN ODT) 4 MG disintegrating tablet Take 1 tablet (4 mg total) by mouth every 8 (eight) hours as needed for nausea or vomiting. 20 tablet 1  . Specialty Vitamins Products (MENOPAUSE SUPPORT) TABS Take 1 tablet by mouth daily.     [name] triamterene-hydrochlorothiazide (MAXZIDE-25) 37.5-25 MG tablet TAKE 1 TABLET BY MOUTH DAILY 90 tablet 3   No current facility-administered medications for this visit.   Facility-Administered Medications Ordered in Other Visits  Medication Dose Route Frequency Provider Last Rate Last Admin  . aminophylline  injection 75 mg  75 mg Intravenous BID PRN Dorothy Spark, MD   75 mg at 04/13/15 1037     ALLERGIES: Bee venom; Biaxin [clarithromycin]; Amlodipine; Betadine [povidone iodine]; Chloraprep one step [chlorhexidine gluconate]; Duraprep [antiseptic products, misc.]; and Keflex [cephalexin]  Family History  Problem Relation Age of Onset  . Breast cancer Mother 35       breast  . Diabetes Mother   . Fibromyalgia Mother   . Heart disease Mother        CHF  . Hypertension Mother   . Kidney disease Mother   . Heart disease Father   . Emphysema Father   . Diabetes Brother   .  Hypertension Brother        x 2  . Heart disease Maternal Grandmother   . Diabetes Maternal Grandmother   . Asthma Maternal Grandmother   . Arthritis Maternal Grandmother   . Heart disease Maternal Grandfather   . Arthritis Maternal Grandfather   . Cancer Maternal Grandfather        bladder  . Breast cancer Paternal Grandmother   . Heart disease Paternal Grandmother   . Arthritis Paternal Grandmother   . Stroke Paternal Grandmother   . Ovarian cancer Paternal Grandmother   . Diabetes Paternal Grandfather   . Heart disease Paternal Grandfather   . Colon cancer Paternal Grandfather        mets  . Arthritis Paternal Grandfather   . Stomach cancer Paternal Grandfather        mets to stomach  . Hypertension Brother   . Cancer Sister        ?endometrial ca  . Esophageal cancer Neg Hx   . Pancreatic cancer Neg Hx   . Rectal cancer Neg Hx     Social History   Socioeconomic History  . Marital status: Married    Spouse name: [name]  . Number of children: 4  . Years of education: college  . Highest education level: Not on file  Occupational History  . 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 Smoking status: Never Smoker  . Smokeless tobacco: Never Used  Substance and Sexual Activity  . Alcohol use: Yes    Alcohol/week: 1.0 standard drinks    Types: 1  Glasses of wine per week  . Drug use: No  . Sexual activity: Yes    Birth control/protection: None, Post-menopausal  Other Topics Concern  . 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 Difficulty of Paying Living Expenses: Not on file  Food Insecurity:   . Worried About Charity fundraiser in the Last Year: Not on file  . Ran Out of Food in the Last Year: Not on file  Transportation Needs:   . Lack of Transportation (Medical): Not on file  . Lack of Transportation (Non-Medical): Not on file  Physical Activity:   . Days of Exercise per Week: Not on file  . Minutes of Exercise per Session: Not on file  Stress:   . Feeling of Stress : Not on file  Social Connections:   . Frequency of Communication with Friends and Family: Not on file  . Frequency of Social Gatherings with Friends and Family: Not on file  . Attends Religious Services: Not on file  . Active Member of Clubs or Organizations: Not on file  . Attends Archivist Meetings: Not on file  . Marital Status: Not on file  Intimate Partner Violence:   . Fear of Current or Ex-Partner: Not on file  . Emotionally Abused: Not on file  . Physically Abused: Not on file  . Sexually Abused: Not on file    Review of Systems  All other systems reviewed and are negative.   PHYSICAL EXAMINATION:    BP (!) 150/98 (Cuff Size: Large)   Pulse 64   Temp (!) 96.6 F (35.9 C) (Temporal)   Ht 5' 6.5" (1.689 m)   Wt 238  lb (108 kg)   LMP 02/04/2018 (Approximate)   BMI 37.84 kg/m     General appearance: alert, cooperative and appears stated age  EMB - Consent for procedure.  [employer] sterile prep.  Paracervical withl 1% lidocaine - [id number], expiration 4/22. Tenaculum to anterior cervical lip.  EMB x 2 to 8 cm. Tissue to pathology.  No  complications.  Minimal EBL.  Chaperone was present for exam.  ASSESSMENT  Abnormal perimenopausal bleeding.  Proliferative endometrium.  Not tolerating Provera.  Pelvic pain of uncertain etiology.  Elevated blood pressure.  On medication.  Multiple allergies to surgical scrubs.  Needs [employer] prep. Diabetes currently not on medication.  Hx colitis.  Treated for campylobacter.    PLAN  FU EMB. Post biopsy precautions given.  My Chart video visit in 7 - 8 days to discuss results and plan.    An After Visit Summary was printed and given to the patient.

## 2019-06-28 LAB — SURGICAL PATHOLOGY

## 2019-07-02 ENCOUNTER — Encounter: Payer: Federal, State, Local not specified - PPO | Admitting: Internal Medicine

## 2019-07-02 MED ORDER — TRAMADOL HCL 50 MG PO TABS: 50.0000 mg | ORAL_TABLET | Freq: Four times a day (QID) | ORAL | 0 refills | Status: AC | PRN

## 2019-07-02 MED ORDER — IBUPROFEN 600 MG PO TABS: ORAL_TABLET | ORAL | 1 refills | Status: DC

## 2019-07-03 ENCOUNTER — Telehealth: Payer: Self-pay | Admitting: Obstetrics and Gynecology

## 2019-07-03 ENCOUNTER — Encounter: Payer: Self-pay | Admitting: Obstetrics and Gynecology

## 2019-07-03 ENCOUNTER — Telehealth (INDEPENDENT_AMBULATORY_CARE_PROVIDER_SITE_OTHER): Payer: Federal, State, Local not specified - PPO | Admitting: Obstetrics and Gynecology

## 2019-07-03 ENCOUNTER — Other Ambulatory Visit: Payer: Self-pay

## 2019-07-03 DIAGNOSIS — N840 Polyp of corpus uteri: Secondary | ICD-10-CM | POA: Diagnosis not present

## 2019-07-03 DIAGNOSIS — N951 Menopausal and female climacteric states: Secondary | ICD-10-CM

## 2019-07-03 DIAGNOSIS — R102 Pelvic and perineal pain: Secondary | ICD-10-CM

## 2019-07-03 NOTE — Telephone Encounter (Signed)
Call placed to patient to review benefit for recommended ultrasound. Patient acknowledges understanding of information presented. Patient is scheduled 07/11/2019 with Dr Quincy Simmonds. Patient offered earlier appointment dates, including 07/04/2019,but declined, due to other appointments with her spouse. Patient is aware of the appointment date, arrival time and cancellation policy. No further questions.  Forwarding to Dr Quincy Simmonds for final review. Patient is agreeable to disposition. Will close encounter   cc: Lamont Snowball, RN        Reesa Chew, RN

## 2019-07-03 NOTE — Progress Notes (Signed)
GYNECOLOGY  VISIT   HPI: 59 y.o.   Married  Caucasian  female   G2P2002 with Patient's last menstrual period was 02/04/2018 (approximate).   here for follow up to discuss EMB results.    She gives permission.  I am in my office.  She is in the living room with her husband.  Finished at 2:57, started at 2:35  Her EMB from 06/26/19 showed a benign endometrial polyp.   The following is taken from her visit on 06/26/19; She has proliferative endometrium noted on a prior EMB done as part of a work up for postmenopausal bleeding, which occurred just after a year of after having no menstrual cycle.  She also has fibroids.  Pelvic US at Dr. Brynda Greathouse office 04/15/19 showed 1.4 x 0.9 x 1.1 cm anterior intramural fibroid. She was treated with cyclic Provera by Dr. Elonda Husky.  She had her last Provera dose on 06/16/19.  She started spotted on 06/17/19 and bled until 06/21/19.  She has lower abdominal pain that she cannot explain, that predates her bleeding.  She has sharp pains in her lower abdomen, worse with her bleeding.  She states she has one good week per month.   She did have a CT of the abdomen and pelvis in June, 2020.  She was dx with colitis and she was treated for campylobacter infection she states was related to raising chickens.    She had hormonal testing on 06/11/19 and her estradiol was 31.1 and FSH was 47.3.  Patient's sister has endometrial cancer, and the patient does have concerns about this.   Patient is concerned about her random lower abdominal pain that occurs for at least one year and it is progressively getting worse.  It can be right, left or both sides.  It occurs without warning and there is no predictor to make this happen.  This can last for seconds up to several minutes.  She has no other symptoms.  No nausea.  Nothing makes it better.   GYNECOLOGIC HISTORY: Patient's last menstrual period was 02/04/2018 (approximate). Contraception: PMP Menopausal hormone  therapy:  Provera Last mammogram: 10-30-18 3D/Neg/density /B/BiRads1--[facility] Last pap smear: 06-11-19 Neg:Neg HR HPV,10-24-17 Neg:Neg HR HPV,08-19-15 Neg:Neg HR HPV        OB History    Gravida  2   Para  2   Term  2   Preterm      AB      Living  2     SAB      TAB      Ectopic      Multiple      Live Births  2              Patient Active Problem List   Diagnosis Date Noted  . Asthmatic bronchitis 01/30/2019  . Acute infectious diarrhea 11/27/2018  . Encounter for well woman exam with routine gynecological exam 11/14/2018  . Ganglion cyst of volar aspect of right wrist   . Allergic rhinitis 06/12/2018  . Panic attacks 02/06/2018  . Elevated glucose 02/06/2018  . Petechial rash 01/05/2018  . Epistaxis 01/05/2018  . Screening for colorectal cancer 10/24/2017  . Encounter for gynecological examination with Papanicolaou smear of cervix 10/24/2017  . Cough 06/12/2017  . Upper respiratory tract infection 06/12/2017  . Anxiety 11/14/2016  . UTI (urinary tract infection) 11/11/2015  . Diarrhea 11/11/2015  . GERD (gastroesophageal reflux disease) 10/23/2015  . BP check 09/24/2015  . Urge incontinence 08/19/2015  . Stye  external 07/15/2015  . S/P cervical spinal fusion 06/05/2015  . Acute sinusitis 05/20/2015  . Fatigue 05/13/2015  . Edema 04/01/2015  . Chest pain 04/01/2015  . Nonspecific abnormal electrocardiogram (ECG) (EKG) 04/01/2015  . 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 Neck pain, bilateral posterior 12/31/2014  . Lower back pain 12/31/2014  . Yeast infection 05/07/2014  . Obesity 05/07/2014  . Night sweats 04/14/2014  . Abdominal pain 11/25/2013  . Hormone replacement therapy (HRT) 08/21/2013  . Well adult exam 05/16/2013  . Hypertension     Past Medical History:  Diagnosis Date  . Abdominal pain 11/25/2013   Has some nausea associated with pain that has been on and off x 2 weeks will get [place]  . Anxiety    Takes Lorazepam   . Chronic bronchitis ([place])   . Gallstones   . GERD (gastroesophageal reflux disease)   . Hormone replacement therapy (HRT)   . Hypertension   . Night sweats 04/14/2014  . Obesity 05/07/2014  . STD (sexually transmitted disease)    HSV ?carrier--never had an outbreak but exposed from ex-husband  . Urge incontinence 08/19/2015  . Yeast infection 05/07/2014    Past Surgical History:  Procedure Laterality Date  . ANTERIOR CERVICAL DECOMP/DISCECTOMY FUSION N/A 06/05/2015   Procedure: C5-6, C6-7 Anterior Cervical Discectomy and Fusion, Allograft, Plate;  Surgeon: [name], MD;  Location: [facility];  Service: Orthopedics;  Laterality: N/A;  . ANTERIOR FUSION CERVICAL SPINE  06/05/2015   C 5 6 7    . CHOLECYSTECTOMY    . COLONOSCOPY    . FOOT SURGERY Left   . GANGLION CYST EXCISION Right 07/09/2018   Procedure: EXCISION, RIGHT VOLAR WRIST GANGLION;  Surgeon: [name], MD;  Location: [facility];  Service: Orthopedics;  Laterality: Right;  . MOLE REMOVAL    . WISDOM TOOTH EXTRACTION      Current Outpatient Medications  Medication Sig Dispense Refill  . albuterol (PROAIR HFA) 108 (90 Base) MCG/ACT inhaler Inhale 2 puffs into the lungs every 6 (six) hours as needed for wheezing or shortness of breath. 18 g 3  . Ascorbic Acid (VITAMIN C) 100 MG tablet Take 100 mg by mouth daily.    . Cholecalciferol (VITAMIN D3) 5000 units TABS Take 2 tablets by mouth daily. Takes 2 daily     . cyclobenzaprine (FLEXERIL) 10 MG tablet Take 1 tablet (10 mg total) by mouth 3 (three) times daily as needed for muscle spasms. 60 tablet 0  . diphenhydrAMINE (BENADRYL) 25 MG tablet Take 2 tablets (50 mg total) by mouth every 4 (four) hours as needed (bee sting). 30 tablet 3  . ibuprofen (ADVIL) 600 MG tablet Take twice a day x 1 week, then prn pain 60 tablet 1  . LORazepam (ATIVAN) 0.5 MG tablet TAKE 1 TABLET BY MOUTH AT BEDTIME AS NEEDED 30 tablet 1  . medroxyPROGESTERone (PROVERA) 10 MG tablet Take 1  tablet a day for 10 days of the month 30 tablet 3  . olmesartan (BENICAR) 40 MG tablet TAKE 1 TABLET BY MOUTH EVERY DAY 30 tablet 1  . omeprazole (PRILOSEC) 40 MG capsule Take 1 capsule (40 mg total) by mouth daily. 90 capsule 3  . ondansetron (ZOFRAN ODT) 4 MG disintegrating tablet Take 1 tablet (4 mg total) by mouth every 8 (eight) hours as needed for nausea or vomiting. 20 tablet 1  . Specialty Vitamins Products (MENOPAUSE SUPPORT) TABS Take 1 tablet by mouth daily.     . traMADol (  ULTRAM) 50 MG tablet Take 1 tablet (50 mg total) by mouth every 6 (six) hours as needed for up to 5 days for severe pain. 20 tablet 0  . triamterene-hydrochlorothiazide (MAXZIDE-25) 37.5-25 MG tablet TAKE 1 TABLET BY MOUTH DAILY 90 tablet 3   No current facility-administered medications for this visit.   Facility-Administered Medications Ordered in Other Visits  Medication Dose Route Frequency Provider Last Rate Last Admin  . aminophylline injection 75 mg  75 mg Intravenous BID PRN Dorothy Spark, MD   75 mg at 04/13/15 1037     ALLERGIES: Bee venom; Biaxin [clarithromycin]; Amlodipine; Betadine [povidone iodine]; Chloraprep one step [chlorhexidine gluconate]; Duraprep [antiseptic products, misc.]; and Keflex [cephalexin]  Family History  Problem Relation Age of Onset  . Breast cancer Mother 77       breast  . Diabetes Mother   . Fibromyalgia Mother   . Heart disease Mother        CHF  . Hypertension Mother   . Kidney disease Mother   . Heart disease Father   . Emphysema Father   . Diabetes Brother   . Hypertension Brother        x 2  . Heart disease Maternal Grandmother   . Diabetes Maternal Grandmother   . Asthma Maternal Grandmother   . Arthritis Maternal Grandmother   . Heart disease Maternal Grandfather   . Arthritis Maternal Grandfather   . Cancer Maternal Grandfather        bladder  . Breast cancer Paternal Grandmother   . Heart disease Paternal Grandmother   . Arthritis Paternal  Grandmother   . Stroke Paternal Grandmother   . Ovarian cancer Paternal Grandmother   . Diabetes Paternal Grandfather   . Heart disease Paternal Grandfather   . Colon cancer Paternal Grandfather        mets  . Arthritis Paternal Grandfather   . Stomach cancer Paternal Grandfather        mets to stomach  . Hypertension Brother   . Cancer Sister        ?endometrial ca  . Esophageal cancer Neg Hx   . Pancreatic cancer Neg Hx   . Rectal cancer Neg Hx     Social History   Socioeconomic History  . Marital status: Married    Spouse name: [name]  . Number of children: 4  . Years of education: college  . Highest education level: Not on file  Occupational History  . 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 Smoking status: Never Smoker  . Smokeless tobacco: Never Used  Substance and Sexual Activity  . Alcohol use: Yes    Alcohol/week: 1.0 standard drinks    Types: 1 Glasses of wine per week  . Drug use: No  . Sexual activity: Yes    Birth control/protection: None, Post-menopausal  Other Topics Concern  . 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 Difficulty of Paying Living Expenses: Not on file  Food Insecurity:   . Worried About Charity fundraiser in the Last Year: Not on file  . Ran Out of Food in the Last Year: Not on file  Transportation Needs:   . Lack of Transportation (Medical): Not on file  . Lack  of Transportation (Non-Medical): Not on file  Physical Activity:   . Days of Exercise per Week: Not on file  . Minutes of Exercise per Session: Not on file  Stress:   . Feeling of Stress : Not on file  Social Connections:   . Frequency of Communication with Friends and Family: Not on file  . Frequency of Social Gatherings with Friends and Family:  Not on file  . Attends Religious Services: Not on file  . Active Member of Clubs or Organizations: Not on file  . Attends Archivist Meetings: Not on file  . Marital Status: Not on file  Intimate Partner Violence:   . Fear of Current or Ex-Partner: Not on file  . Emotionally Abused: Not on file  . Physically Abused: Not on file  . Sexually Abused: Not on file    Review of Systems  All other systems reviewed and are negative.   PHYSICAL EXAMINATION:    LMP 02/04/2018 (Approximate)     General appearance: alert, cooperative and appears stated age   Chaperone was present for exam.  ASSESSMENT  Abnormal perimenopausal bleeding.  Hx proliferative endometrium. Endometrial polyp on current EMB. Not tolerating Provera.  Fibroid. Small and intramural.  Pelvic pain of uncertain etiology.  Elevated blood pressure.  On medication.  Multiple allergies to surgical scrubs.  Needs [employer] prep. Diabetes currently not on medication.  Hx colitis.  Treated for campylobacter.  FH endometrial cancer in patient's sister and breast cancer in patient's mother.  PLAN  Options for care:  Sonohysterogram, hysteroscopy with dilation and curettage and polyp removal, laparoscopic hysterectomy.  She understands that  She wishes to proceed with sonohysterogram.  Procedure explained.  OK to stop Provera.  She will reach out to GI for re-evaluation.  We will revisit potential genetic testing at a future time per her request.    An After Visit Summary was printed and given to the patient.  _22_____ minutes face to face time of which over 50% was spent in counseling.

## 2019-07-08 MED ORDER — OLMESARTAN MEDOXOMIL 40 MG PO TABS: 40.0000 mg | ORAL_TABLET | Freq: Every day | ORAL | 1 refills | Status: DC

## 2019-07-11 ENCOUNTER — Encounter: Payer: Self-pay | Admitting: Obstetrics and Gynecology

## 2019-07-11 ENCOUNTER — Ambulatory Visit (INDEPENDENT_AMBULATORY_CARE_PROVIDER_SITE_OTHER): Payer: Federal, State, Local not specified - PPO | Admitting: Obstetrics and Gynecology

## 2019-07-11 ENCOUNTER — Other Ambulatory Visit: Payer: Self-pay | Admitting: Obstetrics and Gynecology

## 2019-07-11 ENCOUNTER — Other Ambulatory Visit: Payer: Self-pay

## 2019-07-11 ENCOUNTER — Ambulatory Visit (INDEPENDENT_AMBULATORY_CARE_PROVIDER_SITE_OTHER): Payer: Federal, State, Local not specified - PPO

## 2019-07-11 VITALS — BP 140/84 | HR 70 | Temp 97.1°F | Ht 66.5 in | Wt 244.0 lb

## 2019-07-11 DIAGNOSIS — N840 Polyp of corpus uteri: Secondary | ICD-10-CM

## 2019-07-11 DIAGNOSIS — Z809 Family history of malignant neoplasm, unspecified: Secondary | ICD-10-CM

## 2019-07-11 DIAGNOSIS — R109 Unspecified abdominal pain: Secondary | ICD-10-CM

## 2019-07-11 NOTE — Patient Instructions (Addendum)
Lynch Syndrome Lynch syndrome, also called hereditary nonpolyposis colorectal cancer (HNPCC), is a condition that increases a person's risk for developing colorectal cancer before age 57. Lynch syndrome can also increase a person's risk for many other types of cancer, including stomach, small intestine, liver, gallbladder, pancreas, urinary tract, skin, and brain cancers. Women with this condition have a higher risk for developing cancer of the ovaries and cancer in the lining of the uterus (endometrium). What are the causes? This condition is caused by a gene mutation that is inherited from one or both parents. A gene mutation is a harmful change in a gene. Not everyone who inherits the genetic mutation develops cancer. What are the signs or symptoms? There are no symptoms of this condition. However, your health care provider may test you for Lynch syndrome if you:  Have colorectal cancer before age 59.  Have family members diagnosed with colorectal, endometrial, or other types of cancer. How is this diagnosed? This condition is diagnosed with:  A review of your family history of cancer.  A blood test to look for the mutations that cause this condition.  Testing of tumor tissue (biopsy). How is this treated? This condition may be managed with:  Genetic counseling to assess your risk and your options for management.  Regular screening tests for the associated cancers. You may need to have a colonoscopy every 1-2 years starting at an early age.  Daily aspirin therapy.  Preventive surgery to remove sites where cancer can develop, such as the colon, uterus, and ovaries. Follow these instructions at home:   Ask your health care provider about your risks.  Discuss a referral for genetic counseling. Ask about the risks and benefits of genetic counseling.  Write down any questions you have about your condition.  Follow your plan for cancer screenings as told by your health care  provider.  Take over-the-counter and prescription medicines only as told by your health care provider.  Maintain a healthy diet.  Consider joining a support group. This may help you learn to cope with the stress of having Lynch syndrome.  Keep all follow-up visits as told by your health care provider. This is important. Contact a health care provider if:  You develop any new or unusual symptoms.  You develop symptoms of colorectal cancer, such as: ? Blood in the stool. ? Changes in bowel habits. ? Abdominal pain or bloating. ? Unexplained weight loss. Summary  Lynch syndrome is caused by an inherited gene mutation. Not everyone who inherits this mutation will develop cancer.  Genetic counseling and blood testing for Lynch syndrome can identify people with the condition.  Regular cancer screening tests are important in managing this condition. This information is not intended to replace advice given to you by your health care provider. Make sure you discuss any questions you have with your health care provider. Document Revised: 01/21/2016 Document Reviewed: 01/15/2016 Elsevier Patient Education  [place] Why am I having this test? BRCA gene testing is done to check for the presence of harmful changes (mutations) in the BRCA1 gene or the BRCA2 gene (breast cancer susceptibility genes). If there is a mutation, the genes may not be able to help repair damaged cells in the body. As a result, the damaged cells may develop defects that can lead to certain types of cancer. You may have this test if you have a family history of certain types of cancer, including cancer of the:  Breast.  Ovaries.  Fallopian tubes.  Peritoneum.  Pancreas.  Prostate. What kind of sample is taken?     The test requires either a sample of blood or a sample of cells from your saliva. If a sample of blood is needed, it will probably be collected by inserting a needle into  a vein. If a sample of saliva is needed, you will get instructions about how to collect the sample. What do the results mean? The test results can show whether you have a mutation in the BRCA1 or BRCA2 gene that increases your risk for certain cancers. Meaning of negative test results A negative test result means that you do not have a mutation in the BRCA1 or BRCA2 gene that is known to increase your risk for certain cancers. This does not mean that you will never get cancer. Talk with your health care provider or a genetic counselor about what this result means for you. Meaning of positive test results A positive test result means that you have a mutation in the BRCA1 or BRCA2 gene that increases your risk for certain cancers. Women with a positive test result have an increased risk for breast and ovarian cancer. Both women and men with a mutation have an increased risk for breast cancer and may be at greater risk for other types of cancer. Getting a positive test result does not mean that you will develop cancer. Talk with your health care provider or a genetic counselor about what this result means for you. You may be told that you are a carrier. This means that you can pass the mutation to your children. Meaning of ambiguous test results Ambiguous, inconclusive, or uncertain test results mean that there is a change in the BRCA1 or BRCA2 gene, but it is a change that has not been linked to cancer. Talk with your health care provider or a genetic counselor about what this result means for you. Talk with your health care provider to discuss your results, treatment options, and if necessary, the need for more tests. Talk with your health care provider if you have any questions about your results. How do I get my results? It is up to you to get your test results. Ask your health care provider, or the department that is doing the test, when your results will be ready. This information is not intended to  replace advice given to you by your health care provider. Make sure you discuss any questions you have with your health care provider. Document Revised: 12/18/2017 Document Reviewed: 01/13/2016 Elsevier Patient Education  2020 [place] counseling is a meeting that helps you understand certain conditions that can be passed from parent to child (inherited). These conditions may be related to:  The units that carry inherited information (genes).  The structures that carry genes (chromosomes). During the meeting, you will talk with a specialist who is trained in inherited conditions (geneticist or genetic counselor). Genetic counseling will help you understand your risk for:  Genetic disorders. These are conditions or diseases that are caused by changes (mutations) in genes.  Chromosomal disorders. These are conditions or diseases that are caused by a change in one or more chromosomes. Who should have genetic counseling? People who have children, are pregnant, or wish to become pregnant You may benefit from genetic counseling if you:  Have a child with a genetic or chromosomal condition.  Are concerned about passing an inherited condition to your child.  Have had two or more failed pregnancies (  miscarriages) or stillbirths and you wish to become pregnant again.  Are pregnant and you are 21 years of age or older. This is when the risk of chromosomal changes increases.  Are pregnant and at risk for having a child with a genetic or chromosomal condition. People who have genetic conditions or want to know about their risk You may benefit from genetic counseling if you:  Have a condition that is genetic, chromosomal, or inherited.  Wish to know more about your risk for inherited conditions. These include: ? Heart disease. ? Cancer. ? Blood disorders. ? Mental illness.  Wish to know how lifestyle choices will affect a genetic risk that you have.  Want to  understand or respond to the results of genetic testing.  Want to know more about your risk for conditions that are common among your ethnic group. How is genetic counseling done? Genetic counseling is usually the first step a person takes as they are deciding to have genetic testing. Genetic counseling can help you understand the risks and benefits of genetic testing as well as what to expect if tests are done. Genetic counseling can also take place after genetic testing to help you understand the results. Before the session   Find out if counseling and testing are covered under your health insurance plan.  Make sure that you have all recommended screenings or tests.  Gather your personal medical records.  Bring information about your family's medical history.  Write down any questions you may have. During the session Plan to talk about:  Your family and personal medical history.  Possible patterns of inherited conditions in your family.  Choices that you have for genetic testing, or the results of genetic tests that you have already done.  Results of tests for any genetic conditions.  The meaning of a diagnosis and options for next steps.  Strategies for preventing, identifying, or managing genetic conditions.  Resources for further information, support, or care.  How you may share results with others in your family. After the session  You will receive a letter summarizing the information that you discussed with your genetic counselor.  You may have genetic testing and a follow-up visit to review your results.  Depending on the results of your genetic counseling, you may be referred to specialists or other resources for support and information.  Keep all follow-up visits as told by your genetic counselor. Use these follow-up visits to get more information or support. What are the benefits of genetic counseling? All information discussed during genetic counseling is kept  private and confidential. Genetic counseling can:  Explain the diagnosis of a condition.  Tell you about your risk of developing certain conditions, such as cancer.  Help you understand patterns of conditions that may be inherited in your family.  Help you find resources for coping with a diagnosis.  Help you plan for your next steps. This may include referrals to specialists. What are the risks of genetic counseling? There are no risks directly related to genetic counseling. However, if you choose to have genetic testing, you may experience:  Financial, social, or emotional impact of knowing the results of genetic testing.  Anxiety or guilt if results could have an impact on your pregnancy or children.  The impact on your life, including decisions to have a family.  Genetic testing for conditions that have no cure or treatment, such as Alzheimer's disease. Talk with your health care provider to understand the risks and benefits of genetic counseling  so that you can make a decision that is right for you and your family. Questions to ask your health care provider  How can I be referred for genetic counseling?  How do I prepare for genetic counseling?  What are the benefits of genetic counseling?  Will my insurance cover genetic counseling?  Are there any resources that can help me learn more? Where to find more information  [employer] of Genetic Counselors: ArtistMovie.se  [facility] Genetics Home Reference: https://www.fletcher.com/ Summary  Genetic counseling is a meeting with a health care professional who specializes in conditions that can be passed from parent to child (inherited). This specialist is called a Physiological scientist or Dietitian.  Genetic counseling is usually the first step a person takes as they are deciding to have genetic testing.  Information that is obtained from genetic counseling is private and confidential.  Genetic counseling can help you understand the  risks and benefits of genetic testing as well as what to expect if tests are done. This information is not intended to replace advice given to you by your health care provider. Make sure you discuss any questions you have with your health care provider. Document Revised: 06/05/2017 Document Reviewed: 06/05/2017 Elsevier Patient Education  2020 [employer].

## 2019-07-11 NOTE — Progress Notes (Signed)
GYNECOLOGY  VISIT   HPI: 59 y.o.   Married  Caucasian  female   G2P2002 with Patient's last menstrual period was 02/04/2018 (approximate).   here for   sonohysterogram due to endometrial polyp noted on endometrial biopsy.   Her prior pelvic US at an outside office on 04/15/19 showed a small anterior intramural fibroid.  She has been treated with cyclic Provera by another gynecologist due to proliferative change on a prior endometrial biopsy due as part of a work up for postmenopausal bleeding.  She had vaginal bleeding after going for one year with no cycle.  She has had withdrawal bleeding with the cyclic Provera.  Her last Provera dose was 06/16/19. She did have a menstruation on 06/17/19. She bleed for one week.   Her estradiol level was 31.1 and her [place] 47.3 on 06/11/19.  She has random lower abdominal pain of undetermined etiology.  A CT scan did show colitis, which was attributed to infection related to environmental exposure on their farm.  She sees Dr. Oneida Alar, her gastroenterologist, in [place].   FH of uterine cancer in her sister. She is asking for more information about genetic testing due to her family history.   GYNECOLOGIC HISTORY: Patient's last menstrual period was 02/04/2018 (approximate). Contraception:  NA Menopausal hormone therapy:  Provera discontinued.  Last mammogram:  10-30-18 3D/Neg/density /B/BiRads1--[facility] [place] Last pap smear:   06-11-19 Neg:Neg HR HPV,10-24-17 Neg:Neg HR HPV,08-19-15 Neg:Neg HR HPV        OB History    Gravida  2   Para  2   Term  2   Preterm      AB      Living  2     SAB      TAB      Ectopic      Multiple      Live Births  2              Patient Active Problem List   Diagnosis Date Noted  . Endometrial polyp   . Asthmatic bronchitis 01/30/2019  . Acute infectious diarrhea 11/27/2018  . Encounter for well woman exam with routine gynecological exam 11/14/2018  . Ganglion cyst of volar aspect of right  wrist   . Allergic rhinitis 06/12/2018  . Panic attacks 02/06/2018  . Elevated glucose 02/06/2018  . Petechial rash 01/05/2018  . Epistaxis 01/05/2018  . Screening for colorectal cancer 10/24/2017  . Encounter for gynecological examination with Papanicolaou smear of cervix 10/24/2017  . Cough 06/12/2017  . Upper respiratory tract infection 06/12/2017  . Anxiety 11/14/2016  . UTI (urinary tract infection) 11/11/2015  . Diarrhea 11/11/2015  . GERD (gastroesophageal reflux disease) 10/23/2015  . BP check 09/24/2015  . Urge incontinence 08/19/2015  . Stye external 07/15/2015  . S/P cervical spinal fusion 06/05/2015  . Acute sinusitis 05/20/2015  . Fatigue 05/13/2015  . Edema 04/01/2015  . Chest pain 04/01/2015  . Nonspecific abnormal electrocardiogram (ECG) (EKG) 04/01/2015  . 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 Neck pain, bilateral posterior 12/31/2014  . Lower back pain 12/31/2014  . Yeast infection 05/07/2014  . Obesity 05/07/2014  . Night sweats 04/14/2014  . Abdominal pain 11/25/2013  . Hormone replacement therapy (HRT) 08/21/2013  . Well adult exam 05/16/2013  . Hypertension     Past Medical History:  Diagnosis Date  . Abdominal pain 11/25/2013   Has some nausea associated with pain that has been on and off x 2 weeks will get [place]  . Anxiety  Takes Lorazepam  . Chronic bronchitis ([place])   . Endometrial polyp   . Gallstones   . GERD (gastroesophageal reflux disease)   . Hormone replacement therapy (HRT)   . Hypertension   . Night sweats 04/14/2014  . Obesity 05/07/2014  . STD (sexually transmitted disease)    HSV ?carrier--never had an outbreak but exposed from ex-husband  . Urge incontinence 08/19/2015  . Yeast infection 05/07/2014    Past Surgical History:  Procedure Laterality Date  . ANTERIOR CERVICAL DECOMP/DISCECTOMY FUSION N/A 06/05/2015   Procedure: C5-6, C6-7 Anterior Cervical Discectomy and Fusion, Allograft, Plate;  Surgeon: [name], MD;   Location: [facility];  Service: Orthopedics;  Laterality: N/A;  . ANTERIOR FUSION CERVICAL SPINE  06/05/2015   C 5 6 7    . CHOLECYSTECTOMY    . COLONOSCOPY    . FOOT SURGERY Left   . GANGLION CYST EXCISION Right 07/09/2018   Procedure: EXCISION, RIGHT VOLAR WRIST GANGLION;  Surgeon: [name], MD;  Location: [facility];  Service: Orthopedics;  Laterality: Right;  . MOLE REMOVAL    . WISDOM TOOTH EXTRACTION      Current Outpatient Medications  Medication Sig Dispense Refill  . albuterol (PROAIR HFA) 108 (90 Base) MCG/ACT inhaler Inhale 2 puffs into the lungs every 6 (six) hours as needed for wheezing or shortness of breath. 18 g 3  . Ascorbic Acid (VITAMIN C) 100 MG tablet Take 100 mg by mouth daily.    . Cholecalciferol (VITAMIN D3) 5000 units TABS Take 2 tablets by mouth daily. Takes 2 daily     . cyclobenzaprine (FLEXERIL) 10 MG tablet Take 1 tablet (10 mg total) by mouth 3 (three) times daily as needed for muscle spasms. 60 tablet 0  . diphenhydrAMINE (BENADRYL) 25 MG tablet Take 2 tablets (50 mg total) by mouth every 4 (four) hours as needed (bee sting). 30 tablet 3  . ibuprofen (ADVIL) 600 MG tablet Take twice a day x 1 week, then prn pain 60 tablet 1  . LORazepam (ATIVAN) 0.5 MG tablet TAKE 1 TABLET BY MOUTH AT BEDTIME AS NEEDED 30 tablet 1  . medroxyPROGESTERone (PROVERA) 10 MG tablet Take 1 tablet a day for 10 days of the month 30 tablet 3  . olmesartan (BENICAR) 40 MG tablet Take 1 tablet (40 mg total) by mouth daily. 90 tablet 1  . omeprazole (PRILOSEC) 40 MG capsule Take 1 capsule (40 mg total) by mouth daily. 90 capsule 3  . ondansetron (ZOFRAN ODT) 4 MG disintegrating tablet Take 1 tablet (4 mg total) by mouth every 8 (eight) hours as needed for nausea or vomiting. 20 tablet 1  . Specialty Vitamins Products (MENOPAUSE SUPPORT) TABS Take 1 tablet by mouth daily.     [name] triamterene-hydrochlorothiazide (MAXZIDE-25) 37.5-25 MG tablet TAKE 1 TABLET BY MOUTH DAILY 90  tablet 3   No current facility-administered medications for this visit.   Facility-Administered Medications Ordered in Other Visits  Medication Dose Route Frequency Provider Last Rate Last Admin  . aminophylline injection 75 mg  75 mg Intravenous BID PRN Dorothy Spark, MD   75 mg at 04/13/15 1037     ALLERGIES: Bee venom; Biaxin [clarithromycin]; Amlodipine; Betadine [povidone iodine]; Chloraprep one step [chlorhexidine gluconate]; Duraprep [antiseptic products, misc.]; and Keflex [cephalexin]  Family History  Problem Relation Age of Onset  . Breast cancer Mother 61       breast  . Diabetes Mother   . Fibromyalgia Mother   . Heart  disease Mother        CHF  . Hypertension Mother   . Kidney disease Mother   . Heart disease Father   . Emphysema Father   . Diabetes Brother   . Hypertension Brother        x 2  . Heart disease Maternal Grandmother   . Diabetes Maternal Grandmother   . Asthma Maternal Grandmother   . Arthritis Maternal Grandmother   . Heart disease Maternal Grandfather   . Arthritis Maternal Grandfather   . Cancer Maternal Grandfather        bladder  . Breast cancer Paternal Grandmother   . Heart disease Paternal Grandmother   . Arthritis Paternal Grandmother   . Stroke Paternal Grandmother   . Ovarian cancer Paternal Grandmother   . Diabetes Paternal Grandfather   . Heart disease Paternal Grandfather   . Colon cancer Paternal Grandfather        mets  . Arthritis Paternal Grandfather   . Stomach cancer Paternal Grandfather        mets to stomach  . Hypertension Brother   . Cancer Sister        ?endometrial ca  . Esophageal cancer Neg Hx   . Pancreatic cancer Neg Hx   . Rectal cancer Neg Hx     Social History   Socioeconomic History  . Marital status: Married    Spouse name: [name]  . Number of children: 4  . Years of education: college  . Highest education level: Not on file  Occupational History  . 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 Smoking status: Never Smoker  . Smokeless tobacco: Never Used  Substance and Sexual Activity  . Alcohol use: Yes    Alcohol/week: 1.0 standard drinks    Types: 1 Glasses of wine per week  . Drug use: No  . Sexual activity: Yes    Birth control/protection: None, Post-menopausal  Other Topics Concern  . 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 Difficulty of Paying Living Expenses: Not on file  Food Insecurity:   . Worried About Charity fundraiser in the Last Year: Not on file  . Ran Out of Food in the Last Year: Not on file  Transportation Needs:   . Lack of Transportation (Medical): Not on file  . Lack of Transportation (Non-Medical): Not on file  Physical Activity:   . Days of Exercise per Week: Not on file  . Minutes of Exercise per Session: Not on file  Stress:   . Feeling of Stress : Not on file  Social Connections:   . Frequency of Communication with Friends and Family: Not on file  . Frequency of Social Gatherings with Friends and Family: Not on file  . Attends Religious Services: Not on file  . Active Member of Clubs or Organizations: Not on file  . Attends Archivist Meetings: Not on file  . Marital Status: Not on file  Intimate Partner Violence:   . Fear of Current or Ex-Partner: Not on file  . Emotionally Abused: Not on file  . Physically Abused: Not on file  . Sexually Abused: Not on file    Review of Systems  PHYSICAL EXAMINATION:  BP 140/84 (Cuff Size: Large)   Pulse 70   Temp (!) 97.1 F (36.2 C) (Temporal)   Ht 5' 6.5" (1.689 m)   Wt 244 lb (110.7 kg)   LMP 02/04/2018 (Approximate)   BMI 38.79 kg/m     General appearance: alert, cooperative and appears stated age  Pelvic US Uterus no masses.  Retroverted  uterus.  Ovaries atrophic.  No free fluid.   Sonohysterogram Consent for procedure.  Sterile prep with Scrubcare.  Cannula placed inside uterine cavity.  Normal saline placed.  No filling defect noted.  Some difficulty expanding the uterine walls well.  Endometrium measuring 12 mm.  No complications.  No EBL.  Chaperone was present for exam.  ASSESSMENT  Relative thickening of endometrium. Perimenopausal female.  Status post recent discontinuation of Provera.  Abdominal pain of undetermined etiology.  IBS? Hx colitis.  FH of cancers.   PLAN  Ultrasound/sonohysterogram findings discussed with patient.  No current evidence of a polyp that needs surgical removal.  She will call if she does not have a menstruation by the end of this month.  If abnormal bleeding develops, she will need a hysteroscopy with dilation and curettage. We can check her hormones periodically to see if she has transitioned further toward menopause. Fu in 3 months.  I am placing a referral for her to see her gastroenterologist regarding her abdominal pain.  Information on genetic counseling and testing discussed and given to patient in written form.    An After Visit Summary was printed and given to the patient.  __25____ minutes face to face time of which over 50% was spent in counseling.

## 2019-07-11 NOTE — Progress Notes (Signed)
Encounter reviewed by Dr. Brook Amundson C. Silva.  

## 2019-07-16 ENCOUNTER — Other Ambulatory Visit: Payer: Self-pay | Admitting: Internal Medicine

## 2019-07-16 ENCOUNTER — Encounter: Payer: Self-pay | Admitting: Gastroenterology

## 2019-07-16 MED ORDER — LORAZEPAM 0.5 MG PO TABS: 0.5000 mg | ORAL_TABLET | Freq: Every evening | ORAL | 1 refills | Status: DC | PRN

## 2019-07-16 MED ORDER — PHENTERMINE HCL 37.5 MG PO TABS: 37.5000 mg | ORAL_TABLET | Freq: Every day | ORAL | 1 refills | Status: DC

## 2019-07-26 ENCOUNTER — Other Ambulatory Visit: Payer: Self-pay

## 2019-07-26 ENCOUNTER — Ambulatory Visit (INDEPENDENT_AMBULATORY_CARE_PROVIDER_SITE_OTHER): Payer: Federal, State, Local not specified - PPO | Admitting: Internal Medicine

## 2019-07-26 ENCOUNTER — Encounter: Payer: Self-pay | Admitting: Internal Medicine

## 2019-07-26 DIAGNOSIS — J0100 Acute maxillary sinusitis, unspecified: Secondary | ICD-10-CM | POA: Diagnosis not present

## 2019-07-26 MED ORDER — FLUCONAZOLE 150 MG PO TABS: 150.0000 mg | ORAL_TABLET | Freq: Once | ORAL | 1 refills | Status: AC

## 2019-07-26 MED ORDER — AZITHROMYCIN 250 MG PO TABS: ORAL_TABLET | ORAL | 0 refills | Status: DC

## 2019-07-26 NOTE — Progress Notes (Signed)
Virtual Visit via Telephone Note  I connected with [name] on 07/26/19 at 11:20 AM EST by telephone and verified that I am speaking with the correct person using two identifiers.   I discussed the limitations, risks, security and privacy concerns of performing an evaluation and management service by telephone and the availability of in person appointments. I also discussed with the patient that there may be a patient responsible charge related to this service. The patient expressed understanding and agreed to proceed.   History of Present Illness:   Our virtual connection has failed.  Caroline Wong is complaining of 2 days of sinus congestion, pain in the upper teeth, yellow discharge.  She has had this problem previously with sinus infections.  She tried over-the-counter medicines without relief Observations/Objective:  She sounds normal on the phone Assessment and Plan:  See assessment and plan Follow Up Instructions:    I discussed the assessment and treatment plan with the patient. The patient was provided an opportunity to ask questions and all were answered. The patient agreed with the plan and demonstrated an understanding of the instructions.   The patient was advised to call back or seek an in-person evaluation if the symptoms worsen or if the condition fails to improve as anticipated.  I provided 12 minutes of non-face-to-face time during this encounter.   Walker Kehr, MD

## 2019-07-26 NOTE — Assessment & Plan Note (Signed)
Empiric Z-Pak if not better.  Diflucan if needed Use nasal rinse, Afrin as needed

## 2019-07-30 ENCOUNTER — Other Ambulatory Visit: Payer: Self-pay | Admitting: Internal Medicine

## 2019-07-30 MED ORDER — PROMETHAZINE-CODEINE 6.25-10 MG/5ML PO SYRP: 5.0000 mL | ORAL_SOLUTION | ORAL | 0 refills | Status: DC | PRN

## 2019-08-01 ENCOUNTER — Ambulatory Visit: Payer: Federal, State, Local not specified - PPO | Admitting: Gastroenterology

## 2019-08-06 ENCOUNTER — Telehealth: Payer: Self-pay | Admitting: Obstetrics and Gynecology

## 2019-08-06 ENCOUNTER — Encounter: Payer: Self-pay | Admitting: Obstetrics and Gynecology

## 2019-08-06 DIAGNOSIS — N951 Menopausal and female climacteric states: Secondary | ICD-10-CM

## 2019-08-06 NOTE — Telephone Encounter (Signed)
[name]  Phone Number: [phone]  Good afternoon Dr Quincy Simmonds,  Per our conversation at my last visit I was to report to you if I had not had a monthly cycle by the end of February. As of this afternoon I still have not had my monthly cycle. The last date of my menstrual was January 11.   Caroline Wong

## 2019-08-06 NOTE — Telephone Encounter (Signed)
Attempted to call pt, voicemail box full. Will send pt Mychart message to return call to triage RN.

## 2019-08-06 NOTE — Telephone Encounter (Signed)
Patient is perimenopausal by her hormone testing and she has had a benign endometrial biopsy and sonohysterogram showing no filling defects.   I would recommend she consider doing a course of Provera 10 mg x 10 days every 2 months or taking a progesterone only birth control pill, Micronor (every day as there are no placebo pills) to give her some progesterone to balance out her natural estrogen.  She can do this therapy for 6 months and then we can reassess with an office visit what her response has been.  I know she has not been a fan of taking the Provera because of how it makes her feel.  Thank you for the update about her sister's uterine cancer.

## 2019-08-06 NOTE — Telephone Encounter (Signed)
Spoke to pt. Pt states having no cycle in Feb. Last cycle in Jan 2021. Pt was calling to report no cycle per Dr Elza Rafter recommendations per [facility] on 07/11/2019. Pt reports sister having uterine CA last oct/nov 2020 and was just told this information. Pt wanted to give that update from sister to Dr Quincy Simmonds.  Pt wanting to know what next steps will be. Will return call to pt after recommendations per Dr Quincy Simmonds. Pt agreeable.   Routing to Dr Quincy Simmonds.

## 2019-08-07 MED ORDER — MEDROXYPROGESTERONE ACETATE 10 MG PO TABS: ORAL_TABLET | ORAL | 0 refills | Status: DC

## 2019-08-07 NOTE — Telephone Encounter (Signed)
Spoke to pt. Pt given recommendations per Dr Quincy Simmonds. Pt agreeable to taking Provera as prescribed. Rx for Provera sent to pharmacy on file. # 30, 0RF. Pt has follow up appt on 01/09/2020 at 3pm. Pt verbalized understanding.   Routing to Dr Quincy Simmonds for review and will close encounter.

## 2019-08-09 ENCOUNTER — Other Ambulatory Visit: Payer: Self-pay | Admitting: Internal Medicine

## 2019-08-09 MED ORDER — BENZONATATE 200 MG PO CAPS: 200.0000 mg | ORAL_CAPSULE | Freq: Three times a day (TID) | ORAL | 1 refills | Status: DC | PRN

## 2019-08-22 ENCOUNTER — Other Ambulatory Visit: Payer: Self-pay

## 2019-08-22 ENCOUNTER — Encounter: Payer: Self-pay | Admitting: Gastroenterology

## 2019-08-22 ENCOUNTER — Ambulatory Visit: Payer: Federal, State, Local not specified - PPO | Admitting: Gastroenterology

## 2019-08-22 DIAGNOSIS — R1030 Lower abdominal pain, unspecified: Secondary | ICD-10-CM | POA: Diagnosis not present

## 2019-08-22 DIAGNOSIS — Z1211 Encounter for screening for malignant neoplasm of colon: Secondary | ICD-10-CM | POA: Diagnosis not present

## 2019-08-22 DIAGNOSIS — Z6835 Body mass index (BMI) 35.0-35.9, adult: Secondary | ICD-10-CM | POA: Diagnosis not present

## 2019-08-22 NOTE — Patient Instructions (Signed)
CONTINUE PROVERA.  CONTINUE OMEPRAZOLE.  TAKE 30 MINUTES PRIOR TO YOUR FIRST MEAL.  PLEASE CALL WITH QUESTIONS OR CONCERNS.  NEXT COLONOSCOPY IN 2025.   EAT TO LIVE AND THINK OF FOOD AS MEDICINE. 75% OF YOUR PLATE SHOULD BE FRUITS/VEGGIES.  To have more energy, and to lose weight:      1. CONTINUE YOUR WEIGHT LOSS EFFORTS. I RECOMMEND YOU READ AND FOLLOW RECOMMENDATIONS BY DR. MARK HYMAN, "10-DAY DETOX DIET".    2. If you must eat bread, EAT EZEKIEL BREAD. IT IS IN THE FROZEN SECTION OF THE GROCERY STORE.    3. DRINK WATER WITH FRUIT OR CUCUMBER ADDED. YOUR URINE SHOULD BE LIGHT YELLOW. AVOID SODA, GATORADE, ENERGY DRINKS, OR DIET SODA.     4. AVOID HIGH FRUCTOSE CORN SYRUP AND CAFFEINE.     5. DO NOT chew SUGAR FREE GUM OR USE ARTIFICIAL SWEETENERS. IF NEEDED USE STEVIA AS A SWEETENER.    6. DO NOT EAT ENRICHED WHEAT FLOUR, PASTA, RICE, OR CEREAL.    7. ONLY EAT WILD CAUGHT SEAFOOD, GRASS FED BEEF OR CHICKEN, PORK FROM PASTURE RAISE PIGS, OR EGGS FROM PASTURE RAISED CHICKENS.    8. PRACTICE CHAIR YOGA FOR 15-30 MINS 3 OR 4 TIMES A WEEK AND PROGRESS TO HATHA YOGA OVER NEXT 6 MOS.    9. START TAKING A MULTIVITAMIN AND  VITAMIN B12 DAILY.   FOR 3 MOS, ADDITIONAL SUPPLEMENTS TO DECREASE CRAVING AND SUPPRESS YOUR APPETITE:    1. CINNAMON 500 MG EVERY AM PRIOR TO FIRST MEAL.   **STABILIZES BLOOD GLUCOSE/REDUCES CRAVINGS**    2. CHROMIUM 400-500 MG WITH MEALS TWICE DAILY.    **FAT BURNER**    3. GREEN TEA EXTRACT ONE DAILY.   **FAT BURNER/SUPPRESSES YOUR APPETITE**    4. ALPHA LIPOIC ACID TWICE DAILY.   **NATURAL ANTI-INFLAMMATORY SUPPLEMENT THAT IS AN ALTERNATIVE TO IBUPROFEN OR NAPROXEN**

## 2019-08-22 NOTE — Progress Notes (Signed)
Subjective:    Patient ID: Caroline Wong, female    DOB: 1961-02-13, 59 y.o.   MRN: [id number] Plotnikov, [name], MD  HPI Last cycle OCT 2019. JAN 2020 HAVING PAINS IN LOWER ABDOMEN SINCE. CAN BE SEVERE AND SOMETIMES MILD. SICK IN JUN 2020 AND STILL HAD PAIN. HAD CYCLE OCT 2020 HAD A CYCLE AND HEAVY BLEEDING FOR 4 WEEKS. SAW GYN. PUT ON PROGESTERONE. SWITCHED TO ANOTHER GYN DR. Quincy Wong FOR 2ND OPINION. NOW TAKING PROGESTERONE QOMO FOR 6 MOS. PAINS ARE BETTER BUT WANTED TO BE SEEN BY GI. HEARTBURN CONTROLLED. UPPER ABDOMINAL PAIN IN JUN SICK FROM CHICKENS/COLITIS AND THAT IS RESOLVED.  BILATERAL LOWER ABDOMINAL PAIN-HAPPENED JAN 2020 IT WAS ALMOST EVERY WEEK AND NOW INFREQUENT. LAST SEVER EPISODE BEFORE CHRISTMAS. ALWAYS LOWER.   PT DENIES FEVER, CHILLS, HEMATOCHEZIA, HEMATEMESIS, nausea, vomiting, melena, diarrhea, CHEST PAIN, SHORTNESS OF BREATH,  CHANGE IN BOWEL IN HABITS, constipation, HEMATURIA, DYSURIA, UPPER abdominal pain, problems swallowing, problems with sedation, OR heartburn or indigestion.  Past Medical History:  Diagnosis Date  . Abdominal pain 11/25/2013   Has some nausea associated with pain that has been on and off x 2 weeks will get [place]  . Anxiety    Takes Lorazepam  . Chronic bronchitis ([place])   . Endometrial polyp   . Gallstones   . GERD (gastroesophageal reflux disease)   . Hormone replacement therapy (HRT)   . Hypertension   . Night sweats 04/14/2014  . Obesity 05/07/2014  . STD (sexually transmitted disease)    HSV ?carrier--never had an outbreak but exposed from ex-husband  . Urge incontinence 08/19/2015  . Yeast infection 05/07/2014    Past Surgical History:  Procedure Laterality Date  . ANTERIOR CERVICAL DECOMP/DISCECTOMY FUSION N/A 06/05/2015   Procedure: C5-6, C6-7 Anterior Cervical Discectomy and Fusion, Allograft, Plate;  Surgeon: Caroline Killings, MD;  Location: [facility];  Service: Orthopedics;  Laterality: N/A;  . ANTERIOR FUSION CERVICAL SPINE  06/05/2015     C 5 6 7    . CHOLECYSTECTOMY    . COLONOSCOPY    . FOOT SURGERY Left   . GANGLION CYST EXCISION Right 07/09/2018   Procedure: EXCISION, RIGHT VOLAR WRIST GANGLION;  Surgeon: Caroline Killings, MD;  Location: [facility];  Service: Orthopedics;  Laterality: Right;  . MOLE REMOVAL    . WISDOM TOOTH EXTRACTION     Allergies  Allergen Reactions  . Bee Venom Swelling  . Biaxin [Clarithromycin] Swelling    Edema in joint Can take a Zpac ok  . Amlodipine     Edema, redness  . Betadine [Povidone Iodine] Rash  . Chloraprep One Step [Chlorhexidine Gluconate] Rash  . Duraprep [employer], Misc.] Rash  . Keflex [Cephalexin] Rash    Current Outpatient Medications  Medication Sig    . Ascorbic Acid (VITAMIN C) 100 MG tablet Take 100 mg by mouth daily.    . benzonatate (TESSALON) 200 MG capsule Take 1 capsule (200 mg total) by mouth 3 (three) times daily as needed for cough.    . Cholecalciferol (VITAMIN D3) 5000 units TABS Take 2 tablets by mouth daily. Takes 2 daily     . cyclobenzaprine (FLEXERIL) 10 MG tablet Take 1 tablet (10 mg total) by mouth 3 (three) times daily as needed for muscle spasms. (Patient taking differently: Take 10 mg by mouth as needed for muscle spasms. )    . diphenhydrAMINE (BENADRYL) 25 MG tablet Take 2 tablets (50 mg total) by mouth every 4 (four)  hours as needed (bee sting). (Patient taking differently: Take 50 mg by mouth as needed (bee sting). )    . ibuprofen (ADVIL) 600 MG tablet Take twice a day x 1 week, then prn pain    . LORazepam (ATIVAN) 0.5 MG tablet Take 1 tablet (0.5 mg total) by mouth at bedtime as needed.    . medroxyPROGESTERone (PROVERA) 10 MG tablet Take 1 (10 mg)  tablet every day for 10 days every 2 months.    [name] olmesartan (BENICAR) 40 MG tablet Take 1 tablet (40 mg total) by mouth daily.    [name] omeprazole (PRILOSEC) 40 MG capsule Take 1 capsule (40 mg total) by mouth daily.    . ondansetron (ZOFRAN ODT) 4 MG disintegrating tablet  Take 1 tablet (4 mg total) by mouth every 8 (eight) hours as needed for nausea or vomiting. (Patient taking differently: Take 4 mg by mouth as needed for nausea or vomiting. )    . phentermine (ADIPEX-P) 37.5 MG tablet Take 1 tablet (37.5 mg total) by mouth daily before breakfast.    . Specialty Vitamins Products (MENOPAUSE SUPPORT) TABS Take 1 tablet by mouth daily.     [name] triamterene-hydrochlorothiazide (MAXZIDE-25) 37.5-25 MG tablet TAKE 1 TABLET BY MOUTH DAILY    .      .      .        Review of Systems PER HPI OTHERWISE ALL SYSTEMS ARE NEGATIVE.  Family History  Problem Relation Age of Onset  . Breast cancer Mother 77       breast  . Diabetes Mother   . Fibromyalgia Mother   . Heart disease Mother        CHF  . Hypertension Mother   . Kidney disease Mother   . Heart disease Father   . Emphysema Father   . Diabetes Brother   . Hypertension Brother        x 2  . Heart disease Maternal Grandmother   . Diabetes Maternal Grandmother   . Asthma Maternal Grandmother   . Arthritis Maternal Grandmother   . Heart disease Maternal Grandfather   . Arthritis Maternal Grandfather   . Cancer Maternal Grandfather        bladder  . Breast cancer Paternal Grandmother   . Heart disease Paternal Grandmother   . Arthritis Paternal Grandmother   . Stroke Paternal Grandmother   . Ovarian cancer Paternal Grandmother   . Diabetes Paternal Grandfather   . Heart disease Paternal Grandfather   . Colon cancer Paternal Grandfather        mets  . Arthritis Paternal Grandfather   . Stomach cancer Paternal Grandfather        mets to stomach  . Hypertension Brother   . Cancer Sister        ?endometrial ca  . Esophageal cancer Neg Hx   . Pancreatic cancer Neg Hx   . Rectal cancer Neg Hx     Social History   Tobacco Use  . Smoking status: Never Smoker  . Smokeless tobacco: Never Used  Substance Use Topics  . Alcohol use: Yes    Alcohol/week: 1.0 standard drinks    Types: 1 Glasses of  wine per week  . Drug use: No       Objective:   Physical Exam Constitutional:      General: She is not in acute distress.    Appearance: Normal appearance.  HENT:     Mouth/Throat:     Comments: MASK  IN PLACE Eyes:     General: No scleral icterus.    Pupils: Pupils are equal, round, and reactive to light.  Cardiovascular:     Rate and Rhythm: Normal rate and regular rhythm.     Pulses: Normal pulses.     Heart sounds: Normal heart sounds.  Pulmonary:     Effort: Pulmonary effort is normal.     Breath sounds: Normal breath sounds.  Abdominal:     General: Bowel sounds are normal.     Palpations: Abdomen is soft.     Tenderness: There is no abdominal tenderness.  Musculoskeletal:     Cervical back: Normal range of motion.     Right lower leg: No edema.     Left lower leg: No edema.  Lymphadenopathy:     Cervical: No cervical adenopathy.  Skin:    General: Skin is warm and dry.  Neurological:     Mental Status: She is alert and oriented to person, place, and time.     Comments: NO  NEW FOCAL DEFICITS  Psychiatric:        Mood and Affect: Mood normal.     Comments: NORMAL AFFECT       Assessment & Plan:

## 2019-08-22 NOTE — Assessment & Plan Note (Addendum)
LIKELY DUE TO GYN SYSTEM. PAIN TODAY IN OFFICE LASTED SECONDS AND ASSOCIATED WITH MENSTRUAL FLOW.  CONTINUE PROVERA. CONTINUE OMEPRAZOLE.  TAKE 30 MINUTES PRIOR TO YOUR FIRST MEAL. PLEASE CALL WITH QUESTIONS OR CONCERNS.

## 2019-08-22 NOTE — Assessment & Plan Note (Signed)
DESIRES TO LOSE WEIGHT.  EAT TO LIVE AND THINK OF FOOD AS MEDICINE. 75% OF YOUR PLATE SHOULD BE FRUITS/VEGGIES.  To have more energy, and to lose weight:      1. CONTINUE YOUR WEIGHT LOSS EFFORTS. I RECOMMEND YOU READ AND FOLLOW RECOMMENDATIONS BY DR. MARK HYMAN, "10-DAY DETOX DIET".   2. If you must eat bread, EAT EZEKIEL BREAD. IT IS IN THE FROZEN SECTION OF THE GROCERY STORE.   3. DRINK WATER WITH FRUIT OR CUCUMBER ADDED. YOUR URINE SHOULD BE LIGHT YELLOW. AVOID SODA, GATORADE, ENERGY DRINKS, OR DIET SODA.    4. AVOID HIGH FRUCTOSE CORN SYRUP AND CAFFEINE.    5. DO NOT chew SUGAR FREE GUM OR USE ARTIFICIAL SWEETENERS. IF NEEDED USE STEVIA AS A SWEETENER.   6. DO NOT EAT ENRICHED WHEAT FLOUR, PASTA, RICE, OR CEREAL.   7. ONLY EAT WILD CAUGHT SEAFOOD, GRASS FED BEEF OR CHICKEN, PORK FROM PASTURE RAISE PIGS, OR EGGS FROM PASTURE RAISED CHICKENS.   8. PRACTICE CHAIR YOGA FOR 15-30 MINS 3 OR 4 TIMES A WEEK AND PROGRESS TO HATHA YOGA OVER NEXT 6 MOS.   9. START TAKING A MULTIVITAMIN AND  VITAMIN B12 DAILY.   FOR 3 MOS, ADDITIONAL SUPPLEMENTS TO DECREASE CRAVING AND SUPPRESS YOUR APPETITE:   1. CINNAMON 500 MG EVERY AM PRIOR TO FIRST MEAL.   **STABILIZES BLOOD GLUCOSE/REDUCES CRAVINGS**   2. CHROMIUM 400-500 MG WITH MEALS TWICE DAILY.    **FAT BURNER**   3. GREEN TEA EXTRACT ONE DAILY.   **FAT BURNER/SUPPRESSES YOUR APPETITE**   4. ALPHA LIPOIC ACID TWICE DAILY.   **NATURAL ANTI-INFLAMMATORY SUPPLEMENT THAT IS AN ALTERNATIVE TO IBUPROFEN OR NAPROXEN**

## 2019-08-22 NOTE — Assessment & Plan Note (Signed)
AVERAGE RISK. LAST TCS 2015.  NEXT COLONOSCOPY IN 2025.

## 2019-08-26 ENCOUNTER — Ambulatory Visit: Payer: Federal, State, Local not specified - PPO | Admitting: Internal Medicine

## 2019-09-10 ENCOUNTER — Ambulatory Visit: Payer: Federal, State, Local not specified - PPO | Admitting: Internal Medicine

## 2019-09-10 ENCOUNTER — Other Ambulatory Visit: Payer: Self-pay

## 2019-09-10 ENCOUNTER — Encounter: Payer: Self-pay | Admitting: Internal Medicine

## 2019-09-10 VITALS — BP 130/80 | HR 79 | Temp 98.3°F | Ht 69.0 in | Wt 232.0 lb

## 2019-09-10 DIAGNOSIS — F419 Anxiety disorder, unspecified: Secondary | ICD-10-CM

## 2019-09-10 DIAGNOSIS — J4521 Mild intermittent asthma with (acute) exacerbation: Secondary | ICD-10-CM

## 2019-09-10 DIAGNOSIS — R739 Hyperglycemia, unspecified: Secondary | ICD-10-CM | POA: Diagnosis not present

## 2019-09-10 DIAGNOSIS — R1030 Lower abdominal pain, unspecified: Secondary | ICD-10-CM

## 2019-09-10 DIAGNOSIS — I10 Essential (primary) hypertension: Secondary | ICD-10-CM | POA: Diagnosis not present

## 2019-09-10 DIAGNOSIS — M5440 Lumbago with sciatica, unspecified side: Secondary | ICD-10-CM | POA: Diagnosis not present

## 2019-09-10 DIAGNOSIS — G8929 Other chronic pain: Secondary | ICD-10-CM

## 2019-09-10 DIAGNOSIS — R7309 Other abnormal glucose: Secondary | ICD-10-CM

## 2019-09-10 LAB — URINALYSIS
Bilirubin Urine: NEGATIVE
Hgb urine dipstick: NEGATIVE
Ketones, ur: NEGATIVE
Leukocytes,Ua: NEGATIVE
Nitrite: NEGATIVE
Specific Gravity, Urine: 1.02 (ref 1.000–1.030)
Total Protein, Urine: NEGATIVE
Urine Glucose: NEGATIVE
Urobilinogen, UA: 0.2 (ref 0.0–1.0)
pH: 6 (ref 5.0–8.0)

## 2019-09-10 LAB — BASIC METABOLIC PANEL WITH GFR
BUN: 23 mg/dL (ref 6–23)
CO2: 29 meq/L (ref 19–32)
Calcium: 9.5 mg/dL (ref 8.4–10.5)
Chloride: 102 meq/L (ref 96–112)
Creatinine, Ser: 1.05 mg/dL (ref 0.40–1.20)
GFR: 54.13 mL/min — ABNORMAL LOW (ref >60.00)
Glucose, Bld: 99 mg/dL (ref 70–99)
Potassium: 3.9 meq/L (ref 3.5–5.1)
Sodium: 138 meq/L (ref 135–145)

## 2019-09-10 LAB — SEDIMENTATION RATE: Sed Rate: 54 mm/hr — ABNORMAL HIGH (ref 0–30)

## 2019-09-10 LAB — CBC WITH DIFFERENTIAL/PLATELET
Basophils Absolute: 0.1 10*3/uL (ref 0.0–0.1)
Basophils Relative: 1 % (ref 0.0–3.0)
Eosinophils Absolute: 0.2 10*3/uL (ref 0.0–0.7)
Eosinophils Relative: 2.5 % (ref 0.0–5.0)
HCT: 42.2 % (ref 36.0–46.0)
Hemoglobin: 14.1 g/dL (ref 12.0–15.0)
Lymphocytes Relative: 27 % (ref 12.0–46.0)
Lymphs Abs: 2.1 10*3/uL (ref 0.7–4.0)
MCHC: 33.5 g/dL (ref 30.0–36.0)
MCV: 92.1 fl (ref 78.0–100.0)
Monocytes Absolute: 0.8 10*3/uL (ref 0.1–1.0)
Monocytes Relative: 9.6 % (ref 3.0–12.0)
Neutro Abs: 4.7 10*3/uL (ref 1.4–7.7)
Neutrophils Relative %: 59.9 % (ref 43.0–77.0)
Platelets: 267 10*3/uL (ref 150.0–400.0)
RBC: 4.58 Mil/uL (ref 3.87–5.11)
RDW: 12.8 % (ref 11.5–15.5)
WBC: 7.9 10*3/uL (ref 4.0–10.5)

## 2019-09-10 LAB — HEPATIC FUNCTION PANEL
ALT: 33 U/L (ref 0–35)
AST: 23 U/L (ref 0–37)
Albumin: 4.3 g/dL (ref 3.5–5.2)
Alkaline Phosphatase: 91 U/L (ref 39–117)
Bilirubin, Direct: 0.1 mg/dL (ref 0.0–0.3)
Total Bilirubin: 0.4 mg/dL (ref 0.2–1.2)
Total Protein: 7.6 g/dL (ref 6.0–8.3)

## 2019-09-10 LAB — TSH: TSH: 0.95 u[IU]/mL (ref 0.35–4.50)

## 2019-09-10 LAB — HEMOGLOBIN A1C: Hgb A1c MFr Bld: 6.7 % — ABNORMAL HIGH (ref 4.6–6.5)

## 2019-09-10 NOTE — Progress Notes (Signed)
Subjective:  Patient ID: Caroline Wong, female    DOB: [birth year]  Age: 59 y.o. MRN: [id number]  CC: No chief complaint on file.   HPI Caroline Wong presents for LBP, anxiety, HTN f/u  Outpatient Medications Prior to Visit  Medication Sig Dispense Refill  . albuterol (PROAIR HFA) 108 (90 Base) MCG/ACT inhaler Inhale 2 puffs into the lungs every 6 (six) hours as needed for wheezing or shortness of breath. 18 g 3  . Ascorbic Acid (VITAMIN C) 100 MG tablet Take 100 mg by mouth daily.    [name] azithromycin (ZITHROMAX Z-PAK) 250 MG tablet As directed 6 tablet 0  . benzonatate (TESSALON) 200 MG capsule Take 1 capsule (200 mg total) by mouth 3 (three) times daily as needed for cough. 30 capsule 1  . Cholecalciferol (VITAMIN D3) 5000 units TABS Take 2 tablets by mouth daily. Takes 2 daily     . cyclobenzaprine (FLEXERIL) 10 MG tablet Take 1 tablet (10 mg total) by mouth 3 (three) times daily as needed for muscle spasms. (Patient taking differently: Take 10 mg by mouth as needed for muscle spasms. ) 60 tablet 0  . diphenhydrAMINE (BENADRYL) 25 MG tablet Take 2 tablets (50 mg total) by mouth every 4 (four) hours as needed (bee sting). (Patient taking differently: Take 50 mg by mouth as needed (bee sting). ) 30 tablet 3  . ibuprofen (ADVIL) 600 MG tablet Take twice a day x 1 week, then prn pain 60 tablet 1  . LORazepam (ATIVAN) 0.5 MG tablet Take 1 tablet (0.5 mg total) by mouth at bedtime as needed. 30 tablet 1  . medroxyPROGESTERone (PROVERA) 10 MG tablet Take 1 (10 mg)  tablet every day for 10 days every 2 months. 30 tablet 0  . olmesartan (BENICAR) 40 MG tablet Take 1 tablet (40 mg total) by mouth daily. 90 tablet 1  . omeprazole (PRILOSEC) 40 MG capsule Take 1 capsule (40 mg total) by mouth daily. 90 capsule 3  . ondansetron (ZOFRAN ODT) 4 MG disintegrating tablet Take 1 tablet (4 mg total) by mouth every 8 (eight) hours as needed for nausea or vomiting. (Patient taking differently: Take 4 mg by  mouth as needed for nausea or vomiting. ) 20 tablet 1  . promethazine-codeine (PHENERGAN WITH CODEINE) 6.25-10 MG/5ML syrup Take 5 mLs by mouth every 4 (four) hours as needed. 300 mL 0  . Specialty Vitamins Products (MENOPAUSE SUPPORT) TABS Take 1 tablet by mouth daily.     [name] triamterene-hydrochlorothiazide (MAXZIDE-25) 37.5-25 MG tablet TAKE 1 TABLET BY MOUTH DAILY 90 tablet 3  . phentermine (ADIPEX-P) 37.5 MG tablet Take 1 tablet (37.5 mg total) by mouth daily before breakfast. 30 tablet 1   Facility-Administered Medications Prior to Visit  Medication Dose Route Frequency Provider Last Rate Last Admin  . aminophylline injection 75 mg  75 mg Intravenous BID PRN Dorothy Spark, MD   75 mg at 04/13/15 1037    ROS: Review of Systems  Constitutional: Positive for fatigue. Negative for activity change, appetite change, chills and unexpected weight change.  HENT: Negative for congestion, mouth sores and sinus pressure.   Eyes: Negative for visual disturbance.  Respiratory: Negative for cough and chest tightness.   Gastrointestinal: Negative for abdominal pain and nausea.  Genitourinary: Negative for difficulty urinating, frequency and vaginal pain.  Musculoskeletal: Positive for back pain. Negative for gait problem.  Skin: Negative for pallor and rash.  Neurological: Negative for dizziness, tremors, weakness, numbness and headaches.  Psychiatric/Behavioral:  Negative for confusion, sleep disturbance and suicidal ideas.    Objective:  BP 130/80 (BP Location: Left Arm, Patient Position: Sitting, Cuff Size: Large)   Pulse 79   Temp 98.3 F (36.8 C) (Oral)   Ht 5\' 9"  (1.753 m)   Wt 232 lb (105.2 kg)   LMP 08/20/2019   SpO2 98%   BMI 34.26 kg/m   BP Readings from Last 3 Encounters:  09/10/19 130/80  08/22/19 130/83  07/11/19 140/84    Wt Readings from Last 3 Encounters:  09/10/19 232 lb (105.2 kg)  08/22/19 238 lb 9.6 oz (108.2 kg)  07/11/19 244 lb (110.7 kg)    Physical  Exam Constitutional:      General: She is not in acute distress.    Appearance: She is well-developed.  HENT:     Head: Normocephalic.     Right Ear: External ear normal.     Left Ear: External ear normal.     Nose: Nose normal.  Eyes:     General:        Right eye: No discharge.        Left eye: No discharge.     Conjunctiva/sclera: Conjunctivae normal.     Pupils: Pupils are equal, round, and reactive to light.  Neck:     Thyroid: No thyromegaly.     Vascular: No JVD.     Trachea: No tracheal deviation.  Cardiovascular:     Rate and Rhythm: Normal rate and regular rhythm.     Heart sounds: Normal heart sounds.  Pulmonary:     Effort: No respiratory distress.     Breath sounds: No stridor. No wheezing.  Abdominal:     General: Bowel sounds are normal. There is no distension.     Palpations: Abdomen is soft. There is no mass.     Tenderness: There is no abdominal tenderness. There is no guarding or rebound.  Musculoskeletal:        General: Tenderness present.     Cervical back: Normal range of motion and neck supple.  Lymphadenopathy:     Cervical: No cervical adenopathy.  Skin:    Findings: No erythema or rash.  Neurological:     Cranial Nerves: No cranial nerve deficit.     Motor: No abnormal muscle tone.     Coordination: Coordination normal.     Deep Tendon Reflexes: Reflexes normal.  Psychiatric:        Behavior: Behavior normal.        Thought Content: Thought content normal.        Judgment: Judgment normal.     Lab Results  Component Value Date   WBC 7.6 03/20/2019   HGB 14.4 03/20/2019   HCT 42.6 03/20/2019   PLT 269.0 03/20/2019   GLUCOSE 119 (H) 03/20/2019   CHOL 165 03/20/2019   TRIG 148.0 03/20/2019   HDL 47.90 03/20/2019   LDLCALC 88 03/20/2019   ALT 25 03/20/2019   AST 19 03/20/2019   NA 137 03/20/2019   K 3.8 03/20/2019   CL 102 03/20/2019   CREATININE 0.94 03/20/2019   BUN 20 03/20/2019   CO2 25 03/20/2019   TSH 0.78 03/20/2019    INR 0.9 02/06/2018   HGBA1C 6.7 (H) 09/04/2018    No results found.  Assessment & Plan:    Walker Kehr, MD

## 2019-09-10 NOTE — Addendum Note (Signed)
Addended by: Trenda Moots on: [id number] 01:48 PM   Modules accepted: Orders

## 2019-09-10 NOTE — Assessment & Plan Note (Signed)
Stretch Tramadol prm

## 2019-09-10 NOTE — Assessment & Plan Note (Signed)
A1c Cont w/wt loss

## 2019-09-10 NOTE — Assessment & Plan Note (Addendum)
Lorazepam prn  Potential benefits of a long term benzodiazepines  use as well as potential risks  and complications were explained to the patient and were aknowledged.  

## 2019-09-10 NOTE — Assessment & Plan Note (Signed)
Treat GERD

## 2019-09-10 NOTE — Assessment & Plan Note (Signed)
F/u w/Dr Oneida Alar

## 2019-09-10 NOTE — Assessment & Plan Note (Signed)
Olmesartan, Toprol XL 1/2 tab qd 12.5 mg, Maxzide

## 2019-09-12 ENCOUNTER — Other Ambulatory Visit: Payer: Self-pay | Admitting: Internal Medicine

## 2019-09-15 ENCOUNTER — Other Ambulatory Visit: Payer: Self-pay | Admitting: Internal Medicine

## 2019-09-15 MED ORDER — LORAZEPAM 0.5 MG PO TABS: 0.5000 mg | ORAL_TABLET | Freq: Every evening | ORAL | 1 refills | Status: DC | PRN

## 2019-10-08 ENCOUNTER — Encounter: Payer: Self-pay | Admitting: Obstetrics and Gynecology

## 2019-10-08 ENCOUNTER — Encounter: Payer: Self-pay | Admitting: Internal Medicine

## 2019-10-08 ENCOUNTER — Other Ambulatory Visit: Payer: Self-pay

## 2019-10-08 ENCOUNTER — Ambulatory Visit: Payer: Federal, State, Local not specified - PPO | Admitting: Internal Medicine

## 2019-10-08 ENCOUNTER — Ambulatory Visit: Payer: Federal, State, Local not specified - PPO | Admitting: Obstetrics and Gynecology

## 2019-10-08 VITALS — BP 118/80 | HR 72 | Temp 97.5°F | Ht 69.0 in | Wt 233.0 lb

## 2019-10-08 DIAGNOSIS — N951 Menopausal and female climacteric states: Secondary | ICD-10-CM

## 2019-10-08 DIAGNOSIS — R1031 Right lower quadrant pain: Secondary | ICD-10-CM | POA: Diagnosis not present

## 2019-10-08 DIAGNOSIS — R1030 Lower abdominal pain, unspecified: Secondary | ICD-10-CM

## 2019-10-08 DIAGNOSIS — Z8742 Personal history of other diseases of the female genital tract: Secondary | ICD-10-CM

## 2019-10-08 LAB — BASIC METABOLIC PANEL
BUN: 25 mg/dL — ABNORMAL HIGH (ref 6–23)
CO2: 29 mEq/L (ref 19–32)
Calcium: 9.3 mg/dL (ref 8.4–10.5)
Chloride: 102 mEq/L (ref 96–112)
Creatinine, Ser: 1.14 mg/dL (ref 0.40–1.20)
GFR: 49.22 mL/min — ABNORMAL LOW (ref >60.00)
Glucose, Bld: 93 mg/dL (ref 70–99)
Potassium: 4 mEq/L (ref 3.5–5.1)
Sodium: 137 mEq/L (ref 135–145)

## 2019-10-08 MED ORDER — MEDROXYPROGESTERONE ACETATE 10 MG PO TABS: ORAL_TABLET | ORAL | 0 refills | Status: DC

## 2019-10-08 NOTE — Progress Notes (Signed)
GYNECOLOGY  VISIT   HPI: 59 y.o.   Married  Caucasian  female   G2P2002 with Patient's last menstrual period was 08/20/2019.   here for 3 month recheck    Her prior pelvic US at an outside office on 04/15/19 showed a small anterior intramural fibroid.  She was been treated with cyclic Provera by another gynecologist due to proliferative change on a prior endometrial biopsy due as part of a work up for postmenopausal bleeding.  She had vaginal bleeding after going for one year with no cycle.  Her estradiol level was 31.1 and her [place] 47.3 on 06/11/19.  Follow up endometrial biopsy in this office on 06/26/19 showed a benign endometrial polyp. Pelvic ultrasound on 07/11/19 was normal and sonohysterogram on 07/11/19 showed no filling defect.   I prescribed Provera 10 mg x 10 days to take every other month as she was not very tolerant of side effects of Provera. Her bleeding is not as heavy and as dark in color as it had been, and it lasts for only 5 days.  She is not bleeding outside of taking the Provera.  Still has some right sided lower abdomen pain with her bleeding and without her bleeding.    Tolerating the Provera ok.  She is satisfied with this treatment plan.   She has random lower abdominal pain of undetermined etiology.  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  She saw Dr. Oneida Alar, her gastroenterologist, in [place], who thought the pain was not GI in origin. She will see her PCP Dr. Alain Marion this afternoon about this.   FH of uterine cancer in her sister.  GYNECOLOGIC HISTORY: Patient's last menstrual period was 08/20/2019. Contraception:  Postmenopausal Menopausal hormone therapy:  none Last mammogram:  10-30-18 3D/Neg/density /B/BiRads1--[facility] Last pap smear:   06-11-19 Neg:Neg HR HPV,10-24-17 Neg:Neg HR HPV,08-19-15 Neg:Neg HR HPV        OB History    Gravida  2   Para  2    Term  2   Preterm      AB      Living  2     SAB      TAB      Ectopic      Multiple      Live Births  2              Patient Active Problem List   Diagnosis Date Noted  . Hyperglycemia 09/10/2019  . Body mass index (BMI) 35.0-35.9, adult 08/22/2019  . Endometrial polyp   . Asthmatic bronchitis 01/30/2019  . Acute infectious diarrhea 11/27/2018  . Colon cancer screening 11/14/2018  . Ganglion cyst of volar aspect of right wrist   . Allergic rhinitis 06/12/2018  . Panic attacks 02/06/2018  . Elevated glucose 02/06/2018  . Petechial rash 01/05/2018  . Epistaxis 01/05/2018  . Screening for colorectal cancer 10/24/2017  . Encounter for gynecological examination with Papanicolaou smear of cervix 10/24/2017  . Cough 06/12/2017  . Upper respiratory tract infection 06/12/2017  . Anxiety 11/14/2016  . UTI (urinary tract infection) 11/11/2015  . Diarrhea 11/11/2015  . GERD (gastroesophageal reflux disease) 10/23/2015  . BP check 09/24/2015  . Urge incontinence 08/19/2015  . Stye external 07/15/2015  . S/P cervical spinal fusion 06/05/2015  . Acute sinusitis 05/20/2015  . Fatigue 05/13/2015  . Edema 04/01/2015  . Chest pain 04/01/2015  . Nonspecific abnormal electrocardiogram (ECG) (EKG)  04/01/2015  . 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 Neck pain, bilateral posterior 12/31/2014  . Lower back pain 12/31/2014  . Yeast infection 05/07/2014  . Obesity 05/07/2014  . Night sweats 04/14/2014  . Abdominal pain 11/25/2013  . Hormone replacement therapy (HRT) 08/21/2013  . Well adult exam 05/16/2013  . Hypertension     Past Medical History:  Diagnosis Date  . Abdominal pain 11/25/2013   Has some nausea associated with pain that has been on and off x 2 weeks will get [place]  . Anxiety    Takes Lorazepam  . Chronic bronchitis ([place])   . Endometrial polyp   . Gallstones   . GERD (gastroesophageal reflux disease)   . Hormone replacement therapy (HRT)   .  Hypertension   . Night sweats 04/14/2014  . Obesity 05/07/2014  . STD (sexually transmitted disease)    HSV ?carrier--never had an outbreak but exposed from ex-husband  . Urge incontinence 08/19/2015  . Yeast infection 05/07/2014    Past Surgical History:  Procedure Laterality Date  . ANTERIOR CERVICAL DECOMP/DISCECTOMY FUSION N/A 06/05/2015   Procedure: C5-6, C6-7 Anterior Cervical Discectomy and Fusion, Allograft, Plate;  Surgeon: [name], MD;  Location: [facility];  Service: Orthopedics;  Laterality: N/A;  . ANTERIOR FUSION CERVICAL SPINE  06/05/2015   C 5 6 7    . CHOLECYSTECTOMY    . COLONOSCOPY  2015   NO POLYPS  . FOOT SURGERY Left   . GANGLION CYST EXCISION Right 07/09/2018   Procedure: EXCISION, RIGHT VOLAR WRIST GANGLION;  Surgeon: [name], MD;  Location: [facility];  Service: Orthopedics;  Laterality: Right;  . MOLE REMOVAL    . WISDOM TOOTH EXTRACTION      Current Outpatient Medications  Medication Sig Dispense Refill  . albuterol (PROAIR HFA) 108 (90 Base) MCG/ACT inhaler Inhale 2 puffs into the lungs every 6 (six) hours as needed for wheezing or shortness of breath. 18 g 3  . Ascorbic Acid (VITAMIN C) 100 MG tablet Take 100 mg by mouth daily.    . Cholecalciferol (VITAMIN D3) 5000 units TABS Take 2 tablets by mouth daily. Takes 2 daily     . cyclobenzaprine (FLEXERIL) 10 MG tablet Take 1 tablet (10 mg total) by mouth 3 (three) times daily as needed for muscle spasms. (Patient taking differently: Take 10 mg by mouth as needed for muscle spasms. ) 60 tablet 0  . diphenhydrAMINE (BENADRYL) 25 MG tablet Take 2 tablets (50 mg total) by mouth every 4 (four) hours as needed (bee sting). (Patient taking differently: Take 50 mg by mouth as needed (bee sting). ) 30 tablet 3  . ibuprofen (ADVIL) 600 MG tablet Take twice a day x 1 week, then prn pain 60 tablet 1  . LORazepam (ATIVAN) 0.5 MG tablet Take 1 tablet (0.5 mg total) by mouth at bedtime as needed. 30 tablet 1   . medroxyPROGESTERone (PROVERA) 10 MG tablet Take 1 (10 mg)  tablet every day for 10 days every 2 months. 30 tablet 0  . Metoprolol Succinate 50 MG CS24 Take by mouth.    . olmesartan (BENICAR) 40 MG tablet Take 1 tablet (40 mg total) by mouth daily. 90 tablet 1  . omeprazole (PRILOSEC) 40 MG capsule Take 1 capsule (40 mg total) by mouth daily. 90 capsule 3  . ondansetron (ZOFRAN ODT) 4 MG disintegrating tablet Take 1 tablet (4 mg total) by mouth every 8 (eight) hours as needed for nausea or vomiting. (Patient  taking differently: Take 4 mg by mouth as needed for nausea or vomiting. ) 20 tablet 1  . Specialty Vitamins Products (MENOPAUSE SUPPORT) TABS Take 1 tablet by mouth daily.     [name] triamterene-hydrochlorothiazide (MAXZIDE-25) 37.5-25 MG tablet TAKE 1 TABLET BY MOUTH DAILY 90 tablet 3  . phentermine (ADIPEX-P) 37.5 MG tablet Take 1 tablet (37.5 mg total) by mouth daily before breakfast. 30 tablet 1   No current facility-administered medications for this visit.   Facility-Administered Medications Ordered in Other Visits  Medication Dose Route Frequency Provider Last Rate Last Admin  . aminophylline injection 75 mg  75 mg Intravenous BID PRN Dorothy Spark, MD   75 mg at 04/13/15 1037     ALLERGIES: Bee venom; Biaxin [clarithromycin]; Amlodipine; Betadine [povidone iodine]; Chloraprep one step [chlorhexidine gluconate]; Duraprep [antiseptic products, misc.]; and Keflex [cephalexin]  Family History  Problem Relation Age of Onset  . Breast cancer Mother 43       breast  . Diabetes Mother   . Fibromyalgia Mother   . Heart disease Mother        CHF  . Hypertension Mother   . Kidney disease Mother   . Heart disease Father   . Emphysema Father   . Diabetes Brother   . Hypertension Brother        x 2  . Heart disease Maternal Grandmother   . Diabetes Maternal Grandmother   . Asthma Maternal Grandmother   . Arthritis Maternal Grandmother   . Heart disease Maternal Grandfather   .  Arthritis Maternal Grandfather   . Cancer Maternal Grandfather        bladder  . Breast cancer Paternal Grandmother   . Heart disease Paternal Grandmother   . Arthritis Paternal Grandmother   . Stroke Paternal Grandmother   . Ovarian cancer Paternal Grandmother   . Diabetes Paternal Grandfather   . Heart disease Paternal Grandfather   . Colon cancer Paternal Grandfather        mets  . Arthritis Paternal Grandfather   . Stomach cancer Paternal Grandfather        mets to stomach  . Hypertension Brother   . Cancer Sister        ?endometrial ca  . Esophageal cancer Neg Hx   . Pancreatic cancer Neg Hx   . Rectal cancer Neg Hx     Social History   Socioeconomic History  . Marital status: Married    Spouse name: [name]  . Number of children: 4  . Years of education: college  . Highest education level: Not on file  Occupational History  . 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 Smoking status: Never Smoker  . Smokeless tobacco: Never Used  Substance and Sexual Activity  . Alcohol use: Yes    Alcohol/week: 1.0 standard drinks    Types: 1 Glasses of wine per week  . Drug use: No  . Sexual activity: Yes    Birth control/protection: None, Post-menopausal  Other Topics Concern  . Not on file  Social History Narrative   Married 2 sons 2 daughters. SUPERVISOR at [employer]   One caffeinated beverage daily   This was updated 12/12/2013   Social Determinants of Health   Financial Resource Strain:   . Difficulty of Paying Living Expenses:   Food Insecurity:   . Worried About Charity fundraiser in the Last Year:   . Arboriculturist in  the Last Year:   Transportation Needs:   . Film/video editor (Medical):   [name] Lack of Transportation (Non-Medical):   Physical Activity:   . Days of Exercise per Week:   . Minutes of Exercise per Session:   Stress:   . Feeling of Stress :   Social Connections:   . Frequency of  Communication with Friends and Family:   . Frequency of Social Gatherings with Friends and Family:   . Attends Religious Services:   . Active Member of Clubs or Organizations:   . Attends Archivist Meetings:   [name] Marital Status:   Intimate Partner Violence:   . Fear of Current or Ex-Partner:   . Emotionally Abused:   [name] Physically Abused:   . Sexually Abused:     Review of Systems  See HPI.   PHYSICAL EXAMINATION:    BP 118/80 (BP Location: Left Arm, Patient Position: Sitting, Cuff Size: Large)   Pulse 72   Temp (!) 97.5 F (36.4 C) (Temporal)   Ht 5\' 9"  (1.753 m)   Wt 233 lb (105.7 kg)   LMP 08/20/2019   BMI 34.41 kg/m     General appearance: alert, cooperative and appears stated age  ASSESSMENT   Perimenopause.  Hx abnormal uterine bleeding.  Taking cyclic Provera every 2 months.  FH uterine cancer.  Patient has had negative EMBs. RLQ pain.  Chronic.    PLAN  Will continue Provera 10 mg x 10 days every 2 months.  I discussed alternatives to Provera - Micronor, Mirena, and hysterectomy, all of which she declines.  AEX in Jan. 2022.  FU in August if needed. She will FU with her PCP.  I told her I was happy to collaborate in any way that I could.   An After Visit Summary was printed and given to the patient.

## 2019-10-08 NOTE — Assessment & Plan Note (Addendum)
RLQ abd pain x months, worse now. 6-7/10 at times No n/v. No fever. Once woke up w/pain. Pt saw Dr Oneida Alar - GI and Dr Quin[place] Simmonds.  BMET Abd CT

## 2019-10-08 NOTE — Progress Notes (Signed)
Subjective:  Patient ID: Caroline Wong, female    DOB: Apr 24, 1961  Age: 59 y.o. MRN: [id number]  CC: No chief complaint on file.   HPI Caroline Wong presents for RLQ abd pain x  Months, worse now. 6-7/10 at times No n/v. No fever. Once woke up w/pain. Pt saw Dr Oneida Alar - GI and Dr Quin[place] Simmonds.   Outpatient Medications Prior to Visit  Medication Sig Dispense Refill  . albuterol (PROAIR HFA) 108 (90 Base) MCG/ACT inhaler Inhale 2 puffs into the lungs every 6 (six) hours as needed for wheezing or shortness of breath. 18 g 3  . Ascorbic Acid (VITAMIN C) 100 MG tablet Take 100 mg by mouth daily.    . Cholecalciferol (VITAMIN D3) 5000 units TABS Take 2 tablets by mouth daily. Takes 2 daily     . cyclobenzaprine (FLEXERIL) 10 MG tablet Take 1 tablet (10 mg total) by mouth 3 (three) times daily as needed for muscle spasms. (Patient taking differently: Take 10 mg by mouth as needed for muscle spasms. ) 60 tablet 0  . diphenhydrAMINE (BENADRYL) 25 MG tablet Take 2 tablets (50 mg total) by mouth every 4 (four) hours as needed (bee sting). (Patient taking differently: Take 50 mg by mouth as needed (bee sting). ) 30 tablet 3  . ibuprofen (ADVIL) 600 MG tablet Take twice a day x 1 week, then prn pain 60 tablet 1  . LORazepam (ATIVAN) 0.5 MG tablet Take 1 tablet (0.5 mg total) by mouth at bedtime as needed. 30 tablet 1  . medroxyPROGESTERone (PROVERA) 10 MG tablet Take 1 (10 mg)  tablet every day for 10 days every 2 months. 30 tablet 0  . Metoprolol Succinate 50 MG CS24 Take by mouth.    . olmesartan (BENICAR) 40 MG tablet Take 1 tablet (40 mg total) by mouth daily. 90 tablet 1  . omeprazole (PRILOSEC) 40 MG capsule Take 1 capsule (40 mg total) by mouth daily. 90 capsule 3  . ondansetron (ZOFRAN ODT) 4 MG disintegrating tablet Take 1 tablet (4 mg total) by mouth every 8 (eight) hours as needed for nausea or vomiting. (Patient taking differently: Take 4 mg by mouth as needed for nausea or vomiting. ) 20  tablet 1  . Specialty Vitamins Products (MENOPAUSE SUPPORT) TABS Take 1 tablet by mouth daily.     [name] triamterene-hydrochlorothiazide (MAXZIDE-25) 37.5-25 MG tablet TAKE 1 TABLET BY MOUTH DAILY 90 tablet 3  . phentermine (ADIPEX-P) 37.5 MG tablet Take 1 tablet (37.5 mg total) by mouth daily before breakfast. 30 tablet 1   Facility-Administered Medications Prior to Visit  Medication Dose Route Frequency Provider Last Rate Last Admin  . aminophylline injection 75 mg  75 mg Intravenous BID PRN Dorothy Spark, MD   75 mg at 04/13/15 1037    ROS: Review of Systems  Objective:  BP 128/86 (BP Location: Left Arm, Patient Position: Sitting, Cuff Size: Large)   Pulse 74   Temp 98.6 F (37 C) (Oral)   Ht 5\' 9"  (1.753 m)   Wt 233 lb (105.7 kg)   LMP 08/20/2019   SpO2 96%   BMI 34.41 kg/m   BP Readings from Last 3 Encounters:  10/08/19 128/86  10/08/19 118/80  09/10/19 130/80    Wt Readings from Last 3 Encounters:  10/08/19 233 lb (105.7 kg)  10/08/19 233 lb (105.7 kg)  09/10/19 232 lb (105.2 kg)    Physical Exam  Lab Results  Component Value Date   WBC 7.9  09/10/2019   HGB 14.1 09/10/2019   HCT 42.2 09/10/2019   PLT 267.0 09/10/2019   GLUCOSE 99 09/10/2019   CHOL 165 03/20/2019   TRIG 148.0 03/20/2019   HDL 47.90 03/20/2019   LDLCALC 88 03/20/2019   ALT 33 09/10/2019   AST 23 09/10/2019   NA 138 09/10/2019   K 3.9 09/10/2019   CL 102 09/10/2019   CREATININE 1.05 09/10/2019   BUN 23 09/10/2019   CO2 29 09/10/2019   TSH 0.95 09/10/2019   INR 0.9 02/06/2018   HGBA1C 6.7 (H) 09/10/2019    No results found.  Assessment & Plan:    Walker Kehr, MD

## 2019-10-14 ENCOUNTER — Other Ambulatory Visit: Payer: Self-pay | Admitting: Internal Medicine

## 2019-10-16 ENCOUNTER — Other Ambulatory Visit: Payer: Self-pay | Admitting: Internal Medicine

## 2019-10-17 ENCOUNTER — Other Ambulatory Visit: Payer: Federal, State, Local not specified - PPO

## 2019-10-21 ENCOUNTER — Other Ambulatory Visit: Payer: Federal, State, Local not specified - PPO

## 2019-10-22 ENCOUNTER — Ambulatory Visit (INDEPENDENT_AMBULATORY_CARE_PROVIDER_SITE_OTHER)
Admission: RE | Admit: 2019-10-22 | Discharge: 2019-10-22 | Disposition: A | Discharge status: Home / Self Care | Payer: Federal, State, Local not specified - PPO | Source: Ambulatory Visit | Attending: Internal Medicine | Admitting: Internal Medicine

## 2019-10-22 ENCOUNTER — Other Ambulatory Visit: Payer: Self-pay

## 2019-10-22 DIAGNOSIS — R1031 Right lower quadrant pain: Secondary | ICD-10-CM

## 2019-10-22 DIAGNOSIS — K760 Fatty (change of) liver, not elsewhere classified: Secondary | ICD-10-CM | POA: Diagnosis not present

## 2019-10-22 MED ORDER — IOHEXOL 300 MG/ML  SOLN
100.0000 mL | Freq: Once | INTRAMUSCULAR | Status: AC | PRN
  Administered 2019-10-22: 100 mL via INTRAVENOUS

## 2019-11-23 ENCOUNTER — Other Ambulatory Visit: Payer: Self-pay | Admitting: Internal Medicine

## 2019-11-27 ENCOUNTER — Encounter: Payer: Self-pay | Admitting: Internal Medicine

## 2019-11-27 ENCOUNTER — Telehealth (INDEPENDENT_AMBULATORY_CARE_PROVIDER_SITE_OTHER): Payer: Federal, State, Local not specified - PPO | Admitting: Internal Medicine

## 2019-11-27 DIAGNOSIS — B349 Viral infection, unspecified: Secondary | ICD-10-CM

## 2019-11-27 DIAGNOSIS — R110 Nausea: Secondary | ICD-10-CM | POA: Diagnosis not present

## 2019-11-27 NOTE — Assessment & Plan Note (Signed)
Zofran po prn

## 2019-11-27 NOTE — Assessment & Plan Note (Signed)
COVID test today OTC meds for fever Stay off work through [place] if worse

## 2019-11-27 NOTE — Progress Notes (Signed)
Virtual Visit via Video Note  I connected with Caroline Wong on 11/27/19 at  2:40 PM EDT by a video enabled telemedicine application and verified that I am speaking with the correct person using two identifiers.   I discussed the limitations of evaluation and management by telemedicine and the availability of in person appointments. The patient expressed understanding and agreed to proceed.  I was located at our [facility] office. The patient was at home. There was no one else present in the visit.   History of Present Illness: C/o chills, aches, nausea and weakness starting this am they were on vacation prior. Not vaccinated for COVID19.  There has been no runny nose, cough, chest pain, shortness of breath, abdominal pain, diarrhea, constipation, skin rashes.   Observations/Objective: The patient appears to be in no acute distress, looks tired; in bedl.  Assessment and Plan:  See my Assessment and Plan. Follow Up Instructions:    I discussed the assessment and treatment plan with the patient. The patient was provided an opportunity to ask questions and all were answered. The patient agreed with the plan and demonstrated an understanding of the instructions.   The patient was advised to call back or seek an in-person evaluation if the symptoms worsen or if the condition fails to improve as anticipated.  I provided face-to-face time during this encounter. We were at different locations.   Walker Kehr, MD

## 2019-11-28 ENCOUNTER — Telehealth: Payer: Self-pay

## 2019-11-28 MED ORDER — AZITHROMYCIN 500 MG PO TABS: 500.0000 mg | ORAL_TABLET | Freq: Every day | ORAL | 0 refills | Status: AC

## 2019-11-28 MED ORDER — DICYCLOMINE HCL 10 MG PO CAPS
10.0000 mg | ORAL_CAPSULE | Freq: Three times a day (TID) | ORAL | 0 refills | Status: DC | PRN
Start: 2019-11-28 — End: 2020-01-09

## 2019-11-28 NOTE — Addendum Note (Signed)
Addended by: Mahala Menghini on: 11/28/2019 02:06 PM   Modules accepted: Orders

## 2019-11-28 NOTE — Telephone Encounter (Signed)
Noted. Spoke with pt. Pt is aware of LSL recommendations and will continue to wear gloves when handling chickens, eggs, ect and keep hands washed with soap and water. Pt is aware that Antibiotic and Dicyclomine have been sent to [facility]. If pt is feeling better after 3 days, she is aware that she can d/c med. If pt continues to have symptoms, she will take the full 7 days. Pt understands treatment and will start medication.

## 2019-11-28 NOTE — Telephone Encounter (Signed)
Pt called to ask if she could be treated with antibiotics and Bentyl. Pt was treated 11/2018 at the ED and [facility] for a bacteria. Pt states the bacteria comes from caring for her Chickens. Pt has c/o water diarrhea that started a few days ago, abdominal cramping, chills and muscle aches. Pt reports no n/v, no coughing, no nasal drainage, no sob, no congestion. [facility] but hasn't received results yet. Pt says she knows it's the bacteria from the chickens. Please advise.

## 2019-11-28 NOTE — Telephone Encounter (Addendum)
She has history of Campylobacter back in 2020.  Both her and her husband have had it, comes from caring for her chickens.  Consider wearing gloves when handling chickens, eggs, etc.  Be sure to wash hands with soap and water after handling farm animals.  We will send in azithromycin 500 mg p.o. daily.  I am sending in a 7-day supply however it is appropriate to stop after 3 days if she is feeling a lot better.  If she continues to have significant symptoms, complete entire 7 days.  We will send in dicyclomine as well for abdominal cramping. Both Rxs sent to [facility] [address]  She is to call if no improvement.

## 2019-11-29 ENCOUNTER — Telehealth: Payer: Federal, State, Local not specified - PPO | Admitting: Internal Medicine

## 2019-11-29 NOTE — Progress Notes (Signed)
Virtual Visit via Video Note  I attempted to connect with Caroline Wong on 11/29/19 at  9:00 AM EDT by a video enabled telemedicine application however she was unavailable. Her phone number went straight to voicemail and this was full so a message was unable to be left for her.     Hoyt Koch, MD

## 2019-12-10 ENCOUNTER — Other Ambulatory Visit: Payer: Self-pay | Admitting: Obstetrics and Gynecology

## 2019-12-10 DIAGNOSIS — Z1231 Encounter for screening mammogram for malignant neoplasm of breast: Secondary | ICD-10-CM

## 2019-12-10 MED ORDER — OMEPRAZOLE 40 MG PO CPDR: 40.0000 mg | DELAYED_RELEASE_CAPSULE | Freq: Every day | ORAL | 3 refills | Status: DC

## 2019-12-26 ENCOUNTER — Other Ambulatory Visit: Payer: Self-pay

## 2019-12-26 ENCOUNTER — Ambulatory Visit
Admission: RE | Admit: 2019-12-26 | Discharge: 2019-12-26 | Disposition: A | Discharge status: Home / Self Care | Payer: Federal, State, Local not specified - PPO | Source: Ambulatory Visit | Attending: Obstetrics and Gynecology | Admitting: Obstetrics and Gynecology

## 2019-12-26 DIAGNOSIS — Z1231 Encounter for screening mammogram for malignant neoplasm of breast: Secondary | ICD-10-CM | POA: Diagnosis not present

## 2020-01-08 ENCOUNTER — Telehealth: Payer: Self-pay

## 2020-01-08 NOTE — Telephone Encounter (Signed)
Spoke with patient. Patient is scheduled for a f/u OV with Dr. Quincy Simmonds on 8/5 at 3pm. She has been taking cyclic provera as prescribed, reports menses was heavier in July and she continues to have intermittent chronic RLQ pain. Patient reports she has been seen by GI and PCP for RLQ pain, evaluation negative per patient.   Advised patient to keep OV as scheduled, patient requested her husband accompany her for the OV. Advised OK for spouse to accompany her for OV, reviewed Covid 19 precautions. Advised patient I will return call if any additional recommendations. Patient agreeable.    Routing to provider for final review. Patient is agreeable to disposition. Will close encounter.

## 2020-01-08 NOTE — Telephone Encounter (Signed)
Patient called to see if upcoming office visit can be virtual.

## 2020-01-09 ENCOUNTER — Ambulatory Visit: Payer: Federal, State, Local not specified - PPO | Admitting: Obstetrics and Gynecology

## 2020-01-09 ENCOUNTER — Encounter: Payer: Self-pay | Admitting: Obstetrics and Gynecology

## 2020-01-09 ENCOUNTER — Other Ambulatory Visit: Payer: Self-pay

## 2020-01-09 VITALS — BP 136/82 | HR 66 | Ht 66.5 in | Wt 236.6 lb

## 2020-01-09 DIAGNOSIS — R102 Pelvic and perineal pain: Secondary | ICD-10-CM | POA: Diagnosis not present

## 2020-01-09 DIAGNOSIS — N924 Excessive bleeding in the premenopausal period: Secondary | ICD-10-CM | POA: Diagnosis not present

## 2020-01-09 NOTE — Progress Notes (Signed)
GYNECOLOGY  VISIT   HPI: 59 y.o.   Married  Caucasian  female   G2P2002 with Patient's last menstrual period was 08/20/2019.   here for follow up on cyclic Provera. Patient complaining of heavier cycle in July, still having pelvic pain bilaterally. Negative work up with GI and PCP.  Patient's husband is present for the entire visit today.   Patient is being treated for abnormal perimenopausal bleeding.  She does bleed following the cyclic Provera.  She is having heavy bleeding and changing a pad every few hours and lasting 7 days.  Clotting occurring. She has abdominal pain almost half of the month, and it comes and goes.  Her pain is in her lower abdomen right and left.  Her discomfort is a heaviness and a pain, but not necessarily worse during her cycle.   Negative EMB.   FH uterine cancer.   Her goal is to get rid of the pain.   GYNECOLOGIC HISTORY: Patient's last menstrual period was 08/20/2019. Contraception:  PMP Menopausal hormone therapy: cyclic Provera Last mammogram: 12-26-19 3D/Neg/density B/Birads1 Last pap smear: 06-11-19 Neg:Neg HR HPV,10-24-17 Neg:Neg HR HPV,08-19-15 Neg:Neg HR HPV        OB History    Gravida  2   Para  2   Term  2   Preterm      AB      Living  2     SAB      TAB      Ectopic      Multiple      Live Births  2              Patient Active Problem List   Diagnosis Date Noted  . Acute viral syndrome 11/27/2019  . Nausea 11/27/2019  . Hyperglycemia 09/10/2019  . Body mass index (BMI) 35.0-35.9, adult 08/22/2019  . Endometrial polyp   . Asthmatic bronchitis 01/30/2019  . Acute infectious diarrhea 11/27/2018  . Colon cancer screening 11/14/2018  . Ganglion cyst of volar aspect of right wrist   . Allergic rhinitis 06/12/2018  . Panic attacks 02/06/2018  . Elevated glucose 02/06/2018  . Petechial rash 01/05/2018  . Epistaxis 01/05/2018  . Screening for colorectal cancer 10/24/2017  . Encounter for gynecological  examination with Papanicolaou smear of cervix 10/24/2017  . Cough 06/12/2017  . Upper respiratory tract infection 06/12/2017  . Anxiety 11/14/2016  . UTI (urinary tract infection) 11/11/2015  . Diarrhea 11/11/2015  . GERD (gastroesophageal reflux disease) 10/23/2015  . BP check 09/24/2015  . Urge incontinence 08/19/2015  . Stye external 07/15/2015  . S/P cervical spinal fusion 06/05/2015  . Acute sinusitis 05/20/2015  . Fatigue 05/13/2015  . Edema 04/01/2015  . Chest pain 04/01/2015  . Nonspecific abnormal electrocardiogram (ECG) (EKG) 04/01/2015  . 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 Neck pain, bilateral posterior 12/31/2014  . Lower back pain 12/31/2014  . Yeast infection 05/07/2014  . Obesity 05/07/2014  . Night sweats 04/14/2014  . Abdominal pain 11/25/2013  . Hormone replacement therapy (HRT) 08/21/2013  . Well adult exam 05/16/2013  . Hypertension     Past Medical History:  Diagnosis Date  . Abdominal pain 11/25/2013   Has some nausea associated with pain that has been on and off x 2 weeks will get [place]  . Anxiety    Takes Lorazepam  . Chronic bronchitis ([place])   . Endometrial polyp   . Gallstones   . GERD (gastroesophageal reflux disease)   . Hormone replacement therapy (HRT)   .  Hypertension   . Night sweats 04/14/2014  . Obesity 05/07/2014  . STD (sexually transmitted disease)    HSV ?carrier--never had an outbreak but exposed from ex-husband  . Urge incontinence 08/19/2015  . Yeast infection 05/07/2014    Past Surgical History:  Procedure Laterality Date  . ANTERIOR CERVICAL DECOMP/DISCECTOMY FUSION N/A 06/05/2015   Procedure: C5-6, C6-7 Anterior Cervical Discectomy and Fusion, Allograft, Plate;  Surgeon: [name], MD;  Location: [facility];  Service: Orthopedics;  Laterality: N/A;  . ANTERIOR FUSION CERVICAL SPINE  06/05/2015   C 5 6 7    . CHOLECYSTECTOMY    . COLONOSCOPY  2015   NO POLYPS  . FOOT SURGERY Left   . GANGLION CYST EXCISION Right  07/09/2018   Procedure: EXCISION, RIGHT VOLAR WRIST GANGLION;  Surgeon: [name], MD;  Location: [facility];  Service: Orthopedics;  Laterality: Right;  . MOLE REMOVAL    . WISDOM TOOTH EXTRACTION      Current Outpatient Medications  Medication Sig Dispense Refill  . Cholecalciferol (VITAMIN D3) 5000 units TABS Take 2 tablets by mouth daily. Takes 2 daily     . cyclobenzaprine (FLEXERIL) 10 MG tablet Take 1 tablet (10 mg total) by mouth 3 (three) times daily as needed for muscle spasms. (Patient taking differently: Take 10 mg by mouth as needed for muscle spasms. ) 60 tablet 0  . diphenhydrAMINE (BENADRYL) 25 MG tablet Take 2 tablets (50 mg total) by mouth every 4 (four) hours as needed (bee sting). (Patient taking differently: Take 50 mg by mouth as needed (bee sting). ) 30 tablet 3  . ibuprofen (ADVIL) 600 MG tablet Take twice a day x 1 week, then prn pain 60 tablet 1  . LORazepam (ATIVAN) 0.5 MG tablet TAKE 1 TABLET(0.5 MG) BY MOUTH AT BEDTIME AS NEEDED 30 tablet 3  . medroxyPROGESTERone (PROVERA) 10 MG tablet Take 1 (10 mg)  tablet every day for 10 days every 2 months. 30 tablet 0  . Metoprolol Succinate 50 MG CS24 Take by mouth.    . olmesartan (BENICAR) 40 MG tablet Take 1 tablet (40 mg total) by mouth daily. 90 tablet 1  . omeprazole (PRILOSEC) 40 MG capsule Take 1 capsule (40 mg total) by mouth daily. 90 capsule 3  . ondansetron (ZOFRAN-ODT) 4 MG disintegrating tablet DISSOLVE 1 TABLET(4 MG) ON THE TONGUE EVERY 8 HOURS AS NEEDED FOR NAUSEA OR VOMITING 20 tablet 1  . phentermine (ADIPEX-P) 37.5 MG tablet TAKE 1 TABLET(37.5MG ) BY MOUTH DAILY BEFORE BREAKFAST 30 tablet 2  . triamterene-hydrochlorothiazide (MAXZIDE-25) 37.5-25 MG tablet TAKE 1 TABLET BY MOUTH DAILY 90 tablet 3   No current facility-administered medications for this visit.   Facility-Administered Medications Ordered in Other Visits  Medication Dose Route Frequency Provider Last Rate Last Admin  .  aminophylline injection 75 mg  75 mg Intravenous BID PRN Dorothy Spark, MD   75 mg at 04/13/15 1037     ALLERGIES: Bee venom; Biaxin [clarithromycin]; Amlodipine; Betadine [povidone iodine]; Chloraprep one step [chlorhexidine gluconate]; Duraprep [antiseptic products, misc.]; and Keflex [cephalexin]  Family History  Problem Relation Age of Onset  . Breast cancer Mother 52       breast  . Diabetes Mother   . Fibromyalgia Mother   . Heart disease Mother        CHF  . Hypertension Mother   . Kidney disease Mother   . Heart disease Father   . Emphysema Father   . Diabetes Brother   .  Hypertension Brother        x 2  . Heart disease Maternal Grandmother   . Diabetes Maternal Grandmother   . Asthma Maternal Grandmother   . Arthritis Maternal Grandmother   . Heart disease Maternal Grandfather   . Arthritis Maternal Grandfather   . Cancer Maternal Grandfather        bladder  . Breast cancer Paternal Grandmother   . Heart disease Paternal Grandmother   . Arthritis Paternal Grandmother   . Stroke Paternal Grandmother   . Ovarian cancer Paternal Grandmother   . Diabetes Paternal Grandfather   . Heart disease Paternal Grandfather   . Colon cancer Paternal Grandfather        mets  . Arthritis Paternal Grandfather   . Stomach cancer Paternal Grandfather        mets to stomach  . Hypertension Brother   . Cancer Sister        ?endometrial ca  . Breast cancer Maternal Aunt   . Breast cancer Paternal Aunt   . Esophageal cancer Neg Hx   . Pancreatic cancer Neg Hx   . Rectal cancer Neg Hx     Social History   Socioeconomic History  . Marital status: Married    Spouse name: [name]  . Number of children: 4  . Years of education: college  . Highest education level: Not on file  Occupational History  . 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 Smoking status: Never Smoker  . Smokeless tobacco: Never Used  Vaping Use  . Vaping Use:  Never used  Substance and Sexual Activity  . Alcohol use: Yes    Alcohol/week: 1.0 standard drink    Types: 1 Glasses of wine per week  . Drug use: No  . Sexual activity: Yes    Birth control/protection: None, Post-menopausal  Other Topics Concern  . Not on file  Social History Narrative   Married 2 sons 2 daughters. SUPERVISOR at [employer]   One caffeinated beverage daily   This was updated 12/12/2013   Social Determinants of Health   Financial Resource Strain:   . Difficulty of Paying Living Expenses:   Food Insecurity:   . Worried About Charity fundraiser in the Last Year:   . Arboriculturist in the Last Year:   Transportation Needs:   . Film/video editor (Medical):   [name] Lack of Transportation (Non-Medical):   Physical Activity:   . Days of Exercise per Week:   . Minutes of Exercise per Session:   Stress:   . Feeling of Stress :   Social Connections:   . Frequency of Communication with Friends and Family:   . Frequency of Social Gatherings with Friends and Family:   . Attends Religious Services:   . Active Member of Clubs or Organizations:   . Attends Archivist Meetings:   [name] Marital Status:   Intimate Partner Violence:   . Fear of Current or Ex-Partner:   . Emotionally Abused:   [name] Physically Abused:   . Sexually Abused:     Review of Systems  Genitourinary: Positive for pelvic pain.  All other systems reviewed and are negative.   PHYSICAL EXAMINATION:    BP 136/82 (Cuff Size: Large)   Pulse 66   Ht 5' 6.5" (1.689 m)   Wt 236 lb 9.6 oz (107.3 kg)   LMP 08/20/2019   BMI 37.62 kg/m  General appearance: alert, cooperative and appears stated age  Pelvic: External genitalia:  no lesions              Urethra:  normal appearing urethra with no masses, tenderness or lesions              Bartholins and Skenes: normal                 Vagina: normal appearing vagina with normal color and discharge, no lesions              Cervix: no  lesions                Bimanual Exam:  Uterus:  normal size, contour, position, consistency, mobility, non-tender              Adnexa: no mass, fullness, tenderness              Chaperone was present for exam.  ASSESSMENT  Chronic pelvic pain.  Abnormal perimenopausal bleeding treated with cyclic Provera.  PLAN  We discussed Aygestin, Mirena, and laparoscopic hysterectomy with BSO. I discussed total laparoscopic hysterectomy with bilateral salpingectomy and possible bilateral oophorectomy. Risks include but are not limited to bleeding, infection, damage to surrounding organs, pneumonia, reaction to anesthesia, DVT, PE, death, need for reoperation, vaginal cuff dehiscence, neuropathy, continued pain, and need to convert to a traditional laparotomy incision to complete the procedure. Patient will consider her options.  Questions invited and answered.

## 2020-01-09 NOTE — Patient Instructions (Signed)
Hysterectomy Information  A hysterectomy is a surgery in which the uterus is removed. The fallopian tubes and ovaries may be removed (bilateral salpingo-oophorectomy) as well. This procedure may be done to treat various medical problems. After the procedure, a woman will no longer have menstrual periods nor will she be able to become pregnant (sterile). What are the reasons for a hysterectomy? There are many reasons why a woman might have this procedure. They include:  Persistent, abnormal vaginal bleeding.  Long-term (chronic) pelvic pain or infection.  Endometriosis. This is when the lining of the uterus (endometrium) starts to grow outside the uterus.  Adenomyosis. This is when the endometrium starts to grow in the muscle of the uterus.  Pelvic organ prolapse. This is a condition in which the uterus falls down into the vagina.  Noncancerous growths in the uterus (uterine fibroids) that cause symptoms.  The presence of precancerous cells.  Cervical or uterine cancer. What are the different types of hysterectomy? There are three different types of hysterectomy:  Supracervical hysterectomy. In this type, the top part of the uterus is removed, but not the cervix.  Total hysterectomy. In this type, the uterus and cervix are removed.  Radical hysterectomy. In this type, the uterus, the cervix, and the tissue that holds the uterus in place (parametrium) are removed. What are the different ways a hysterectomy can be performed? There are many different ways a hysterectomy can be performed, including:  Abdominal hysterectomy. In this type, an incision is made in the abdomen. The uterus is removed through this incision.  Vaginal hysterectomy. In this type, an incision is made in the vagina. The uterus is removed through this incision. There are no abdominal incisions.  Conventional laparoscopic hysterectomy. In this type, three or four small incisions are made in the abdomen. A thin,  lighted tube with a camera (laparoscope) is inserted into one of the incisions. Other tools are put through the other incisions. The uterus is cut into small pieces. The small pieces are removed through the incisions or through the vagina.  Laparoscopically assisted vaginal hysterectomy (LAVH). In this type, three or four small incisions are made in the abdomen. Part of the surgery is performed laparoscopically and the other part is done vaginally. The uterus is removed through the vagina.  Robot-assisted laparoscopic hysterectomy. In this type, a laparoscope and other tools are inserted into three or four small incisions in the abdomen. A computer-controlled device is used to give the surgeon a 3D image and to help control the surgical instruments. This allows for more precise movements of surgical instruments. The uterus is cut into small pieces and removed through the incisions or removed through the vagina. Discuss the options with your health care provider to determine which type is the right one for you. What are the risks? Generally, this is a safe procedure. However, problems may occur, including:  Bleeding and risk of blood transfusion. Tell your health care provider if you do not want to receive any blood products.  Blood clots in the legs or lung.  Infection.  Damage to other structures or organs.  Allergic reactions to medicines.  Changing to an abdominal hysterectomy from one of the other techniques. What to expect after a hysterectomy  You will be given pain medicine.  You may need to stay in the hospital for 1- 2 days to recover, depending on the type of hysterectomy you had.  Follow your health care provider's instructions about exercise, driving, and general activities. Ask your  health care provider what activities are safe for you.  You will need to have someone with you for the first 3-5 days after you go home.  You will need to follow up with your surgeon in 2-4  weeks after surgery to evaluate your progress.  If the ovaries are removed, you will have early menopause symptoms such as hot flashes, night sweats, and insomnia.  If you had a hysterectomy for a problem that was not cancer or not a condition that could lead to cancer, then you no longer need Pap tests. However, even if you no longer need a Pap test, a regular pelvic exam is a good idea to make sure no other problems are developing. Questions to ask your health care provider  Is a hysterectomy medically necessary? Do I have other treatment options for my condition?  What are my options for hysterectomy procedure?  What organs and tissues need to be removed?  What are the risks?  What are the benefits?  How long will I need to stay in the hospital after the procedure?  How long will I need to recover at home?  What symptoms can I expect after the procedure? Summary  A hysterectomy is a surgery in which the uterus is removed. The fallopian tubes and ovaries may be removed (bilateral salpingo-oophorectomy) as well.  This procedure may be done to treat various medical problems. After the procedure, a woman will no longer have menstrual periods nor will she be able to become pregnant.  Discuss the options with your health care provider to determine which type of hysterectomy is the right one for you. This information is not intended to replace advice given to you by your health care provider. Make sure you discuss any questions you have with your health care provider. Document Revised: 05/05/2017 Document Reviewed: 06/29/2016 Elsevier Patient Education  [place]. Levonorgestrel intrauterine device (IUD) What is this medicine? LEVONORGESTREL IUD (LEE voe nor jes trel) is a contraceptive (birth control) device. The device is placed inside the uterus by a healthcare professional. It is used to prevent pregnancy. This device can also be used to treat heavy bleeding that occurs  during your period. This medicine may be used for other purposes; ask your health care provider or pharmacist if you have questions. COMMON BRAND NAME(S): Minette [place] What should I tell my health care provider before I take this medicine? They need to know if you have any of these conditions:  abnormal Pap smear  cancer of the breast, uterus, or cervix  diabetes  endometritis  genital or pelvic infection now or in the past  have more than one sexual partner or your partner has more than one partner  heart disease  history of an ectopic or tubal pregnancy  immune system problems  IUD in place  liver disease or tumor  problems with blood clots or take blood-thinners  seizures  use intravenous drugs  uterus of unusual shape  vaginal bleeding that has not been explained  an unusual or allergic reaction to levonorgestrel, other hormones, silicone, or polyethylene, medicines, foods, dyes, or preservatives  pregnant or trying to get pregnant  breast-feeding How should I use this medicine? This device is placed inside the uterus by a health care professional. Talk to your pediatrician regarding the use of this medicine in children. Special care may be needed. Overdosage: If you think you have taken too much of this medicine contact a poison control center or emergency room  at once. NOTE: This medicine is only for you. Do not share this medicine with others. What if I miss a dose? This does not apply. Depending on the brand of device you have inserted, the device will need to be replaced every 3 to 6 years if you wish to continue using this type of birth control. What may interact with this medicine? Do not take this medicine with any of the following medications:  amprenavir  bosentan  fosamprenavir This medicine may also interact with the following medications:  aprepitant  armodafinil  barbiturate medicines for inducing sleep or treating  seizures  bexarotene  boceprevir  griseofulvin  medicines to treat seizures like carbamazepine, ethotoin, felbamate, oxcarbazepine, phenytoin, topiramate  modafinil  pioglitazone  rifabutin  rifampin  rifapentine  some medicines to treat HIV infection like atazanavir, efavirenz, indinavir, lopinavir, nelfinavir, tipranavir, ritonavir  St. John's wort  warfarin This list may not describe all possible interactions. Give your health care provider a list of all the medicines, herbs, non-prescription drugs, or dietary supplements you use. Also tell them if you smoke, drink alcohol, or use illegal drugs. Some items may interact with your medicine. What should I watch for while using this medicine? Visit your doctor or health care professional for regular check ups. See your doctor if you or your partner has sexual contact with others, becomes HIV positive, or gets a sexual transmitted disease. This product does not protect you against HIV infection (AIDS) or other sexually transmitted diseases. You can check the placement of the IUD yourself by reaching up to the top of your vagina with clean fingers to feel the threads. Do not pull on the threads. It is a good habit to check placement after each menstrual period. Call your doctor right away if you feel more of the IUD than just the threads or if you cannot feel the threads at all. The IUD may come out by itself. You may become pregnant if the device comes out. If you notice that the IUD has come out use a backup birth control method like condoms and call your health care provider. Using tampons will not change the position of the IUD and are okay to use during your period. This IUD can be safely scanned with magnetic resonance imaging (MRI) only under specific conditions. Before you have an MRI, tell your healthcare provider that you have an IUD in place, and which type of IUD you have in place. What side effects may I notice from  receiving this medicine? Side effects that you should report to your doctor or health care professional as soon as possible:  allergic reactions like skin rash, itching or hives, swelling of the face, lips, or tongue  fever, flu-like symptoms  genital sores  high blood pressure  no menstrual period for 6 weeks during use  pain, swelling, warmth in the leg  pelvic pain or tenderness  severe or sudden headache  signs of pregnancy  stomach cramping  sudden shortness of breath  trouble with balance, talking, or walking  unusual vaginal bleeding, discharge  yellowing of the eyes or skin Side effects that usually do not require medical attention (report to your doctor or health care professional if they continue or are bothersome):  acne  breast pain  change in sex drive or performance  changes in weight  cramping, dizziness, or faintness while the device is being inserted  headache  irregular menstrual bleeding within first 3 to 6 months of use  nausea This  list may not describe all possible side effects. Call your doctor for medical advice about side effects. You may report side effects to FDA at 1-800-FDA-1088. Where should I keep my medicine? This does not apply. NOTE: This sheet is a summary. It may not cover all possible information. If you have questions about this medicine, talk to your doctor, pharmacist, or health care provider.  2020 Elsevier/Gold Standard (2018-04-03 13:22:01) Norethindrone acetate (hormone replacement) What is this medicine? NORETHINDRONE ACETATE (nor eth IN drone AS e tate) is a female hormone. This medicine is used to treat endometriosis, uterine bleeding caused by abnormal hormone levels, and secondary amenorrhea. Secondary amenorrhea is when a woman stops getting menstrual periods due to low levels of certain female hormones. This medicine may be used for other purposes; ask your health care provider or pharmacist if you have  questions. COMMON BRAND NAME(S): Aygestin What should I tell my health care provider before I take this medicine? They need to know if you have any of these conditions:  blood vessel disease or blood clots  breast, cervical, or vaginal cancer  diabetes  heart disease  kidney disease  liver disease  mental depression  migraine  seizures  stroke  vaginal bleeding  an unusual or allergic reaction to norethindrone, other medicines, foods, dyes, or preservatives  pregnant or trying to get pregnant  breast-feeding How should I use this medicine? Take this medicine by mouth with a glass of water. You may take this medicine with or without food. Follow the directions on the prescription label. Take this medicine at the same time each day. Do not take your medicine more often than directed. A patient information sheet will be given with each prescription and refill. Read this sheet carefully each time. The sheet may change frequently. Talk to your pediatrician regarding the use of this medicine in children. Special care may be needed. Overdosage: If you think you have taken too much of this medicine contact a poison control center or emergency room at once. NOTE: This medicine is only for you. Do not share this medicine with others. What if I miss a dose? If you miss a dose, take it as soon as you can. If it is almost time for your next dose, take only that dose. Do not take double or extra doses. What may interact with this medicine? Do not take this medicine with any of the following medications:  amprenavir or fosamprenavir  bosentan This medicine may also interact with the following medications:  antibiotics or medicines for infections, especially rifampin, rifabutin, rifapentine, and griseofulvin, and possibly penicillins or tetracyclines  aprepitant  barbiturate medicines, such as  phenobarbital  carbamazepine  felbamate  modafinil  oxcarbazepine  phenytoin  ritonavir or other medicines for HIV infection or AIDS  St. John's wort  topiramate This list may not describe all possible interactions. Give your health care provider a list of all the medicines, herbs, non-prescription drugs, or dietary supplements you use. Also tell them if you smoke, drink alcohol, or use illegal drugs. Some items may interact with your medicine. What should I watch for while using this medicine? Visit your doctor or health care professional for regular checks on your progress. You will need a regular breast and pelvic exam and Pap smear while on this medicine. If you have any reason to think you are pregnant, stop taking this medicine right away and contact your doctor or health care professional. If you are taking this medicine for hormone related problems,  it may take several cycles of use to see improvement in your condition. What side effects may I notice from receiving this medicine? Side effects that you should report to your doctor or health care professional as soon as possible:  breast tenderness or discharge  pain in the abdomen, chest, groin or leg  severe headache  skin rash, itching, or hives  sudden shortness of breath  unusually weak or tired  vision or speech problems  yellowing of skin or eyes Side effects that usually do not require medical attention (report to your doctor or health care professional if they continue or are bothersome):  changes in sexual desire  change in menstrual flow  facial hair growth  fluid retention and swelling  headache  irritability  nausea  weight gain or loss This list may not describe all possible side effects. Call your doctor for medical advice about side effects. You may report side effects to FDA at 1-800-FDA-1088. Where should I keep my medicine? Keep out of the reach of children. Store at room  temperature between 15 and 30 degrees C (59 and 86 degrees F). Throw away any unused medicine after the expiration date. NOTE: This sheet is a summary. It may not cover all possible information. If you have questions about this medicine, talk to your doctor, pharmacist, or health care provider.  2020 Elsevier/Gold Standard (2007-12-17 14:38:36)

## 2020-01-10 ENCOUNTER — Other Ambulatory Visit: Payer: Self-pay | Admitting: Internal Medicine

## 2020-01-13 ENCOUNTER — Telehealth: Payer: Self-pay

## 2020-01-13 NOTE — Telephone Encounter (Signed)
Patient is calling to follow up with discussion from last visit. Patient states "best route would be to have a hysterectomy".

## 2020-01-13 NOTE — Telephone Encounter (Signed)
Please advise regarding patients questions regarding bladder.

## 2020-01-13 NOTE — Telephone Encounter (Signed)
Spoke with pt. Pt states wanting to give Dr Quincy Simmonds update from last visit on 01/09/20. Pt has decided to have lap hysterectomy with BSO and Bilateral Oophorectomy.   Pt would like to request to have procedure at end of August 2021 at [facility] if possible.   Pt asking if can have something done at time of surgery for bladder like a tact or sling? Advised pt will review with Dr Quincy Simmonds and return call with recommendations. Pt agreeable.   Routing to Dr Quincy Simmonds.  Cc: Hayley for benefits.  Cc: Kaitlyn for surgery planning.

## 2020-01-14 NOTE — Telephone Encounter (Signed)
I need patient to return for a separate office visit to evaluate her bladder concerns in order to plan better.

## 2020-01-14 NOTE — Telephone Encounter (Signed)
Spoke with patient. Appointment scheduled for 01/17/2020 at 3 pm with Dr.Silva. Patient is agreeable to date and time.

## 2020-01-15 NOTE — Progress Notes (Signed)
GYNECOLOGY  VISIT   HPI: 59 y.o.   Married  Caucasian  female   G2P2002 with Patient's last menstrual period was 08/20/2019.   here for bladder concerns. Patient would like to discuss potential bladder prolapse surgery if she has a hysterectomy.     Has concerns that her bladder has fallen.  Does not want to face a bladder surgery in the future after hysterectomy.   She reports she deals with various situations that contribute to her bladder concerns. She takes a diuretic, [place], and does heavy lifting at work.   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 She does have some leakage if she cannot void fast enough.  No pad use.   DF - every 30 minutes to 2 hours. NF - seldom due to fluid restriction.  Up once at night 1 - 2 times per week.  No enuresis.   She can leak if she coughs, laughs, or sneezes, but it does not occur often.   No constipation.   No fecal incontinence.   No dysuria, hematuria.  Had a few UTIs, last one she does not remember when.  She had pyelonephritis in one kidney before pandemic.  No renal stones.   Denies pelvic vaginal bulge.  No sexual difficulties due to any type of an obstruction.   Is sexually active.   GYNECOLOGIC HISTORY: Patient's last menstrual period was 08/20/2019. Contraception:  PMP Menopausal hormone therapy:  Cyclic Provera Last mammogram:  12-26-19 density b birads 1:neg Last pap smear:   06-11-2019 neg HPV HR neg        OB History    Gravida  2   Para  2   Term  2   Preterm      AB      Living  2     SAB      TAB      Ectopic      Multiple      Live Births  2              Patient Active Problem List   Diagnosis Date Noted  . Acute viral syndrome 11/27/2019  . Nausea 11/27/2019  . Hyperglycemia 09/10/2019  . Body mass index (BMI) 35.0-35.9, adult 08/22/2019  . Endometrial polyp   . Asthmatic bronchitis 01/30/2019  . Acute  infectious diarrhea 11/27/2018  . Colon cancer screening 11/14/2018  . Ganglion cyst of volar aspect of right wrist   . Allergic rhinitis 06/12/2018  . Panic attacks 02/06/2018  . Elevated glucose 02/06/2018  . Petechial rash 01/05/2018  . Epistaxis 01/05/2018  . Screening for colorectal cancer 10/24/2017  . Encounter for gynecological examination with Papanicolaou smear of cervix 10/24/2017  . Cough 06/12/2017  . Upper respiratory tract infection 06/12/2017  . Anxiety 11/14/2016  . UTI (urinary tract infection) 11/11/2015  . Diarrhea 11/11/2015  . GERD (gastroesophageal reflux disease) 10/23/2015  . BP check 09/24/2015  . Urge incontinence 08/19/2015  . Stye external 07/15/2015  . S/P cervical spinal fusion 06/05/2015  . Acute sinusitis 05/20/2015  . Fatigue 05/13/2015  . Edema 04/01/2015  . Chest pain 04/01/2015  . Nonspecific abnormal electrocardiogram (ECG) (EKG) 04/01/2015  . 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 Neck pain, bilateral posterior 12/31/2014  . Lower back pain 12/31/2014  . Yeast infection 05/07/2014  . Obesity 05/07/2014  . Night sweats 04/14/2014  . Abdominal pain 11/25/2013  .  Hormone replacement therapy (HRT) 08/21/2013  . Well adult exam 05/16/2013  . Hypertension     Past Medical History:  Diagnosis Date  . Abdominal pain 11/25/2013   Has some nausea associated with pain that has been on and off x 2 weeks will get [place]  . Anxiety    Takes Lorazepam  . Chronic bronchitis ([place])   . Endometrial polyp   . Gallstones   . GERD (gastroesophageal reflux disease)   . Hormone replacement therapy (HRT)   . Hypertension   . Night sweats 04/14/2014  . Obesity 05/07/2014  . STD (sexually transmitted disease)    HSV ?carrier--never had an outbreak but exposed from ex-husband  . Urge incontinence 08/19/2015  . Yeast infection 05/07/2014    Past Surgical History:  Procedure Laterality Date  . ANTERIOR CERVICAL DECOMP/DISCECTOMY FUSION N/A 06/05/2015    Procedure: C5-6, C6-7 Anterior Cervical Discectomy and Fusion, Allograft, Plate;  Surgeon: [name], MD;  Location: [facility];  Service: Orthopedics;  Laterality: N/A;  . ANTERIOR FUSION CERVICAL SPINE  06/05/2015   C 5 6 7    . CHOLECYSTECTOMY    . COLONOSCOPY  2015   NO POLYPS  . FOOT SURGERY Left   . GANGLION CYST EXCISION Right 07/09/2018   Procedure: EXCISION, RIGHT VOLAR WRIST GANGLION;  Surgeon: [name], MD;  Location: [facility];  Service: Orthopedics;  Laterality: Right;  . MOLE REMOVAL    . WISDOM TOOTH EXTRACTION      Current Outpatient Medications  Medication Sig Dispense Refill  . Cholecalciferol (VITAMIN D3) 5000 units TABS Take 2 tablets by mouth daily. Takes 2 daily     . cyclobenzaprine (FLEXERIL) 10 MG tablet Take 1 tablet (10 mg total) by mouth 3 (three) times daily as needed for muscle spasms. (Patient taking differently: Take 10 mg by mouth as needed for muscle spasms. ) 60 tablet 0  . diphenhydrAMINE (BENADRYL) 25 MG tablet Take 2 tablets (50 mg total) by mouth every 4 (four) hours as needed (bee sting). (Patient taking differently: Take 50 mg by mouth as needed (bee sting). ) 30 tablet 3  . ibuprofen (ADVIL) 600 MG tablet Take twice a day x 1 week, then prn pain 60 tablet 1  . LORazepam (ATIVAN) 0.5 MG tablet TAKE 1 TABLET(0.5 MG) BY MOUTH AT BEDTIME AS NEEDED 30 tablet 3  . medroxyPROGESTERone (PROVERA) 10 MG tablet Take 1 (10 mg)  tablet every day for 10 days every 2 months. 30 tablet 0  . Metoprolol Succinate 50 MG CS24 Take by mouth.    . olmesartan (BENICAR) 40 MG tablet TAKE 1 TABLET(40 MG) BY MOUTH DAILY 90 tablet 1  . omeprazole (PRILOSEC) 40 MG capsule Take 1 capsule (40 mg total) by mouth daily. 90 capsule 3  . ondansetron (ZOFRAN-ODT) 4 MG disintegrating tablet DISSOLVE 1 TABLET(4 MG) ON THE TONGUE EVERY 8 HOURS AS NEEDED FOR NAUSEA OR VOMITING 20 tablet 1  . phentermine (ADIPEX-P) 37.5 MG tablet TAKE 1 TABLET(37.5MG ) BY MOUTH DAILY BEFORE  BREAKFAST 30 tablet 2  . triamterene-hydrochlorothiazide (MAXZIDE-25) 37.5-25 MG tablet TAKE 1 TABLET BY MOUTH DAILY 90 tablet 3   No current facility-administered medications for this visit.   Facility-Administered Medications Ordered in Other Visits  Medication Dose Route Frequency Provider Last Rate Last Admin  . aminophylline injection 75 mg  75 mg Intravenous BID PRN Dorothy Spark, MD   75 mg at 04/13/15 1037     ALLERGIES: Bee venom; Biaxin [clarithromycin]; Amlodipine;  Betadine [povidone iodine]; Chloraprep one step [chlorhexidine gluconate]; Duraprep [antiseptic products, misc.]; and Keflex [cephalexin]  Family History  Problem Relation Age of Onset  . Breast cancer Mother 64       breast  . Diabetes Mother   . Fibromyalgia Mother   . Heart disease Mother        CHF  . Hypertension Mother   . Kidney disease Mother   . Heart disease Father   . Emphysema Father   . Diabetes Brother   . Hypertension Brother        x 2  . Heart disease Maternal Grandmother   . Diabetes Maternal Grandmother   . Asthma Maternal Grandmother   . Arthritis Maternal Grandmother   . Heart disease Maternal Grandfather   . Arthritis Maternal Grandfather   . Cancer Maternal Grandfather        bladder  . Breast cancer Paternal Grandmother   . Heart disease Paternal Grandmother   . Arthritis Paternal Grandmother   . Stroke Paternal Grandmother   . Ovarian cancer Paternal Grandmother   . Diabetes Paternal Grandfather   . Heart disease Paternal Grandfather   . Colon cancer Paternal Grandfather        mets  . Arthritis Paternal Grandfather   . Stomach cancer Paternal Grandfather        mets to stomach  . Hypertension Brother   . Cancer Sister        ?endometrial ca  . Breast cancer Maternal Aunt   . Breast cancer Paternal Aunt   . Esophageal cancer Neg Hx   . Pancreatic cancer Neg Hx   . Rectal cancer Neg Hx     Social History   Socioeconomic History  . Marital status: Married     Spouse name: [name]  . Number of children: 4  . Years of education: college  . Highest education level: Not on file  Occupational History  . 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 Smoking status: Never Smoker  . Smokeless tobacco: Never Used  Vaping Use  . Vaping Use: Never used  Substance and Sexual Activity  . Alcohol use: Yes    Alcohol/week: 1.0 standard drink    Types: 1 Glasses of wine per week  . Drug use: No  . Sexual activity: Yes    Birth control/protection: None, Post-menopausal  Other Topics Concern  . Not on file  Social History Narrative   Married 2 sons 2 daughters. SUPERVISOR at [employer]   One caffeinated beverage daily   This was updated 12/12/2013   Social Determinants of Health   Financial Resource Strain:   . Difficulty of Paying Living Expenses:   Food Insecurity:   . Worried About Charity fundraiser in the Last Year:   . Arboriculturist in the Last Year:   Transportation Needs:   . Film/video editor (Medical):   [name] Lack of Transportation (Non-Medical):   Physical Activity:   . Days of Exercise per Week:   . Minutes of Exercise per Session:   Stress:   . Feeling of Stress :   Social Connections:   . Frequency of Communication with Friends and Family:   . Frequency of Social Gatherings with Friends and Family:   . Attends Religious Services:   . Active Member of Clubs or Organizations:   . Attends Archivist Meetings:   [name] Marital Status:   Intimate Production manager  Violence:   . Fear of Current or Ex-Partner:   . Emotionally Abused:   [name] Physically Abused:   . Sexually Abused:     Review of Systems  Constitutional: Negative.   HENT: Negative.   Eyes: Negative.   Respiratory: Negative.   Cardiovascular: Negative.   Gastrointestinal: Negative.   Endocrine: Negative.   Genitourinary: Negative.   Musculoskeletal: Negative.   Skin: Negative.   Allergic/Immunologic: Negative.    Neurological: Negative.   Hematological: Negative.   Psychiatric/Behavioral: Negative.     PHYSICAL EXAMINATION:    BP 116/80 (BP Location: Left Arm, Patient Position: Sitting, Cuff Size: Large)   Pulse 76   Resp 14   Ht 5' 6.5" (1.689 m)   Wt 232 lb (105.2 kg)   LMP 08/20/2019   BMI 36.88 kg/m     General appearance: alert, cooperative and appears stated age  Pelvic: External genitalia:  no lesions              Urethra:  normal appearing urethra with no masses, tenderness or lesions              Bartholins and Skenes: normal                 Vagina: normal appearing vagina with normal color and discharge, no lesions.  Minimal first degree cystocele, first degree uterine prolapse, and first degree rectocele.              Cervix: no lesions                Bimanual Exam:  Uterus:  normal size, contour, position, consistency, mobility, non-tender              Adnexa: no mass, fullness, tenderness              Rectal exam: Yes.  .  Confirms.              Anus:  normal sphincter tone, no lesions  Chaperone was present for exam.  ASSESSMENT  Urinary frequency, significant.  Mild stress incontinence. Minimal uterovaginal prolapse.   PLAN  We reviewed forms of urinary incontinence - overactive bladder, stress incontinence.  Treatment of overactive bladder can be done with dietary modification, physical floor therapy, and medication.  Stress incontinence can be treated with physical floor therapy and surgical correction, I.e.midurethral sling.  Urodynamic testing described and would be recommended prior to surgical bladder care, and this was declined. No planned bladder surgery at the time of hysterectomy.  ACOG HO on urinary incontinence.

## 2020-01-17 ENCOUNTER — Other Ambulatory Visit: Payer: Self-pay

## 2020-01-17 ENCOUNTER — Encounter: Payer: Self-pay | Admitting: Obstetrics and Gynecology

## 2020-01-17 ENCOUNTER — Ambulatory Visit: Payer: Federal, State, Local not specified - PPO | Admitting: Obstetrics and Gynecology

## 2020-01-17 VITALS — BP 116/80 | HR 76 | Resp 14 | Ht 66.5 in | Wt 232.0 lb

## 2020-01-17 DIAGNOSIS — R350 Frequency of micturition: Secondary | ICD-10-CM

## 2020-01-17 DIAGNOSIS — N393 Stress incontinence (female) (male): Secondary | ICD-10-CM | POA: Diagnosis not present

## 2020-01-28 ENCOUNTER — Telehealth: Payer: Self-pay | Admitting: Obstetrics and Gynecology

## 2020-01-28 NOTE — Telephone Encounter (Signed)
Routing to [employer], Therapist, sports for surgery scheduling

## 2020-01-28 NOTE — Telephone Encounter (Signed)
Patient is calling to check on surgery scheduling.

## 2020-01-30 NOTE — Telephone Encounter (Signed)
Spoke with patient regarding surgery scheduling. Informed patient that once we have a surgery plan from Dr. Quincy Simmonds, we will proceed with precerting the surgery with her insurance. After we precert the surgery she will receive a call to review benefits and discuss surgery dates.   Patient appreciative of return call.  Dr. Quincy Simmonds, please advise on patient's surgery plan.

## 2020-01-30 NOTE — Telephone Encounter (Signed)
Patient calling to check on status of surgery scheduling.

## 2020-01-31 NOTE — Telephone Encounter (Signed)
The surgical plan will be total laparoscopic hysterectomy with bilateral salpingectomy, possible bilateral oophorectomy, cystoscopy.

## 2020-02-04 NOTE — Telephone Encounter (Signed)
Spoke with patient regarding surgery benefits. Patient acknowledges understanding of information presented. Patient is aware that benefits presented are for professional benefits only. Patient is aware that once surgery is scheduled, the hospital will call with separate benefits. Patient is aware of surgery cancellation policy.  Patient is requesting to call back after speaking with her husband. Informed patient to ask for [facility].

## 2020-02-25 ENCOUNTER — Telehealth: Payer: Self-pay

## 2020-02-25 NOTE — Telephone Encounter (Signed)
Patient would like to reschedule (03/05/20) pre-op visit due to work. Patient stated she would like to reschedule as soon as possible.

## 2020-02-25 NOTE — Telephone Encounter (Signed)
Spoke with pt. Pt states needing to change pre op visit from 03/05/20 due to change in work schedule. Pt now scheduled for pre op on 03/10/20 at 1130 am with Dr Quincy Simmonds. Pt agreeable and verbalized understanding to date and time of appt.  Encounter closed.  Cc: Kaitlyn for update

## 2020-02-25 NOTE — Telephone Encounter (Signed)
Spoke with patient. Patient would like to proceed with scheduling surgery. Surgery scheduled for 03/31/2020 at 0730 at [facility]. Pre op scheduled for 03/05/2020 at 4:30 pm with Dr.Silva. COVID test scheduled for 03/27/2020 at 9 am at [facility] location. Patient is aware of the need to quarantine after test until surgery. 1 week post op scheduled for 04/07/2020 at 4:30 pm with Dr.Silva. 6 week post op scheduled for 05/12/2020 at 4 pm with Dr.Silva. Patient is agreeable to dates and times. Surgery instructions given. Patient verbalizes understanding and will pick up packet at pre op appointment.  Routing to provider and will close encounter.

## 2020-02-26 ENCOUNTER — Other Ambulatory Visit: Payer: Self-pay | Admitting: Internal Medicine

## 2020-03-05 ENCOUNTER — Ambulatory Visit: Payer: Federal, State, Local not specified - PPO | Admitting: Obstetrics and Gynecology

## 2020-03-05 ENCOUNTER — Other Ambulatory Visit: Payer: Self-pay | Admitting: Internal Medicine

## 2020-03-05 MED ORDER — CIPROFLOXACIN HCL 250 MG PO TABS: 250.0000 mg | ORAL_TABLET | Freq: Two times a day (BID) | ORAL | 0 refills | Status: DC

## 2020-03-09 NOTE — Progress Notes (Signed)
GYNECOLOGY  VISIT   HPI: 59 y.o.   Married  Caucasian  female   G2P2002 with Patient's last menstrual period was 02/15/2020 (approximate).   here for surgical consult.    Her husband is present for the entire visit today.   Patient is interested in hysterectomy for pelvic pain and abnormal perimenopausal bleeding. She declines future childbearing.   Prior evaluation at an outside office.  Pelvic US 04/17/19 showed a 1.4 cm anterior fibroid.  Endometrial biopsy 04/22/19 showed proliferative endometrium. She has been treated with cyclic Provera.    Follow up endometrial biopsy with me 06/26/19 showed a benign endometrial polyp. Sonohysterogram 07/11/19 with me showed an EMS of 12 mm and no filling defect.  No myometrial masses were noted, and her ovaries were normal.  No free fluid was seen.  Her pelvic pain is sharp in nature and has been worse with her bleeding.  States her pain is increasing and is occurring every day.   She does have a history of colitis and was treated for Campylobacter infection in June 2020.  Her gastroenterologist, Dr. Oneida Alar, last evaluated her on 08/22/19 and assessed the pain as not gastrointestinal.  A CT scan of the abdomen and pelvis 10/22/19, ordered by her PCP, showed no acute findings in the abdomen or pelvis.  She had moderate hepatic steatosis.  She last used Vicodin more than one month ago for her pain.   She uses Advil or Tramadol prn.   The last time she had spotting was 02/15/20 following Provera use.  None since then.  Her prior spotting was in July, 2021.  Her [place] was 47.3 and her estradiol 31.1 on 06/11/19.  She has a FH of uterine cancer.   Patient states she is anxious and that her husband is her support system.  She uses Lorazepem at night for anxiety.   She has had no changes in her medical status since her last visit with me.  She did not do Covid vaccination or flu vaccine.  GYNECOLOGIC HISTORY: Patient's last menstrual period was  02/15/2020 (approximate). Contraception:  PMP Menopausal hormone therapy:  Cyclic Provera Last mammogram: 12-26-19 density b birads 1:neg Last pap smear: 06-11-2019 Neg:Neg HR HPV, 10-24-17 Neg:Neg Hr HPV, 08-19-15 Neg:Neg HR HPV        OB History    Gravida  2   Para  2   Term  2   Preterm      AB      Living  2     SAB      TAB      Ectopic      Multiple      Live Births  2              Patient Active Problem List   Diagnosis Date Noted  . Acute viral syndrome 11/27/2019  . Nausea 11/27/2019  . Hyperglycemia 09/10/2019  . Body mass index (BMI) 35.0-35.9, adult 08/22/2019  . Endometrial polyp   . Asthmatic bronchitis 01/30/2019  . Acute infectious diarrhea 11/27/2018  . Colon cancer screening 11/14/2018  . Ganglion cyst of volar aspect of right wrist   . Allergic rhinitis 06/12/2018  . Panic attacks 02/06/2018  . Elevated glucose 02/06/2018  . Petechial rash 01/05/2018  . Epistaxis 01/05/2018  . Screening for colorectal cancer 10/24/2017  . Encounter for gynecological examination with Papanicolaou smear of cervix 10/24/2017  . Cough 06/12/2017  . Upper respiratory tract infection 06/12/2017  . Anxiety 11/14/2016  . UTI (  urinary tract infection) 11/11/2015  . Diarrhea 11/11/2015  . GERD (gastroesophageal reflux disease) 10/23/2015  . BP check 09/24/2015  . Urge incontinence 08/19/2015  . Stye external 07/15/2015  . S/P cervical spinal fusion 06/05/2015  . Acute sinusitis 05/20/2015  . Fatigue 05/13/2015  . Edema 04/01/2015  . Chest pain 04/01/2015  . Nonspecific abnormal electrocardiogram (ECG) (EKG) 04/01/2015  . 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 Neck pain, bilateral posterior 12/31/2014  . Lower back pain 12/31/2014  . Yeast infection 05/07/2014  . Obesity 05/07/2014  . Night sweats 04/14/2014  . Abdominal pain 11/25/2013  . Hormone replacement therapy (HRT) 08/21/2013  . Well adult exam 05/16/2013  . Hypertension     Past Medical  History:  Diagnosis Date  . Abdominal pain 11/25/2013   Has some nausea associated with pain that has been on and off x 2 weeks will get [place]  . Anxiety    Takes Lorazepam  . Chronic bronchitis ([place])   . Endometrial polyp   . Gallstones   . GERD (gastroesophageal reflux disease)   . Hormone replacement therapy (HRT)   . Hypertension   . Night sweats 04/14/2014  . Obesity 05/07/2014  . STD (sexually transmitted disease)    HSV ?carrier--never had an outbreak but exposed from ex-husband  . Urge incontinence 08/19/2015  . Yeast infection 05/07/2014    Past Surgical History:  Procedure Laterality Date  . ANTERIOR CERVICAL DECOMP/DISCECTOMY FUSION N/A 06/05/2015   Procedure: C5-6, C6-7 Anterior Cervical Discectomy and Fusion, Allograft, Plate;  Surgeon: [name], MD;  Location: [facility];  Service: Orthopedics;  Laterality: N/A;  . ANTERIOR FUSION CERVICAL SPINE  06/05/2015   C 5 6 7    . CHOLECYSTECTOMY    . COLONOSCOPY  2015   NO POLYPS  . FOOT SURGERY Left   . GANGLION CYST EXCISION Right 07/09/2018   Procedure: EXCISION, RIGHT VOLAR WRIST GANGLION;  Surgeon: [name], MD;  Location: [facility];  Service: Orthopedics;  Laterality: Right;  . MOLE REMOVAL    . WISDOM TOOTH EXTRACTION      Current Outpatient Medications  Medication Sig Dispense Refill  . Cholecalciferol (VITAMIN D3) 5000 units TABS Take 2 tablets by mouth daily. Takes 2 daily     . cyclobenzaprine (FLEXERIL) 10 MG tablet Take 1 tablet (10 mg total) by mouth 3 (three) times daily as needed for muscle spasms. (Patient taking differently: Take 10 mg by mouth as needed for muscle spasms. ) 60 tablet 0  . diphenhydrAMINE (BENADRYL) 25 MG tablet Take 2 tablets (50 mg total) by mouth every 4 (four) hours as needed (bee sting). (Patient taking differently: Take 50 mg by mouth as needed (bee sting). ) 30 tablet 3  . HYDROcodone-acetaminophen (NORCO/VICODIN) 5-325 MG tablet Take 1 tablet by mouth every 6 (six)  hours as needed.    [name] ibuprofen (ADVIL) 600 MG tablet Take twice a day x 1 week, then prn pain 60 tablet 1  . LORazepam (ATIVAN) 0.5 MG tablet TAKE 1 TABLET(0.5 MG) BY MOUTH AT BEDTIME AS NEEDED 30 tablet 1  . medroxyPROGESTERone (PROVERA) 10 MG tablet Take 1 (10 mg)  tablet every day for 10 days every 2 months. 30 tablet 0  . metFORMIN (GLUCOPHAGE-XR) 500 MG 24 hr tablet Take 1 tablet (500 mg total) by mouth daily with breakfast. 4 tablets at hs 90 tablet 3  . Metoprolol Succinate 50 MG CS24 Take by mouth.    . olmesartan (BENICAR) 40 MG tablet  TAKE 1 TABLET(40 MG) BY MOUTH DAILY 90 tablet 1  . omeprazole (PRILOSEC) 40 MG capsule Take 1 capsule (40 mg total) by mouth daily. 90 capsule 3  . ondansetron (ZOFRAN-ODT) 4 MG disintegrating tablet DISSOLVE 1 TABLET(4 MG) ON THE TONGUE EVERY 8 HOURS AS NEEDED FOR NAUSEA OR VOMITING 20 tablet 1  . phentermine (ADIPEX-P) 37.5 MG tablet TAKE 1 TABLET(37.5MG ) BY MOUTH DAILY BEFORE BREAKFAST 90 tablet 0  . triamterene-hydrochlorothiazide (MAXZIDE-25) 37.5-25 MG tablet TAKE 1 TABLET BY MOUTH DAILY 90 tablet 3   No current facility-administered medications for this visit.   Facility-Administered Medications Ordered in Other Visits  Medication Dose Route Frequency Provider Last Rate Last Admin  . aminophylline injection 75 mg  75 mg Intravenous BID PRN Dorothy Spark, MD   75 mg at 04/13/15 1037     ALLERGIES: Bee venom; Biaxin [clarithromycin]; Amlodipine; Betadine [povidone iodine]; Chloraprep one step [chlorhexidine gluconate]; Duraprep [antiseptic products, misc.]; and Keflex [cephalexin]  Family History  Problem Relation Age of Onset  . Breast cancer Mother 16       breast  . Diabetes Mother   . Fibromyalgia Mother   . Heart disease Mother        CHF  . Hypertension Mother   . Kidney disease Mother   . Heart disease Father   . Emphysema Father   . Diabetes Brother   . Hypertension Brother        x 2  . Heart disease Maternal Grandmother    . Diabetes Maternal Grandmother   . Asthma Maternal Grandmother   . Arthritis Maternal Grandmother   . Heart disease Maternal Grandfather   . Arthritis Maternal Grandfather   . Cancer Maternal Grandfather        bladder  . Breast cancer Paternal Grandmother   . Heart disease Paternal Grandmother   . Arthritis Paternal Grandmother   . Stroke Paternal Grandmother   . Ovarian cancer Paternal Grandmother   . Diabetes Paternal Grandfather   . Heart disease Paternal Grandfather   . Colon cancer Paternal Grandfather        mets  . Arthritis Paternal Grandfather   . Stomach cancer Paternal Grandfather        mets to stomach  . Hypertension Brother   . Cancer Sister        ?endometrial ca  . Breast cancer Maternal Aunt   . Breast cancer Paternal Aunt   . Esophageal cancer Neg Hx   . Pancreatic cancer Neg Hx   . Rectal cancer Neg Hx     Social History   Socioeconomic History  . Marital status: Married    Spouse name: [name]  . Number of children: 4  . Years of education: college  . Highest education level: Not on file  Occupational History  . 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 Smoking status: Never Smoker  . Smokeless tobacco: Never Used  Vaping Use  . Vaping Use: Never used  Substance and Sexual Activity  . Alcohol use: Yes    Alcohol/week: 1.0 standard drink    Types: 1 Glasses of wine per week  . Drug use: No  . Sexual activity: Yes    Birth control/protection: None, Post-menopausal  Other Topics Concern  . Not on file  Social History Narrative   Married 2 sons 2 daughters. SUPERVISOR at [employer]   One caffeinated beverage daily   This was updated 12/12/2013  Social Determinants of Health   Financial Resource Strain:   . Difficulty of Paying Living Expenses: Not on file  Food Insecurity:   . Worried About Charity fundraiser in the Last Year: Not on file  . Ran Out of Food in the Last Year: Not on  file  Transportation Needs:   . Lack of Transportation (Medical): Not on file  . Lack of Transportation (Non-Medical): Not on file  Physical Activity:   . Days of Exercise per Week: Not on file  . Minutes of Exercise per Session: Not on file  Stress:   . Feeling of Stress : Not on file  Social Connections:   . Frequency of Communication with Friends and Family: Not on file  . Frequency of Social Gatherings with Friends and Family: Not on file  . Attends Religious Services: Not on file  . Active Member of Clubs or Organizations: Not on file  . Attends Archivist Meetings: Not on file  . Marital Status: Not on file  Intimate Partner Violence:   . Fear of Current or Ex-Partner: Not on file  . Emotionally Abused: Not on file  . Physically Abused: Not on file  . Sexually Abused: Not on file    Review of Systems  All other systems reviewed and are negative.   PHYSICAL EXAMINATION:    BP 130/82 (Cuff Size: Large)   Pulse 66   Ht 5' 6.5" (1.689 m)   Wt 224 lb (101.6 kg)   LMP 02/15/2020 (Approximate)   BMI 35.61 kg/m     General appearance: alert, cooperative and appears stated age Head: Normocephalic, without obvious abnormality, atraumatic Neck: no adenopathy, supple, symmetrical, trachea midline and thyroid normal to inspection and palpation Lungs: clear to auscultation bilaterally Heart: regular rate and rhythm Abdomen: soft, non-tender, no masses,  no organomegaly Extremities: extremities normal, atraumatic, no cyanosis or edema Skin: Skin color, texture, turgor normal. No rashes or lesions No abnormal inguinal nodes palpated Neurologic: Grossly normal  Pelvic: External genitalia:  no lesions              Urethra:  normal appearing urethra with no masses, tenderness or lesions              Bartholins and Skenes: normal                 Vagina: normal appearing vagina with normal color and discharge, no lesions              Cervix: no lesions                 Bimanual Exam:  Uterus:  normal size, contour, position, consistency, mobility, non-tender              Adnexa: no mass, fullness, tenderness           Chaperone was present for exam.  ASSESSMENT  Abnormal perimenopausal bleeding.  Pelvic pain.  Small fibroid on prior US.  FH uterine cancer. Allergies to multiple skin sterilizing preps for surgery. Anxiety.  HTN.  Diabetes.  On Metformin XR. On Phentermine.  PLAN  I discussed total laparoscopic hysterectomy with bilateral salpingo-oophorectomy, and cystoscopy.    I reviewed risks, benefits, and alternatives.  Risks include but are not limited to bleeding, infection, damage to surrounding organs, reaction to anesthesia, DVT, PE, death, vaginal cuff dehiscence, continued pain, need to convert to a traditional laparotomy incision to complete the procedure, and menopausal symptoms with removal of the ovaries.  We discussed menopausal symptoms of hot flashes and decreased libido, and vaginal dryness.   She will receive Lovenox for DVT/PE prophylaxis.  She will stop her phentermine now.  Surgical expectations and recovery discussed.   Questions invited and answered.  Patient wishes to proceed.   She will see her PCP for anxiety treatment.

## 2020-03-10 ENCOUNTER — Ambulatory Visit (INDEPENDENT_AMBULATORY_CARE_PROVIDER_SITE_OTHER): Payer: Federal, State, Local not specified - PPO | Admitting: Obstetrics and Gynecology

## 2020-03-10 ENCOUNTER — Other Ambulatory Visit: Payer: Self-pay

## 2020-03-10 ENCOUNTER — Telehealth: Payer: Self-pay | Admitting: Obstetrics and Gynecology

## 2020-03-10 ENCOUNTER — Encounter: Payer: Self-pay | Admitting: Obstetrics and Gynecology

## 2020-03-10 ENCOUNTER — Encounter: Payer: Self-pay | Admitting: Internal Medicine

## 2020-03-10 ENCOUNTER — Ambulatory Visit (INDEPENDENT_AMBULATORY_CARE_PROVIDER_SITE_OTHER): Payer: Federal, State, Local not specified - PPO | Admitting: Internal Medicine

## 2020-03-10 VITALS — BP 130/82 | HR 66 | Ht 66.5 in | Wt 224.0 lb

## 2020-03-10 DIAGNOSIS — E6609 Other obesity due to excess calories: Secondary | ICD-10-CM

## 2020-03-10 DIAGNOSIS — N924 Excessive bleeding in the premenopausal period: Secondary | ICD-10-CM

## 2020-03-10 DIAGNOSIS — Z0289 Encounter for other administrative examinations: Secondary | ICD-10-CM

## 2020-03-10 DIAGNOSIS — F419 Anxiety disorder, unspecified: Secondary | ICD-10-CM | POA: Diagnosis not present

## 2020-03-10 DIAGNOSIS — I10 Essential (primary) hypertension: Secondary | ICD-10-CM

## 2020-03-10 DIAGNOSIS — F410 Panic disorder [episodic paroxysmal anxiety] without agoraphobia: Secondary | ICD-10-CM | POA: Diagnosis not present

## 2020-03-10 DIAGNOSIS — R739 Hyperglycemia, unspecified: Secondary | ICD-10-CM

## 2020-03-10 DIAGNOSIS — R102 Pelvic and perineal pain: Secondary | ICD-10-CM | POA: Diagnosis not present

## 2020-03-10 DIAGNOSIS — Z6831 Body mass index (BMI) 31.0-31.9, adult: Secondary | ICD-10-CM

## 2020-03-10 LAB — HEMOGLOBIN A1C: Hgb A1c MFr Bld: 6.8 % — ABNORMAL HIGH (ref 4.6–6.5)

## 2020-03-10 LAB — COMPREHENSIVE METABOLIC PANEL
ALT: 33 U/L (ref 0–35)
AST: 23 U/L (ref 0–37)
Albumin: 4.3 g/dL (ref 3.5–5.2)
Alkaline Phosphatase: 88 U/L (ref 39–117)
BUN: 26 mg/dL — ABNORMAL HIGH (ref 6–23)
CO2: 29 mEq/L (ref 19–32)
Calcium: 9.8 mg/dL (ref 8.4–10.5)
Chloride: 103 mEq/L (ref 96–112)
Creatinine, Ser: 1.21 mg/dL — ABNORMAL HIGH (ref 0.40–1.20)
GFR: 49.66 mL/min — ABNORMAL LOW (ref >60.00)
Glucose, Bld: 87 mg/dL (ref 70–99)
Potassium: 4.8 mEq/L (ref 3.5–5.1)
Sodium: 139 mEq/L (ref 135–145)
Total Bilirubin: 0.5 mg/dL (ref 0.2–1.2)
Total Protein: 7.7 g/dL (ref 6.0–8.3)

## 2020-03-10 MED ORDER — METFORMIN HCL ER 500 MG PO TB24: 500.0000 mg | ORAL_TABLET | Freq: Every day | ORAL | 3 refills | Status: DC

## 2020-03-10 MED ORDER — PHENTERMINE HCL 37.5 MG PO TABS: ORAL_TABLET | ORAL | 0 refills | Status: DC

## 2020-03-10 MED ORDER — ONDANSETRON 4 MG PO TBDP: ORAL_TABLET | ORAL | 1 refills | Status: DC

## 2020-03-10 NOTE — Progress Notes (Signed)
Subjective:  Patient ID: Caroline Wong, female    DOB: March 06, 1961  Age: 59 y.o. MRN: [id number]  CC: No chief complaint on file.   HPI Caroline Wong presents for obesity C/o pelvic pains - seeing dr Quincy Simmonds Pt refused COVID 19 shot   Outpatient Medications Prior to Visit  Medication Sig Dispense Refill  . Cholecalciferol (VITAMIN D3) 5000 units TABS Take 2 tablets by mouth daily. Takes 2 daily     . ciprofloxacin (CIPRO) 250 MG tablet Take 1 tablet (250 mg total) by mouth 2 (two) times daily. 8 tablet 0  . cyclobenzaprine (FLEXERIL) 10 MG tablet Take 1 tablet (10 mg total) by mouth 3 (three) times daily as needed for muscle spasms. (Patient taking differently: Take 10 mg by mouth as needed for muscle spasms. ) 60 tablet 0  . diphenhydrAMINE (BENADRYL) 25 MG tablet Take 2 tablets (50 mg total) by mouth every 4 (four) hours as needed (bee sting). (Patient taking differently: Take 50 mg by mouth as needed (bee sting). ) 30 tablet 3  . ibuprofen (ADVIL) 600 MG tablet Take twice a day x 1 week, then prn pain 60 tablet 1  . LORazepam (ATIVAN) 0.5 MG tablet TAKE 1 TABLET(0.5 MG) BY MOUTH AT BEDTIME AS NEEDED 30 tablet 1  . medroxyPROGESTERone (PROVERA) 10 MG tablet Take 1 (10 mg)  tablet every day for 10 days every 2 months. 30 tablet 0  . Metoprolol Succinate 50 MG CS24 Take by mouth.    . olmesartan (BENICAR) 40 MG tablet TAKE 1 TABLET(40 MG) BY MOUTH DAILY 90 tablet 1  . omeprazole (PRILOSEC) 40 MG capsule Take 1 capsule (40 mg total) by mouth daily. 90 capsule 3  . ondansetron (ZOFRAN-ODT) 4 MG disintegrating tablet DISSOLVE 1 TABLET(4 MG) ON THE TONGUE EVERY 8 HOURS AS NEEDED FOR NAUSEA OR VOMITING 20 tablet 1  . phentermine (ADIPEX-P) 37.5 MG tablet TAKE 1 TABLET(37.5MG ) BY MOUTH DAILY BEFORE BREAKFAST 30 tablet 2  . triamterene-hydrochlorothiazide (MAXZIDE-25) 37.5-25 MG tablet TAKE 1 TABLET BY MOUTH DAILY 90 tablet 3   Facility-Administered Medications Prior to Visit  Medication  Dose Route Frequency Provider Last Rate Last Admin  . aminophylline injection 75 mg  75 mg Intravenous BID PRN Dorothy Spark, MD   75 mg at 04/13/15 1037    ROS: Review of Systems  Constitutional: Negative for activity change, appetite change, chills, fatigue and unexpected weight change.  HENT: Negative for congestion, mouth sores and sinus pressure.   Eyes: Negative for visual disturbance.  Respiratory: Negative for cough and chest tightness.   Gastrointestinal: Positive for abdominal pain. Negative for nausea.  Genitourinary: Negative for difficulty urinating, frequency and vaginal pain.  Musculoskeletal: Positive for arthralgias. Negative for back pain and gait problem.  Skin: Negative for pallor and rash.  Neurological: Negative for dizziness, tremors, weakness, numbness and headaches.  Psychiatric/Behavioral: Negative for confusion and sleep disturbance.    Objective:  BP 122/80 (BP Location: Left Arm, Patient Position: Sitting, Cuff Size: Large)   Pulse 80   Temp 98.2 F (36.8 C) (Oral)   Ht 5' 6.5" (1.689 m)   Wt 224 lb (101.6 kg)   LMP 08/20/2019   SpO2 97%   BMI 35.61 kg/m   BP Readings from Last 3 Encounters:  03/10/20 122/80  01/17/20 116/80  01/09/20 136/82    Wt Readings from Last 3 Encounters:  03/10/20 224 lb (101.6 kg)  01/17/20 232 lb (105.2 kg)  01/09/20 236 lb 9.6 oz (107.3  kg)    Physical Exam Constitutional:      General: She is not in acute distress.    Appearance: She is well-developed. She is obese.  HENT:     Head: Normocephalic.     Right Ear: External ear normal.     Left Ear: External ear normal.     Nose: Nose normal.  Eyes:     General:        Right eye: No discharge.        Left eye: No discharge.     Conjunctiva/sclera: Conjunctivae normal.     Pupils: Pupils are equal, round, and reactive to light.  Neck:     Thyroid: No thyromegaly.     Vascular: No JVD.     Trachea: No tracheal deviation.  Cardiovascular:     Rate  and Rhythm: Normal rate and regular rhythm.     Heart sounds: Normal heart sounds.  Pulmonary:     Effort: No respiratory distress.     Breath sounds: No stridor. No wheezing or rales.  Abdominal:     General: Bowel sounds are normal. There is no distension.     Palpations: Abdomen is soft. There is no mass.     Tenderness: There is no abdominal tenderness. There is no guarding or rebound.  Musculoskeletal:        General: No tenderness.     Cervical back: Normal range of motion and neck supple.  Lymphadenopathy:     Cervical: No cervical adenopathy.  Skin:    Findings: No erythema or rash.  Neurological:     Cranial Nerves: No cranial nerve deficit.     Motor: No abnormal muscle tone.     Coordination: Coordination normal.     Deep Tendon Reflexes: Reflexes normal.  Psychiatric:        Behavior: Behavior normal.        Thought Content: Thought content normal.        Judgment: Judgment normal.    Wt Readings from Last 3 Encounters:  03/10/20 224 lb (101.6 kg)  01/17/20 232 lb (105.2 kg)  01/09/20 236 lb 9.6 oz (107.3 kg)    Lab Results  Component Value Date   WBC 7.9 09/10/2019   HGB 14.1 09/10/2019   HCT 42.2 09/10/2019   PLT 267.0 09/10/2019   GLUCOSE 93 10/08/2019   CHOL 165 03/20/2019   TRIG 148.0 03/20/2019   HDL 47.90 03/20/2019   LDLCALC 88 03/20/2019   ALT 33 09/10/2019   AST 23 09/10/2019   NA 137 10/08/2019   K 4.0 10/08/2019   CL 102 10/08/2019   CREATININE 1.14 10/08/2019   BUN 25 (H) 10/08/2019   CO2 29 10/08/2019   TSH 0.95 09/10/2019   INR 0.9 02/06/2018   HGBA1C 6.7 (H) 09/10/2019    MM 3D SCREEN BREAST BILATERAL  Result Date: 12/27/2019 CLINICAL DATA:  Screening. EXAM: DIGITAL SCREENING BILATERAL MAMMOGRAM WITH TOMO AND CAD COMPARISON:  Previous exam(s). ACR Breast Density Category b: There are scattered areas of fibroglandular density. FINDINGS: There are no findings suspicious for malignancy. Images were processed with CAD. IMPRESSION: No  mammographic evidence of malignancy. A result letter of this screening mammogram will be mailed directly to the patient. RECOMMENDATION: Screening mammogram in one year. (Code:SM-B-01Y) BI-RADS CATEGORY  1: Negative. Electronically Signed   By: Valentino Saxon MD   On: 12/27/2019 16:27    Assessment & Plan:    Walker Kehr, MD

## 2020-03-10 NOTE — Telephone Encounter (Signed)
Spoke with patient who is okay with proceeding with seeing Dr.Plotnikov regarding anxiety. Call to [place] office who state that the patient has sent a MyChart message regarding her anxiety and recommendations. Dr.Plotnikov's office will contact the patient to discuss options and plan of care.  Routing to provider and will close encounter.

## 2020-03-10 NOTE — Assessment & Plan Note (Addendum)
Restart metformin Weight loss

## 2020-03-10 NOTE — Assessment & Plan Note (Signed)
BP Readings from Last 3 Encounters:  03/10/20 122/80  01/17/20 116/80  01/09/20 136/82  On Rx

## 2020-03-10 NOTE — Telephone Encounter (Signed)
Please assist in making an appointment for patient to see her PCP Dr. Alain Marion for treatment of her anxiety in preparation for surgery.   She is asking to have her husband present for her preop visit and her preop preparation for surgery when needles are involved.   She currently intends to proceed forward with surgery but is having anxiety about the process.

## 2020-03-11 ENCOUNTER — Telehealth: Payer: Self-pay | Admitting: Obstetrics and Gynecology

## 2020-03-11 NOTE — Telephone Encounter (Signed)
Please contact [place] to be sure that [facility] scrub is available for the patient's upcoming surgical prep.  She is allergic to Chloraprep, Duraprep and betadine.

## 2020-03-12 NOTE — Telephone Encounter (Signed)
Reviewed with [place]. [facility] will be available.  A note has been placed on the case so that the OR team will be aware.  Routing to provider and will close encounter.

## 2020-03-13 NOTE — Assessment & Plan Note (Signed)
Lorazepam as needed  Potential benefits of a long term benzodiazepines  use as well as potential risks  and complications were explained to the patient and were aknowledged.

## 2020-03-13 NOTE — Assessment & Plan Note (Signed)
Continue with phentermine  Potential benefits of a long term phentermine use as well as potential risks  and complications were explained to the patient and were aknowledged.

## 2020-03-19 NOTE — Progress Notes (Signed)
Pt. Needs orders for upcoming surgery.PST and lab. appointment on: 03/23/20.Thanks.

## 2020-03-19 NOTE — Patient Instructions (Addendum)
DUE TO COVID-19 ONLY ONE VISITOR IS ALLOWED IN WAITING ROOM (VISITOR WILL HAVE A TEMPERATURE CHECK ON ARRIVAL AND MUST WEAR A FACE MASK THE ENTIRE TIME.)  ONCE YOU ARE ADMITTED TO YOUR PRIVATE ROOM, THE SAME ONE VISITOR IS ALLOWED TO VISIT DURING VISITING HOURS ONLY.  Your COVID swab testing is scheduled for: 03/27/20 at  9:00 am , You must self quarantine after your testing per handout given to you at the testing site. [address] [place], [place] [address]  (Must self quarantine after testing. Follow instructions on handout.)       Your procedure is scheduled on: 03/31/20  Report to [facility] AT: 5:30 A. M.   Call this number if you have problems the morning of [phone].   OUR ADDRESS IS [address].  WE ARE LOCATED IN THE [place]                                   [facility].                                     REMEMBER:   DO NOT EAT FOOD OR DRINK LIQUIDS AFTER MIDNIGHT .    BRUSH YOUR TEETH THE MORNING OF SURGERY.  TAKE THESE MEDICATIONS MORNING OF SURGERY WITH A SIP OF WATER: metoprolol,omeprazole.   DO NOT WEAR JEWERLY, MAKE UP, OR NAIL POLISH.  DO NOT WEAR LOTIONS, POWDERS, PERFUMES/COLOGNE OR DEODORANT.  DO NOT SHAVE FOR 24 HOURS PRIOR TO DAY OF SURGERY.  CONTACTS, GLASSES, OR DENTURES MAY NOT BE WORN TO SURGERY.                                    [facility] IS NOT RESPONSIBLE  FOR ANY BELONGINGS.        YOU MAY BRING A SMALL OVERNIGHT [facility] - Preparing for Surgery Before surgery, you can play an important role.  Because skin is not sterile, your skin needs to be as free of germs as possible.  You can reduce the number of germs on your skin by washing with CHG (chlorahexidine gluconate) soap before surgery.  CHG is an antiseptic cleaner which kills germs and bonds with the skin to continue killing germs even after washing. Please DO NOT use if you  have an allergy to CHG or antibacterial soaps.  If your skin becomes reddened/irritated stop using the CHG and inform your nurse when you arrive at Short Stay. Do not shave (including legs and underarms) for at least 48 hours prior to the first CHG shower.  You may shave your face/neck. Please follow these instructions carefully:  1.  Shower with CHG Soap the night before surgery and the  morning of Surgery.  2.  If you choose to wash your hair, wash your hair first as usual with your  normal  shampoo.  3.  After you shampoo, rinse your hair and body thoroughly to remove the  shampoo.                           4.  Use CHG as you would any other liquid soap.  You can apply chg directly  to the skin and wash                       Gently with a scrungie or clean washcloth.  5.  Apply the CHG Soap to your body ONLY FROM THE NECK DOWN.   Do not use on face/ open                           Wound or open sores. Avoid contact with eyes, ears mouth and genitals (private parts).                       Wash face,  Genitals (private parts) with your normal soap.             6.  Wash thoroughly, paying special attention to the area where your surgery  will be performed.  7.  Thoroughly rinse your body with warm water from the neck down.  8.  DO NOT shower/wash with your normal soap after using and rinsing off  the CHG Soap.                9.  Pat yourself dry with a clean towel.            10.  Wear clean pajamas.            11.  Place clean sheets on your bed the night of your first shower and do not  sleep with pets. Day of Surgery : Do not apply any lotions/deodorants the morning of surgery.  Please wear clean clothes to the hospital/surgery center.  FAILURE TO FOLLOW THESE INSTRUCTIONS MAY RESULT IN THE CANCELLATION OF YOUR SURGERY PATIENT SIGNATURE_________________________________  NURSE  SIGNATURE__________________________________  ________________________________________________________________________

## 2020-03-23 ENCOUNTER — Other Ambulatory Visit: Payer: Self-pay

## 2020-03-23 ENCOUNTER — Encounter (HOSPITAL_COMMUNITY): Payer: Self-pay

## 2020-03-23 ENCOUNTER — Encounter (HOSPITAL_COMMUNITY)
Admission: RE | Admit: 2020-03-23 | Discharge: 2020-03-23 | Disposition: A | Discharge status: Home / Self Care | Payer: Federal, State, Local not specified - PPO | Source: Ambulatory Visit | Attending: Obstetrics and Gynecology | Admitting: Obstetrics and Gynecology

## 2020-03-23 DIAGNOSIS — Z01818 Encounter for other preprocedural examination: Secondary | ICD-10-CM | POA: Diagnosis not present

## 2020-03-23 HISTORY — DX: Malignant (primary) neoplasm, unspecified: C80.1

## 2020-03-23 LAB — BASIC METABOLIC PANEL
Anion gap: 10 (ref 5–15)
BUN: 23 mg/dL — ABNORMAL HIGH (ref 6–20)
CO2: 28 mmol/L (ref 22–32)
Calcium: 9.5 mg/dL (ref 8.9–10.3)
Chloride: 101 mmol/L (ref 98–111)
Creatinine, Ser: 1.02 mg/dL — ABNORMAL HIGH (ref 0.44–1.00)
GFR, Estimated: >60 mL/min (ref >60)
Glucose, Bld: 138 mg/dL — ABNORMAL HIGH (ref 70–99)
Potassium: 4.4 mmol/L (ref 3.5–5.1)
Sodium: 139 mmol/L (ref 135–145)

## 2020-03-23 LAB — CBC
HCT: 42.6 % (ref 36.0–46.0)
Hemoglobin: 13.9 g/dL (ref 12.0–15.0)
MCH: 30.7 pg (ref 26.0–34.0)
MCHC: 32.6 g/dL (ref 30.0–36.0)
MCV: 94 fL (ref 80.0–100.0)
Platelets: 292 10*3/uL (ref 150–400)
RBC: 4.53 MIL/uL (ref 3.87–5.11)
RDW: 12 % (ref 11.5–15.5)
WBC: 9.2 10*3/uL (ref 4.0–10.5)
nRBC: 0 % (ref 0.0–0.2)

## 2020-03-23 LAB — GLUCOSE, CAPILLARY: Glucose-Capillary: 156 mg/dL — ABNORMAL HIGH (ref 70–99)

## 2020-03-23 NOTE — Progress Notes (Signed)
COVID Vaccine Completed: Pt. Refused to answer. Date COVID Vaccine completed: COVID vaccine manufacturer: [employer]    [employer]   PCP - Dr. [name] Plotnikov. LOV: 03/10/20 Cardiologist -   Chest x-ray -  EKG -  Stress Test -  ECHO -  Cardiac Cath -  Pacemaker/ICD device last checked: A1-C: 6.8: 03/10/20 EPIC Sleep Study -  CPAP -   Fasting Blood Sugar -  Checks Blood Sugar _____ times a day  Blood Thinner Instructions: Aspirin Instructions: Last Dose:  Anesthesia review: Hx: HTN,DIA. Blood pressure: 191/103,183/112.   Patient denies shortness of breath, fever, cough and chest pain at PAT appointment   Patient verbalized understanding of instructions that were given to them at the PAT appointment. Patient was also instructed that they will need to review over the PAT instructions again at home before surgery.

## 2020-03-27 ENCOUNTER — Other Ambulatory Visit (HOSPITAL_COMMUNITY)
Admission: RE | Admit: 2020-03-27 | Discharge: 2020-03-27 | Disposition: A | Discharge status: Home / Self Care | Payer: Federal, State, Local not specified - PPO | Source: Ambulatory Visit | Attending: Obstetrics and Gynecology | Admitting: Obstetrics and Gynecology

## 2020-03-27 DIAGNOSIS — Z01812 Encounter for preprocedural laboratory examination: Secondary | ICD-10-CM | POA: Insufficient documentation

## 2020-03-27 DIAGNOSIS — Z20822 Contact with and (suspected) exposure to covid-19: Secondary | ICD-10-CM | POA: Diagnosis not present

## 2020-03-27 LAB — SARS CORONAVIRUS 2 (TAT 6-24 HRS): SARS Coronavirus 2: NEGATIVE

## 2020-03-30 NOTE — Anesthesia Preprocedure Evaluation (Addendum)
Anesthesia Evaluation  Patient identified by MRN, date of birth, ID band Patient awake  General Assessment Comment:Very anxious  Reviewed: Allergy & Precautions, NPO status , Patient's Chart, lab work & pertinent test results  Airway Mallampati: II  TM Distance: >3 FB Neck ROM: Full    Dental no notable dental hx. (+) Teeth Intact, Dental Advisory Given   Pulmonary asthma ,    Pulmonary exam normal breath sounds clear to auscultation       Cardiovascular Exercise Tolerance: Good hypertension, Pt. on home beta blockers and Pt. on medications Normal cardiovascular exam Rhythm:Regular Rate:Normal  10/18 EKG SR  35 w LVH   Neuro/Psych Anxiety S/P C6-7 ant cvervical discectomy 2016 negative neurological ROS     GI/Hepatic Neg liver ROS, GERD  ,  Endo/Other  diabetes, Type 2, Oral Hypoglycemic AgentsA1c 6.8  Renal/GU negative Renal ROSK+ 4.4 Cr 1.02  negative genitourinary   Musculoskeletal negative musculoskeletal ROS (+)   Abdominal (+) + obese,   Peds  Hematology Hgb 13.9 Plt 292  T&S available   Anesthesia Other Findings All : Keflex, Amlodipine, betadine, biaxin, duraprep  Reproductive/Obstetrics negative OB ROS                            Anesthesia Physical Anesthesia Plan  ASA: III  Anesthesia Plan: General   Post-op Pain Management:    Induction: Intravenous  PONV Risk Score and Plan: 4 or greater and Treatment may vary due to age or medical condition, Ondansetron, Dexamethasone, Midazolam and Scopolamine patch - Pre-op  Airway Management Planned: Oral ETT  Additional Equipment: None  Intra-op Plan:   Post-operative Plan:   Informed Consent: I have reviewed the patients History and Physical, chart, labs and discussed the procedure including the risks, benefits and alternatives for the proposed anesthesia with the patient or authorized representative who has indicated  his/her understanding and acceptance.     Dental advisory given  Plan Discussed with: CRNA and Anesthesiologist  Anesthesia Plan Comments: (GA w ETT + lidocaine drip )       Anesthesia Quick Evaluation

## 2020-03-31 ENCOUNTER — Observation Stay (HOSPITAL_BASED_OUTPATIENT_CLINIC_OR_DEPARTMENT_OTHER): Payer: Federal, State, Local not specified - PPO | Admitting: Anesthesiology

## 2020-03-31 ENCOUNTER — Observation Stay (HOSPITAL_BASED_OUTPATIENT_CLINIC_OR_DEPARTMENT_OTHER)
Admission: RE | Admit: 2020-03-31 | Discharge: 2020-04-01 | Disposition: A | Discharge status: Home / Self Care | Payer: Federal, State, Local not specified - PPO | Attending: Obstetrics and Gynecology | Admitting: Obstetrics and Gynecology

## 2020-03-31 ENCOUNTER — Encounter (HOSPITAL_BASED_OUTPATIENT_CLINIC_OR_DEPARTMENT_OTHER)
Admission: RE | Disposition: A | Discharge status: Home / Self Care | Payer: Self-pay | Source: Home / Self Care | Attending: Obstetrics and Gynecology

## 2020-03-31 ENCOUNTER — Encounter (HOSPITAL_BASED_OUTPATIENT_CLINIC_OR_DEPARTMENT_OTHER): Payer: Self-pay | Admitting: Obstetrics and Gynecology

## 2020-03-31 DIAGNOSIS — Z79899 Other long term (current) drug therapy: Secondary | ICD-10-CM | POA: Insufficient documentation

## 2020-03-31 DIAGNOSIS — N959 Unspecified menopausal and perimenopausal disorder: Secondary | ICD-10-CM | POA: Diagnosis not present

## 2020-03-31 DIAGNOSIS — R102 Pelvic and perineal pain: Secondary | ICD-10-CM | POA: Diagnosis not present

## 2020-03-31 DIAGNOSIS — E119 Type 2 diabetes mellitus without complications: Secondary | ICD-10-CM | POA: Insufficient documentation

## 2020-03-31 DIAGNOSIS — Z9889 Other specified postprocedural states: Secondary | ICD-10-CM

## 2020-03-31 DIAGNOSIS — N800 Endometriosis of uterus: Secondary | ICD-10-CM | POA: Diagnosis not present

## 2020-03-31 DIAGNOSIS — N924 Excessive bleeding in the premenopausal period: Secondary | ICD-10-CM | POA: Diagnosis not present

## 2020-03-31 DIAGNOSIS — N888 Other specified noninflammatory disorders of cervix uteri: Secondary | ICD-10-CM | POA: Diagnosis not present

## 2020-03-31 DIAGNOSIS — Z90710 Acquired absence of both cervix and uterus: Secondary | ICD-10-CM | POA: Diagnosis present

## 2020-03-31 DIAGNOSIS — Z7984 Long term (current) use of oral hypoglycemic drugs: Secondary | ICD-10-CM | POA: Insufficient documentation

## 2020-03-31 DIAGNOSIS — I10 Essential (primary) hypertension: Secondary | ICD-10-CM | POA: Insufficient documentation

## 2020-03-31 DIAGNOSIS — N840 Polyp of corpus uteri: Secondary | ICD-10-CM | POA: Diagnosis not present

## 2020-03-31 DIAGNOSIS — E1165 Type 2 diabetes mellitus with hyperglycemia: Secondary | ICD-10-CM | POA: Diagnosis not present

## 2020-03-31 HISTORY — PX: CYSTOSCOPY: SHX5120

## 2020-03-31 HISTORY — PX: TOTAL LAPAROSCOPIC HYSTERECTOMY WITH BILATERAL SALPINGO OOPHORECTOMY: SHX6845

## 2020-03-31 LAB — CBC
HCT: 40 % (ref 36.0–46.0)
Hemoglobin: 13.1 g/dL (ref 12.0–15.0)
MCH: 31.1 pg (ref 26.0–34.0)
MCHC: 32.8 g/dL (ref 30.0–36.0)
MCV: 95 fL (ref 80.0–100.0)
Platelets: 242 10*3/uL (ref 150–400)
RBC: 4.21 MIL/uL (ref 3.87–5.11)
RDW: 12 % (ref 11.5–15.5)
WBC: 11 10*3/uL — ABNORMAL HIGH (ref 4.0–10.5)
nRBC: 0 % (ref 0.0–0.2)

## 2020-03-31 LAB — TYPE AND SCREEN
ABO/RH(D): A NEG
Antibody Screen: NEGATIVE

## 2020-03-31 LAB — GLUCOSE, CAPILLARY
Glucose-Capillary: 119 mg/dL — ABNORMAL HIGH (ref 70–99)
Glucose-Capillary: 117 mg/dL — ABNORMAL HIGH (ref 70–99)

## 2020-03-31 LAB — BASIC METABOLIC PANEL
Anion gap: 13 (ref 5–15)
BUN: 29 mg/dL — ABNORMAL HIGH (ref 6–20)
CO2: 24 mmol/L (ref 22–32)
Calcium: 8.8 mg/dL — ABNORMAL LOW (ref 8.9–10.3)
Chloride: 101 mmol/L (ref 98–111)
Creatinine, Ser: 1.21 mg/dL — ABNORMAL HIGH (ref 0.44–1.00)
GFR, Estimated: 53 mL/min — ABNORMAL LOW (ref >60)
Glucose, Bld: 249 mg/dL — ABNORMAL HIGH (ref 70–99)
Potassium: 4.6 mmol/L (ref 3.5–5.1)
Sodium: 138 mmol/L (ref 135–145)

## 2020-03-31 LAB — ABO/RH: ABO/RH(D): A NEG

## 2020-03-31 LAB — POCT PREGNANCY, URINE: Preg Test, Ur: NEGATIVE

## 2020-03-31 SURGERY — HYSTERECTOMY, TOTAL, LAPAROSCOPIC, WITH BILATERAL SALPINGO-OOPHORECTOMY
Anesthesia: General

## 2020-03-31 MED ORDER — PANTOPRAZOLE SODIUM 40 MG PO TBEC
DELAYED_RELEASE_TABLET | ORAL | Status: AC
  Filled 2020-03-31: qty 1

## 2020-03-31 MED ORDER — KETOROLAC TROMETHAMINE 15 MG/ML IJ SOLN
15.0000 mg | Freq: Four times a day (QID) | INTRAMUSCULAR | Status: DC
  Administered 2020-03-31 – 2020-04-01 (×3): 15 mg via INTRAVENOUS

## 2020-03-31 MED ORDER — PROPOFOL 10 MG/ML IV BOLUS
INTRAVENOUS | Status: AC
  Filled 2020-03-31: qty 40

## 2020-03-31 MED ORDER — TRIAMTERENE-HCTZ 37.5-25 MG PO TABS
1.0000 | ORAL_TABLET | Freq: Every day | ORAL | Status: DC
  Filled 2020-03-31: qty 1

## 2020-03-31 MED ORDER — ONDANSETRON HCL 4 MG/2ML IJ SOLN
INTRAMUSCULAR | Status: AC
  Filled 2020-03-31: qty 2

## 2020-03-31 MED ORDER — METFORMIN HCL ER 500 MG PO TB24
500.0000 mg | ORAL_TABLET | Freq: Two times a day (BID) | ORAL | Status: DC
  Administered 2020-03-31: 500 mg via ORAL
  Filled 2020-03-31: qty 1

## 2020-03-31 MED ORDER — PANTOPRAZOLE SODIUM 40 MG PO TBEC
80.0000 mg | DELAYED_RELEASE_TABLET | Freq: Every day | ORAL | Status: DC
  Administered 2020-03-31: 80 mg via ORAL

## 2020-03-31 MED ORDER — SODIUM CHLORIDE 0.9 % IR SOLN
Status: DC | PRN
  Administered 2020-03-31: 1000 mL

## 2020-03-31 MED ORDER — OXYCODONE-ACETAMINOPHEN 5-325 MG PO TABS
ORAL_TABLET | ORAL | Status: AC
  Filled 2020-03-31: qty 1

## 2020-03-31 MED ORDER — KETOROLAC TROMETHAMINE 30 MG/ML IJ SOLN
INTRAMUSCULAR | Status: AC
  Filled 2020-03-31: qty 1

## 2020-03-31 MED ORDER — AMISULPRIDE (ANTIEMETIC) 5 MG/2ML IV SOLN: 10.0000 mg | Freq: Once | INTRAVENOUS | Status: DC | PRN

## 2020-03-31 MED ORDER — OXYCODONE HCL 5 MG/5ML PO SOLN: 5.0000 mg | Freq: Once | ORAL | Status: DC | PRN

## 2020-03-31 MED ORDER — EPHEDRINE SULFATE 50 MG/ML IJ SOLN
INTRAMUSCULAR | Status: DC | PRN
  Administered 2020-03-31 (×2): 10 mg via INTRAVENOUS

## 2020-03-31 MED ORDER — ORAL CARE MOUTH RINSE: 15.0000 mL | Freq: Once | OROMUCOSAL | Status: DC

## 2020-03-31 MED ORDER — ONDANSETRON HCL 4 MG/2ML IJ SOLN
INTRAMUSCULAR | Status: DC | PRN
  Administered 2020-03-31: 8 mg via INTRAVENOUS

## 2020-03-31 MED ORDER — ONDANSETRON HCL 4 MG/2ML IJ SOLN: 4.0000 mg | Freq: Once | INTRAMUSCULAR | Status: DC | PRN

## 2020-03-31 MED ORDER — PROPOFOL 10 MG/ML IV BOLUS
INTRAVENOUS | Status: DC | PRN
  Administered 2020-03-31: 160 mg via INTRAVENOUS
  Administered 2020-03-31: 40 mg via INTRAVENOUS

## 2020-03-31 MED ORDER — METOPROLOL SUCCINATE ER 50 MG PO TB24
50.0000 mg | ORAL_TABLET | Freq: Every day | ORAL | Status: DC
  Filled 2020-03-31: qty 1

## 2020-03-31 MED ORDER — FENTANYL CITRATE (PF) 100 MCG/2ML IJ SOLN
INTRAMUSCULAR | Status: DC | PRN
  Administered 2020-03-31: 100 ug via INTRAVENOUS
  Administered 2020-03-31: 50 ug via INTRAVENOUS
  Administered 2020-03-31: 100 ug via INTRAVENOUS
  Administered 2020-03-31 (×2): 50 ug via INTRAVENOUS

## 2020-03-31 MED ORDER — SUGAMMADEX SODIUM 200 MG/2ML IV SOLN
INTRAVENOUS | Status: DC | PRN
  Administered 2020-03-31: 200 mg via INTRAVENOUS

## 2020-03-31 MED ORDER — MIDAZOLAM HCL 5 MG/5ML IJ SOLN
INTRAMUSCULAR | Status: DC | PRN
  Administered 2020-03-31: 2 mg via INTRAVENOUS

## 2020-03-31 MED ORDER — METRONIDAZOLE IN NACL 5-0.79 MG/ML-% IV SOLN
INTRAVENOUS | Status: AC
  Filled 2020-03-31: qty 100

## 2020-03-31 MED ORDER — ONDANSETRON HCL 4 MG PO TABS: 4.0000 mg | ORAL_TABLET | Freq: Four times a day (QID) | ORAL | Status: DC | PRN

## 2020-03-31 MED ORDER — ROCURONIUM BROMIDE 100 MG/10ML IV SOLN
INTRAVENOUS | Status: DC | PRN
  Administered 2020-03-31: 10 mg via INTRAVENOUS
  Administered 2020-03-31: 60 mg via INTRAVENOUS

## 2020-03-31 MED ORDER — MORPHINE SULFATE (PF) 4 MG/ML IV SOLN: 1.0000 mg | INTRAVENOUS | Status: DC | PRN

## 2020-03-31 MED ORDER — KETOROLAC TROMETHAMINE 30 MG/ML IJ SOLN
INTRAMUSCULAR | Status: DC | PRN
  Administered 2020-03-31: 30 mg via INTRAVENOUS

## 2020-03-31 MED ORDER — LACTATED RINGERS IV SOLN: INTRAVENOUS | Status: DC

## 2020-03-31 MED ORDER — FENTANYL CITRATE (PF) 250 MCG/5ML IJ SOLN
INTRAMUSCULAR | Status: AC
  Filled 2020-03-31: qty 5

## 2020-03-31 MED ORDER — MIDAZOLAM HCL 2 MG/2ML IJ SOLN
INTRAMUSCULAR | Status: AC
  Filled 2020-03-31: qty 2

## 2020-03-31 MED ORDER — EPHEDRINE 5 MG/ML INJ
INTRAVENOUS | Status: AC
  Filled 2020-03-31: qty 10

## 2020-03-31 MED ORDER — LORAZEPAM 0.5 MG PO TABS
0.5000 mg | ORAL_TABLET | Freq: Every evening | ORAL | Status: DC | PRN
  Filled 2020-03-31: qty 1

## 2020-03-31 MED ORDER — ROCURONIUM BROMIDE 10 MG/ML (PF) SYRINGE
PREFILLED_SYRINGE | INTRAVENOUS | Status: AC
  Filled 2020-03-31: qty 10

## 2020-03-31 MED ORDER — DEXAMETHASONE SODIUM PHOSPHATE 4 MG/ML IJ SOLN
INTRAMUSCULAR | Status: DC | PRN
  Administered 2020-03-31: 10 mg via INTRAVENOUS

## 2020-03-31 MED ORDER — CIPROFLOXACIN IN D5W 400 MG/200ML IV SOLN
INTRAVENOUS | Status: AC
  Filled 2020-03-31: qty 200

## 2020-03-31 MED ORDER — OXYCODONE-ACETAMINOPHEN 5-325 MG PO TABS
1.0000 | ORAL_TABLET | ORAL | Status: DC | PRN
  Administered 2020-03-31: 2 via ORAL
  Administered 2020-03-31 – 2020-04-01 (×5): 1 via ORAL

## 2020-03-31 MED ORDER — LIDOCAINE HCL (CARDIAC) PF 100 MG/5ML IV SOSY
PREFILLED_SYRINGE | INTRAVENOUS | Status: DC | PRN
  Administered 2020-03-31: 100 mg via INTRAVENOUS

## 2020-03-31 MED ORDER — ENOXAPARIN SODIUM 40 MG/0.4ML ~~LOC~~ SOLN
40.0000 mg | SUBCUTANEOUS | Status: AC
  Administered 2020-03-31: 40 mg via SUBCUTANEOUS

## 2020-03-31 MED ORDER — ENOXAPARIN SODIUM 40 MG/0.4ML ~~LOC~~ SOLN
SUBCUTANEOUS | Status: AC
  Filled 2020-03-31: qty 0.4

## 2020-03-31 MED ORDER — OXYCODONE HCL 5 MG PO TABS: 5.0000 mg | ORAL_TABLET | Freq: Once | ORAL | Status: DC | PRN

## 2020-03-31 MED ORDER — DEXAMETHASONE SODIUM PHOSPHATE 10 MG/ML IJ SOLN
INTRAMUSCULAR | Status: AC
  Filled 2020-03-31: qty 1

## 2020-03-31 MED ORDER — ACETAMINOPHEN 500 MG PO TABS
1000.0000 mg | ORAL_TABLET | Freq: Once | ORAL | Status: AC
  Administered 2020-03-31: 1000 mg via ORAL

## 2020-03-31 MED ORDER — CIPROFLOXACIN IN D5W 400 MG/200ML IV SOLN
400.0000 mg | INTRAVENOUS | Status: AC
  Administered 2020-03-31: 400 mg via INTRAVENOUS

## 2020-03-31 MED ORDER — LIDOCAINE 2% (20 MG/ML) 5 ML SYRINGE
INTRAMUSCULAR | Status: AC
  Filled 2020-03-31: qty 5

## 2020-03-31 MED ORDER — METRONIDAZOLE IN NACL 5-0.79 MG/ML-% IV SOLN
500.0000 mg | INTRAVENOUS | Status: AC
  Administered 2020-03-31: 500 mg via INTRAVENOUS

## 2020-03-31 MED ORDER — HYDROMORPHONE HCL 1 MG/ML IJ SOLN
0.2500 mg | INTRAMUSCULAR | Status: DC | PRN
  Administered 2020-03-31: 0.5 mg via INTRAVENOUS

## 2020-03-31 MED ORDER — ACETAMINOPHEN 500 MG PO TABS
ORAL_TABLET | ORAL | Status: AC
  Filled 2020-03-31: qty 2

## 2020-03-31 MED ORDER — DEXMEDETOMIDINE HCL 200 MCG/2ML IV SOLN
INTRAVENOUS | Status: DC | PRN
  Administered 2020-03-31: 8 ug via INTRAVENOUS
  Administered 2020-03-31: 4 ug via INTRAVENOUS
  Administered 2020-03-31: 8 ug via INTRAVENOUS

## 2020-03-31 MED ORDER — HYDROMORPHONE HCL 1 MG/ML IJ SOLN
INTRAMUSCULAR | Status: AC
  Filled 2020-03-31: qty 1

## 2020-03-31 MED ORDER — CHLORHEXIDINE GLUCONATE 0.12 % MT SOLN: 15.0000 mL | Freq: Once | OROMUCOSAL | Status: DC

## 2020-03-31 MED ORDER — OXYCODONE-ACETAMINOPHEN 5-325 MG PO TABS
ORAL_TABLET | ORAL | Status: AC
  Filled 2020-03-31: qty 2

## 2020-03-31 MED ORDER — IBUPROFEN 800 MG PO TABS: 800.0000 mg | ORAL_TABLET | Freq: Three times a day (TID) | ORAL | Status: DC | PRN

## 2020-03-31 MED ORDER — SODIUM CHLORIDE 0.9% FLUSH
INTRAVENOUS | Status: DC | PRN
  Administered 2020-03-31: 30 mL

## 2020-03-31 MED ORDER — DEXMEDETOMIDINE (PRECEDEX) IN NS 20 MCG/5ML (4 MCG/ML) IV SYRINGE
PREFILLED_SYRINGE | INTRAVENOUS | Status: AC
  Filled 2020-03-31: qty 5

## 2020-03-31 MED ORDER — SCOPOLAMINE 1 MG/3DAYS TD PT72
1.0000 | MEDICATED_PATCH | TRANSDERMAL | Status: DC
  Administered 2020-03-31: 1.5 mg via TRANSDERMAL

## 2020-03-31 MED ORDER — KETOROLAC TROMETHAMINE 30 MG/ML IJ SOLN: 30.0000 mg | Freq: Once | INTRAMUSCULAR | Status: AC | PRN

## 2020-03-31 MED ORDER — SODIUM CHLORIDE 0.9 % IV SOLN
INTRAVENOUS | Status: DC | PRN
  Administered 2020-03-31: 60 mL

## 2020-03-31 MED ORDER — IRBESARTAN 300 MG PO TABS
300.0000 mg | ORAL_TABLET | Freq: Every day | ORAL | Status: DC
  Filled 2020-03-31: qty 1

## 2020-03-31 MED ORDER — MENTHOL 3 MG MT LOZG: 1.0000 | LOZENGE | OROMUCOSAL | Status: DC | PRN

## 2020-03-31 MED ORDER — KETOROLAC TROMETHAMINE 15 MG/ML IJ SOLN
INTRAMUSCULAR | Status: AC
  Filled 2020-03-31: qty 1

## 2020-03-31 MED ORDER — SODIUM CHLORIDE 0.9 % IR SOLN
Status: DC | PRN
  Administered 2020-03-31: 3000 mL

## 2020-03-31 MED ORDER — SCOPOLAMINE 1 MG/3DAYS TD PT72
MEDICATED_PATCH | TRANSDERMAL | Status: AC
  Filled 2020-03-31: qty 1

## 2020-03-31 MED ORDER — FENTANYL CITRATE (PF) 100 MCG/2ML IJ SOLN
INTRAMUSCULAR | Status: AC
  Filled 2020-03-31: qty 2

## 2020-03-31 MED ORDER — BUPIVACAINE HCL (PF) 0.25 % IJ SOLN
INTRAMUSCULAR | Status: DC | PRN
  Administered 2020-03-31: 8 mL

## 2020-03-31 MED ORDER — ONDANSETRON HCL 4 MG/2ML IJ SOLN: 4.0000 mg | Freq: Four times a day (QID) | INTRAMUSCULAR | Status: DC | PRN

## 2020-03-31 SURGICAL SUPPLY — 66 items
ADH SKN CLS APL DERMABOND .7 (GAUZE/BANDAGES/DRESSINGS) ×1
APL SRG 38 LTWT LNG FL B (MISCELLANEOUS) ×1
APPLICATOR ARISTA FLEXITIP XL (MISCELLANEOUS) ×1 IMPLANT
BARRIER ADHS 3X4 INTERCEED (GAUZE/BANDAGES/DRESSINGS) IMPLANT
BRR ADH 4X3 ABS CNTRL BYND (GAUZE/BANDAGES/DRESSINGS)
CABLE HIGH FREQUENCY MONO STRZ (ELECTRODE) IMPLANT
CANISTER SUCT 3000ML PPV (MISCELLANEOUS) ×2 IMPLANT
CELL SAVER LIPIGURD (MISCELLANEOUS) IMPLANT
COVER BACK TABLE 60X90IN (DRAPES) IMPLANT
COVER MAYO STAND STRL (DRAPES) ×2 IMPLANT
COVER SURGICAL LIGHT HANDLE (MISCELLANEOUS) ×1 IMPLANT
COVER WAND RF STERILE (DRAPES) ×2 IMPLANT
DECANTER SPIKE VIAL GLASS SM (MISCELLANEOUS) ×4 IMPLANT
DERMABOND ADVANCED (GAUZE/BANDAGES/DRESSINGS) ×1
DERMABOND ADVANCED .7 DNX12 (GAUZE/BANDAGES/DRESSINGS) ×1 IMPLANT
DEVICE RETRIEVAL ALEXIS 14 (MISCELLANEOUS) IMPLANT
DURAPREP 26ML APPLICATOR (WOUND CARE) ×1 IMPLANT
EXTRT SYSTEM ALEXIS 14CM (MISCELLANEOUS)
EXTRT SYSTEM ALEXIS 17CM (MISCELLANEOUS)
GAUZE 4X4 16PLY RFD (DISPOSABLE) ×2 IMPLANT
GLOVE BIO SURGEON STRL SZ 6.5 (GLOVE) ×5 IMPLANT
GLOVE BIOGEL PI IND STRL 7.0 (GLOVE) ×1 IMPLANT
GLOVE BIOGEL PI INDICATOR 7.0 (GLOVE) ×1
GOWN STRL REUS W/TWL LRG LVL3 (GOWN DISPOSABLE) ×8 IMPLANT
HEMOSTAT ARISTA ABSORB 3G PWDR (HEMOSTASIS) ×1 IMPLANT
HOLDER FOLEY CATH W/STRAP (MISCELLANEOUS) IMPLANT
LIGASURE VESSEL 5MM BLUNT TIP (ELECTROSURGICAL) ×2 IMPLANT
NEEDLE INSUFFLATION 120MM (ENDOMECHANICALS) ×2 IMPLANT
OCCLUDER COLPOPNEUMO (BALLOONS) ×2 IMPLANT
PACK LAPAROSCOPY BASIN (CUSTOM PROCEDURE TRAY) ×2 IMPLANT
PACK TRENDGUARD 450 HYBRID PRO (MISCELLANEOUS) IMPLANT
POUCH LAPAROSCOPIC INSTRUMENT (MISCELLANEOUS) ×2 IMPLANT
PROTECTOR NERVE ULNAR (MISCELLANEOUS) ×4 IMPLANT
RETRACTOR WOUND ALXS 19CM XSML (INSTRUMENTS) IMPLANT
RTRCTR WOUND ALEXIS 19CM XSML (INSTRUMENTS)
SCISSORS LAP 5X35 DISP (ENDOMECHANICALS) IMPLANT
SET IRRIG Y TYPE TUR BLADDER L (SET/KITS/TRAYS/PACK) ×2 IMPLANT
SET SUCTION IRRIG HYDROSURG (IRRIGATION / IRRIGATOR) ×2 IMPLANT
SET TRI-LUMEN FLTR TB AIRSEAL (TUBING) ×2 IMPLANT
SHEARS HARMONIC ACE PLUS 36CM (ENDOMECHANICALS) ×2 IMPLANT
SLEEVE ADV FIXATION 5X100MM (TROCAR) ×2 IMPLANT
SUT VIC AB 0 CT1 27 (SUTURE) ×4
SUT VIC AB 0 CT1 27XBRD ANBCTR (SUTURE) ×2 IMPLANT
SUT VIC AB 4-0 PS2 18 (SUTURE) ×2 IMPLANT
SUT VICRYL 0 UR6 27IN ABS (SUTURE) IMPLANT
SUT VLOC 180 0 9IN  GS21 (SUTURE) ×2
SUT VLOC 180 0 9IN GS21 (SUTURE) IMPLANT
SYR 10ML LL (SYRINGE) ×2 IMPLANT
SYR 50ML LL SCALE MARK (SYRINGE) ×4 IMPLANT
SYR CONTROL 10ML LL (SYRINGE) ×1 IMPLANT
SYSTEM CARTER THOMASON II (TROCAR) IMPLANT
SYSTEM CONTND EXTRCTN KII BLLN (MISCELLANEOUS) IMPLANT
TIP RUMI ORANGE 6.7MMX12CM (TIP) IMPLANT
TIP UTERINE 5.1X6CM LAV DISP (MISCELLANEOUS) IMPLANT
TIP UTERINE 6.7X10CM GRN DISP (MISCELLANEOUS) IMPLANT
TIP UTERINE 6.7X6CM WHT DISP (MISCELLANEOUS) IMPLANT
TIP UTERINE 6.7X8CM BLUE DISP (MISCELLANEOUS) ×1 IMPLANT
TOWEL OR 17X26 10 PK STRL BLUE (TOWEL DISPOSABLE) ×4 IMPLANT
TRAY FOLEY W/BAG SLVR 14FR LF (SET/KITS/TRAYS/PACK) ×2 IMPLANT
TRENDGUARD 450 HYBRID PRO PACK (MISCELLANEOUS) ×2
TROCAR ADV FIXATION 5X100MM (TROCAR) ×1 IMPLANT
TROCAR BLADELESS OPT 5 100 (ENDOMECHANICALS) ×2 IMPLANT
TROCAR PORT AIRSEAL 5X120 (TROCAR) ×1 IMPLANT
TROCAR PORT AIRSEAL 8X120 (TROCAR) ×1 IMPLANT
TROCAR XCEL NON BLADE 8MM B8LT (ENDOMECHANICALS) ×1 IMPLANT
WARMER LAPAROSCOPE (MISCELLANEOUS) ×2 IMPLANT

## 2020-03-31 NOTE — Progress Notes (Signed)
Day of Surgery Procedure(s) (LRB): TOTAL LAPAROSCOPIC HYSTERECTOMY WITH BILATERAL SALPINGECTOMY POSSIBLE BILATERAL OOPHORECTOMY (N/A) CYSTOSCOPY (N/A) [place] entry for hospital rounds at 6:00 pm.  Subjective: Patient reports tolerating PO.   Took one Percocet.  Second time out of bed to ambulate.  Was not able to void and had I and O cath done.   Objective: I have reviewed patient's vital signs, intake and output and labs. Vitals:   03/31/20 1237 03/31/20 1729  BP: 123/76 (!) 145/83  Pulse: (!) 57 (!) 59  Resp: 15 16  Temp: (!) 97.5 F (36.4 C) 97.8 F (36.6 C)  SpO2: 97% 95%   CBC    Component Value Date/Time   WBC 11.0 (H) 03/31/2020 1512   RBC 4.21 03/31/2020 1512   HGB 13.1 03/31/2020 1512   HGB 14.6 03/07/2018 1148   HCT 40.0 03/31/2020 1512   HCT 42.3 03/07/2018 1148   PLT 242 03/31/2020 1512   PLT 339 03/07/2018 1148   MCV 95.0 03/31/2020 1512   MCV 90 03/07/2018 1148   MCH 31.1 03/31/2020 1512   MCHC 32.8 03/31/2020 1512   RDW 12.0 03/31/2020 1512   RDW 12.3 03/07/2018 1148   LYMPHSABS 2.1 09/10/2019 1348   LYMPHSABS 2.5 03/07/2018 1148   MONOABS 0.8 09/10/2019 1348   EOSABS 0.2 09/10/2019 1348   EOSABS 0.2 03/07/2018 1148   BASOSABS 0.1 09/10/2019 1348   BASOSABS 0.1 03/07/2018 1148   Glucose 249 Cr 1.21 Ca 8.8   General: alert and cooperative Resp: clear to auscultation bilaterally Cardio: regular rate and rhythm, S1, S2 normal, no murmur, click, rub or gallop GI: incision: clean, dry and intact Extremities: Ted hose and PAS on.  DP 2+ bilaterally.  Vaginal Bleeding: none  Abdomen:  Hypoactive bowel sounds, soft, nontender.  Assessment: s/p Procedure(s) with comments: TOTAL LAPAROSCOPIC HYSTERECTOMY WITH BILATERAL SALPINGECTOMY POSSIBLE BILATERAL OOPHORECTOMY (N/A) - PLEASE have [employer] as prep solution.  Patient is allergic to Betadine, CHG, and Duraprep. CYSTOSCOPY (N/A): urinary retention  Plan: Will continue in hospital care  Will  check PVRs with voids and place Foley if inadequate voiding x 2.  Continue oral pain medication.  Will start anti HTNs and Metformin. Anticipate DC home in am.  Surgical findings and procedure reviewed with patient.   LOS: 0 days    Arloa Koh 03/31/2020, 8:14 PM

## 2020-03-31 NOTE — Progress Notes (Signed)
Update to History and Physical  No marked change in status since office preop visit.  Feeling nervous about surgery but wants to proceed. Patient examined.  Glucose 119, UPT negative. OK to proceed with surgery.

## 2020-03-31 NOTE — Transfer of Care (Signed)
Immediate Anesthesia Transfer of Care Note  Patient: Caroline Wong  Procedure(s) Performed: Procedure(s) (LRB): TOTAL LAPAROSCOPIC HYSTERECTOMY WITH BILATERAL SALPINGECTOMY POSSIBLE BILATERAL OOPHORECTOMY (N/A) CYSTOSCOPY (N/A)  Patient Location: PACU  Anesthesia Type: General  Level of Consciousness: awake, sedated, patient cooperative and responds to stimulation  Airway & Oxygen Therapy: Patient Spontanous Breathing and Patient connected to [place] 02 and soft FM   Post-op Assessment: Report given to PACU RN, Post -op Vital signs reviewed and stable and Patient moving all extremities  Post vital signs: Reviewed and stable  Complications: No apparent anesthesia complications

## 2020-03-31 NOTE — Anesthesia Postprocedure Evaluation (Signed)
Anesthesia Post Note  Patient: Caroline Wong  Procedure(s) Performed: TOTAL LAPAROSCOPIC HYSTERECTOMY WITH BILATERAL SALPINGECTOMY POSSIBLE BILATERAL OOPHORECTOMY (N/A ) CYSTOSCOPY (N/A )     Patient location during evaluation: PACU Anesthesia Type: General Level of consciousness: awake and alert Pain management: pain level controlled Vital Signs Assessment: post-procedure vital signs reviewed and stable Respiratory status: spontaneous breathing, nonlabored ventilation, respiratory function stable and patient connected to nasal cannula oxygen Cardiovascular status: blood pressure returned to baseline and stable Postop Assessment: no apparent nausea or vomiting Anesthetic complications: no   No complications documented.  Last Vitals:  Vitals:   03/31/20 1147 03/31/20 1237  BP: 121/70 123/76  Pulse: (!) 57 (!) 57  Resp: 15 15  Temp:  (!) 36.4 C  SpO2: 94% 97%    Last Pain:  Vitals:   03/31/20 1237  TempSrc:   PainSc: 4                  Barnet Glasgow

## 2020-03-31 NOTE — H&P (Signed)
Office Visit  03/10/2020 Caroline Wong, Caroline All, MD Obstetrics and Gynecology  Abnormal perimenopausal bleeding +2 more Dx  Surgery Consult ; Referred by Plotnikov, [name], MD Reason for Visit  Additional Documentation  Vitals:  BP 130/82 (Cuff Size: Large)  Pulse 66  Ht 5' 6.5" (1.689 m)  Wt 101.6 kg  LMP 02/15/2020 (Approximate)  BMI 35.61 kg/m  BSA 2.18 m    More Vitals  Flowsheets:  NEWS,  MEWS Score,  Anthropometrics,  Method of Visit    Encounter Info:  Billing Info,  History,  Allergies,  Detailed Report    Orthostatic Vitals Recorded in This Encounter   03/10/2020  1117     Cuff Size: Large  Wong Notes   Progress Notes by Nunzio Cobbs, MD at 03/10/2020 11:30 AM Author: Nunzio Cobbs, MD Author Type: Physician Filed: 03/13/2020  2:35 PM  Note Status: Signed Cosign: Cosign Not Required Encounter Date: 03/10/2020  Editor: Nunzio Cobbs, MD (Physician)      Prior Versions: 1. Lowella Fairy, CMA (Certified Psychologist, sport and exercise) at 03/10/2020 11:27 AM - Sign when Signing Visit     GYNECOLOGY  VISIT   HPI: 59 y.o.   Married  Caucasian  female   G2P2002 with Patient's last menstrual period was 02/15/2020 (approximate).   here for surgical consult.     Her husband is present for the entire visit today.    Patient is interested in hysterectomy for pelvic pain and abnormal perimenopausal bleeding. She declines future childbearing.    Prior evaluation at an outside office.  Pelvic US 04/17/19 showed a 1.4 cm anterior fibroid.  Endometrial biopsy 04/22/19 showed proliferative endometrium. She has been treated with cyclic Provera.     Follow up endometrial biopsy with me 06/26/19 showed a benign endometrial polyp. Sonohysterogram 07/11/19 with me showed an EMS of 12 mm and no filling defect.  No myometrial masses were noted, and her ovaries were normal.  No free fluid was  seen.   Her pelvic pain is sharp in nature and has been worse with her bleeding.  States her pain is increasing and is occurring every day.    She does have a history of colitis and was treated for Campylobacter infection in June 2020.  Her gastroenterologist, Dr. Oneida Alar, last evaluated her on 08/22/19 and assessed the pain as not gastrointestinal.   A CT scan of the abdomen and pelvis 10/22/19, ordered by her PCP, showed no acute findings in the abdomen or pelvis.  She had moderate hepatic steatosis.   She last used Vicodin more than one month ago for her pain.   She uses Advil or Tramadol prn.   The last time she had spotting was 02/15/20 following Provera use.  None since then.  Her prior spotting was in July, 2021.   Her [place] was 47.3 and her estradiol 31.1 on 06/11/19.   She has a FH of uterine cancer.    Patient states she is anxious and that her husband is her support system.  She uses Lorazepem at night for anxiety.    She has had no changes in her medical status since her last visit with me.   She did not do Covid vaccination or flu vaccine.   GYNECOLOGIC HISTORY: Patient's last menstrual period was 02/15/2020 (approximate). Contraception:  PMP Menopausal hormone therapy:  Cyclic Provera Last mammogram: 12-26-19 density b birads 1:neg Last pap smear: 06-11-2019  Neg:Neg HR HPV, 10-24-17 Neg:Neg Hr HPV, 08-19-15 Neg:Neg HR HPV                OB History     Gravida  2   Para  2   Term  2   Preterm      AB      Living  2      SAB      TAB      Ectopic      Multiple      Live Births  2                     Patient Active Problem List    Diagnosis Date Noted   Acute viral syndrome 11/27/2019   Nausea 11/27/2019   Hyperglycemia 09/10/2019   Body mass index (BMI) 35.0-35.9, adult 08/22/2019   Endometrial polyp     Asthmatic bronchitis 01/30/2019   Acute infectious diarrhea 11/27/2018   Colon cancer screening 11/14/2018   Ganglion cyst of volar  aspect of right wrist     Allergic rhinitis 06/12/2018   Panic attacks 02/06/2018   Elevated glucose 02/06/2018   Petechial rash 01/05/2018   Epistaxis 01/05/2018   Screening for colorectal cancer 10/24/2017   Encounter for gynecological examination with Papanicolaou smear of cervix 10/24/2017   Cough 06/12/2017   Upper respiratory tract infection 06/12/2017   Anxiety 11/14/2016   UTI (urinary tract infection) 11/11/2015   Diarrhea 11/11/2015   GERD (gastroesophageal reflux disease) 10/23/2015   BP check 09/24/2015   Urge incontinence 08/19/2015   Stye external 07/15/2015   S/P cervical spinal fusion 06/05/2015   Acute sinusitis 05/20/2015   Fatigue 05/13/2015   Edema 04/01/2015   Chest pain 04/01/2015   Nonspecific abnormal electrocardiogram (ECG) (EKG) 04/01/2015   Bee sting-induced anaphylaxis 02/24/2015   Neck pain, bilateral posterior 12/31/2014   Lower back pain 12/31/2014   Yeast infection 05/07/2014   Obesity 05/07/2014   Night sweats 04/14/2014   Abdominal pain 11/25/2013   Hormone replacement therapy (HRT) 08/21/2013   Well adult exam 05/16/2013   Hypertension            Past Medical History:  Diagnosis Date   Abdominal pain 11/25/2013    Has some nausea associated with pain that has been on and off x 2 weeks will get [place]   Anxiety      Takes Lorazepam   Chronic bronchitis ([place])     Endometrial polyp     Gallstones     GERD (gastroesophageal reflux disease)     Hormone replacement therapy (HRT)     Hypertension     Night sweats 04/14/2014   Obesity 05/07/2014   STD (sexually transmitted disease)      HSV ?carrier--never had an outbreak but exposed from ex-husband   Urge incontinence 08/19/2015   Yeast infection 05/07/2014           Past Surgical History:  Procedure Laterality Date   ANTERIOR CERVICAL DECOMP/DISCECTOMY FUSION N/A 06/05/2015    Procedure: C5-6, C6-7 Anterior Cervical Discectomy and Fusion,  Allograft, Plate;  Surgeon: [name], MD;  Location: [facility];  Service: Orthopedics;  Laterality: N/A;   ANTERIOR FUSION CERVICAL SPINE   06/05/2015    C 5 6 7     CHOLECYSTECTOMY       COLONOSCOPY   2015    NO POLYPS   FOOT SURGERY Left     GANGLION CYST EXCISION Right 07/09/2018  Procedure: EXCISION, RIGHT VOLAR WRIST GANGLION;  Surgeon: [name], MD;  Location: [facility];  Service: Orthopedics;  Laterality: Right;   MOLE REMOVAL       WISDOM TOOTH EXTRACTION                Current Outpatient Medications  Medication Sig Dispense Refill   Cholecalciferol (VITAMIN D3) 5000 units TABS Take 2 tablets by mouth daily. Takes 2 daily        cyclobenzaprine (FLEXERIL) 10 MG tablet Take 1 tablet (10 mg total) by mouth 3 (three) times daily as needed for muscle spasms. (Patient taking differently: Take 10 mg by mouth as needed for muscle spasms. ) 60 tablet 0   diphenhydrAMINE (BENADRYL) 25 MG tablet Take 2 tablets (50 mg total) by mouth every 4 (four) hours as needed (bee sting). (Patient taking differently: Take 50 mg by mouth as needed (bee sting). ) 30 tablet 3   HYDROcodone-acetaminophen (NORCO/VICODIN) 5-325 MG tablet Take 1 tablet by mouth every 6 (six) hours as needed.       ibuprofen (ADVIL) 600 MG tablet Take twice a day x 1 week, then prn pain 60 tablet 1   LORazepam (ATIVAN) 0.5 MG tablet TAKE 1 TABLET(0.5 MG) BY MOUTH AT BEDTIME AS NEEDED 30 tablet 1   medroxyPROGESTERone (PROVERA) 10 MG tablet Take 1 (10 mg)  tablet every day for 10 days every 2 months. 30 tablet 0   metFORMIN (GLUCOPHAGE-XR) 500 MG 24 hr tablet Take 1 tablet (500 mg total) by mouth daily with breakfast. 4 tablets at hs 90 tablet 3   Metoprolol Succinate 50 MG CS24 Take by mouth.       olmesartan (BENICAR) 40 MG tablet TAKE 1 TABLET(40 MG) BY MOUTH DAILY 90 tablet 1   omeprazole (PRILOSEC) 40 MG capsule Take 1 capsule (40 mg total) by mouth daily. 90 capsule 3   ondansetron  (ZOFRAN-ODT) 4 MG disintegrating tablet DISSOLVE 1 TABLET(4 MG) ON THE TONGUE EVERY 8 HOURS AS NEEDED FOR NAUSEA OR VOMITING 20 tablet 1   phentermine (ADIPEX-P) 37.5 MG tablet TAKE 1 TABLET(37.5MG ) BY MOUTH DAILY BEFORE BREAKFAST 90 tablet 0   triamterene-hydrochlorothiazide (MAXZIDE-25) 37.5-25 MG tablet TAKE 1 TABLET BY MOUTH DAILY 90 tablet 3    No current facility-administered medications for this visit.             Facility-Administered Medications Ordered in Other Visits  Medication Dose Route Frequency Provider Last Rate Last Admin   aminophylline injection 75 mg  75 mg Intravenous BID PRN Dorothy Spark, MD   75 mg at 04/13/15 1037      ALLERGIES: Bee venom; Biaxin [clarithromycin]; Amlodipine; Betadine [povidone iodine]; Chloraprep one step [chlorhexidine gluconate]; Duraprep [antiseptic products, misc.]; and Keflex [cephalexin]        Family History  Problem Relation Age of Onset   Breast cancer Mother 37        breast   Diabetes Mother     Fibromyalgia Mother     Heart disease Mother          CHF   Hypertension Mother     Kidney disease Mother     Heart disease Father     Emphysema Father     Diabetes Brother     Hypertension Brother          x 2   Heart disease Maternal Grandmother     Diabetes Maternal Grandmother     Asthma Maternal Grandmother  Arthritis Maternal Grandmother     Heart disease Maternal Grandfather     Arthritis Maternal Grandfather     Cancer Maternal Grandfather          bladder   Breast cancer Paternal Grandmother     Heart disease Paternal Grandmother     Arthritis Paternal Grandmother     Stroke Paternal Grandmother     Ovarian cancer Paternal Grandmother     Diabetes Paternal Grandfather     Heart disease Paternal Grandfather     Colon cancer Paternal Grandfather          mets   Arthritis Paternal Grandfather     Stomach cancer Paternal Grandfather          mets to stomach   Hypertension  Brother     Cancer Sister          ?endometrial ca   Breast cancer Maternal Aunt     Breast cancer Paternal Aunt     Esophageal cancer Neg Hx     Pancreatic cancer Neg Hx     Rectal cancer Neg Hx        Social History         Socioeconomic History   Marital status: Married      Spouse name: [name]   Number of children: 4   Years of education: college   Highest education level: Not on file  Occupational History   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 SUPERVISOR at [employer]    One caffeinated beverage daily    This was updated 12/12/2013    Social Determinants of Health       Financial Resource Strain:    Difficulty of Paying Living Expenses: Not on file  Food Insecurity:    Worried About Charity fundraiser in the Last Year: Not on file   [employer] of Food in the Last Year: Not on file  Transportation Needs:    Lack of Transportation (Medical): Not on file   Lack of Transportation (Non-Medical): Not on file  Physical Activity:    Days of Exercise per Week: Not on file   Minutes of Exercise per Session: Not on file  Stress:    Feeling of Stress : Not on file  Social Connections:    Frequency of Communication with Friends and Family: Not on file   Frequency of Social Gatherings with Friends and Family: Not on file   Attends Religious Services: Not on file   Active Member of Clubs or Organizations: Not on file   Attends Archivist Meetings: Not on file   Marital  Status: Not on file  Intimate Partner Violence:    Fear of Current or Ex-Partner: Not on file   Emotionally Abused: Not on file   Physically Abused: Not on file   Sexually Abused: Not on file      Review of Systems  Wong other systems reviewed and are negative.     PHYSICAL EXAMINATION:     BP 130/82 (Cuff Size: Large)    Pulse 66    Ht 5' 6.5" (1.689 m)    Wt 224 lb (101.6 kg)    LMP 02/15/2020 (Approximate)    BMI 35.61 kg/m     General appearance: alert, cooperative and appears stated age Head: Normocephalic, without obvious abnormality, atraumatic Neck: no adenopathy, supple, symmetrical, trachea midline and thyroid normal to inspection and palpation Lungs: clear to auscultation bilaterally Heart: regular rate and rhythm Abdomen: soft, non-tender, no masses,  no organomegaly Extremities: extremities normal, atraumatic, no cyanosis or edema Skin: Skin color, texture, turgor normal. No rashes or lesions No abnormal inguinal nodes palpated Neurologic: Grossly normal   Pelvic: External genitalia:  no lesions              Urethra:  normal appearing urethra with no masses, tenderness or lesions              Bartholins and Skenes: normal                 Vagina: normal appearing vagina with normal color and discharge, no lesions              Cervix: no lesions                Bimanual Exam:  Uterus:  normal size, contour, position, consistency, mobility, non-tender              Adnexa: no mass, fullness, tenderness            Chaperone was present for exam.   ASSESSMENT   Abnormal perimenopausal bleeding.  Pelvic pain.  Small fibroid on prior US.  FH uterine cancer. Allergies to multiple skin sterilizing preps for surgery. Anxiety.  HTN.  Diabetes.  On Metformin XR. On Phentermine.   PLAN   I discussed total laparoscopic hysterectomy with bilateral salpingo-oophorectomy, and cystoscopy.     I reviewed risks, benefits, and alternatives.  Risks include but are not  limited to bleeding, infection, damage to surrounding organs, reaction to anesthesia, DVT, PE, death, vaginal cuff dehiscence, continued pain, need to convert to a traditional laparotomy incision to complete the procedure, and menopausal symptoms with removal of the ovaries.  We discussed menopausal symptoms of hot flashes and decreased libido, and vaginal dryness.    She will receive Lovenox for DVT/PE prophylaxis.   She will stop her phentermine now.   Surgical expectations and recovery discussed.    Questions invited and answered.   Patient wishes to proceed.   She will see her PCP for anxiety treatment.

## 2020-03-31 NOTE — Anesthesia Procedure Notes (Signed)
Procedure Name: Intubation Date/Time: 03/31/2020 7:44 AM Performed by: Justice Rocher, CRNA Pre-anesthesia Checklist: Patient identified, Emergency Drugs available, Suction available, Patient being monitored and Timeout performed Patient Re-evaluated:Patient Re-evaluated prior to induction Oxygen Delivery Method: Circle system utilized Preoxygenation: Pre-oxygenation with 100% oxygen Induction Type: IV induction Ventilation: Mask ventilation without difficulty Laryngoscope Size: Mac and 4 Grade View: Grade II Tube type: Oral Tube size: 7.5 mm Number of attempts: 1 Airway Equipment and Method: Stylet and Oral airway Placement Confirmation: ETT inserted through vocal cords under direct vision,  positive ETCO2 and breath sounds checked- equal and bilateral Secured at: 22 cm Tube secured with: Tape Dental Injury: Teeth and Oropharynx as per pre-operative assessment

## 2020-03-31 NOTE — Op Note (Addendum)
OPERATIVE REPORT  PREOPERATIVE DIAGNOSIS:  Abnormal perimenopausal bleeding, pelvic pain.   POSTOPERATIVE DIAGNOSIS:  Abnormal perimenopausal bleeding, pelvic pain.   PROCEDURES: Total laparoscopic hysterectomy with bilateral salpingo-oophorectomy, cystoscopy  SURGEON: Lenard Galloway, M.D.  ASSISTANT:  Dorothy Spark, M.D.  ANESTHESIA: General endotracheal, intraperitoneal ropivicaine 30 mL diluted in 30 mL of normal saline, local with 0.25% Marcaine.  IVF:   1300 cc LR  ESTIMATED BLOOD LOSS:   10 cc  URINE OUTPUT:  626 cc  COMPLICATIONS: None.  INDICATIONS FOR THE PROCEDURE:    The patient is a 59 year old Caucasian female, who presents with abnormal perimenopausal bleeding and pelvic pain. Pelvic ultrasound showed a small fibroid and endometrial biopsy demonstrated a polyp which was not detectable with office sonohysterogram.  She had CT scan which showed no acute findings to explain her pain, and completed gastroenterology consultation.   Her bleeding has been treated with periodic progesterone treatment.   She declines future childbearing and requests hysterectomy with removal of tubes and ovaries.  A plan is made to proceed with a total laparoscopic hysterectomy with bilateral salpingectomy, possible bilateral oophorectomy, and cystoscopy after risks, benefits, and alternatives are reviewed.  FINDINGS:    Laparoscopy revealed a normal uterus and bilateral tubes and ovaries.  The upper abdomen was normal and demonstrated a normal liver.  No endometriosis was seen in the abdomen or pelvis.  no adhesive disease was noted. The appendix was normal.   Cystoscopy at the termination of the procedure evaluated the bladder 360 degrees including the bladder dome and trigone. There was no evidence of any foreign body in the bladder or the urethra. There was no evidence of any lesions of the urethra.  Both of the ureters were noted to be patent  bilaterally.  SPECIMENS:    The uterus, cervix, and bilateral tubes and ovaries were went to pathology.   DESCRIPTION OF PROCEDURE:   The patient was reidentified in the preoperative hold area.  She did receive Ciprofloxacin and Flagyl IV for antibiotic prophylaxis. She received Lovenox, TED hose and PAS stockings for DVT prophylaxis.  In the operating room, the patient was placed in the dorsal lithotomy position on the operating room table. The Trendguard was used to support her on the OR table. Her legs were placed in the Nimrod stirrups and her arms were both padded and tucked at her sides. The patient received general endotracheal anesthesia. The abdomen and vagina were then sterilely prepped and she was sterilely draped.  A speculum was placed in the vagina and a single-tooth tenaculum was placed on the anterior cervical lip. A figure-of-eight suture of 0 Vicryl was placed on each the anterior and the posterior cervical lips. The uterus was sounded to 8 cm. The cervix was then dilated with Hegar dilators. A #8 RUMI tip with a small metal KOH ring was then placed through the cervix and into the uterine cavity without difficulty. The remaining vaginal instruments were then removed. A Foley catheter was placed inside the bladder.  Attention was turned to the abdomen where the umbilical region was injected with 0.25% Marcaine and a small incision created.  A Veress needle was then used to insufflate the abdomen with CO2 gas after a saline drop test was performed and the fluid flowed freely.  A 5 mm umbilical incision was created with a scalpel after the skin. A 5 mm camera port was then placed using the Optiview. Incisions were then created in the left upper abdomen and the  left lower abdomen after the skin was injected locally with 0.25% Marcaine.  An 8 mm trocars was placed in the left upper incision and a 5 mm incision was placed in the left lower incision under visualization of  the laparoscope.  An 8 mm trocar was placed in the right lower quadrant after injecting with Marcaine and incising with a scalpel.     Ropivicine was placed inside the peritoneal cavity.   The patient was placed in Trendelenburg position. An inspection of the abdomen and pelvis was performed. The findings are as noted above.   The bilateral ureters were identified.  The left infundibulopelvic ligament was grasped and the Ligasure was used to cauterize and cut the pedicle.  The left broad ligament and then the round ligament were similarly cauterized and cut with the Ligasure. Dissection was performed to the anterior and posterior leaves of the broad ligaments using the Harmonic scalpel. The vesicouterine fold was dissected with the Harmonic scalpel.  The right uterine artery was skeletonized.  Attention was turned to the patient's right-hand side at this time. The same procedure performed on the left side was then repeated on the right side with respect to the pedicles.  The right uterine artery was skeletonized.   It was then cauterized and cut with the Ligasure instrument.  The same was performed to the left uterine artery.    The KOH ring was nicely visible. The colpotomy incision was performed with the Harmonic scalpel in a circumferential fashion. The uterine specimen was then removed from the peritoneal cavity and was later sent to Pathology. There was some bleeding from the right vaginal cuff, and this was treated with the Ligasure for hemostasis.  The vaginal cuff was then sutured using a running suture of 0 V-Loc. The vagina was closed from the patient's right hand side to the left hand side and then back 2 sutures towards the midline. This provided good full-thickness closure of the vaginal cuff.  The laparoscopic needle for suturing was removed from the peritoneal cavity.  The pelvis was irrigated and suctioned.  The pneumoperitoneal was let down. Hemostasis was good.    Carleene Overlie was placed in the pelvis over the operating sites.   The pneumoperitoneum was released, the patient received manual breaths, and the trocars were removed.    The patient's Foley catheter was removed, and cystoscopy was performed.  The findings are as noted above. The Foley catheter was left out.   Final inspection of the vagina demonstrated good hemostasis of the vaginal cuff.  All skin incisions were closed with subcuticular sutures of 4-0 Vicryl. Dermabond was placed over the incisions.  This concluded the patient's procedure. She was extubated and escorted to the recovery room in stable and awake condition. There were no complications to the procedure. All needle, instrument, and sponge counts were correct.  An MD assistant was necessary for tissue manipulation, management of instrumentation, retraction and positioning due to the complexity of the case.

## 2020-04-01 ENCOUNTER — Encounter (HOSPITAL_BASED_OUTPATIENT_CLINIC_OR_DEPARTMENT_OTHER): Payer: Self-pay | Admitting: Obstetrics and Gynecology

## 2020-04-01 DIAGNOSIS — Z79899 Other long term (current) drug therapy: Secondary | ICD-10-CM | POA: Diagnosis not present

## 2020-04-01 DIAGNOSIS — R102 Pelvic and perineal pain: Secondary | ICD-10-CM | POA: Diagnosis not present

## 2020-04-01 DIAGNOSIS — I10 Essential (primary) hypertension: Secondary | ICD-10-CM | POA: Diagnosis not present

## 2020-04-01 DIAGNOSIS — Z7984 Long term (current) use of oral hypoglycemic drugs: Secondary | ICD-10-CM | POA: Diagnosis not present

## 2020-04-01 DIAGNOSIS — N959 Unspecified menopausal and perimenopausal disorder: Secondary | ICD-10-CM | POA: Diagnosis not present

## 2020-04-01 DIAGNOSIS — E119 Type 2 diabetes mellitus without complications: Secondary | ICD-10-CM | POA: Diagnosis not present

## 2020-04-01 LAB — BASIC METABOLIC PANEL
Anion gap: 8 (ref 5–15)
BUN: 25 mg/dL — ABNORMAL HIGH (ref 6–20)
CO2: 25 mmol/L (ref 22–32)
Calcium: 8.5 mg/dL — ABNORMAL LOW (ref 8.9–10.3)
Chloride: 105 mmol/L (ref 98–111)
Creatinine, Ser: 1.11 mg/dL — ABNORMAL HIGH (ref 0.44–1.00)
GFR, Estimated: 58 mL/min — ABNORMAL LOW (ref >60)
Glucose, Bld: 183 mg/dL — ABNORMAL HIGH (ref 70–99)
Potassium: 4.3 mmol/L (ref 3.5–5.1)
Sodium: 138 mmol/L (ref 135–145)

## 2020-04-01 LAB — SURGICAL PATHOLOGY

## 2020-04-01 MED ORDER — OXYCODONE-ACETAMINOPHEN 5-325 MG PO TABS
ORAL_TABLET | ORAL | Status: AC
  Filled 2020-04-01: qty 1

## 2020-04-01 MED ORDER — IBUPROFEN 800 MG PO TABS: 800.0000 mg | ORAL_TABLET | Freq: Three times a day (TID) | ORAL | 0 refills | Status: DC | PRN

## 2020-04-01 MED ORDER — KETOROLAC TROMETHAMINE 15 MG/ML IJ SOLN
INTRAMUSCULAR | Status: AC
  Filled 2020-04-01: qty 1

## 2020-04-01 MED ORDER — OXYCODONE-ACETAMINOPHEN 5-325 MG PO TABS
1.0000 | ORAL_TABLET | ORAL | 0 refills | Status: DC | PRN
Start: 2020-04-01 — End: 2020-05-07

## 2020-04-01 NOTE — Discharge Instructions (Signed)

## 2020-04-01 NOTE — Progress Notes (Signed)
1 Day Post-Op Procedure(s) (LRB): TOTAL LAPAROSCOPIC HYSTERECTOMY WITH BILATERAL SALPINGECTOMY POSSIBLE BILATERAL OOPHORECTOMY (N/A) CYSTOSCOPY (N/A)  Subjective: Patient reports tolerating PO and no problems voiding.   Ready for discharge.  Objective: I have reviewed patient's vital signs, intake and output and labs.  Vitals:   04/01/20 0231 04/01/20 0524  BP: 125/69 (!) 143/81  Pulse: (!) 57 61  Resp: 18 18  Temp: 98 F (36.7 C) 97.8 F (36.6 C)  SpO2: 98% 100%   UO - 10/26 - 1645 cc UO  10/26 - 200 cc  Glucose 183 Ca 8.5 Cr 1.11  General: alert and cooperative Resp: clear to auscultation bilaterally Cardio: regular rate and rhythm, S1, S2 normal, no murmur, click, rub or gallop GI: soft, non-tender; bowel sounds normal; no masses,  no organomegaly and incision: clean, dry and intact Extremities: Ted hose on. Vaginal Bleeding: none  Assessment: s/p Procedure(s) with comments: TOTAL LAPAROSCOPIC HYSTERECTOMY WITH BILATERAL SALPINGECTOMY POSSIBLE BILATERAL OOPHORECTOMY (N/A) - PLEASE have Technicare as prep solution.  Patient is allergic to Betadine, CHG, and Duraprep. CYSTOSCOPY (N/A): progressing well  Plan: Discharge home  Instructions and precautions given.  Rx Percocet and Motrin.  Fu in 1 week.   LOS: 0 days    Arloa Koh 04/01/2020, 8:10 AM

## 2020-04-03 ENCOUNTER — Encounter: Payer: Self-pay | Admitting: Obstetrics and Gynecology

## 2020-04-06 ENCOUNTER — Other Ambulatory Visit: Payer: Self-pay | Admitting: Internal Medicine

## 2020-04-06 MED ORDER — LORAZEPAM 0.5 MG PO TABS
ORAL_TABLET | ORAL | 1 refills | Status: DC
Start: 2020-04-06 — End: 2020-06-11

## 2020-04-06 NOTE — Progress Notes (Signed)
GYNECOLOGY  VISIT   HPI: 59 y.o.   Married  Caucasian  female   G2P2002 with Patient's last menstrual period was 02/15/2020 (approximate).   here for 1 week status post TOTAL LAPAROSCOPIC HYSTERECTOMY WITH BILATERAL SALPINGECTOMY POSSIBLE BILATERAL OOPHORECTOMY (N/A ) CYSTOSCOPY (N/A ).  Pathology report:  Benign cervix, endometrial polyp, adenomyosis, normal ovaries and fallopian tubes.  Has a pain under her right rib cage and below her umbilicus.  This pain has improved.   No pain medication today.  Hurts to sneeze or cough.   Having BMs and passing flatus.  No nausea or vomiting.   No vaginal bleeding.   States hot flashes since age 40 or 59 yo. Has used black cohosh in the past.   Scheduled to return to work on 05/12/20.  Can lift up to 70 pounds at work.   GYNECOLOGIC HISTORY: Patient's last menstrual period was 02/15/2020 (approximate). Contraception:  Hyst Menopausal hormone therapy:  none Last mammogram:  12-26-19 density b birads 1:neg  Last pap smear: 06-11-2019 Neg:Neg HR HPV, 10-24-17 Neg:Neg Hr HPV, 08-19-15 Neg:Neg HR HPV                OB History    Gravida  2   Para  2   Term  2   Preterm      AB      Living  2     SAB      TAB      Ectopic      Multiple      Live Births  2              Patient Active Problem List   Diagnosis Date Noted  . Status post surgery 03/31/2020  . Status post laparoscopic hysterectomy 03/31/2020  . Acute viral syndrome 11/27/2019  . Nausea 11/27/2019  . Hyperglycemia 09/10/2019  . Body mass index (BMI) 35.0-35.9, adult 08/22/2019  . Endometrial polyp   . Asthmatic bronchitis 01/30/2019  . Acute infectious diarrhea 11/27/2018  . Colon cancer screening 11/14/2018  . Ganglion cyst of volar aspect of right wrist   . Allergic rhinitis 06/12/2018  . Panic attacks 02/06/2018  . Elevated glucose 02/06/2018  . Petechial rash 01/05/2018  . Epistaxis 01/05/2018  . Screening for colorectal cancer 10/24/2017  .  Encounter for gynecological examination with Papanicolaou smear of cervix 10/24/2017  . Cough 06/12/2017  . Upper respiratory tract infection 06/12/2017  . Anxiety 11/14/2016  . UTI (urinary tract infection) 11/11/2015  . Diarrhea 11/11/2015  . GERD (gastroesophageal reflux disease) 10/23/2015  . BP check 09/24/2015  . Urge incontinence 08/19/2015  . Stye external 07/15/2015  . S/P cervical spinal fusion 06/05/2015  . Acute sinusitis 05/20/2015  . Fatigue 05/13/2015  . Edema 04/01/2015  . Chest pain 04/01/2015  . Nonspecific abnormal electrocardiogram (ECG) (EKG) 04/01/2015  . 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 Neck pain, bilateral posterior 12/31/2014  . Lower back pain 12/31/2014  . Yeast infection 05/07/2014  . Obesity 05/07/2014  . Night sweats 04/14/2014  . Abdominal pain 11/25/2013  . Hormone replacement therapy (HRT) 08/21/2013  . Well adult exam 05/16/2013  . Hypertension     Past Medical History:  Diagnosis Date  . Abdominal pain 11/25/2013   Has some nausea associated with pain that has been on and off x 2 weeks will get [place]  . Anxiety    Takes Lorazepam  . Cancer ([place])    skin Ca. removed from chest  . Chronic bronchitis ([place])   .  Diabetes mellitus without complication ([place])   . Elevated serum creatinine   . Endometrial polyp   . Gallstones   . GERD (gastroesophageal reflux disease)   . Hormone replacement therapy (HRT)   . Hypertension   . Night sweats 04/14/2014  . Obesity 05/07/2014  . STD (sexually transmitted disease)    HSV ?carrier--never had an outbreak but exposed from ex-husband  . Urge incontinence 08/19/2015  . Yeast infection 05/07/2014    Past Surgical History:  Procedure Laterality Date  . ANTERIOR CERVICAL DECOMP/DISCECTOMY FUSION N/A 06/05/2015   Procedure: C5-6, C6-7 Anterior Cervical Discectomy and Fusion, Allograft, Plate;  Surgeon: [name], MD;  Location: [place];  Service: Orthopedics;  Laterality: N/A;  . ANTERIOR FUSION  CERVICAL SPINE  06/05/2015   C 5 6 7    . CHOLECYSTECTOMY    . COLONOSCOPY  2015   NO POLYPS  . CYSTOSCOPY N/A 03/31/2020   Procedure: CYSTOSCOPY;  Surgeon: Nunzio Cobbs, MD;  Location: [facility];  Service: Gynecology;  Laterality: N/A;  . FOOT SURGERY Left   . GANGLION CYST EXCISION Right 07/09/2018   Procedure: EXCISION, RIGHT VOLAR WRIST GANGLION;  Surgeon: [name], MD;  Location: [place];  Service: Orthopedics;  Laterality: Right;  . MOLE REMOVAL    . TOTAL LAPAROSCOPIC HYSTERECTOMY WITH BILATERAL SALPINGO OOPHORECTOMY N/A 03/31/2020   Procedure: TOTAL LAPAROSCOPIC HYSTERECTOMY WITH BILATERAL SALPINGECTOMY POSSIBLE BILATERAL OOPHORECTOMY;  Surgeon: Nunzio Cobbs, MD;  Location: [facility];  Service: Gynecology;  Laterality: N/A;  PLEASE have [employer] as prep solution.  Patient is allergic to Betadine, CHG, and Duraprep.  . WISDOM TOOTH EXTRACTION      Current Outpatient Medications  Medication Sig Dispense Refill  . ascorbic acid (VITAMIN C) 500 MG tablet Take 500 mg by mouth daily.    . Cholecalciferol (VITAMIN D3) 5000 units TABS Take 5,000 Units by mouth daily.     . cyclobenzaprine (FLEXERIL) 10 MG tablet Take 1 tablet (10 mg total) by mouth 3 (three) times daily as needed for muscle spasms. (Patient not taking: Reported on 04/07/2020) 60 tablet 0  . diphenhydrAMINE (BENADRYL) 25 MG tablet Take 2 tablets (50 mg total) by mouth every 4 (four) hours as needed (bee sting). (Patient not taking: Reported on 03/12/2020) 30 tablet 3  . ibuprofen (ADVIL) 800 MG tablet Take 1 tablet (800 mg total) by mouth every 8 (eight) hours as needed for mild pain or moderate pain. 30 tablet 0  . LORazepam (ATIVAN) 0.5 MG tablet TAKE 1 TABLET(0.5 MG) BY MOUTH AT BEDTIME AS NEEDED 30 tablet 1  . metFORMIN (GLUCOPHAGE-XR) 500 MG 24 hr tablet Take 1 tablet (500 mg total) by mouth daily with breakfast. 4 tablets at hs 90 tablet 3   . Metoprolol Succinate 50 MG CS24 Take 50 mg by mouth daily.     [name] olmesartan (BENICAR) 40 MG tablet TAKE 1 TABLET(40 MG) BY MOUTH DAILY (Patient taking differently: Take 40 mg by mouth daily. ) 90 tablet 1  . omeprazole (PRILOSEC) 40 MG capsule Take 1 capsule (40 mg total) by mouth daily. 90 capsule 3  . ondansetron (ZOFRAN-ODT) 4 MG disintegrating tablet DISSOLVE 1 TABLET(4 MG) ON THE TONGUE EVERY 8 HOURS AS NEEDED FOR NAUSEA OR VOMITING (Patient taking differently: Take 4 mg by mouth every 8 (eight) hours as needed for nausea or vomiting. ) 20 tablet 1  . oxyCODONE-acetaminophen (PERCOCET/ROXICET) 5-325 MG tablet Take 1-2 tablets by mouth  every 4 (four) hours as needed for moderate pain. 20 tablet 0  . triamterene-hydrochlorothiazide (MAXZIDE-25) 37.5-25 MG tablet TAKE 1 TABLET BY MOUTH DAILY 90 tablet 3  . zinc gluconate 50 MG tablet Take 50 mg by mouth daily.     No current facility-administered medications for this visit.   Facility-Administered Medications Ordered in Other Visits  Medication Dose Route Frequency Provider Last Rate Last Admin  . aminophylline injection 75 mg  75 mg Intravenous BID PRN Dorothy Spark, MD   75 mg at 04/13/15 1037     ALLERGIES: Bee venom; Biaxin [clarithromycin]; Amlodipine; Betadine [povidone iodine]; Chloraprep one step [chlorhexidine gluconate]; Duraprep [antiseptic products, misc.]; and Keflex [cephalexin]  Family History  Problem Relation Age of Onset  . Breast cancer Mother 53       breast  . Diabetes Mother   . Fibromyalgia Mother   . Heart disease Mother        CHF  . Hypertension Mother   . Kidney disease Mother   . Heart disease Father   . Emphysema Father   . Diabetes Brother   . Hypertension Brother        x 2  . Heart disease Maternal Grandmother   . Diabetes Maternal Grandmother   . Asthma Maternal Grandmother   . Arthritis Maternal Grandmother   . Heart disease Maternal Grandfather   . Arthritis Maternal Grandfather   .  Cancer Maternal Grandfather        bladder  . Breast cancer Paternal Grandmother   . Heart disease Paternal Grandmother   . Arthritis Paternal Grandmother   . Stroke Paternal Grandmother   . Ovarian cancer Paternal Grandmother   . Diabetes Paternal Grandfather   . Heart disease Paternal Grandfather   . Colon cancer Paternal Grandfather        mets  . Arthritis Paternal Grandfather   . Stomach cancer Paternal Grandfather        mets to stomach  . Hypertension Brother   . Cancer Sister        ?endometrial ca  . Breast cancer Maternal Aunt   . Breast cancer Paternal Aunt   . Esophageal cancer Neg Hx   . Pancreatic cancer Neg Hx   . Rectal cancer Neg Hx     Social History   Socioeconomic History  . Marital status: Married    Spouse name: [name]  . Number of children: 4  . Years of education: college  . Highest education level: Not on file  Occupational History  . 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 Smoking status: Never Smoker  . Smokeless tobacco: Never Used  Vaping Use  . Vaping Use: Never used  Substance and Sexual Activity  . Alcohol use: Yes    Alcohol/week: 1.0 standard drink    Types: 1 Glasses of wine per week    Comment: occas.  . Drug use: No  . Sexual activity: Yes    Birth control/protection: None, Post-menopausal  Other Topics Concern  . Not on file  Social History Narrative   Married 2 sons 2 daughters. SUPERVISOR at [employer]   One caffeinated beverage daily   This was updated 12/12/2013   Social Determinants of Health   Financial Resource Strain:   . Difficulty of Paying Living Expenses: Not on file  Food Insecurity:   . Worried About Charity fundraiser in the Last Year: Not on file  . Ran  Out of Food in the Last Year: Not on file  Transportation Needs:   . Lack of Transportation (Medical): Not on file  . Lack of Transportation (Non-Medical): Not on file  Physical Activity:   . Days  of Exercise per Week: Not on file  . Minutes of Exercise per Session: Not on file  Stress:   . Feeling of Stress : Not on file  Social Connections:   . Frequency of Communication with Friends and Family: Not on file  . Frequency of Social Gatherings with Friends and Family: Not on file  . Attends Religious Services: Not on file  . Active Member of Clubs or Organizations: Not on file  . Attends Archivist Meetings: Not on file  . Marital Status: Not on file  Intimate Partner Violence:   . Fear of Current or Ex-Partner: Not on file  . Emotionally Abused: Not on file  . Physically Abused: Not on file  . Sexually Abused: Not on file    Review of Systems  All other systems reviewed and are negative.   PHYSICAL EXAMINATION:    BP 132/86 (Cuff Size: Large)   Pulse 70   Ht 5' 6.5" (1.689 m)   Wt 231 lb (104.8 kg)   LMP 02/15/2020 (Approximate)   SpO2 97%   BMI 36.73 kg/m     General appearance: alert, cooperative and appears stated age   Abdomen: soft, non-tender, no masses,  no organomegaly Right and left upper incisions with mild ecchymoses.  All incisions intact. Dermabond in place.    Pelvic:  Deferred.   ASSESSMENT  Doing well status post laparoscopic hysterectomy.   PLAN  Surgical findings, procedure, and pathology report reviewed.  No driving for 2 weeks from surgery date.  Fu for 6 week visit before returning to work.  She will need a return to work note that places her on light duty for a total of 3 months postop.

## 2020-04-07 ENCOUNTER — Encounter: Payer: Self-pay | Admitting: Obstetrics and Gynecology

## 2020-04-07 ENCOUNTER — Other Ambulatory Visit: Payer: Self-pay

## 2020-04-07 ENCOUNTER — Ambulatory Visit (INDEPENDENT_AMBULATORY_CARE_PROVIDER_SITE_OTHER): Payer: Federal, State, Local not specified - PPO | Admitting: Obstetrics and Gynecology

## 2020-04-07 VITALS — BP 132/86 | HR 70 | Ht 66.5 in | Wt 231.0 lb

## 2020-04-07 DIAGNOSIS — Z90710 Acquired absence of both cervix and uterus: Secondary | ICD-10-CM

## 2020-04-11 ENCOUNTER — Other Ambulatory Visit: Payer: Self-pay | Admitting: Internal Medicine

## 2020-04-12 NOTE — Discharge Summary (Signed)
Physician Discharge Summary  Patient ID: Caroline Wong MRN: [id number] DOB/AGE: June 03, 1961 59 y.o.  Admit date: 03/31/2020 Discharge date: 04/01/20 Admission Diagnoses:  1.  Abnormal perimenopausal bleeding 2.  Pelvic pain.  Discharge Diagnoses:  1.  Abnormal perimenopausal bleeding 2.  Pelvic pain. 3.  Status post total laparoscopic hysterectomy with bilateral salpingo-oophorectomy, cystoscopy  Active Problems:   Status post surgery   Status post laparoscopic hysterectomy   Discharged Condition: good  Hospital Course: The patient was admitted on 03/31/20 for a total laparoscopic hysterectomy with bilateral salpingo-oophorectomy, cystoscopy which were performed without complication while under general anesthesia.  The patient's post op course was uneventful.  Her pain was controlled with Percocet and Motrin on post op day zero when the patient began taking po well.  She ambulated independently and wore PAS and Ted hose for DVT prophylaxis while in bed.  She received Lovenox preoperatively.  The patient was not able to void initially post op, and she had an in and out catheterization. She was kept overnight due to the urinary retention.  Subsequently, she was able to void spontaneously. The patient's vital signs remained stable and she demonstrated no signs of infection during her hospitalization.  The patient's post op day zero Hgb was 13.1.   She was tolerating the this well.  She had very minimal vaginal bleeding, and her incisions demonstrated no signs of erythema or significant drainage.  She was found to be in good condition and ready for discharge on post op day one.  Consults: None  Significant Diagnostic Studies: labs: See hospital course.  Treatments: surgery:  Total laparoscopic hysterectomy with bilateral salpingo-oophorectomy, cystoscopy  Discharge Exam: Blood pressure (!) 154/84, pulse (!) 58, temperature 98.4 F (36.9 C), resp. rate 16, height 5' 6.5" (1.689 m),  weight 104.1 kg, last menstrual period 02/15/2020, SpO2 97 %. General: alert and cooperative Resp: clear to auscultation bilaterally Cardio: regular rate and rhythm, S1, S2 normal, no murmur, click, rub or gallop GI: soft, non-tender; bowel sounds normal; no masses,  no organomegaly and incision: clean, dry and intact Extremities: Ted hose on. Vaginal Bleeding: none  Disposition: Discharge disposition: 01-Home or Self Care     Discharge instructions were reviewed in verbal and written form.  Discharge Instructions    Discharge patient   Complete by: As directed    Discharge disposition: 01-Home or Self Care   Discharge patient date: 04/01/2020     Allergies as of 04/01/2020      Reactions   Bee Venom Swelling   Biaxin [clarithromycin] Swelling   Edema in joint Can take a Zpac ok   Amlodipine Swelling   redness   Betadine [povidone Iodine] Rash   Chloraprep One Step [chlorhexidine Gluconate] Rash   Duraprep [antiseptic Products, Misc.] Rash   Keflex [cephalexin] Rash      Medication List    STOP taking these medications   HYDROcodone-acetaminophen 5-325 MG tablet Commonly known as: NORCO/VICODIN   medroxyPROGESTERone 10 MG tablet Commonly known as: PROVERA   phentermine 37.5 MG tablet Commonly known as: ADIPEX-P     TAKE these medications   ascorbic acid 500 MG tablet Commonly known as: VITAMIN C Take 500 mg by mouth daily.   cyclobenzaprine 10 MG tablet Commonly known as: FLEXERIL Take 1 tablet (10 mg total) by mouth 3 (three) times daily as needed for muscle spasms.   diphenhydrAMINE 25 MG tablet Commonly known as: Benadryl Take 2 tablets (50 mg total) by mouth every 4 (four) hours as needed (  bee sting).   ibuprofen 800 MG tablet Commonly known as: ADVIL Take 1 tablet (800 mg total) by mouth every 8 (eight) hours as needed for mild pain or moderate pain. What changed:   medication strength  how much to take  how to take this  when to take  this  reasons to take this  additional instructions Notes to patient: Next dose is due at 11 am as needed for pain   metFORMIN 500 MG 24 hr tablet Commonly known as: GLUCOPHAGE-XR Take 1 tablet (500 mg total) by mouth daily with breakfast. 4 tablets at hs   Metoprolol Succinate 50 MG Cs24 Take 50 mg by mouth daily.   olmesartan 40 MG tablet Commonly known as: BENICAR TAKE 1 TABLET(40 MG) BY MOUTH DAILY What changed: See the new instructions.   omeprazole 40 MG capsule Commonly known as: PRILOSEC Take 1 capsule (40 mg total) by mouth daily.   ondansetron 4 MG disintegrating tablet Commonly known as: ZOFRAN-ODT DISSOLVE 1 TABLET(4 MG) ON THE TONGUE EVERY 8 HOURS AS NEEDED FOR NAUSEA OR VOMITING What changed:   how much to take  how to take this  when to take this  reasons to take this  additional instructions   oxyCODONE-acetaminophen 5-325 MG tablet Commonly known as: PERCOCET/ROXICET Take 1-2 tablets by mouth every 4 (four) hours as needed for moderate pain. Notes to patient: Next dose is due at 1230 pm as needed for pain   triamterene-hydrochlorothiazide 37.5-25 MG tablet Commonly known as: MAXZIDE-25 TAKE 1 TABLET BY MOUTH DAILY   Vitamin D3 125 MCG (5000 UT) Tabs Take 5,000 Units by mouth daily.   zinc gluconate 50 MG tablet Take 50 mg by mouth daily.       Follow-up Information    Nunzio Cobbs, MD In 1 week.   Specialty: Obstetrics and Gynecology Contact information: [address] [place] [address] [phone]               Signed: Arloa Koh 04/12/2020, 6:12 AM

## 2020-04-18 ENCOUNTER — Encounter: Payer: Self-pay | Admitting: Obstetrics and Gynecology

## 2020-04-20 ENCOUNTER — Telehealth: Payer: Self-pay

## 2020-04-20 ENCOUNTER — Encounter: Payer: Self-pay | Admitting: Obstetrics and Gynecology

## 2020-04-20 NOTE — Telephone Encounter (Signed)
Patient is 3 weeks status post Lap.Hyst. She is complaining of sore area on left lower incision which is very tender, warm to touch. She denies any fever or drainage. Made appointment to see Dr.Silva tomorrow at 4:00pm to arrive at 3:45pm.

## 2020-04-20 NOTE — Telephone Encounter (Signed)
Patient is calling to follow up with mychart message regarding post op incision.  See mychart message (04/18/20).

## 2020-04-21 ENCOUNTER — Telehealth: Payer: Self-pay

## 2020-04-21 ENCOUNTER — Ambulatory Visit: Payer: Federal, State, Local not specified - PPO | Admitting: Obstetrics and Gynecology

## 2020-04-21 NOTE — Telephone Encounter (Signed)
Patient cancelled work in incision check this afternoon because of car issue. She says she will go to urgent care if need to.

## 2020-04-21 NOTE — Telephone Encounter (Signed)
Routing to Dr Quincy Simmonds, please advise   Ok to close encounter?

## 2020-04-21 NOTE — Progress Notes (Deleted)
GYNECOLOGY  VISIT   HPI: 59 y.o.   Married  Caucasian  female   G2P2002 with Patient's last menstrual period was 02/15/2020 (approximate).   here for incision check 3 weeks status post TOTAL LAPAROSCOPIC HYSTERECTOMY WITH BILATERAL SALPINGECTOMY POSSIBLE BILATERAL OOPHORECTOMY (N/A ) CYSTOSCOPY (N/A ).  GYNECOLOGIC HISTORY: Patient's last menstrual period was 02/15/2020 (approximate). Contraception:  Hyst Menopausal hormone therapy:  none Last mammogram: 12-26-19 density b birads 1:neg  Last pap smear:  1-5-2021Neg:Neg HR HPV, 10-24-17 Neg:Neg Hr HPV, 08-19-15 Neg:Neg HR HPV        OB History    Gravida  2   Para  2   Term  2   Preterm      AB      Living  2     SAB      TAB      Ectopic      Multiple      Live Births  2              Patient Active Problem List   Diagnosis Date Noted  . Status post surgery 03/31/2020  . Status post laparoscopic hysterectomy 03/31/2020  . Acute viral syndrome 11/27/2019  . Nausea 11/27/2019  . Hyperglycemia 09/10/2019  . Body mass index (BMI) 35.0-35.9, adult 08/22/2019  . Endometrial polyp   . Asthmatic bronchitis 01/30/2019  . Acute infectious diarrhea 11/27/2018  . Colon cancer screening 11/14/2018  . Ganglion cyst of volar aspect of right wrist   . Allergic rhinitis 06/12/2018  . Panic attacks 02/06/2018  . Elevated glucose 02/06/2018  . Petechial rash 01/05/2018  . Epistaxis 01/05/2018  . Screening for colorectal cancer 10/24/2017  . Encounter for gynecological examination with Papanicolaou smear of cervix 10/24/2017  . Cough 06/12/2017  . Upper respiratory tract infection 06/12/2017  . Anxiety 11/14/2016  . UTI (urinary tract infection) 11/11/2015  . Diarrhea 11/11/2015  . GERD (gastroesophageal reflux disease) 10/23/2015  . BP check 09/24/2015  . Urge incontinence 08/19/2015  . Stye external 07/15/2015  . S/P cervical spinal fusion 06/05/2015  . Acute sinusitis 05/20/2015  . Fatigue 05/13/2015  . Edema  04/01/2015  . Chest pain 04/01/2015  . Nonspecific abnormal electrocardiogram (ECG) (EKG) 04/01/2015  . 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 Neck pain, bilateral posterior 12/31/2014  . Lower back pain 12/31/2014  . Yeast infection 05/07/2014  . Obesity 05/07/2014  . Night sweats 04/14/2014  . Abdominal pain 11/25/2013  . Hormone replacement therapy (HRT) 08/21/2013  . Well adult exam 05/16/2013  . Hypertension     Past Medical History:  Diagnosis Date  . Abdominal pain 11/25/2013   Has some nausea associated with pain that has been on and off x 2 weeks will get [place]  . Anxiety    Takes Lorazepam  . Cancer ([place])    skin Ca. removed from chest  . Chronic bronchitis ([place])   . Diabetes mellitus without complication ([place])   . Elevated serum creatinine   . Endometrial polyp   . Gallstones   . GERD (gastroesophageal reflux disease)   . Hormone replacement therapy (HRT)   . Hypertension   . Night sweats 04/14/2014  . Obesity 05/07/2014  . STD (sexually transmitted disease)    HSV ?carrier--never had an outbreak but exposed from ex-husband  . Urge incontinence 08/19/2015  . Yeast infection 05/07/2014    Past Surgical History:  Procedure Laterality Date  . ANTERIOR CERVICAL DECOMP/DISCECTOMY FUSION N/A 06/05/2015   Procedure: C5-6, C6-7 Anterior Cervical Discectomy  and Fusion, Allograft, Plate;  Surgeon: [name], MD;  Location: [place];  Service: Orthopedics;  Laterality: N/A;  . ANTERIOR FUSION CERVICAL SPINE  06/05/2015   C 5 6 7    . CHOLECYSTECTOMY    . COLONOSCOPY  2015   NO POLYPS  . CYSTOSCOPY N/A 03/31/2020   Procedure: CYSTOSCOPY;  Surgeon: Nunzio Cobbs, MD;  [facility];  Service: Gynecology;  Laterality: N/A;  . FOOT SURGERY Left   . GANGLION CYST EXCISION Right 07/09/2018   Procedure: EXCISION, RIGHT VOLAR WRIST GANGLION;  Surgeon: [name], MD;  Location: [place];  Service: Orthopedics;  Laterality:  Right;  . MOLE REMOVAL    . TOTAL LAPAROSCOPIC HYSTERECTOMY WITH BILATERAL SALPINGO OOPHORECTOMY N/A 03/31/2020   Procedure: TOTAL LAPAROSCOPIC HYSTERECTOMY WITH BILATERAL SALPINGECTOMY POSSIBLE BILATERAL OOPHORECTOMY;  Surgeon: Nunzio Cobbs, MD;  Location: [facility];  Service: Gynecology;  Laterality: N/A;  PLEASE have Technicare as prep solution.  Patient is allergic to Betadine, CHG, and Duraprep.  . WISDOM TOOTH EXTRACTION      Current Outpatient Medications  Medication Sig Dispense Refill  . metoprolol succinate (TOPROL-XL) 50 MG 24 hr tablet TAKE 1 TABLET BY MOUTH EVERY DAY. TAKE WITH OR IMMEDIATELY FOLLOWING A MEAL 90 tablet 1  . triamterene-hydrochlorothiazide (MAXZIDE-25) 37.5-25 MG tablet TAKE 1 TABLET BY MOUTH DAILY 90 tablet 1  . ascorbic acid (VITAMIN C) 500 MG tablet Take 500 mg by mouth daily.    . Cholecalciferol (VITAMIN D3) 5000 units TABS Take 5,000 Units by mouth daily.     . cyclobenzaprine (FLEXERIL) 10 MG tablet Take 1 tablet (10 mg total) by mouth 3 (three) times daily as needed for muscle spasms. (Patient not taking: Reported on 04/07/2020) 60 tablet 0  . diphenhydrAMINE (BENADRYL) 25 MG tablet Take 2 tablets (50 mg total) by mouth every 4 (four) hours as needed (bee sting). (Patient not taking: Reported on 03/12/2020) 30 tablet 3  . ibuprofen (ADVIL) 800 MG tablet Take 1 tablet (800 mg total) by mouth every 8 (eight) hours as needed for mild pain or moderate pain. 30 tablet 0  . LORazepam (ATIVAN) 0.5 MG tablet TAKE 1 TABLET(0.5 MG) BY MOUTH AT BEDTIME AS NEEDED 30 tablet 1  . metFORMIN (GLUCOPHAGE-XR) 500 MG 24 hr tablet Take 1 tablet (500 mg total) by mouth daily with breakfast. 4 tablets at hs 90 tablet 3  . Metoprolol Succinate 50 MG CS24 Take 50 mg by mouth daily.     [name] olmesartan (BENICAR) 40 MG tablet TAKE 1 TABLET(40 MG) BY MOUTH DAILY (Patient taking differently: Take 40 mg by mouth daily. ) 90 tablet 1  . omeprazole (PRILOSEC) 40 MG  capsule Take 1 capsule (40 mg total) by mouth daily. 90 capsule 3  . ondansetron (ZOFRAN-ODT) 4 MG disintegrating tablet DISSOLVE 1 TABLET(4 MG) ON THE TONGUE EVERY 8 HOURS AS NEEDED FOR NAUSEA OR VOMITING (Patient taking differently: Take 4 mg by mouth every 8 (eight) hours as needed for nausea or vomiting. ) 20 tablet 1  . oxyCODONE-acetaminophen (PERCOCET/ROXICET) 5-325 MG tablet Take 1-2 tablets by mouth every 4 (four) hours as needed for moderate pain. 20 tablet 0  . zinc gluconate 50 MG tablet Take 50 mg by mouth daily.     No current facility-administered medications for this visit.   Facility-Administered Medications Ordered in Other Visits  Medication Dose Route Frequency Provider Last Rate Last Admin  . aminophylline injection 75  mg  75 mg Intravenous BID PRN Dorothy Spark, MD   75 mg at 04/13/15 1037     ALLERGIES: Bee venom; Biaxin [clarithromycin]; Amlodipine; Betadine [povidone iodine]; Chloraprep one step [chlorhexidine gluconate]; Duraprep [antiseptic products, misc.]; and Keflex [cephalexin]  Family History  Problem Relation Age of Onset  . Breast cancer Mother 3       breast  . Diabetes Mother   . Fibromyalgia Mother   . Heart disease Mother        CHF  . Hypertension Mother   . Kidney disease Mother   . Heart disease Father   . Emphysema Father   . Diabetes Brother   . Hypertension Brother        x 2  . Heart disease Maternal Grandmother   . Diabetes Maternal Grandmother   . Asthma Maternal Grandmother   . Arthritis Maternal Grandmother   . Heart disease Maternal Grandfather   . Arthritis Maternal Grandfather   . Cancer Maternal Grandfather        bladder  . Breast cancer Paternal Grandmother   . Heart disease Paternal Grandmother   . Arthritis Paternal Grandmother   . Stroke Paternal Grandmother   . Ovarian cancer Paternal Grandmother   . Diabetes Paternal Grandfather   . Heart disease Paternal Grandfather   . Colon cancer Paternal Grandfather         mets  . Arthritis Paternal Grandfather   . Stomach cancer Paternal Grandfather        mets to stomach  . Hypertension Brother   . Cancer Sister        ?endometrial ca  . Breast cancer Maternal Aunt   . Breast cancer Paternal Aunt   . Esophageal cancer Neg Hx   . Pancreatic cancer Neg Hx   . Rectal cancer Neg Hx     Social History   Socioeconomic History  . Marital status: Married    Spouse name: [name]  . Number of children: 4  . Years of education: college  . Highest education level: Not on file  Occupational History  . 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 Smoking status: Never Smoker  . Smokeless tobacco: Never Used  Vaping Use  . Vaping Use: Never used  Substance and Sexual Activity  . Alcohol use: Yes    Alcohol/week: 1.0 standard drink    Types: 1 Glasses of wine per week    Comment: occas.  . Drug use: No  . Sexual activity: Yes    Birth control/protection: None, Post-menopausal  Other Topics Concern  . Not on file  Social History Narrative   Married 2 sons 2 daughters. SUPERVISOR at [employer]   One caffeinated beverage daily   This was updated 12/12/2013   Social Determinants of Health   Financial Resource Strain:   . Difficulty of Paying Living Expenses: Not on file  Food Insecurity:   . Worried About Charity fundraiser in the Last Year: Not on file  . Ran Out of Food in the Last Year: Not on file  Transportation Needs:   . Lack of Transportation (Medical): Not on file  . Lack of Transportation (Non-Medical): Not on file  Physical Activity:   . Days of Exercise per Week: Not on file  . Minutes of Exercise per Session: Not on file  Stress:   . Feeling of Stress : Not on file  Social Connections:   . Frequency  of Communication with Friends and Family: Not on file  . Frequency of Social Gatherings with Friends and Family: Not on file  . Attends Religious Services: Not on file  . Active  Member of Clubs or Organizations: Not on file  . Attends Archivist Meetings: Not on file  . Marital Status: Not on file  Intimate Partner Violence:   . Fear of Current or Ex-Partner: Not on file  . Emotionally Abused: Not on file  . Physically Abused: Not on file  . Sexually Abused: Not on file    Review of Systems  PHYSICAL EXAMINATION:    LMP 02/15/2020 (Approximate)     General appearance: alert, cooperative and appears stated age Head: Normocephalic, without obvious abnormality, atraumatic Neck: no adenopathy, supple, symmetrical, trachea midline and thyroid normal to inspection and palpation Lungs: clear to auscultation bilaterally Breasts: normal appearance, no masses or tenderness, No nipple retraction or dimpling, No nipple discharge or bleeding, No axillary or supraclavicular adenopathy Heart: regular rate and rhythm Abdomen: soft, non-tender, no masses,  no organomegaly Extremities: extremities normal, atraumatic, no cyanosis or edema Skin: Skin color, texture, turgor normal. No rashes or lesions Lymph nodes: Cervical, supraclavicular, and axillary nodes normal. No abnormal inguinal nodes palpated Neurologic: Grossly normal  Pelvic: External genitalia:  no lesions              Urethra:  normal appearing urethra with no masses, tenderness or lesions              Bartholins and Skenes: normal                 Vagina: normal appearing vagina with normal color and discharge, no lesions              Cervix: no lesions                Bimanual Exam:  Uterus:  normal size, contour, position, consistency, mobility, non-tender              Adnexa: no mass, fullness, tenderness              Rectal exam: {yes no:[id number]}.  Confirms.              Anus:  normal sphincter tone, no lesions  Chaperone was present for exam.  ASSESSMENT     PLAN     An After Visit Summary was printed and given to the patient.  ______ minutes face to face time of which over 50% was  spent in counseling.

## 2020-04-22 NOTE — Telephone Encounter (Signed)
Encounter reviewed and closed.  

## 2020-04-29 NOTE — Progress Notes (Signed)
GYNECOLOGY  VISIT   HPI: 59 y.o.   Married  Caucasian  female   G2P2002 with Patient's last menstrual period was 02/15/2020 (approximate).   here for 6 weeks status post TOTAL LAPAROSCOPIC HYSTERECTOMY WITH BILATERAL SALPINGECTOMY POSSIBLE BILATERAL OOPHORECTOMY (N/A ) CYSTOSCOPY (N/A ).     States she she feels tightness around her bladder.  Patient complaining of severe hot flashes--they make her terribly nauseated. Night times hot flashes are the most prominent.   Estroven not working.   Took Gabapentin in the past around the time of foot and neck surgery.   Took Effexor in the past for hot flashes, and it altered her mood.   FH of breast cancer on both maternal and paternal sides of family.   GYNECOLOGIC HISTORY: Patient's last menstrual period was 02/15/2020 (approximate). Contraception:  Hyst/Bil.Salpingectomy/BSO Menopausal hormone therapy:none Last mammogram:  12-26-19 density b birads 1:neg Last pap smear: 1-5-2021Neg:Neg HR HPV, 10-24-17 Neg:Neg Hr HPV, 08-19-15 Neg:Neg HR HPV        OB History    Gravida  2   Para  2   Term  2   Preterm      AB      Living  2     SAB      TAB      Ectopic      Multiple      Live Births  2              Patient Active Problem List   Diagnosis Date Noted  . Status post surgery 03/31/2020  . Status post laparoscopic hysterectomy 03/31/2020  . Acute viral syndrome 11/27/2019  . Nausea 11/27/2019  . Hyperglycemia 09/10/2019  . Body mass index (BMI) 35.0-35.9, adult 08/22/2019  . Endometrial polyp   . Asthmatic bronchitis 01/30/2019  . Acute infectious diarrhea 11/27/2018  . Colon cancer screening 11/14/2018  . Ganglion cyst of volar aspect of right wrist   . Allergic rhinitis 06/12/2018  . Panic attacks 02/06/2018  . Elevated glucose 02/06/2018  . Petechial rash 01/05/2018  . Epistaxis 01/05/2018  . Screening for colorectal cancer 10/24/2017  . Encounter for gynecological examination with Papanicolaou  smear of cervix 10/24/2017  . Cough 06/12/2017  . Upper respiratory tract infection 06/12/2017  . Anxiety 11/14/2016  . UTI (urinary tract infection) 11/11/2015  . Diarrhea 11/11/2015  . GERD (gastroesophageal reflux disease) 10/23/2015  . BP check 09/24/2015  . Urge incontinence 08/19/2015  . Stye external 07/15/2015  . S/P cervical spinal fusion 06/05/2015  . Acute sinusitis 05/20/2015  . Fatigue 05/13/2015  . Edema 04/01/2015  . Chest pain 04/01/2015  . Nonspecific abnormal electrocardiogram (ECG) (EKG) 04/01/2015  . 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 Neck pain, bilateral posterior 12/31/2014  . Lower back pain 12/31/2014  . Yeast infection 05/07/2014  . Obesity 05/07/2014  . Night sweats 04/14/2014  . Abdominal pain 11/25/2013  . Hormone replacement therapy (HRT) 08/21/2013  . Well adult exam 05/16/2013  . Hypertension     Past Medical History:  Diagnosis Date  . Abdominal pain 11/25/2013   Has some nausea associated with pain that has been on and off x 2 weeks will get [place]  . Anxiety    Takes Lorazepam  . Cancer ([place])    skin Ca. removed from chest  . Chronic bronchitis ([place])   . Diabetes mellitus without complication ([place])   . Elevated serum creatinine   . Endometrial polyp   . Gallstones   . GERD (gastroesophageal reflux  disease)   . Hormone replacement therapy (HRT)   . Hypertension   . Night sweats 04/14/2014  . Obesity 05/07/2014  . STD (sexually transmitted disease)    HSV ?carrier--never had an outbreak but exposed from ex-husband  . Urge incontinence 08/19/2015  . Yeast infection 05/07/2014    Past Surgical History:  Procedure Laterality Date  . ANTERIOR CERVICAL DECOMP/DISCECTOMY FUSION N/A 06/05/2015   Procedure: C5-6, C6-7 Anterior Cervical Discectomy and Fusion, Allograft, Plate;  Surgeon: [name], MD;  Location: [place];  Service: Orthopedics;  Laterality: N/A;  . ANTERIOR FUSION CERVICAL SPINE  06/05/2015   C 5 6 7    . CHOLECYSTECTOMY     . COLONOSCOPY  2015   NO POLYPS  . CYSTOSCOPY N/A 03/31/2020   Procedure: CYSTOSCOPY;  Surgeon: Nunzio Cobbs, MD;  Location[facility];  Service: Gynecology;  Laterality: N/A;  . FOOT SURGERY Left   . GANGLION CYST EXCISION Right 07/09/2018   Procedure: EXCISION, RIGHT VOLAR WRIST GANGLION;  Surgeon: [name], MD;  Location: [place];  Service: Orthopedics;  Laterality: Right;  . MOLE REMOVAL    . TOTAL LAPAROSCOPIC HYSTERECTOMY WITH BILATERAL SALPINGO OOPHORECTOMY N/A 03/31/2020   Procedure: TOTAL LAPAROSCOPIC HYSTERECTOMY WITH BILATERAL SALPINGECTOMY POSSIBLE BILATERAL OOPHORECTOMY;  Surgeon: Nunzio Cobbs, MD;  Location[facility];  Service: Gynecology;  Laterality: N/A;  PLEASE have Technicare as prep solution.  Patient is allergic to Betadine, CHG, and Duraprep.  . WISDOM TOOTH EXTRACTION      Current Outpatient Medications  Medication Sig Dispense Refill  . ascorbic acid (VITAMIN C) 500 MG tablet Take 500 mg by mouth daily.    . Cholecalciferol (VITAMIN D3) 5000 units TABS Take 5,000 Units by mouth daily.     . cyclobenzaprine (FLEXERIL) 10 MG tablet Take 1 tablet (10 mg total) by mouth 3 (three) times daily as needed for muscle spasms. 60 tablet 0  . diphenhydrAMINE (BENADRYL) 25 MG tablet Take 2 tablets (50 mg total) by mouth every 4 (four) hours as needed (bee sting). 30 tablet 3  . LORazepam (ATIVAN) 0.5 MG tablet TAKE 1 TABLET(0.5 MG) BY MOUTH AT BEDTIME AS NEEDED 30 tablet 1  . metFORMIN (GLUCOPHAGE-XR) 500 MG 24 hr tablet Take 1 tablet (500 mg total) by mouth daily with breakfast. 4 tablets at hs 90 tablet 3  . metoprolol succinate (TOPROL-XL) 50 MG 24 hr tablet TAKE 1 TABLET BY MOUTH EVERY DAY. TAKE WITH OR IMMEDIATELY FOLLOWING A MEAL 90 tablet 1  . Metoprolol Succinate 50 MG CS24 Take 50 mg by mouth daily.     [name] olmesartan (BENICAR) 40 MG tablet TAKE 1 TABLET(40 MG) BY MOUTH DAILY (Patient taking  differently: Take 40 mg by mouth daily. ) 90 tablet 1  . omeprazole (PRILOSEC) 40 MG capsule Take 1 capsule (40 mg total) by mouth daily. 90 capsule 3  . ondansetron (ZOFRAN-ODT) 4 MG disintegrating tablet DISSOLVE 1 TABLET(4 MG) ON THE TONGUE EVERY 8 HOURS AS NEEDED FOR NAUSEA OR VOMITING (Patient taking differently: Take 4 mg by mouth every 8 (eight) hours as needed for nausea or vomiting. ) 20 tablet 1  . triamterene-hydrochlorothiazide (MAXZIDE-25) 37.5-25 MG tablet TAKE 1 TABLET BY MOUTH DAILY 90 tablet 1  . zinc gluconate 50 MG tablet Take 50 mg by mouth daily.     No current facility-administered medications for this visit.   Facility-Administered Medications Ordered in Other Visits  Medication Dose Route Frequency  Provider Last Rate Last Admin  . aminophylline injection 75 mg  75 mg Intravenous BID PRN Dorothy Spark, MD   75 mg at 04/13/15 1037     ALLERGIES: Bee venom; Biaxin [clarithromycin]; Amlodipine; Betadine [povidone iodine]; Chloraprep one step [chlorhexidine gluconate]; Duraprep [antiseptic products, misc.]; and Keflex [cephalexin]  Family History  Problem Relation Age of Onset  . Breast cancer Mother 89       breast  . Diabetes Mother   . Fibromyalgia Mother   . Heart disease Mother        CHF  . Hypertension Mother   . Kidney disease Mother   . Heart disease Father   . Emphysema Father   . Diabetes Brother   . Hypertension Brother        x 2  . Heart disease Maternal Grandmother   . Diabetes Maternal Grandmother   . Asthma Maternal Grandmother   . Arthritis Maternal Grandmother   . Heart disease Maternal Grandfather   . Arthritis Maternal Grandfather   . Cancer Maternal Grandfather        bladder  . Breast cancer Paternal Grandmother   . Heart disease Paternal Grandmother   . Arthritis Paternal Grandmother   . Stroke Paternal Grandmother   . Ovarian cancer Paternal Grandmother   . Diabetes Paternal Grandfather   . Heart disease Paternal Grandfather    . Colon cancer Paternal Grandfather        mets  . Arthritis Paternal Grandfather   . Stomach cancer Paternal Grandfather        mets to stomach  . Hypertension Brother   . Cancer Sister        ?endometrial ca  . Breast cancer Maternal Aunt   . Breast cancer Paternal Aunt   . Esophageal cancer Neg Hx   . Pancreatic cancer Neg Hx   . Rectal cancer Neg Hx     Social History   Socioeconomic History  . Marital status: Married    Spouse name: [name]  . Number of children: 4  . Years of education: college  . Highest education level: Not on file  Occupational History  . 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 Smoking status: Never Smoker  . Smokeless tobacco: Never Used  Vaping Use  . Vaping Use: Never used  Substance and Sexual Activity  . Alcohol use: Yes    Alcohol/week: 1.0 standard drink    Types: 1 Glasses of wine per week    Comment: occas.  . Drug use: No  . Sexual activity: Yes    Birth control/protection: None, Post-menopausal  Other Topics Concern  . Not on file  Social History Narrative   Married 2 sons 2 daughters. SUPERVISOR at [employer]   One caffeinated beverage daily   This was updated 12/12/2013   Social Determinants of Health   Financial Resource Strain:   . Difficulty of Paying Living Expenses: Not on file  Food Insecurity:   . Worried About Charity fundraiser in the Last Year: Not on file  . Ran Out of Food in the Last Year: Not on file  Transportation Needs:   . Lack of Transportation (Medical): Not on file  . Lack of Transportation (Non-Medical): Not on file  Physical Activity:   . Days of Exercise per Week: Not on file  . Minutes of Exercise per Session: Not on file  Stress:   . Feeling of Stress :  Not on file  Social Connections:   . Frequency of Communication with Friends and Family: Not on file  . Frequency of Social Gatherings with Friends and Family: Not on file  . Attends  Religious Services: Not on file  . Active Member of Clubs or Organizations: Not on file  . Attends Archivist Meetings: Not on file  . Marital Status: Not on file  Intimate Partner Violence:   . Fear of Current or Ex-Partner: Not on file  . Emotionally Abused: Not on file  . Physically Abused: Not on file  . Sexually Abused: Not on file    Review of Systems  All other systems reviewed and are negative.   PHYSICAL EXAMINATION:    BP 134/80 (Cuff Size: Large)   Pulse 72   Wt 227 lb (103 kg) Comment: per patient  LMP 02/15/2020 (Approximate)   SpO2 96%   BMI 36.09 kg/m     General appearance: alert, cooperative and appears stated age   Abdomen: umbilical incision with 1 mm opening of skin, treated with H2O2 and then silver nitrate.  Other incision with skin intact.   Abdomen is soft, non-tender, no masses,  no organomegaly.  Chaperone was present for exam.  ASSESSMENT  Status post total laparoscopic hysterectomy, bilateral salpingo-oophorectomy, cystoscopy.  Granulation tissue of the umbilical incision. Hot flashes.  Strong FH of breast cancer.   PLAN  We discussed options for treating menopausal symptoms - herbal remedies, SSRI/SNRI, Neurontin, and estrogen therapy. Patient will start Neurontin - 100 mg at hs and increase to 300 mg at hs.  Side effects discussed.  FU in 5 weeks for a recheck.  She is not able to lift 70 pounds at work, so she will not return until after her visit on 06/12/19.

## 2020-05-04 ENCOUNTER — Encounter: Payer: Self-pay | Admitting: Obstetrics and Gynecology

## 2020-05-07 ENCOUNTER — Other Ambulatory Visit: Payer: Self-pay

## 2020-05-07 ENCOUNTER — Encounter: Payer: Self-pay | Admitting: Obstetrics and Gynecology

## 2020-05-07 ENCOUNTER — Ambulatory Visit (INDEPENDENT_AMBULATORY_CARE_PROVIDER_SITE_OTHER): Payer: Federal, State, Local not specified - PPO | Admitting: Obstetrics and Gynecology

## 2020-05-07 VITALS — BP 134/80 | HR 72 | Wt 227.0 lb

## 2020-05-07 DIAGNOSIS — Z9889 Other specified postprocedural states: Secondary | ICD-10-CM

## 2020-05-07 DIAGNOSIS — N951 Menopausal and female climacteric states: Secondary | ICD-10-CM

## 2020-05-07 MED ORDER — GABAPENTIN 100 MG PO CAPS: ORAL_CAPSULE | ORAL | 2 refills | Status: DC

## 2020-05-08 ENCOUNTER — Encounter: Payer: Self-pay | Admitting: Obstetrics and Gynecology

## 2020-05-12 ENCOUNTER — Ambulatory Visit: Payer: Federal, State, Local not specified - PPO | Admitting: Obstetrics and Gynecology

## 2020-05-15 ENCOUNTER — Encounter: Payer: Self-pay | Admitting: Obstetrics and Gynecology

## 2020-05-19 ENCOUNTER — Telehealth: Payer: Self-pay

## 2020-05-19 NOTE — Telephone Encounter (Signed)
Conni Slipper [facility] Did you get the fmla papers I sent in my last message?

## 2020-05-20 NOTE — Telephone Encounter (Signed)
Spoke with patient regarding FMLA paperwork. Informed patient that forms sent over were not for medical clearance. Patient stated that she is on the way to the funeral home to make funeral arrangements for her mother's passing. Patient stated that she would contact her HR department regarding correct forms to be sent over, but it would not be today. Informed patient to let us know how we can assist, when she is ready.  Encounter closed.

## 2020-06-02 ENCOUNTER — Telehealth: Payer: Self-pay

## 2020-06-02 NOTE — Telephone Encounter (Signed)
Patient has question about fmla paperwork and the length of time that is on form for her to be out.

## 2020-06-08 NOTE — Telephone Encounter (Signed)
I explained to patient I couldn't help her with this problem and asked Caroline Wong could find a slot for her. Patient scheduled 06/11/20 @8 :30am

## 2020-06-08 NOTE — Telephone Encounter (Signed)
Called patient to reschedule AEX due to office scheduling changes and times. Patient declined appointment for same day earlier time and declined appointment for any other day. Patient states she needed to come for AEX to "get ok to go back to work and will go back to work on Monday (06/15/20) regardless". Patient needs to be called back by nurse.   Routing to [facility] for [facility] paperwork and triage for advice.

## 2020-06-09 ENCOUNTER — Ambulatory Visit: Payer: Federal, State, Local not specified - PPO | Admitting: Internal Medicine

## 2020-06-10 NOTE — Progress Notes (Signed)
60 y.o. G3P2002 Married Caucasian female here for annual exam.    Taking Neurontin 300 mg nightly for hot flashes and doing well to control symptoms.  She wants to reduce down to 100 mg.   Mother passed 05-19-20 with Covid.  PCP: Sula Soda, MD    Patient's last menstrual period was 02/15/2020 (approximate).           Sexually active: Not currently The current method of family planning is status post hysterectomy/Bil.salpingectomy.    Exercising: No.  The patient does not participate in regular exercise at present. Smoker:  no  Health Maintenance: Pap: 06-11-19 Neg:Neg HR HPV, 10-24-17 Neg:Neg HR HPV, 08-19-15 Neg:Neg HR HPV  History of abnormal Pap:  no MMG:  12-26-19 3D/Neg/density B/BiRads1 Colonoscopy:  03-03-14 normal;next 10 years BMD:   n/a  Result  n/a TDaP: 04-05-15 Gardasil:   no HIV: Years ago  Neg Hep C: 10-20-17 Neg Screening Labs:  PCP.   reports that she has never smoked. She has never used smokeless tobacco. She reports current alcohol use. She reports that she does not use drugs.  Past Medical History:  Diagnosis Date   Abdominal pain 11/25/2013   Has some nausea associated with pain that has been on and off x 2 weeks will get [place]   Anxiety    Takes Lorazepam   Cancer (HCC)    skin Ca. removed from chest   Chronic bronchitis (HCC)    Diabetes mellitus without complication (HCC)    Elevated serum creatinine    Endometrial polyp    Gallstones    GERD (gastroesophageal reflux disease)    Hormone replacement therapy (HRT)    Hypertension    Night sweats 04/14/2014   Obesity 05/07/2014   STD (sexually transmitted disease)    HSV ?carrier--never had an outbreak but exposed from ex-husband   Urge incontinence 08/19/2015   Yeast infection 05/07/2014    Past Surgical History:  Procedure Laterality Date   ANTERIOR CERVICAL DECOMP/DISCECTOMY FUSION N/A 06/05/2015   Procedure: C5-6, C6-7 Anterior Cervical Discectomy and Fusion, Allograft, Plate;   Surgeon: Eldred Manges, MD;  Location: [facility] OR;  Service: Orthopedics;  Laterality: N/A;   ANTERIOR FUSION CERVICAL SPINE  06/05/2015   C 5 6 7     CHOLECYSTECTOMY     COLONOSCOPY  2015   NO POLYPS   CYSTOSCOPY N/A 03/31/2020   Procedure: CYSTOSCOPY;  Surgeon: 04/02/2020, MD;  Location[facility];  Service: Gynecology;  Laterality: N/A;   FOOT SURGERY Left    GANGLION CYST EXCISION Right 07/09/2018   Procedure: EXCISION, RIGHT VOLAR WRIST GANGLION;  Surgeon: 09/07/2018, MD;  Location: [facility];  Service: Orthopedics;  Laterality: Right;   MOLE REMOVAL     TOTAL LAPAROSCOPIC HYSTERECTOMY WITH BILATERAL SALPINGO OOPHORECTOMY N/A 03/31/2020   Procedure: TOTAL LAPAROSCOPIC HYSTERECTOMY WITH BILATERAL SALPINGECTOMY POSSIBLE BILATERAL OOPHORECTOMY;  Surgeon: 04/02/2020, MD;  Location: [facility];  Service: Gynecology;  Laterality: N/A;  PLEASE have [employer] as prep solution.  Patient is allergic to Betadine, CHG, and Duraprep.   WISDOM TOOTH EXTRACTION      Current Outpatient Medications  Medication Sig Dispense Refill   ascorbic acid (VITAMIN C) 500 MG tablet Take 500 mg by mouth daily.     Cholecalciferol (VITAMIN D3) 5000 units TABS Take 5,000 Units by mouth daily.      cyclobenzaprine (FLEXERIL) 10 MG tablet Take 1 tablet (10 mg total) by  mouth 3 (three) times daily as needed for muscle spasms. 60 tablet 0   diphenhydrAMINE (BENADRYL) 25 MG tablet Take 2 tablets (50 mg total) by mouth every 4 (four) hours as needed (bee sting). 30 tablet 3   gabapentin (NEURONTIN) 100 MG capsule Take one capsule (100 mg) by mouth at bed time for three days. If you are tolerating this, increase to 2 capsules (200 mg) by mouth at bed time for three days.  Then increase to 3 capsules (300 mg) by mouth at bed time. 90 capsule 2   LORazepam (ATIVAN) 0.5 MG tablet TAKE 1 TABLET(0.5 MG) BY MOUTH AT BEDTIME AS NEEDED 30 tablet  1   metFORMIN (GLUCOPHAGE-XR) 500 MG 24 hr tablet Take 1 tablet (500 mg total) by mouth daily with breakfast. 4 tablets at hs 90 tablet 3   metoprolol succinate (TOPROL-XL) 50 MG 24 hr tablet TAKE 1 TABLET BY MOUTH EVERY DAY. TAKE WITH OR IMMEDIATELY FOLLOWING A MEAL 90 tablet 1   Metoprolol Succinate 50 MG CS24 Take 50 mg by mouth daily.      olmesartan (BENICAR) 40 MG tablet TAKE 1 TABLET(40 MG) BY MOUTH DAILY (Patient taking differently: Take 40 mg by mouth daily.) 90 tablet 1   omeprazole (PRILOSEC) 40 MG capsule Take 1 capsule (40 mg total) by mouth daily. 90 capsule 3   ondansetron (ZOFRAN-ODT) 4 MG disintegrating tablet DISSOLVE 1 TABLET(4 MG) ON THE TONGUE EVERY 8 HOURS AS NEEDED FOR NAUSEA OR VOMITING (Patient taking differently: Take 4 mg by mouth every 8 (eight) hours as needed for nausea or vomiting.) 20 tablet 1   triamterene-hydrochlorothiazide (MAXZIDE-25) 37.5-25 MG tablet TAKE 1 TABLET BY MOUTH DAILY 90 tablet 1   zinc gluconate 50 MG tablet Take 50 mg by mouth daily.     No current facility-administered medications for this visit.   Facility-Administered Medications Ordered in Other Visits  Medication Dose Route Frequency Provider Last Rate Last Admin   aminophylline injection 75 mg  75 mg Intravenous BID PRN Dorothy Spark, MD   75 mg at 04/13/15 1037    Family History  Problem Relation Age of Onset   Breast cancer Mother 10       breast   Diabetes Mother    Fibromyalgia Mother    Heart disease Mother        CHF   Hypertension Mother    Kidney disease Mother    Other Mother        COVID   Heart disease Father    Emphysema Father    Diabetes Brother    Hypertension Brother        x 2   Heart disease Maternal Grandmother    Diabetes Maternal Grandmother    Asthma Maternal Grandmother    Arthritis Maternal Grandmother    Heart disease Maternal Grandfather    Arthritis Maternal Grandfather    Cancer Maternal Grandfather         bladder   Breast cancer Paternal Grandmother    Heart disease Paternal Grandmother    Arthritis Paternal Grandmother    Stroke Paternal Grandmother    Ovarian cancer Paternal Grandmother    Diabetes Paternal Grandfather    Heart disease Paternal Grandfather    Colon cancer Paternal Grandfather        mets   Arthritis Paternal Grandfather    Stomach cancer Paternal Grandfather        mets to stomach   Hypertension Brother    Cancer Sister        ?  endometrial ca   Breast cancer Maternal Aunt    Breast cancer Paternal Aunt    Esophageal cancer Neg Hx    Pancreatic cancer Neg Hx    Rectal cancer Neg Hx     Review of Systems  All other systems reviewed and are negative.   Exam:   BP 140/82 (Cuff Size: Large)    Pulse 71    Ht 5\' 7"  (1.702 m)    Wt 233 lb (105.7 kg)    LMP 02/15/2020 (Approximate)    SpO2 97%    BMI 36.49 kg/m     General appearance: alert, cooperative and appears stated age Head: normocephalic, without obvious abnormality, atraumatic Neck: no adenopathy, supple, symmetrical, trachea midline and thyroid normal to inspection and palpation Lungs: clear to auscultation bilaterally Breasts: normal appearance, no masses or tenderness, No nipple retraction or dimpling, No nipple discharge or bleeding, No axillary adenopathy Heart: regular rate and rhythm Abdomen: soft, non-tender; no masses, no organomegaly Extremities: extremities normal, atraumatic, no cyanosis or edema Skin: skin color, texture, turgor normal. No rashes or lesions Lymph nodes: cervical, supraclavicular, and axillary nodes normal. Neurologic: grossly normal  Pelvic: External genitalia:  no lesions              No abnormal inguinal nodes palpated.              Urethra:  normal appearing urethra with no masses, tenderness or lesions              Bartholins and Skenes: normal                 Vagina: normal appearing vagina with normal color and discharge, no lesions               Cervix: absent              Pap taken: No. Bimanual Exam:  Uterus: absent.  Left apex feels like it is still healing.                Adnexa: no mass, fullness, tenderness              Rectal exam: Yes.  .  Confirms.              Anus:  normal sphincter tone, no lesions  Chaperone was present for exam:  Marisa Sprinkles  Assessment:   Well woman visit with normal exam. Status post TLH with BSO.  Menopausal symptoms treated with Neurontin.  FH breast cancer in mother.   Plan: Mammogram screening discussed. Self breast awareness reviewed. Pap and HR HPV as above. Guidelines for Calcium, Vitamin D, regular exercise program including cardiovascular and weight bearing exercise. Refill of Neurontin 100 mg nightly.  She will let me know if she needs to increase again. We discussed OTC treatments and possible vaginal estrogen for vaginal dryness. I did review potential effect of vaginal estrogen use on breast cancer.  She will choose OTC remedies for now.  Resume work on 07-12-19 with return to all normal activities.  Ok to resume sexual activity in 2 weeks.  Follow up annually and prn.

## 2020-06-11 ENCOUNTER — Ambulatory Visit: Payer: Federal, State, Local not specified - PPO | Admitting: Obstetrics and Gynecology

## 2020-06-11 ENCOUNTER — Ambulatory Visit: Payer: Federal, State, Local not specified - PPO | Admitting: Internal Medicine

## 2020-06-11 ENCOUNTER — Other Ambulatory Visit: Payer: Self-pay

## 2020-06-11 ENCOUNTER — Encounter: Payer: Self-pay | Admitting: Internal Medicine

## 2020-06-11 ENCOUNTER — Encounter: Payer: Self-pay | Admitting: Obstetrics and Gynecology

## 2020-06-11 ENCOUNTER — Other Ambulatory Visit: Payer: Self-pay | Admitting: Internal Medicine

## 2020-06-11 VITALS — BP 140/82 | HR 71 | Ht 67.0 in | Wt 233.0 lb

## 2020-06-11 VITALS — BP 118/72 | HR 60 | Temp 98.1°F | Wt 233.0 lb

## 2020-06-11 DIAGNOSIS — Z01419 Encounter for gynecological examination (general) (routine) without abnormal findings: Secondary | ICD-10-CM

## 2020-06-11 DIAGNOSIS — R739 Hyperglycemia, unspecified: Secondary | ICD-10-CM

## 2020-06-11 DIAGNOSIS — J4521 Mild intermittent asthma with (acute) exacerbation: Secondary | ICD-10-CM

## 2020-06-11 DIAGNOSIS — I10 Essential (primary) hypertension: Secondary | ICD-10-CM | POA: Diagnosis not present

## 2020-06-11 LAB — BASIC METABOLIC PANEL
BUN: 24 mg/dL — ABNORMAL HIGH (ref 6–23)
CO2: 28 mEq/L (ref 19–32)
Calcium: 9.7 mg/dL (ref 8.4–10.5)
Chloride: 100 mEq/L (ref 96–112)
Creatinine, Ser: 1.14 mg/dL (ref 0.40–1.20)
GFR: 53.57 mL/min — ABNORMAL LOW (ref >60.00)
Glucose, Bld: 85 mg/dL (ref 70–99)
Potassium: 3.9 mEq/L (ref 3.5–5.1)
Sodium: 138 mEq/L (ref 135–145)

## 2020-06-11 LAB — HEMOGLOBIN A1C: Hgb A1c MFr Bld: 6.4 % (ref 4.6–6.5)

## 2020-06-11 MED ORDER — PROMETHAZINE-CODEINE 6.25-10 MG/5ML PO SYRP
5.0000 mL | ORAL_SOLUTION | ORAL | 0 refills | Status: DC | PRN
Start: 2020-06-11 — End: 2020-11-11

## 2020-06-11 MED ORDER — FLUCONAZOLE 150 MG PO TABS: 150.0000 mg | ORAL_TABLET | Freq: Once | ORAL | 1 refills | Status: AC

## 2020-06-11 MED ORDER — AZITHROMYCIN 250 MG PO TABS: ORAL_TABLET | ORAL | 0 refills | Status: DC

## 2020-06-11 MED ORDER — GABAPENTIN 100 MG PO CAPS: ORAL_CAPSULE | ORAL | 3 refills | Status: DC

## 2020-06-11 MED ORDER — LORAZEPAM 0.5 MG PO TABS
ORAL_TABLET | ORAL | 2 refills | Status: DC
Start: 2020-06-11 — End: 2020-09-10

## 2020-06-11 NOTE — Addendum Note (Signed)
Addended by: Ebony Cargo on: 06/11/2020 11:28 AM   Modules accepted: Orders

## 2020-06-11 NOTE — Assessment & Plan Note (Addendum)
Acute bronchitis Rx - Zpac and Diflucan The pt refused COVID19 vaccination

## 2020-06-11 NOTE — Progress Notes (Signed)
Subjective:  Patient ID: Caroline Wong, female    DOB: 1961/05/07  Age: 60 y.o. MRN: [id number]  CC: Medication Refill (Ativan)   HPI Caroline Wong presents for DM, LBP, HTN f/u Pt just lost her mother to UTI/ COVID - May 19, 2020 C/o cough/ bronchitis  The pt refused COVID19 vaccination  Outpatient Medications Prior to Visit  Medication Sig Dispense Refill  . ascorbic acid (VITAMIN C) 500 MG tablet Take 500 mg by mouth daily.    . Cholecalciferol (VITAMIN D3) 5000 units TABS Take 5,000 Units by mouth daily.     . cyclobenzaprine (FLEXERIL) 10 MG tablet Take 1 tablet (10 mg total) by mouth 3 (three) times daily as needed for muscle spasms. 60 tablet 0  . diphenhydrAMINE (BENADRYL) 25 MG tablet Take 2 tablets (50 mg total) by mouth every 4 (four) hours as needed (bee sting). 30 tablet 3  . gabapentin (NEURONTIN) 100 MG capsule Take one capsule (100 mg) by mouth at bed time. 90 capsule 3  . LORazepam (ATIVAN) 0.5 MG tablet TAKE 1 TABLET(0.5 MG) BY MOUTH AT BEDTIME AS NEEDED 30 tablet 1  . metFORMIN (GLUCOPHAGE-XR) 500 MG 24 hr tablet Take 1 tablet (500 mg total) by mouth daily with breakfast. 4 tablets at hs 90 tablet 3  . metoprolol succinate (TOPROL-XL) 50 MG 24 hr tablet TAKE 1 TABLET BY MOUTH EVERY DAY. TAKE WITH OR IMMEDIATELY FOLLOWING A MEAL 90 tablet 1  . Metoprolol Succinate 50 MG CS24 Take 50 mg by mouth daily.     [name] olmesartan (BENICAR) 40 MG tablet TAKE 1 TABLET(40 MG) BY MOUTH DAILY (Patient taking differently: Take 40 mg by mouth daily.) 90 tablet 1  . omeprazole (PRILOSEC) 40 MG capsule Take 1 capsule (40 mg total) by mouth daily. 90 capsule 3  . ondansetron (ZOFRAN-ODT) 4 MG disintegrating tablet DISSOLVE 1 TABLET(4 MG) ON THE TONGUE EVERY 8 HOURS AS NEEDED FOR NAUSEA OR VOMITING (Patient taking differently: Take 4 mg by mouth every 8 (eight) hours as needed for nausea or vomiting.) 20 tablet 1  . triamterene-hydrochlorothiazide (MAXZIDE-25) 37.5-25 MG tablet TAKE 1  TABLET BY MOUTH DAILY 90 tablet 1  . zinc gluconate 50 MG tablet Take 50 mg by mouth daily.     Facility-Administered Medications Prior to Visit  Medication Dose Route Frequency Provider Last Rate Last Admin  . aminophylline injection 75 mg  75 mg Intravenous BID PRN Lars Masson, MD   75 mg at 04/13/15 1037    ROS: Review of Systems  Constitutional: Negative for activity change, appetite change, chills, fatigue and unexpected weight change.  HENT: Negative for congestion, mouth sores and sinus pressure.   Eyes: Negative for visual disturbance.  Respiratory: Negative for cough and chest tightness.   Gastrointestinal: Negative for abdominal pain and nausea.  Genitourinary: Negative for difficulty urinating, frequency and vaginal pain.  Musculoskeletal: Negative for back pain and gait problem.  Skin: Negative for pallor and rash.  Neurological: Negative for dizziness, tremors, weakness, numbness and headaches.  Psychiatric/Behavioral: Negative for confusion and sleep disturbance.    Objective:  BP 118/72 (BP Location: Left Arm)   Pulse 60   Temp 98.1 F (36.7 C) (Oral)   Wt 233 lb (105.7 kg)   LMP 02/15/2020 (Approximate)   SpO2 97%   BMI 36.49 kg/m   BP Readings from Last 3 Encounters:  06/11/20 118/72  06/11/20 140/82  05/07/20 134/80    Wt Readings from Last 3 Encounters:  06/11/20 233  lb (105.7 kg)  06/11/20 233 lb (105.7 kg)  05/07/20 227 lb (103 kg)    Physical Exam Constitutional:      General: She is not in acute distress.    Appearance: She is well-developed. She is obese.  HENT:     Head: Normocephalic.     Right Ear: External ear normal.     Left Ear: External ear normal.     Nose: Nose normal.     Mouth/Throat:     Mouth: Oropharynx is clear and moist.  Eyes:     General:        Right eye: No discharge.        Left eye: No discharge.     Conjunctiva/sclera: Conjunctivae normal.     Pupils: Pupils are equal, round, and reactive to light.   Neck:     Thyroid: No thyromegaly.     Vascular: No JVD.     Trachea: No tracheal deviation.  Cardiovascular:     Rate and Rhythm: Normal rate and regular rhythm.     Heart sounds: Normal heart sounds.  Pulmonary:     Effort: No respiratory distress.     Breath sounds: No stridor. No wheezing.  Abdominal:     General: Bowel sounds are normal. There is no distension.     Palpations: Abdomen is soft. There is no mass.     Tenderness: There is no abdominal tenderness. There is no guarding or rebound.  Musculoskeletal:        General: No tenderness or edema.     Cervical back: Normal range of motion and neck supple.  Lymphadenopathy:     Cervical: No cervical adenopathy.  Skin:    Findings: No erythema or rash.  Neurological:     Cranial Nerves: No cranial nerve deficit.     Motor: No abnormal muscle tone.     Coordination: Coordination normal.     Deep Tendon Reflexes: Reflexes normal.  Psychiatric:        Mood and Affect: Mood and affect normal.        Behavior: Behavior normal.        Thought Content: Thought content normal.        Judgment: Judgment normal.     Lab Results  Component Value Date   WBC 11.0 (H) 03/31/2020   HGB 13.1 03/31/2020   HCT 40.0 03/31/2020   PLT 242 03/31/2020   GLUCOSE 183 (H) 04/01/2020   CHOL 165 03/20/2019   TRIG 148.0 03/20/2019   HDL 47.90 03/20/2019   LDLCALC 88 03/20/2019   ALT 33 03/10/2020   AST 23 03/10/2020   NA 138 04/01/2020   K 4.3 04/01/2020   CL 105 04/01/2020   CREATININE 1.11 (H) 04/01/2020   BUN 25 (H) 04/01/2020   CO2 25 04/01/2020   TSH 0.95 09/10/2019   INR 0.9 02/06/2018   HGBA1C 6.8 (H) 03/10/2020    No results found.  Assessment & Plan:    Sonda Primes, MD

## 2020-06-11 NOTE — Patient Instructions (Signed)

## 2020-06-11 NOTE — Assessment & Plan Note (Signed)
On Metformen BMET A1c

## 2020-06-11 NOTE — Assessment & Plan Note (Signed)
BP ok On Olmesartan, Toprol XL 1/2 tab qd 12.5 mg, Maxzide

## 2020-06-30 ENCOUNTER — Ambulatory Visit: Payer: Federal, State, Local not specified - PPO | Admitting: Internal Medicine

## 2020-06-30 ENCOUNTER — Other Ambulatory Visit: Payer: Self-pay | Admitting: Internal Medicine

## 2020-07-03 ENCOUNTER — Encounter: Payer: Self-pay | Admitting: Obstetrics and Gynecology

## 2020-07-15 ENCOUNTER — Other Ambulatory Visit: Payer: Self-pay | Admitting: Internal Medicine

## 2020-08-09 IMAGING — CT CT RENAL STONE PROTOCOL
2 of 4 series · 16 of 46 positions shown, 18 images · non-contrast
Comparison: 12/09/2015

CLINICAL DATA: Lower back pain. Diarrhea and chills. Renal stone
disease suspected.

EXAM:
CT ABDOMEN AND PELVIS WITHOUT CONTRAST
TECHNIQUE: Multidetector CT imaging of the abdomen and pelvis was performed
following the standard protocol without IV contrast.

[Series 2: axial st · axial · 0.81mm/px · z∈[-667,-217]mm · 13 of 102 slices shown, 15 images]
[im 6/102  soft-tissue]
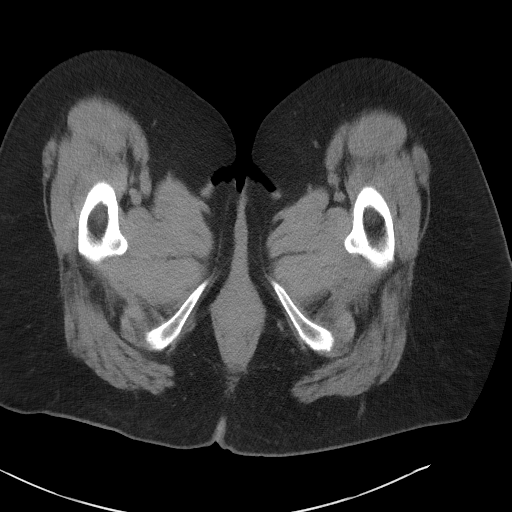
[im 6/102  bone]
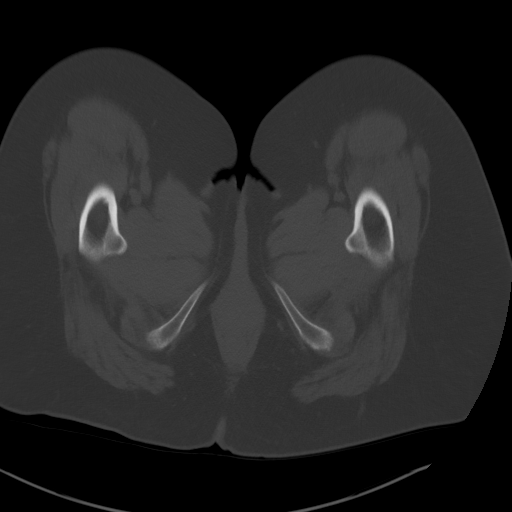
[im 16/102  soft-tissue]
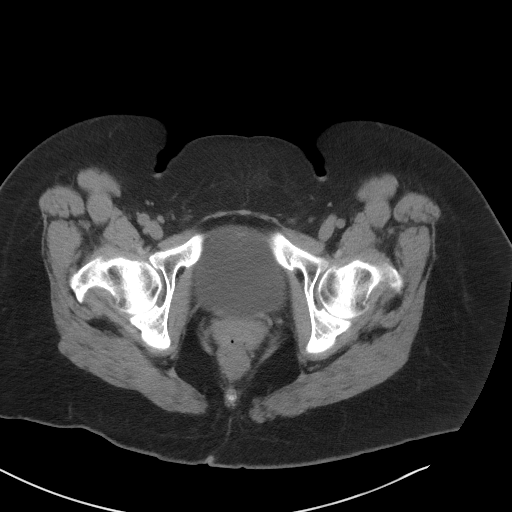
[im 21/102  soft-tissue]
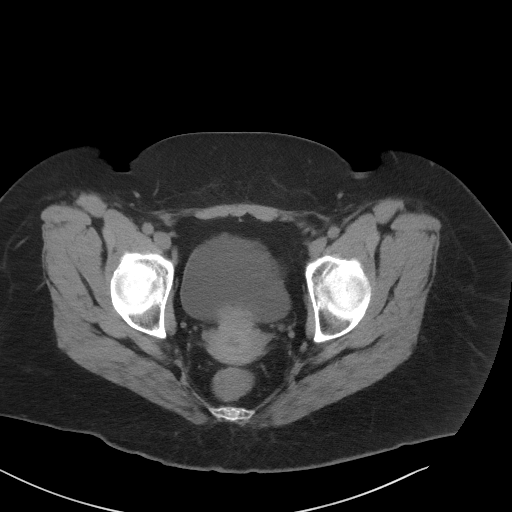
[im 31/102  soft-tissue]
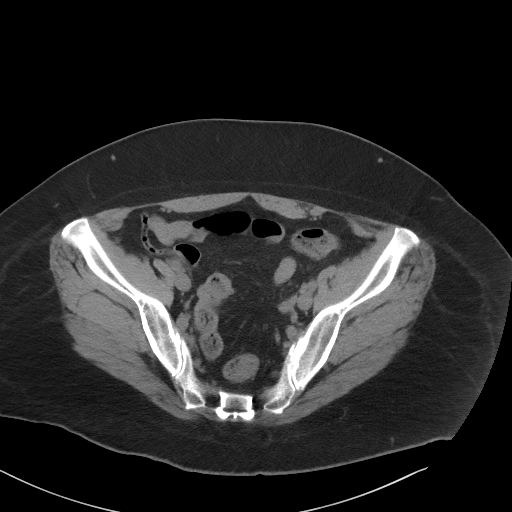
[im 36/102  soft-tissue]
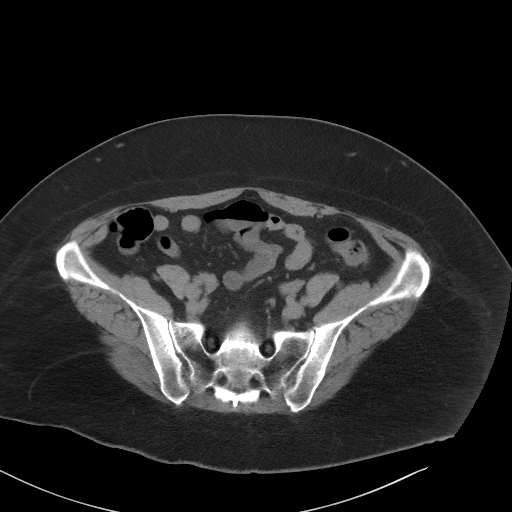
[im 46/102  soft-tissue]
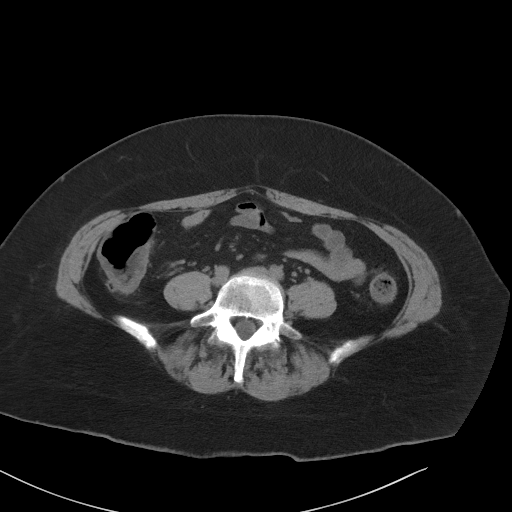
[im 51/102  soft-tissue]
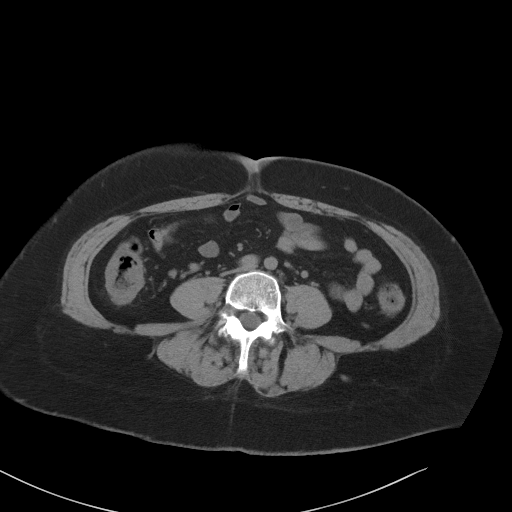
[im 56/102  soft-tissue]
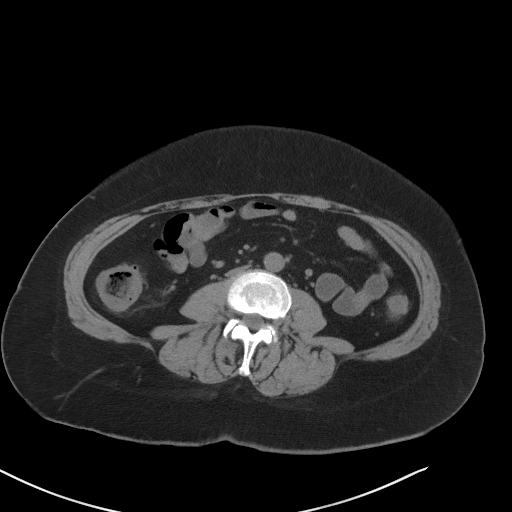
[im 66/102  soft-tissue]
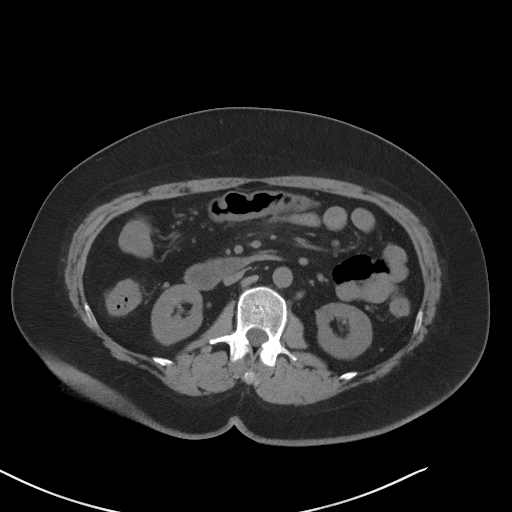
[im 66/102  bone]
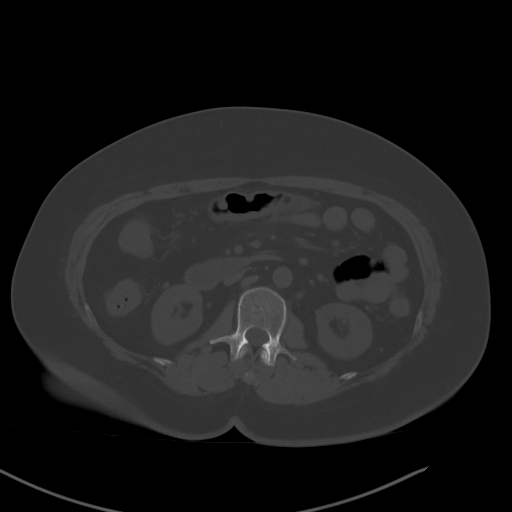
[im 71/102  soft-tissue]
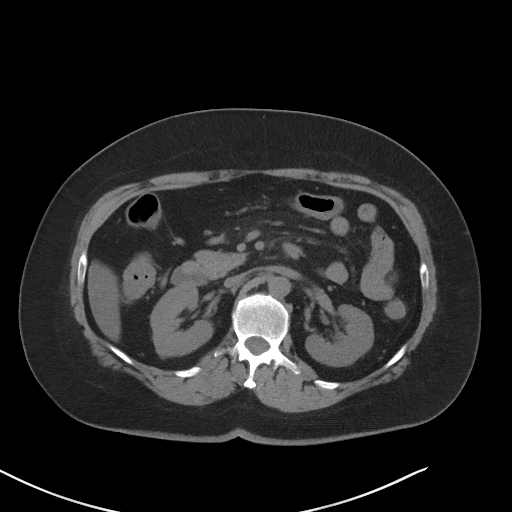
[im 81/102  soft-tissue]
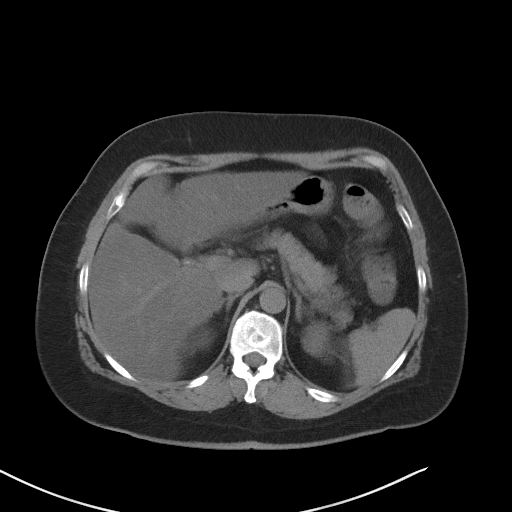
[im 86/102  soft-tissue]
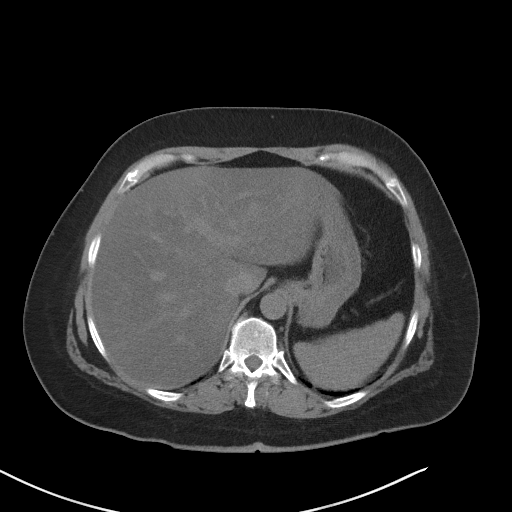
[im 96/102  soft-tissue]
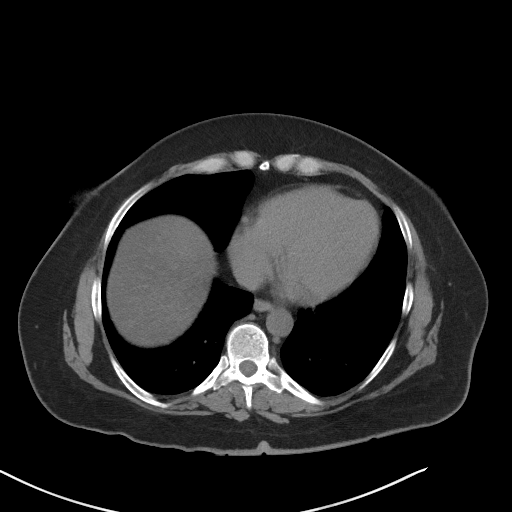

[Series 5: coronal st · coronal · 0.86mm/px · 3 of 98 slices shown]
[im 33/98  soft-tissue]
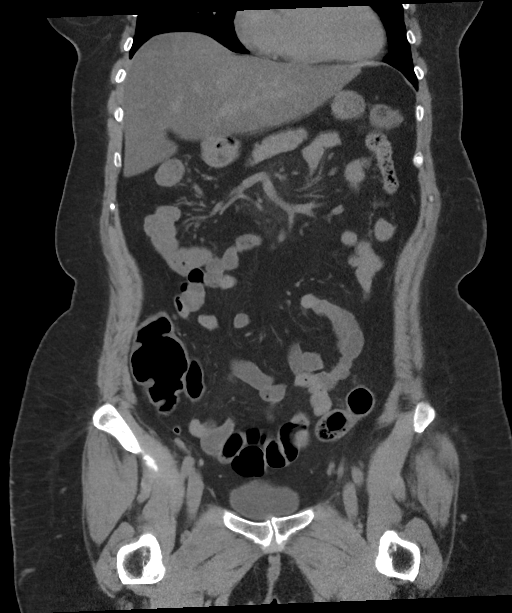
[im 44/98  soft-tissue]
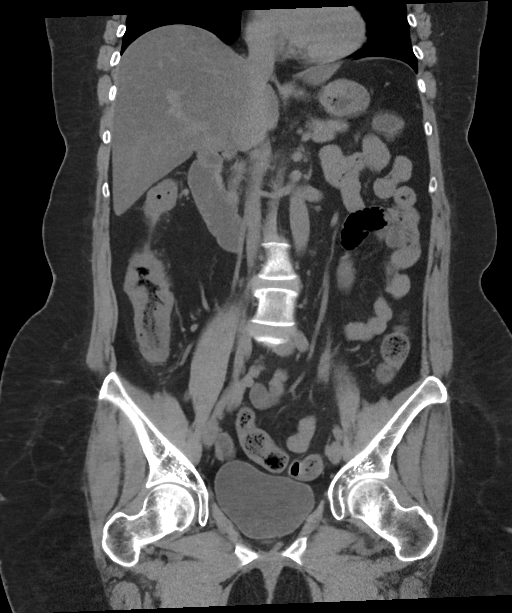
[im 54/98  soft-tissue]
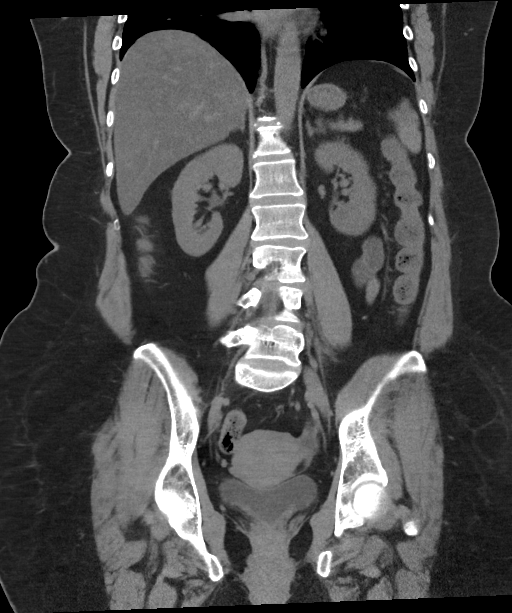

[16 of 46 positions shown; findings below may reference images not displayed]

FINDINGS: Lower chest: Mild patchy density at the lung bases could be
atelectasis or mild patchy bronchopneumonia. No dense consolidation
or lobar collapse. No pleural effusion.

Hepatobiliary: Diffuse fatty change of the liver. No evidence of
focal lesion. Previous cholecystectomy.

Pancreas: Normal

Spleen: Normal

Adrenals/Urinary Tract: Adrenal glands are normal. Both kidneys are
normal. No cyst, mass, stone or hydronephrosis. No stone in either
ureter. No stone in the bladder.

Stomach/Bowel: No small bowel abnormality seen. The appendix is
normal. There appears to be wall thickening of the ascending colon
suggesting colitis. Surrounding fat is slightly edematous. There are
a few small diverticula in the region. No sign of perforation or
abscess. Left: May show mild wall thickening, but that is not as
suggestive. Few scattered sigmoid diverticula.

Vascular/Lymphatic: Aortic atherosclerosis. No aneurysm. IVC is
normal. No retroperitoneal adenopathy.

Reproductive: No pelvic mass.

Other: No free fluid or air.

Musculoskeletal: Mild lumbar degenerative changes. Old mild superior
endplate depression at T12.
IMPRESSION: Colitis, particularly of the right colon. This could be infectious
or related to inflammatory bowel disease. No evidence of perforation
or abscess.

Diffuse fatty liver.

Mild patchy density at the lung bases could be atelectasis or mild
bronchopneumonia

No evidence of urinary tract pathology.

## 2020-08-10 ENCOUNTER — Encounter: Payer: Self-pay | Admitting: Obstetrics and Gynecology

## 2020-08-11 MED ORDER — GABAPENTIN 100 MG PO CAPS
ORAL_CAPSULE | ORAL | 9 refills | Status: DC
Start: 2020-08-11 — End: 2021-12-06

## 2020-08-11 NOTE — Telephone Encounter (Signed)
Beaver for patient to continue Gabapentin 100 mg, take two (200 mg) by mouth daily at bedtime.  Disp:  60 RF:  9  If she prefers a 90 day prescription, I can give her refills until she is due to have her annual exam in January, 2023.

## 2020-09-08 ENCOUNTER — Other Ambulatory Visit: Payer: Self-pay | Admitting: Internal Medicine

## 2020-09-08 ENCOUNTER — Ambulatory Visit: Payer: Federal, State, Local not specified - PPO | Admitting: Internal Medicine

## 2020-10-12 ENCOUNTER — Other Ambulatory Visit: Payer: Self-pay | Admitting: Internal Medicine

## 2020-10-15 ENCOUNTER — Other Ambulatory Visit: Payer: Self-pay | Admitting: Internal Medicine

## 2020-10-16 ENCOUNTER — Other Ambulatory Visit: Payer: Self-pay | Admitting: Internal Medicine

## 2020-10-23 ENCOUNTER — Other Ambulatory Visit: Payer: Self-pay | Admitting: Internal Medicine

## 2020-11-06 ENCOUNTER — Ambulatory Visit: Payer: Federal, State, Local not specified - PPO | Admitting: Internal Medicine

## 2020-11-11 ENCOUNTER — Ambulatory Visit: Payer: Federal, State, Local not specified - PPO | Admitting: Internal Medicine

## 2020-11-11 ENCOUNTER — Other Ambulatory Visit: Payer: Self-pay

## 2020-11-11 ENCOUNTER — Encounter: Payer: Self-pay | Admitting: Internal Medicine

## 2020-11-11 DIAGNOSIS — E6609 Other obesity due to excess calories: Secondary | ICD-10-CM | POA: Diagnosis not present

## 2020-11-11 DIAGNOSIS — J0100 Acute maxillary sinusitis, unspecified: Secondary | ICD-10-CM | POA: Diagnosis not present

## 2020-11-11 DIAGNOSIS — Z6831 Body mass index (BMI) 31.0-31.9, adult: Secondary | ICD-10-CM

## 2020-11-11 DIAGNOSIS — R42 Dizziness and giddiness: Secondary | ICD-10-CM | POA: Insufficient documentation

## 2020-11-11 DIAGNOSIS — R7309 Other abnormal glucose: Secondary | ICD-10-CM

## 2020-11-11 LAB — COMPREHENSIVE METABOLIC PANEL
ALT: 33 U/L (ref 0–35)
AST: 24 U/L (ref 0–37)
Albumin: 4.2 g/dL (ref 3.5–5.2)
Alkaline Phosphatase: 91 U/L (ref 39–117)
BUN: 33 mg/dL — ABNORMAL HIGH (ref 6–23)
CO2: 30 mEq/L (ref 19–32)
Calcium: 9.5 mg/dL (ref 8.4–10.5)
Chloride: 104 mEq/L (ref 96–112)
Creatinine, Ser: 1.31 mg/dL — ABNORMAL HIGH (ref 0.40–1.20)
GFR: 45.2 mL/min — ABNORMAL LOW (ref >60.00)
Glucose, Bld: 106 mg/dL — ABNORMAL HIGH (ref 70–99)
Potassium: 3.9 mEq/L (ref 3.5–5.1)
Sodium: 141 mEq/L (ref 135–145)
Total Bilirubin: 0.5 mg/dL (ref 0.2–1.2)
Total Protein: 7.7 g/dL (ref 6.0–8.3)

## 2020-11-11 LAB — HEMOGLOBIN A1C: Hgb A1c MFr Bld: 6.9 % — ABNORMAL HIGH (ref 4.6–6.5)

## 2020-11-11 MED ORDER — MECLIZINE HCL 12.5 MG PO TABS: 12.5000 mg | ORAL_TABLET | Freq: Three times a day (TID) | ORAL | 1 refills | Status: AC | PRN

## 2020-11-11 MED ORDER — DOXYCYCLINE HYCLATE 100 MG PO TABS: 100.0000 mg | ORAL_TABLET | Freq: Two times a day (BID) | ORAL | 0 refills | Status: DC

## 2020-11-11 NOTE — Assessment & Plan Note (Signed)
Wt Readings from Last 3 Encounters:  11/11/20 228 lb (103.4 kg)  06/11/20 233 lb (105.7 kg)  06/11/20 233 lb (105.7 kg)  Pt lost wt on wt watchers

## 2020-11-11 NOTE — Assessment & Plan Note (Signed)
DOXY PO IF WORSE

## 2020-11-11 NOTE — Assessment & Plan Note (Addendum)
  Benign Positional Vertigo symptoms on the left. Start Meclizine. Start Laruth Bouchard - Daroff exercise several times a day as dirrected.  OFF WORK X 1 WK, NO DRIVING

## 2020-11-11 NOTE — Progress Notes (Signed)
Subjective:  Patient ID: Caroline Wong, female    DOB: Nov 08, 1960  Age: 60 y.o. MRN: [id number]  CC: Follow-up   HPI Caroline Wong presents for ?sinusitis sx's and dizziness off and on x 1 week  Outpatient Medications Prior to Visit  Medication Sig Dispense Refill  . albuterol (VENTOLIN HFA) 108 (90 Base) MCG/ACT inhaler INHALE 2 PUFFS INTO THE LUNGS EVERY 6 HOURS AS NEEDED FOR WHEEZING OR SHORTNESS OF BREATH 18 g 3  . ascorbic acid (VITAMIN C) 500 MG tablet Take 500 mg by mouth daily.    . Cholecalciferol (VITAMIN D3) 5000 units TABS Take 5,000 Units by mouth daily.     . cyclobenzaprine (FLEXERIL) 10 MG tablet Take 1 tablet (10 mg total) by mouth 3 (three) times daily as needed for muscle spasms. 60 tablet 0  . diphenhydrAMINE (BENADRYL) 25 MG tablet Take 2 tablets (50 mg total) by mouth every 4 (four) hours as needed (bee sting). 30 tablet 3  . gabapentin (NEURONTIN) 100 MG capsule Take 2 tablets by mouth daily at bedtime. 60 capsule 9  . LORazepam (ATIVAN) 0.5 MG tablet TAKE 1 TABLET(0.5 MG) BY MOUTH AT BEDTIME AS NEEDED 30 tablet 2  . metFORMIN (GLUCOPHAGE-XR) 500 MG 24 hr tablet Take 1 tablet (500 mg total) by mouth daily with breakfast. 4 tablets at hs 90 tablet 3  . metoprolol succinate (TOPROL-XL) 50 MG 24 hr tablet TAKE 1 TABLET BY MOUTH EVERY DAY. TAKE WITH OR IMMEDIATELY FOLLOWING A MEAL 90 tablet 1  . olmesartan (BENICAR) 40 MG tablet TAKE 1 TABLET(40 MG) BY MOUTH DAILY 90 tablet 3  . omeprazole (PRILOSEC) 40 MG capsule Take 1 capsule (40 mg total) by mouth daily. 90 capsule 3  . ondansetron (ZOFRAN-ODT) 4 MG disintegrating tablet DISSOLVE 1 TABLET(4 MG) ON THE TONGUE EVERY 8 HOURS AS NEEDED FOR NAUSEA OR VOMITING 20 tablet 1  . phentermine (ADIPEX-P) 37.5 MG tablet TAKE 1 TABLET(37.5 MG) BY MOUTH DAILY BEFORE AND BREAKFAST.  Schedule office visit every 3 months 90 tablet 0  . triamterene-hydrochlorothiazide (MAXZIDE-25) 37.5-25 MG tablet Take 1 tablet by mouth daily.  Annual appt due in Oct must see provider for future refills 90 tablet 1  . zinc gluconate 50 MG tablet Take 50 mg by mouth daily.    [name] azithromycin (ZITHROMAX Z-PAK) 250 MG tablet As directed (Patient not taking: Reported on 11/11/2020) 6 tablet 0  . Metoprolol Succinate 50 MG CS24 Take 50 mg by mouth daily.     . promethazine-codeine (PHENERGAN WITH CODEINE) 6.25-10 MG/5ML syrup Take 5 mLs by mouth every 4 (four) hours as needed. (Patient not taking: Reported on 11/11/2020) 300 mL 0   Facility-Administered Medications Prior to Visit  Medication Dose Route Frequency Provider Last Rate Last Admin  . aminophylline injection 75 mg  75 mg Intravenous BID PRN Dorothy Spark, MD   75 mg at 04/13/15 1037    ROS: Review of Systems  Constitutional: Negative for activity change, appetite change, chills, fatigue and unexpected weight change.  HENT: Positive for postnasal drip, sinus pressure and sinus pain. Negative for congestion and mouth sores.   Eyes: Negative for visual disturbance.  Respiratory: Negative for cough and chest tightness.   Gastrointestinal: Negative for abdominal pain and nausea.  Genitourinary: Negative for difficulty urinating, frequency and vaginal pain.  Musculoskeletal: Negative for back pain and gait problem.  Skin: Negative for pallor and rash.  Neurological: Positive for dizziness. Negative for tremors, weakness, numbness and headaches.  Psychiatric/Behavioral:  Negative for confusion and sleep disturbance.    Objective:  BP 118/76 (BP Location: Left Arm)   Pulse 64   Temp 98.5 F (36.9 C) (Oral)   Ht 5\' 7"  (1.702 m)   Wt 228 lb (103.4 kg) Comment: per patient  LMP 02/15/2020 (Approximate)   SpO2 98%   BMI 35.71 kg/m   BP Readings from Last 3 Encounters:  11/11/20 118/76  06/11/20 118/72  06/11/20 140/82    Wt Readings from Last 3 Encounters:  11/11/20 228 lb (103.4 kg)  06/11/20 233 lb (105.7 kg)  06/11/20 233 lb (105.7 kg)    Physical  Exam Constitutional:      General: She is not in acute distress.    Appearance: She is well-developed. She is obese.  HENT:     Head: Normocephalic.     Right Ear: External ear normal.     Left Ear: External ear normal.     Nose: Nose normal.  Eyes:     General:        Right eye: No discharge.        Left eye: No discharge.     Conjunctiva/sclera: Conjunctivae normal.     Pupils: Pupils are equal, round, and reactive to light.  Neck:     Thyroid: No thyromegaly.     Vascular: No JVD.     Trachea: No tracheal deviation.  Cardiovascular:     Rate and Rhythm: Normal rate and regular rhythm.     Heart sounds: Normal heart sounds.  Pulmonary:     Effort: No respiratory distress.     Breath sounds: No stridor. No wheezing.  Abdominal:     General: Bowel sounds are normal. There is no distension.     Palpations: Abdomen is soft. There is no mass.     Tenderness: There is no abdominal tenderness. There is no guarding or rebound.  Musculoskeletal:        General: No tenderness.     Cervical back: Normal range of motion and neck supple.  Lymphadenopathy:     Cervical: No cervical adenopathy.  Skin:    Findings: No erythema or rash.  Neurological:     Cranial Nerves: No cranial nerve deficit.     Motor: No abnormal muscle tone.     Coordination: Coordination normal.     Deep Tendon Reflexes: Reflexes normal.  Psychiatric:        Behavior: Behavior normal.        Thought Content: Thought content normal.        Judgment: Judgment normal.      H-P (+/-) on the L Lab Results  Component Value Date   WBC 11.0 (H) 03/31/2020   HGB 13.1 03/31/2020   HCT 40.0 03/31/2020   PLT 242 03/31/2020   GLUCOSE 85 06/11/2020   CHOL 165 03/20/2019   TRIG 148.0 03/20/2019   HDL 47.90 03/20/2019   LDLCALC 88 03/20/2019   ALT 33 03/10/2020   AST 23 03/10/2020   NA 138 06/11/2020   K 3.9 06/11/2020   CL 100 06/11/2020   CREATININE 1.14 06/11/2020   BUN 24 (H) 06/11/2020   CO2 28  06/11/2020   TSH 0.95 09/10/2019   INR 0.9 02/06/2018   HGBA1C 6.4 06/11/2020    No results found.  Assessment & Plan:    Walker Kehr, MD

## 2020-11-11 NOTE — Assessment & Plan Note (Signed)
Loose wt Check A1c 

## 2020-11-11 NOTE — Addendum Note (Signed)
Addended by: Boris Lown B on: 11/11/2020 03:51 PM   Modules accepted: Orders

## 2020-12-11 ENCOUNTER — Other Ambulatory Visit: Payer: Self-pay | Admitting: Internal Medicine

## 2020-12-11 MED ORDER — PROMETHAZINE-CODEINE 6.25-10 MG/5ML PO SYRP: 5.0000 mL | ORAL_SOLUTION | ORAL | 0 refills | Status: DC | PRN

## 2020-12-11 MED ORDER — DOXYCYCLINE HYCLATE 100 MG PO TABS: 100.0000 mg | ORAL_TABLET | Freq: Two times a day (BID) | ORAL | 0 refills | Status: DC

## 2020-12-16 ENCOUNTER — Other Ambulatory Visit: Payer: Self-pay

## 2020-12-16 ENCOUNTER — Encounter: Payer: Self-pay | Admitting: Internal Medicine

## 2020-12-16 ENCOUNTER — Ambulatory Visit: Payer: Federal, State, Local not specified - PPO | Admitting: Internal Medicine

## 2020-12-16 DIAGNOSIS — E1169 Type 2 diabetes mellitus with other specified complication: Secondary | ICD-10-CM | POA: Diagnosis not present

## 2020-12-16 DIAGNOSIS — E6609 Other obesity due to excess calories: Secondary | ICD-10-CM

## 2020-12-16 DIAGNOSIS — Z6831 Body mass index (BMI) 31.0-31.9, adult: Secondary | ICD-10-CM

## 2020-12-16 DIAGNOSIS — M5440 Lumbago with sciatica, unspecified side: Secondary | ICD-10-CM

## 2020-12-16 DIAGNOSIS — G8929 Other chronic pain: Secondary | ICD-10-CM

## 2020-12-16 DIAGNOSIS — K2100 Gastro-esophageal reflux disease with esophagitis, without bleeding: Secondary | ICD-10-CM | POA: Diagnosis not present

## 2020-12-16 DIAGNOSIS — R42 Dizziness and giddiness: Secondary | ICD-10-CM

## 2020-12-16 DIAGNOSIS — E669 Obesity, unspecified: Secondary | ICD-10-CM | POA: Insufficient documentation

## 2020-12-16 DIAGNOSIS — E119 Type 2 diabetes mellitus without complications: Secondary | ICD-10-CM

## 2020-12-16 DIAGNOSIS — I10 Essential (primary) hypertension: Secondary | ICD-10-CM | POA: Diagnosis not present

## 2020-12-16 MED ORDER — PREDNISONE 10 MG PO TABS: ORAL_TABLET | ORAL | 0 refills | Status: DC

## 2020-12-16 MED ORDER — TIRZEPATIDE 2.5 MG/0.5ML ~~LOC~~ SOAJ: 2.5000 mg | SUBCUTANEOUS | 11 refills | Status: DC

## 2020-12-16 NOTE — Assessment & Plan Note (Signed)
Olmesartan, Toprol XL 1/2 tab qd 12.5 mg, Maxzide BP Readings from Last 3 Encounters:  12/16/20 (!) 138/92  11/11/20 118/76  06/11/20 118/72

## 2020-12-16 NOTE — Assessment & Plan Note (Signed)
Better Sporadic episodes

## 2020-12-16 NOTE — Assessment & Plan Note (Signed)
Omeprazole po

## 2020-12-16 NOTE — Assessment & Plan Note (Signed)
Try Mounjaro low dose

## 2020-12-16 NOTE — Assessment & Plan Note (Signed)
Try Darcel Bayley

## 2020-12-16 NOTE — Progress Notes (Signed)
Subjective:  Patient ID: Caroline Wong, female    DOB: 07/23/1960  Age: 60 y.o. MRN: [id number]  CC: Follow-up (6 MONTH F/U)   HPI Caroline Wong presents for sinusitis - much better C/o obesity, wt gain, DM, HTN   Outpatient Medications Prior to Visit  Medication Sig Dispense Refill   albuterol (VENTOLIN HFA) 108 (90 Base) MCG/ACT inhaler INHALE 2 PUFFS INTO THE LUNGS EVERY 6 HOURS AS NEEDED FOR WHEEZING OR SHORTNESS OF BREATH 18 g 3   ascorbic acid (VITAMIN C) 500 MG tablet Take 500 mg by mouth daily.     Cholecalciferol (VITAMIN D3) 5000 units TABS Take 5,000 Units by mouth daily.      cyclobenzaprine (FLEXERIL) 10 MG tablet Take 1 tablet (10 mg total) by mouth 3 (three) times daily as needed for muscle spasms. 60 tablet 0   diphenhydrAMINE (BENADRYL) 25 MG tablet Take 2 tablets (50 mg total) by mouth every 4 (four) hours as needed (bee sting). 30 tablet 3   gabapentin (NEURONTIN) 100 MG capsule Take 2 tablets by mouth daily at bedtime. 60 capsule 9   LORazepam (ATIVAN) 0.5 MG tablet TAKE 1 TABLET(0.5 MG) BY MOUTH AT BEDTIME AS NEEDED 30 tablet 2   meclizine (ANTIVERT) 12.5 MG tablet Take 1 tablet (12.5 mg total) by mouth 3 (three) times daily as needed for dizziness. 60 tablet 1   metFORMIN (GLUCOPHAGE-XR) 500 MG 24 hr tablet Take 1 tablet (500 mg total) by mouth daily with breakfast. 4 tablets at hs 90 tablet 3   metoprolol succinate (TOPROL-XL) 50 MG 24 hr tablet TAKE 1 TABLET BY MOUTH EVERY DAY. TAKE WITH OR IMMEDIATELY FOLLOWING A MEAL 90 tablet 1   olmesartan (BENICAR) 40 MG tablet TAKE 1 TABLET(40 MG) BY MOUTH DAILY 90 tablet 3   omeprazole (PRILOSEC) 40 MG capsule Take 1 capsule (40 mg total) by mouth daily. 90 capsule 3   ondansetron (ZOFRAN-ODT) 4 MG disintegrating tablet DISSOLVE 1 TABLET(4 MG) ON THE TONGUE EVERY 8 HOURS AS NEEDED FOR NAUSEA OR VOMITING 20 tablet 1   phentermine (ADIPEX-P) 37.5 MG tablet TAKE 1 TABLET(37.5 MG) BY MOUTH DAILY BEFORE AND BREAKFAST.   Schedule office visit every 3 months 90 tablet 0   triamterene-hydrochlorothiazide (MAXZIDE-25) 37.5-25 MG tablet Take 1 tablet by mouth daily. Annual appt due in Oct must see provider for future refills 90 tablet 1   zinc gluconate 50 MG tablet Take 50 mg by mouth daily.     doxycycline (VIBRA-TABS) 100 MG tablet Take 1 tablet (100 mg total) by mouth 2 (two) times daily. (Patient not taking: Reported on 12/16/2020) 20 tablet 0   promethazine-codeine (PHENERGAN WITH CODEINE) 6.25-10 MG/5ML syrup Take 5 mLs by mouth every 4 (four) hours as needed. (Patient not taking: Reported on 12/16/2020) 240 mL 0   No facility-administered medications prior to visit.    ROS: Review of Systems  Constitutional:  Negative for activity change, appetite change, chills, fatigue and unexpected weight change.  HENT:  Negative for congestion, mouth sores and sinus pressure.   Eyes:  Negative for visual disturbance.  Respiratory:  Negative for cough and chest tightness.   Gastrointestinal:  Negative for abdominal pain and nausea.  Genitourinary:  Negative for difficulty urinating, frequency and vaginal pain.  Musculoskeletal:  Negative for back pain and gait problem.  Skin:  Negative for pallor and rash.  Neurological:  Negative for dizziness, tremors, weakness, numbness and headaches.  Psychiatric/Behavioral:  Negative for confusion and sleep disturbance.  Objective:  BP (!) 138/92 (BP Location: Left Arm)   Pulse 68   Temp 98.1 F (36.7 C) (Oral)   Ht 5\' 7"  (1.702 m)   Wt 231 lb (104.8 kg) Comment: PER PATIENT  LMP 02/15/2020 (Approximate)   SpO2 97%   BMI 36.18 kg/m   BP Readings from Last 3 Encounters:  12/16/20 (!) 138/92  11/11/20 118/76  06/11/20 118/72    Wt Readings from Last 3 Encounters:  12/16/20 231 lb (104.8 kg)  11/11/20 228 lb (103.4 kg)  06/11/20 233 lb (105.7 kg)    Physical Exam Constitutional:      General: She is not in acute distress.    Appearance: She is  well-developed. She is obese.  HENT:     Head: Normocephalic.     Right Ear: External ear normal.     Left Ear: External ear normal.     Nose: Nose normal.  Eyes:     General:        Right eye: No discharge.        Left eye: No discharge.     Conjunctiva/sclera: Conjunctivae normal.     Pupils: Pupils are equal, round, and reactive to light.  Neck:     Thyroid: No thyromegaly.     Vascular: No JVD.     Trachea: No tracheal deviation.  Cardiovascular:     Rate and Rhythm: Normal rate and regular rhythm.     Heart sounds: Normal heart sounds.  Pulmonary:     Effort: No respiratory distress.     Breath sounds: No stridor. No wheezing.  Abdominal:     General: Bowel sounds are normal. There is no distension.     Palpations: Abdomen is soft. There is no mass.     Tenderness: There is no abdominal tenderness. There is no guarding or rebound.  Musculoskeletal:        General: No tenderness.     Cervical back: Normal range of motion and neck supple. No rigidity.  Lymphadenopathy:     Cervical: No cervical adenopathy.  Skin:    Findings: No erythema or rash.  Neurological:     Cranial Nerves: No cranial nerve deficit.     Motor: No abnormal muscle tone.     Coordination: Coordination normal.     Deep Tendon Reflexes: Reflexes normal.  Psychiatric:        Behavior: Behavior normal.        Thought Content: Thought content normal.        Judgment: Judgment normal.    Lab Results  Component Value Date   WBC 11.0 (H) 03/31/2020   HGB 13.1 03/31/2020   HCT 40.0 03/31/2020   PLT 242 03/31/2020   GLUCOSE 106 (H) 11/11/2020   CHOL 165 03/20/2019   TRIG 148.0 03/20/2019   HDL 47.90 03/20/2019   LDLCALC 88 03/20/2019   ALT 33 11/11/2020   AST 24 11/11/2020   NA 141 11/11/2020   K 3.9 11/11/2020   CL 104 11/11/2020   CREATININE 1.31 (H) 11/11/2020   BUN 33 (H) 11/11/2020   CO2 30 11/11/2020   TSH 0.95 09/10/2019   INR 0.9 02/06/2018   HGBA1C 6.9 (H) 11/11/2020    No  results found.  Assessment & Plan:

## 2020-12-16 NOTE — Assessment & Plan Note (Signed)
Better  

## 2020-12-18 ENCOUNTER — Telehealth: Payer: Self-pay | Admitting: *Deleted

## 2020-12-18 NOTE — Telephone Encounter (Signed)
Rec'd electronic fax pt plan requires specific direction for the Prednisone. Pls resend w/ directions.Marland KitchenJohny Wong

## 2020-12-20 MED ORDER — PREDNISONE 10 MG PO TABS: ORAL_TABLET | ORAL | 0 refills | Status: DC

## 2020-12-20 NOTE — Telephone Encounter (Signed)
Okay.  Done.  Thanks 

## 2020-12-21 ENCOUNTER — Other Ambulatory Visit: Payer: Self-pay | Admitting: Internal Medicine

## 2020-12-21 MED ORDER — ONDANSETRON HCL 4 MG PO TABS: 4.0000 mg | ORAL_TABLET | Freq: Three times a day (TID) | ORAL | 0 refills | Status: DC | PRN

## 2020-12-21 MED ORDER — LORAZEPAM 0.5 MG PO TABS: 0.5000 mg | ORAL_TABLET | Freq: Two times a day (BID) | ORAL | 2 refills | Status: DC | PRN

## 2021-01-05 NOTE — Progress Notes (Signed)
Virtual Visit via Video Note  I connected with Caroline Wong on 01/05/21 at  8:50 AM EDT by a video enabled telemedicine application and verified that I am speaking with the correct person using two identifiers.   I discussed the limitations of evaluation and management by telemedicine and the availability of in person appointments. The patient expressed understanding and agreed to proceed.  Present for the visit:  Myself, Dr Billey Gosling, Tyron Russell.  The patient is currently at home and I am in the office.    No referring provider.    History of Present Illness: She is here for an acute visit for cold symptoms.   Her symptoms started 3 days ago  She is experiencing cold chills/sweats, decreased appetite, fatigue, nasal congestion, left-sided ear pain and itching, sinus pain, sore throat on the roof of her mouth only, cough that is productive, some chest pain that she thinks is from congestion, nausea, myalgias and headaches.  She has tried taking mucinex, tylenol cold and flu, advil    Has not tested herself for covid.  She has not had vaccines for COVID.  Her husband is currently sick with similar symptoms.   Review of Systems  Constitutional:  Positive for chills, diaphoresis and malaise/fatigue. Negative for fever.       Dec appetite  HENT:  Positive for congestion, ear pain (left - pain and itching), sinus pain and sore throat (on roof of mouth).   Respiratory:  Positive for cough and sputum production. Negative for shortness of breath and wheezing.   Cardiovascular:  Positive for chest pain.  Gastrointestinal:  Positive for nausea (last night). Negative for abdominal pain, diarrhea and vomiting.  Musculoskeletal:  Positive for myalgias.  Neurological:  Positive for headaches.     Social History   Socioeconomic History   Marital status: Married    Spouse name: [name]   Number of children: 4   Years of education: college   Highest education level: Not on file   Occupational History   Occupation: Pensions consultant: [employer]  Tobacco Use   Smoking status: Never   Smokeless tobacco: Never  Vaping Use   Vaping Use: Never used  Substance and Sexual Activity   Alcohol use: Yes    Alcohol/week: 0.0 standard drinks    Comment: rarely   Drug use: No   Sexual activity: Yes    Birth control/protection: None, Surgical    Comment: Hyst  Other Topics Concern   Not on file  Social History Narrative   Married 2 sons 2 daughters. SUPERVISOR at [employer]   One caffeinated beverage daily   This was updated 12/12/2013   Social Determinants of Health   Financial Resource Strain: Not on file  Food Insecurity: Not on file  Transportation Needs: Not on file  Physical Activity: Not on file  Stress: Not on file  Social Connections: Not on file     Observations/Objective: Appears well in NAD Breathing normally  Assessment and Plan:  See Problem List for Assessment and Plan of chronic medical problems.   Follow Up Instructions:    I discussed the assessment and treatment plan with the patient. The patient was provided an opportunity to ask questions and all were answered. The patient agreed with the plan and demonstrated an understanding of the instructions.   The patient was advised to call back or seek an in-person evaluation if the symptoms worsen or if the condition fails to  improve as anticipated.    Binnie Rail, MD

## 2021-01-06 ENCOUNTER — Encounter: Payer: Self-pay | Admitting: Internal Medicine

## 2021-01-06 ENCOUNTER — Telehealth (INDEPENDENT_AMBULATORY_CARE_PROVIDER_SITE_OTHER): Payer: Federal, State, Local not specified - PPO | Admitting: Internal Medicine

## 2021-01-06 DIAGNOSIS — J209 Acute bronchitis, unspecified: Secondary | ICD-10-CM

## 2021-01-06 MED ORDER — AZITHROMYCIN 250 MG PO TABS: ORAL_TABLET | ORAL | 0 refills | Status: DC

## 2021-01-06 NOTE — Assessment & Plan Note (Signed)
Acute Difficult to know if this is viral or bacterial - advised covid test -- she will not test herself for covid and does not feel it is covid Will assume it is bacterial Start zpak otc cold medications Has cough syrup at home Rest, fluid Call if no improvement

## 2021-01-11 NOTE — Telephone Encounter (Signed)
   Patient reporting she is taking Mucinex and Tylenol Cold/ Flu, no relief for worsening cough   Please advise

## 2021-01-12 ENCOUNTER — Other Ambulatory Visit: Payer: Self-pay | Admitting: Internal Medicine

## 2021-01-12 MED ORDER — BENZONATATE 200 MG PO CAPS: 200.0000 mg | ORAL_CAPSULE | Freq: Three times a day (TID) | ORAL | 0 refills | Status: DC | PRN

## 2021-01-12 MED ORDER — TIRZEPATIDE 5 MG/0.5ML ~~LOC~~ SOAJ: 5.0000 mg | SUBCUTANEOUS | 2 refills | Status: DC

## 2021-01-12 NOTE — Progress Notes (Signed)
Mounjaro 5

## 2021-02-03 ENCOUNTER — Other Ambulatory Visit: Payer: Self-pay | Admitting: Internal Medicine

## 2021-02-11 ENCOUNTER — Other Ambulatory Visit: Payer: Self-pay | Admitting: Internal Medicine

## 2021-02-11 DIAGNOSIS — Z1231 Encounter for screening mammogram for malignant neoplasm of breast: Secondary | ICD-10-CM

## 2021-03-09 ENCOUNTER — Other Ambulatory Visit: Payer: Self-pay | Admitting: Internal Medicine

## 2021-03-09 MED ORDER — TIRZEPATIDE 7.5 MG/0.5ML ~~LOC~~ SOAJ: 7.5000 mg | SUBCUTANEOUS | 3 refills | Status: DC

## 2021-03-16 ENCOUNTER — Other Ambulatory Visit: Payer: Self-pay | Admitting: Internal Medicine

## 2021-03-16 ENCOUNTER — Ambulatory Visit: Payer: Federal, State, Local not specified - PPO

## 2021-03-16 NOTE — Telephone Encounter (Signed)
Please refill as per office routine med refill policy (all routine meds to be refilled for 3 mo or monthly (per pt preference) up to one year from last visit, then month to month grace period for 3 mo, then further med refills will have to be denied) ? ?

## 2021-03-17 ENCOUNTER — Ambulatory Visit: Payer: Federal, State, Local not specified - PPO | Admitting: Internal Medicine

## 2021-03-18 ENCOUNTER — Ambulatory Visit: Payer: Federal, State, Local not specified - PPO | Admitting: Internal Medicine

## 2021-03-29 ENCOUNTER — Encounter: Payer: Self-pay | Admitting: Internal Medicine

## 2021-03-29 ENCOUNTER — Other Ambulatory Visit: Payer: Self-pay

## 2021-03-29 ENCOUNTER — Ambulatory Visit: Payer: Federal, State, Local not specified - PPO | Admitting: Internal Medicine

## 2021-03-29 DIAGNOSIS — M5440 Lumbago with sciatica, unspecified side: Secondary | ICD-10-CM

## 2021-03-29 DIAGNOSIS — E1169 Type 2 diabetes mellitus with other specified complication: Secondary | ICD-10-CM | POA: Diagnosis not present

## 2021-03-29 DIAGNOSIS — G8929 Other chronic pain: Secondary | ICD-10-CM

## 2021-03-29 DIAGNOSIS — I10 Essential (primary) hypertension: Secondary | ICD-10-CM

## 2021-03-29 DIAGNOSIS — R110 Nausea: Secondary | ICD-10-CM

## 2021-03-29 DIAGNOSIS — Z6831 Body mass index (BMI) 31.0-31.9, adult: Secondary | ICD-10-CM

## 2021-03-29 DIAGNOSIS — E6609 Other obesity due to excess calories: Secondary | ICD-10-CM

## 2021-03-29 DIAGNOSIS — E669 Obesity, unspecified: Secondary | ICD-10-CM

## 2021-03-29 MED ORDER — ONDANSETRON HCL 4 MG PO TABS: 4.0000 mg | ORAL_TABLET | Freq: Three times a day (TID) | ORAL | 1 refills | Status: DC | PRN

## 2021-03-29 NOTE — Assessment & Plan Note (Signed)
On Mounjaro 

## 2021-03-29 NOTE — Addendum Note (Signed)
Addended by: Boris Lown B on: 03/29/2021 04:37 PM   Modules accepted: Orders

## 2021-03-29 NOTE — Patient Instructions (Addendum)
HOKA Bondi wide Hoka recovery sandal

## 2021-03-29 NOTE — Assessment & Plan Note (Signed)
Try Hoka shoes

## 2021-03-29 NOTE — Progress Notes (Signed)
Subjective:  Patient ID: Caroline Wong, female    DOB: 07-Apr-1961  Age: 60 y.o. MRN: [id number]  CC: Follow-up (3 month f/u)   HPI ASHLLEY BOOHER presents for obesity, hyperglycemia, LBP, HTN f/u  Outpatient Medications Prior to Visit  Medication Sig Dispense Refill   ascorbic acid (VITAMIN C) 500 MG tablet Take 500 mg by mouth daily.     Cholecalciferol (VITAMIN D3) 5000 units TABS Take 5,000 Units by mouth daily.      diphenhydrAMINE (BENADRYL) 25 MG tablet Take 2 tablets (50 mg total) by mouth every 4 (four) hours as needed (bee sting). 30 tablet 3   gabapentin (NEURONTIN) 100 MG capsule Take 2 tablets by mouth daily at bedtime. 60 capsule 9   LORazepam (ATIVAN) 0.5 MG tablet TAKE 1 TABLET(0.5 MG) BY MOUTH AT BEDTIME AS NEEDED 30 tablet 2   meclizine (ANTIVERT) 12.5 MG tablet Take 1 tablet (12.5 mg total) by mouth 3 (three) times daily as needed for dizziness. 60 tablet 1   metFORMIN (GLUCOPHAGE-XR) 500 MG 24 hr tablet Take 1 tablet (500 mg total) by mouth daily with breakfast. 4 tablets at hs 90 tablet 3   metoprolol succinate (TOPROL-XL) 50 MG 24 hr tablet TAKE 1 TABLET BY MOUTH EVERY DAY WITH OR IMMEDIATELY FOLLOWING A MEAL 90 tablet 3   olmesartan (BENICAR) 40 MG tablet TAKE 1 TABLET(40 MG) BY MOUTH DAILY 90 tablet 3   omeprazole (PRILOSEC) 40 MG capsule TAKE 1 CAPSULE(40 MG) BY MOUTH DAILY 90 capsule 3   phentermine (ADIPEX-P) 37.5 MG tablet TAKE 1 TABLET(37.5 MG) BY MOUTH DAILY BEFORE AND BREAKFAST.  Schedule office visit every 3 months 90 tablet 0   tirzepatide (MOUNJARO) 7.5 MG/0.5ML Pen Inject 7.5 mg into the skin once a week. 6 mL 3   triamterene-hydrochlorothiazide (MAXZIDE-25) 37.5-25 MG tablet TAKE 1 TABLET BY MOUTH EVERY DAY 90 tablet 3   zinc gluconate 50 MG tablet Take 50 mg by mouth daily.     ondansetron (ZOFRAN) 4 MG tablet Take 1 tablet (4 mg total) by mouth every 8 (eight) hours as needed for nausea or vomiting. 20 tablet 0   azithromycin (ZITHROMAX) 250 MG  tablet Take two tabs the first day and then one tab daily for four days (Patient not taking: Reported on 03/29/2021) 6 tablet 0   benzonatate (TESSALON) 200 MG capsule Take 1 capsule (200 mg total) by mouth 3 (three) times daily as needed for cough. (Patient not taking: Reported on 03/29/2021) 30 capsule 0   cyclobenzaprine (FLEXERIL) 10 MG tablet Take 1 tablet (10 mg total) by mouth 3 (three) times daily as needed for muscle spasms. (Patient not taking: Reported on 03/29/2021) 60 tablet 0   predniSONE (DELTASONE) 10 MG tablet Take 60 mg on day #1, 40 mg a day #2, 20 mg on days #3.  Then stop.  Use as needed bee sting allergy (Patient not taking: Reported on 03/29/2021) 30 tablet 0   No facility-administered medications prior to visit.    ROS: Review of Systems  Constitutional:  Negative for activity change, appetite change, chills, fatigue and unexpected weight change.  HENT:  Negative for congestion, mouth sores and sinus pressure.   Eyes:  Negative for visual disturbance.  Respiratory:  Negative for cough and chest tightness.   Gastrointestinal:  Positive for nausea. Negative for abdominal pain.  Genitourinary:  Negative for difficulty urinating, frequency and vaginal pain.  Musculoskeletal:  Positive for back pain. Negative for gait problem.  Skin:  Negative  for pallor and rash.  Neurological:  Negative for dizziness, tremors, weakness, numbness and headaches.  Psychiatric/Behavioral:  Negative for confusion and sleep disturbance.    Objective:  BP 120/86 (BP Location: Left Arm)   Pulse 75   Temp 98.4 F (36.9 C) (Oral)   Ht 5\' 7"  (1.702 m)   Wt 211 lb (95.7 kg) Comment: per patient  LMP 02/15/2020 (Approximate)   SpO2 97%   BMI 33.05 kg/m   BP Readings from Last 3 Encounters:  03/29/21 120/86  12/16/20 (!) 138/92  11/11/20 118/76    Wt Readings from Last 3 Encounters:  03/29/21 211 lb (95.7 kg)  12/16/20 231 lb (104.8 kg)  11/11/20 228 lb (103.4 kg)    Physical  Exam Constitutional:      General: She is not in acute distress.    Appearance: She is well-developed. She is obese.  HENT:     Head: Normocephalic.     Right Ear: External ear normal.     Left Ear: External ear normal.     Nose: Nose normal.  Eyes:     General:        Right eye: No discharge.        Left eye: No discharge.     Conjunctiva/sclera: Conjunctivae normal.     Pupils: Pupils are equal, round, and reactive to light.  Neck:     Thyroid: No thyromegaly.     Vascular: No JVD.     Trachea: No tracheal deviation.  Cardiovascular:     Rate and Rhythm: Normal rate and regular rhythm.     Heart sounds: Normal heart sounds.  Pulmonary:     Effort: No respiratory distress.     Breath sounds: No stridor. No wheezing.  Abdominal:     General: Bowel sounds are normal. There is no distension.     Palpations: Abdomen is soft. There is no mass.     Tenderness: There is no abdominal tenderness. There is no guarding or rebound.  Musculoskeletal:        General: Tenderness present.     Cervical back: Normal range of motion and neck supple. No rigidity.  Lymphadenopathy:     Cervical: No cervical adenopathy.  Skin:    Findings: No erythema or rash.  Neurological:     Cranial Nerves: No cranial nerve deficit.     Motor: No abnormal muscle tone.     Coordination: Coordination normal.     Deep Tendon Reflexes: Reflexes normal.  Psychiatric:        Behavior: Behavior normal.        Thought Content: Thought content normal.        Judgment: Judgment normal.  LS tender  Lab Results  Component Value Date   WBC 11.0 (H) 03/31/2020   HGB 13.1 03/31/2020   HCT 40.0 03/31/2020   PLT 242 03/31/2020   GLUCOSE 106 (H) 11/11/2020   CHOL 165 03/20/2019   TRIG 148.0 03/20/2019   HDL 47.90 03/20/2019   LDLCALC 88 03/20/2019   ALT 33 11/11/2020   AST 24 11/11/2020   NA 141 11/11/2020   K 3.9 11/11/2020   CL 104 11/11/2020   CREATININE 1.31 (H) 11/11/2020   BUN 33 (H) 11/11/2020    CO2 30 11/11/2020   TSH 0.95 09/10/2019   INR 0.9 02/06/2018   HGBA1C 6.9 (H) 11/11/2020    No results found.  Assessment & Plan:   Problem List Items Addressed This Visit     Diabetes  mellitus type 2 in obese (HCC)    On Mounjaro      Relevant Orders   Comprehensive metabolic panel   Hemoglobin A1c   Hypertension     Olmesartan, Toprol XL 1/2 tab qd 12.5 mg, Maxzide      Lower back pain    Try Hoka shoes      Nausea    GERD related Zofran prn      Obesity    Cont on Mounjaro         Meds ordered this encounter  Medications   ondansetron (ZOFRAN) 4 MG tablet    Sig: Take 1 tablet (4 mg total) by mouth every 8 (eight) hours as needed for nausea or vomiting.    Dispense:  20 tablet    Refill:  1       Follow-up: Return in about 3 months (around 06/29/2021) for Wellness Exam.  Walker Kehr, MD

## 2021-03-29 NOTE — Assessment & Plan Note (Signed)
Cont on [employer]

## 2021-03-29 NOTE — Assessment & Plan Note (Signed)
  Olmesartan, Toprol XL 1/2 tab qd 12.5 mg, Maxzide

## 2021-03-29 NOTE — Assessment & Plan Note (Signed)
GERD related Zofran prn

## 2021-03-30 LAB — COMPREHENSIVE METABOLIC PANEL
ALT: 31 U/L (ref 0–35)
AST: 25 U/L (ref 0–37)
Albumin: 4.4 g/dL (ref 3.5–5.2)
Alkaline Phosphatase: 88 U/L (ref 39–117)
BUN: 25 mg/dL — ABNORMAL HIGH (ref 6–23)
CO2: 29 mEq/L (ref 19–32)
Calcium: 9.5 mg/dL (ref 8.4–10.5)
Chloride: 100 mEq/L (ref 96–112)
Creatinine, Ser: 1.21 mg/dL — ABNORMAL HIGH (ref 0.40–1.20)
GFR: 49.59 mL/min — ABNORMAL LOW (ref >60.00)
Glucose, Bld: 102 mg/dL — ABNORMAL HIGH (ref 70–99)
Potassium: 3.4 mEq/L — ABNORMAL LOW (ref 3.5–5.1)
Sodium: 137 mEq/L (ref 135–145)
Total Bilirubin: 0.6 mg/dL (ref 0.2–1.2)
Total Protein: 7.7 g/dL (ref 6.0–8.3)

## 2021-03-30 LAB — HEMOGLOBIN A1C: Hgb A1c MFr Bld: 6 % (ref 4.6–6.5)

## 2021-04-08 ENCOUNTER — Other Ambulatory Visit: Payer: Self-pay

## 2021-04-08 ENCOUNTER — Ambulatory Visit
Admission: RE | Admit: 2021-04-08 | Discharge: 2021-04-08 | Disposition: A | Discharge status: Home / Self Care | Payer: Federal, State, Local not specified - PPO | Source: Ambulatory Visit | Attending: Internal Medicine | Admitting: Internal Medicine

## 2021-04-08 DIAGNOSIS — Z1231 Encounter for screening mammogram for malignant neoplasm of breast: Secondary | ICD-10-CM

## 2021-04-09 NOTE — Telephone Encounter (Signed)
Submitted PA via cover-my-meds w/ Key: [id number]. Waiting on insurance determination.Marland KitchenJohny Chess

## 2021-04-11 ENCOUNTER — Other Ambulatory Visit: Payer: Self-pay | Admitting: Internal Medicine

## 2021-04-12 ENCOUNTER — Other Ambulatory Visit: Payer: Self-pay | Admitting: Adult Health

## 2021-04-12 ENCOUNTER — Telehealth: Payer: Self-pay | Admitting: Adult Health

## 2021-04-12 DIAGNOSIS — N644 Mastodynia: Secondary | ICD-10-CM

## 2021-04-12 NOTE — Telephone Encounter (Signed)
Pt called to request an order for a diagnostic mammogram to be sent to [facility] she went for her screening mammogram but they wouldn't do it due to her right breast being tender, heavier, & larger that her left  She's been waiting & has heard nothing, please send order so this can be scheduled  Please advise & notify pt

## 2021-04-12 NOTE — Telephone Encounter (Signed)
Pt aware that breast order in had tenderness

## 2021-04-13 ENCOUNTER — Ambulatory Visit: Payer: Federal, State, Local not specified - PPO | Admitting: Internal Medicine

## 2021-04-13 ENCOUNTER — Encounter: Payer: Self-pay | Admitting: Internal Medicine

## 2021-04-13 ENCOUNTER — Other Ambulatory Visit: Payer: Self-pay

## 2021-04-13 DIAGNOSIS — M25562 Pain in left knee: Secondary | ICD-10-CM | POA: Insufficient documentation

## 2021-04-13 DIAGNOSIS — F4321 Adjustment disorder with depressed mood: Secondary | ICD-10-CM | POA: Insufficient documentation

## 2021-04-13 DIAGNOSIS — G8929 Other chronic pain: Secondary | ICD-10-CM | POA: Diagnosis not present

## 2021-04-13 DIAGNOSIS — F329 Major depressive disorder, single episode, unspecified: Secondary | ICD-10-CM

## 2021-04-13 NOTE — Assessment & Plan Note (Signed)
Worse Poss OA/strain Rest Off work x 2 wks Ibuprofen prn

## 2021-04-13 NOTE — Progress Notes (Addendum)
Subjective:  Patient ID: Caroline Wong, female    DOB: 1960-11-30  Age: 60 y.o. MRN: [id number]  CC: Stress (Pt states she is up under a lot of stress.. its so bad that her hair is starting to come out) and Knee Pain (Left knee Pain)   HPI Caroline Wong presents for stress, depression.  Working 37 h/wk Mother died in 2020/06/19 Sister died of ovarian cancer in March 26, 2023 C/o L knee pain - worse    Outpatient Medications Prior to Visit  Medication Sig Dispense Refill   ascorbic acid (VITAMIN C) 500 MG tablet Take 500 mg by mouth daily.     Cholecalciferol (VITAMIN D3) 5000 units TABS Take 5,000 Units by mouth daily.      diphenhydrAMINE (BENADRYL) 25 MG tablet Take 2 tablets (50 mg total) by mouth every 4 (four) hours as needed (bee sting). 30 tablet 3   gabapentin (NEURONTIN) 100 MG capsule Take 2 tablets by mouth daily at bedtime. 60 capsule 9   LORazepam (ATIVAN) 0.5 MG tablet TAKE 1 TABLET(0.5 MG) BY MOUTH AT BEDTIME AS NEEDED 30 tablet 2   meclizine (ANTIVERT) 12.5 MG tablet Take 1 tablet (12.5 mg total) by mouth 3 (three) times daily as needed for dizziness. 60 tablet 1   metFORMIN (GLUCOPHAGE-XR) 500 MG 24 hr tablet TAKE 1 TABLET BY MOUTH DAILY WITH BREAKFAST THEN TAKE 4 TABLETS BY MOUTH AT BEDTIME 90 tablet 3   metoprolol succinate (TOPROL-XL) 50 MG 24 hr tablet TAKE 1 TABLET BY MOUTH EVERY DAY WITH OR IMMEDIATELY FOLLOWING A MEAL 90 tablet 3   olmesartan (BENICAR) 40 MG tablet TAKE 1 TABLET(40 MG) BY MOUTH DAILY 90 tablet 3   omeprazole (PRILOSEC) 40 MG capsule TAKE 1 CAPSULE(40 MG) BY MOUTH DAILY 90 capsule 3   ondansetron (ZOFRAN) 4 MG tablet Take 1 tablet (4 mg total) by mouth every 8 (eight) hours as needed for nausea or vomiting. 20 tablet 1   tirzepatide (MOUNJARO) 7.5 MG/0.5ML Pen Inject 7.5 mg into the skin once a week. 6 mL 3   triamterene-hydrochlorothiazide (MAXZIDE-25) 37.5-25 MG tablet TAKE 1 TABLET BY MOUTH EVERY DAY 90 tablet 3   zinc gluconate 50 MG tablet Take  50 mg by mouth daily.     phentermine (ADIPEX-P) 37.5 MG tablet TAKE 1 TABLET(37.5 MG) BY MOUTH DAILY BEFORE AND BREAKFAST.  Schedule office visit every 3 months 90 tablet 0   No facility-administered medications prior to visit.    ROS: Review of Systems  Constitutional:  Positive for fatigue. Negative for activity change, appetite change, chills and unexpected weight change.  HENT:  Negative for congestion, mouth sores and sinus pressure.   Eyes:  Negative for visual disturbance.  Respiratory:  Negative for cough and chest tightness.   Gastrointestinal:  Negative for abdominal pain and nausea.  Genitourinary:  Negative for difficulty urinating, frequency and vaginal pain.  Musculoskeletal:  Positive for arthralgias and gait problem. Negative for back pain.  Skin:  Negative for pallor and rash.  Neurological:  Negative for dizziness, tremors, weakness, numbness and headaches.  Psychiatric/Behavioral:  Positive for dysphoric mood. Negative for confusion, sleep disturbance and suicidal ideas. The patient is nervous/anxious.    Objective:  BP 138/90 (BP Location: Left Arm)   Pulse 75   Temp 98.2 F (36.8 C) (Oral)   LMP 02/15/2020 (Approximate)   SpO2 98%   BP Readings from Last 3 Encounters:  04/13/21 138/90  03/29/21 120/86  12/16/20 (!) 138/92    [facility]  Readings from Last 3 Encounters:  03/29/21 211 lb (95.7 kg)  12/16/20 231 lb (104.8 kg)  11/11/20 228 lb (103.4 kg)    Physical Exam Constitutional:      General: She is not in acute distress.    Appearance: She is well-developed. She is obese.  HENT:     Head: Normocephalic.     Right Ear: External ear normal.     Left Ear: External ear normal.     Nose: Nose normal.  Eyes:     General:        Right eye: No discharge.        Left eye: No discharge.     Conjunctiva/sclera: Conjunctivae normal.     Pupils: Pupils are equal, round, and reactive to light.  Neck:     Thyroid: No thyromegaly.     Vascular: No JVD.      Trachea: No tracheal deviation.  Cardiovascular:     Rate and Rhythm: Normal rate and regular rhythm.     Heart sounds: Normal heart sounds.  Pulmonary:     Effort: No respiratory distress.     Breath sounds: No stridor. No wheezing.  Abdominal:     General: Bowel sounds are normal. There is no distension.     Palpations: Abdomen is soft. There is no mass.     Tenderness: There is no abdominal tenderness. There is no guarding or rebound.  Musculoskeletal:        General: No tenderness.     Cervical back: Normal range of motion and neck supple. No rigidity.  Lymphadenopathy:     Cervical: No cervical adenopathy.  Skin:    Findings: No erythema or rash.  Neurological:     Cranial Nerves: No cranial nerve deficit.     Motor: No abnormal muscle tone.     Coordination: Coordination normal.     Deep Tendon Reflexes: Reflexes normal.  Psychiatric:        Behavior: Behavior normal.        Thought Content: Thought content normal.        Judgment: Judgment normal.  Sad L knee w/posterior pain  Lab Results  Component Value Date   WBC 11.0 (H) 03/31/2020   HGB 13.1 03/31/2020   HCT 40.0 03/31/2020   PLT 242 03/31/2020   GLUCOSE 102 (H) 03/29/2021   CHOL 165 03/20/2019   TRIG 148.0 03/20/2019   HDL 47.90 03/20/2019   LDLCALC 88 03/20/2019   ALT 31 03/29/2021   AST 25 03/29/2021   NA 137 03/29/2021   K 3.4 (L) 03/29/2021   CL 100 03/29/2021   CREATININE 1.21 (H) 03/29/2021   BUN 25 (H) 03/29/2021   CO2 29 03/29/2021   TSH 0.95 09/10/2019   INR 0.9 02/06/2018   HGBA1C 6.0 03/29/2021    No results found.  Assessment & Plan:   Problem List Items Addressed This Visit     Grief     Stress. Grief - worse. Working 60 h wk. Mother died in 06-18-20 Sister died of ovarian cancer in March 25, 2023 Off work 2 wks RTC 3-4 wks      Knee pain, left    Worse Poss OA/strain Rest Off work x 2 wks Ibuprofen prn      Reactive depression (situational)     Stress. Grief -  worse. Working 60 h wk. Mother died in 06-18-2020 Sister died of ovarian cancer in March 25, 2023 Off work x2 wks Lorazepam prn Counseling offered  No orders of the defined types were placed in this encounter.     Follow-up: Return in about 4 weeks (around 05/11/2021) for a follow-up visit.  Walker Kehr, MD

## 2021-04-13 NOTE — Assessment & Plan Note (Signed)
Stress. Grief - worse. Working 60 h wk. Mother died in 06-17-20 Sister died of ovarian cancer in March 24, 2023 Off work 2 wks RTC 3-4 wks

## 2021-04-13 NOTE — Assessment & Plan Note (Signed)
Stress. Grief - worse. Working 60 h wk. Mother died in 15-Jun-2020 Sister died of ovarian cancer in 03/22/2023 Off work x2 wks Lorazepam prn Counseling offered

## 2021-04-14 NOTE — Telephone Encounter (Signed)
Submitted PA via cover-my-meds w/ key # Key: [id number]. Rec'd msg stating " The requested medication is not covered under the [employer] formulary. These non-covered drugs have available covered options in the same therapeutic class which are listed along with information on the formulary exception process for [employer] online." Sent information to pt to see if she can get in contact w/ insurance to see what alternative s they will cover.\../lmb

## 2021-04-19 ENCOUNTER — Telehealth: Payer: Self-pay | Admitting: Internal Medicine

## 2021-04-19 NOTE — Telephone Encounter (Signed)
Type of form received (Home Health, FMLA, disability, handicapped placard, Surgical clearance) FMLA  Form placed in (E-fax folder, Provider mailbox)  Provider mailbox  Additional instructions from the patient (mail, fax, notify by phone when complete) Fax to [phone]  Things to remember: [place] office: If form received in person, remind patient that forms take 7-10 business days CMA should attach charge sheet and put on Supervisor's desk   Patient states the paperwork was received on Saturday and has to be submitted by tomorrow for her FMLA

## 2021-04-20 NOTE — Telephone Encounter (Signed)
Place form in purple folder for completion.Marland KitchenJohny Wong

## 2021-04-21 NOTE — Telephone Encounter (Signed)
Notified pt MD has complete his portion, but Part 1 she has to fill out. Pt states she will come to the office to complete, along with FMLA forms.Marland KitchenJohny Wong

## 2021-04-21 NOTE — Telephone Encounter (Signed)
Done. Thx.

## 2021-04-22 ENCOUNTER — Encounter: Payer: Self-pay | Admitting: Internal Medicine

## 2021-04-22 NOTE — Telephone Encounter (Signed)
Paperwork completed in office. Collected and placed in Provider mailbox.

## 2021-04-22 NOTE — Telephone Encounter (Signed)
Form faxed to [phone].Marland KitchenJohny Chess

## 2021-05-11 ENCOUNTER — Encounter: Payer: Self-pay | Admitting: Internal Medicine

## 2021-05-11 ENCOUNTER — Other Ambulatory Visit: Payer: Self-pay

## 2021-05-11 ENCOUNTER — Ambulatory Visit: Payer: Federal, State, Local not specified - PPO | Admitting: Internal Medicine

## 2021-05-11 DIAGNOSIS — F329 Major depressive disorder, single episode, unspecified: Secondary | ICD-10-CM

## 2021-05-11 DIAGNOSIS — E6609 Other obesity due to excess calories: Secondary | ICD-10-CM | POA: Diagnosis not present

## 2021-05-11 DIAGNOSIS — Z6831 Body mass index (BMI) 31.0-31.9, adult: Secondary | ICD-10-CM

## 2021-05-11 DIAGNOSIS — E1169 Type 2 diabetes mellitus with other specified complication: Secondary | ICD-10-CM

## 2021-05-11 DIAGNOSIS — E669 Obesity, unspecified: Secondary | ICD-10-CM

## 2021-05-11 MED ORDER — TIRZEPATIDE 7.5 MG/0.5ML ~~LOC~~ SOAJ: 7.5000 mg | SUBCUTANEOUS | 3 refills | Status: DC

## 2021-05-11 NOTE — Assessment & Plan Note (Signed)
Pt lost 25 lbs

## 2021-05-11 NOTE — Assessment & Plan Note (Signed)
Back to work - light duty

## 2021-05-11 NOTE — Assessment & Plan Note (Signed)
On [place]jar[place] Doing well

## 2021-05-11 NOTE — Progress Notes (Signed)
Subjective:  Patient ID: Caroline Wong, female    DOB: Jun 28, 1960  Age: 60 y.o. MRN: [id number]  CC: No chief complaint on file.   HPI Caroline Wong presents for depression, DM Pt lost 25 lbs  Outpatient Medications Prior to Visit  Medication Sig Dispense Refill  . ascorbic acid (VITAMIN C) 500 MG tablet Take 500 mg by mouth daily.    . Cholecalciferol (VITAMIN D3) 5000 units TABS Take 5,000 Units by mouth daily.     . diphenhydrAMINE (BENADRYL) 25 MG tablet Take 2 tablets (50 mg total) by mouth every 4 (four) hours as needed (bee sting). 30 tablet 3  . gabapentin (NEURONTIN) 100 MG capsule Take 2 tablets by mouth daily at bedtime. 60 capsule 9  . LORazepam (ATIVAN) 0.5 MG tablet TAKE 1 TABLET(0.5 MG) BY MOUTH AT BEDTIME AS NEEDED 30 tablet 2  . meclizine (ANTIVERT) 12.5 MG tablet Take 1 tablet (12.5 mg total) by mouth 3 (three) times daily as needed for dizziness. 60 tablet 1  . metFORMIN (GLUCOPHAGE-XR) 500 MG 24 hr tablet TAKE 1 TABLET BY MOUTH DAILY WITH BREAKFAST THEN TAKE 4 TABLETS BY MOUTH AT BEDTIME 90 tablet 3  . metoprolol succinate (TOPROL-XL) 50 MG 24 hr tablet TAKE 1 TABLET BY MOUTH EVERY DAY WITH OR IMMEDIATELY FOLLOWING A MEAL 90 tablet 3  . olmesartan (BENICAR) 40 MG tablet TAKE 1 TABLET(40 MG) BY MOUTH DAILY 90 tablet 3  . omeprazole (PRILOSEC) 40 MG capsule TAKE 1 CAPSULE(40 MG) BY MOUTH DAILY 90 capsule 3  . ondansetron (ZOFRAN) 4 MG tablet Take 1 tablet (4 mg total) by mouth every 8 (eight) hours as needed for nausea or vomiting. 20 tablet 1  . triamterene-hydrochlorothiazide (MAXZIDE-25) 37.5-25 MG tablet TAKE 1 TABLET BY MOUTH EVERY DAY 90 tablet 3  . zinc gluconate 50 MG tablet Take 50 mg by mouth daily.    . tirzepatide (MOUNJARO) 7.5 MG/0.5ML Pen Inject 7.5 mg into the skin once a week. 6 mL 3   No facility-administered medications prior to visit.    ROS: Review of Systems  Constitutional:  Negative for activity change, appetite change, chills, fatigue  and unexpected weight change.  HENT:  Negative for congestion, mouth sores and sinus pressure.   Eyes:  Negative for visual disturbance.  Respiratory:  Negative for cough and chest tightness.   Gastrointestinal:  Negative for abdominal pain and nausea.  Genitourinary:  Negative for difficulty urinating, frequency and vaginal pain.  Musculoskeletal:  Negative for back pain and gait problem.  Skin:  Negative for pallor and rash.  Neurological:  Negative for dizziness, tremors, weakness, numbness and headaches.  Psychiatric/Behavioral:  Negative for confusion and sleep disturbance.    Objective:  BP (!) 160/96 (BP Location: Left Arm, Patient Position: Sitting, Cuff Size: Large)   Pulse 71   Temp 98.2 F (36.8 C) (Oral)   Ht 5\' 7"  (1.702 m)   LMP 02/15/2020 (Approximate)   SpO2 96%   BMI 33.05 kg/m   BP Readings from Last 3 Encounters:  05/11/21 (!) 160/96  04/13/21 138/90  03/29/21 120/86    Wt Readings from Last 3 Encounters:  03/29/21 211 lb (95.7 kg)  12/16/20 231 lb (104.8 kg)  11/11/20 228 lb (103.4 kg)    Physical Exam Constitutional:      General: She is not in acute distress.    Appearance: She is well-developed.  HENT:     Head: Normocephalic.     Right Ear: External ear normal.  Left Ear: External ear normal.     Nose: Nose normal.  Eyes:     General:        Right eye: No discharge.        Left eye: No discharge.     Conjunctiva/sclera: Conjunctivae normal.     Pupils: Pupils are equal, round, and reactive to light.  Neck:     Thyroid: No thyromegaly.     Vascular: No JVD.     Trachea: No tracheal deviation.  Cardiovascular:     Rate and Rhythm: Normal rate and regular rhythm.     Heart sounds: Normal heart sounds.  Pulmonary:     Effort: No respiratory distress.     Breath sounds: No stridor. No wheezing.  Abdominal:     General: Bowel sounds are normal. There is no distension.     Palpations: Abdomen is soft. There is no mass.     Tenderness:  no abdominal tenderness There is no guarding or rebound.  Musculoskeletal:        General: No tenderness.     Cervical back: Normal range of motion and neck supple. No rigidity.  Lymphadenopathy:     Cervical: No cervical adenopathy.  Skin:    Findings: No erythema or rash.  Neurological:     Mental Status: She is oriented to person, place, and time.     Cranial Nerves: No cranial nerve deficit.     Motor: No abnormal muscle tone.     Coordination: Coordination normal.     Deep Tendon Reflexes: Reflexes normal.  Psychiatric:        Behavior: Behavior normal.        Thought Content: Thought content normal.        Judgment: Judgment normal.    Lab Results  Component Value Date   WBC 11.0 (H) 03/31/2020   HGB 13.1 03/31/2020   HCT 40.0 03/31/2020   PLT 242 03/31/2020   GLUCOSE 102 (H) 03/29/2021   CHOL 165 03/20/2019   TRIG 148.0 03/20/2019   HDL 47.90 03/20/2019   LDLCALC 88 03/20/2019   ALT 31 03/29/2021   AST 25 03/29/2021   NA 137 03/29/2021   K 3.4 (L) 03/29/2021   CL 100 03/29/2021   CREATININE 1.21 (H) 03/29/2021   BUN 25 (H) 03/29/2021   CO2 29 03/29/2021   TSH 0.95 09/10/2019   INR 0.9 02/06/2018   HGBA1C 6.0 03/29/2021    No results found.  Assessment & Plan:   Problem List Items Addressed This Visit     Diabetes mellitus type 2 in obese ([place])    On Mou[place]jaro Doing well      Relevant Medications   tirzepatide (MOUNJARO) 7.5 MG/0.5ML Pen   Obesity    Pt lost 25 lbs      Relevant Medications   tirzepatide (MOUNJARO) 7.5 MG/0.5ML Pen   Reactive depression (situational)    Back to work - light duty         Meds ordered this encounter  Medications  . tirzepatide (MOUNJARO) 7.5 MG/0.5ML Pen    Sig: Inject 7.5 mg into the skin once a week.    Dispense:  6 mL    Refill:  3       Follow-up: No follow-ups on file.  Walker Kehr, MD

## 2021-05-20 ENCOUNTER — Ambulatory Visit
Admission: RE | Admit: 2021-05-20 | Discharge: 2021-05-20 | Disposition: A | Discharge status: Home / Self Care | Payer: Federal, State, Local not specified - PPO | Source: Ambulatory Visit | Attending: Adult Health | Admitting: Adult Health

## 2021-05-20 ENCOUNTER — Ambulatory Visit: Payer: Federal, State, Local not specified - PPO

## 2021-05-20 ENCOUNTER — Other Ambulatory Visit: Payer: Self-pay

## 2021-05-20 DIAGNOSIS — N644 Mastodynia: Secondary | ICD-10-CM | POA: Diagnosis not present

## 2021-05-20 DIAGNOSIS — R922 Inconclusive mammogram: Secondary | ICD-10-CM | POA: Diagnosis not present

## 2021-06-16 NOTE — Progress Notes (Deleted)
61 y.o. G39P2002 Married Caucasian female here for annual exam.    PCP:     Patient's last menstrual period was 02/15/2020 (approximate).           Sexually active: {yes no:314532}  The current method of family planning is status post hysterectomy.    Exercising: {yes no:314532}  {types:19826} Smoker:  no  Health Maintenance: Pap:   06-11-19 Neg:Neg HR HPV, 10-24-17 Neg:Neg HR HPV, 08-19-15 Neg:Neg HR HPV  History of abnormal Pap:  no MMG:  05-20-21 Neg/BiRads1 Colonoscopy:  03-03-14 normal;next 10 years BMD:   n/a  Result  n/a TDaP:  04-05-15 Gardasil:   no HIV:Neg years ago Hep C:10-20-17 Neg Screening Labs:  Hb today: ***, Urine today: ***   reports that she has never smoked. She has never used smokeless tobacco. She reports current alcohol use. She reports that she does not use drugs.  Past Medical History:  Diagnosis Date   Abdominal pain 11/25/2013   Has some nausea associated with pain that has been on and off x 2 weeks will get [place]   Anxiety    Takes Lorazepam   Cancer ([place])    skin Ca. removed from chest   Chronic bronchitis ([facility])    Diabetes mellitus without complication ([facility])    Elevated serum creatinine    Endometrial polyp    Gallstones    GERD (gastroesophageal reflux disease)    Hormone replacement therapy (HRT)    Hypertension    Night sweats 04/14/2014   Obesity 05/07/2014   STD (sexually transmitted disease)    HSV ?carrier--never had an outbreak but exposed from ex-husband   Urge incontinence 08/19/2015   Yeast infection 05/07/2014    Past Surgical History:  Procedure Laterality Date   ABDOMINAL HYSTERECTOMY     complete   ANTERIOR CERVICAL DECOMP/DISCECTOMY FUSION N/A 06/05/2015   Procedure: C5-6, C6-7 Anterior Cervical Discectomy and Fusion, Allograft, Plate;  Surgeon: [name], MD;  Location: [facility];  Service: Orthopedics;  Laterality: N/A;   ANTERIOR FUSION CERVICAL SPINE  06/05/2015   C 5 6 7     CHOLECYSTECTOMY     COLONOSCOPY  2015   NO  POLYPS   CYSTOSCOPY N/A 03/31/2020   Procedure: CYSTOSCOPY;  Surgeon: Nunzio Cobbs, MD;  [facility];  Service: Gynecology;  Laterality: N/A;   FOOT SURGERY Left    GANGLION CYST EXCISION Right 07/09/2018   Procedure: EXCISION, RIGHT VOLAR WRIST GANGLION;  Surgeon: [name], MD;  Location: [facility];  Service: Orthopedics;  Laterality: Right;   MOLE REMOVAL     TOTAL LAPAROSCOPIC HYSTERECTOMY WITH BILATERAL SALPINGO OOPHORECTOMY N/A 03/31/2020   Procedure: TOTAL LAPAROSCOPIC HYSTERECTOMY WITH BILATERAL SALPINGECTOMY POSSIBLE BILATERAL OOPHORECTOMY;  Surgeon: Nunzio Cobbs, MD;  Location[facility];  Service: Gynecology;  Laterality: N/A;  PLEASE have [employer] as prep solution.  Patient is allergic to Betadine, CHG, and Duraprep.   WISDOM TOOTH EXTRACTION      Current Outpatient Medications  Medication Sig Dispense Refill   ascorbic acid (VITAMIN C) 500 MG tablet Take 500 mg by mouth daily.     Cholecalciferol (VITAMIN D3) 5000 units TABS Take 5,000 Units by mouth daily.      diphenhydrAMINE (BENADRYL) 25 MG tablet Take 2 tablets (50 mg total) by mouth every 4 (four) hours as needed (bee sting). 30 tablet 3   gabapentin (NEURONTIN) 100 MG capsule Take 2 tablets by mouth daily at bedtime.  60 capsule 9   LORazepam (ATIVAN) 0.5 MG tablet TAKE 1 TABLET(0.5 MG) BY MOUTH AT BEDTIME AS NEEDED 30 tablet 2   meclizine (ANTIVERT) 12.5 MG tablet Take 1 tablet (12.5 mg total) by mouth 3 (three) times daily as needed for dizziness. 60 tablet 1   metFORMIN (GLUCOPHAGE-XR) 500 MG 24 hr tablet TAKE 1 TABLET BY MOUTH DAILY WITH BREAKFAST THEN TAKE 4 TABLETS BY MOUTH AT BEDTIME 90 tablet 3   metoprolol succinate (TOPROL-XL) 50 MG 24 hr tablet TAKE 1 TABLET BY MOUTH EVERY DAY WITH OR IMMEDIATELY FOLLOWING A MEAL 90 tablet 3   olmesartan (BENICAR) 40 MG tablet TAKE 1 TABLET(40 MG) BY MOUTH DAILY 90 tablet 3   omeprazole  (PRILOSEC) 40 MG capsule TAKE 1 CAPSULE(40 MG) BY MOUTH DAILY 90 capsule 3   ondansetron (ZOFRAN) 4 MG tablet Take 1 tablet (4 mg total) by mouth every 8 (eight) hours as needed for nausea or vomiting. 20 tablet 1   tirzepatide (MOUNJARO) 7.5 MG/0.5ML Pen Inject 7.5 mg into the skin once a week. 6 mL 3   triamterene-hydrochlorothiazide (MAXZIDE-25) 37.5-25 MG tablet TAKE 1 TABLET BY MOUTH EVERY DAY 90 tablet 3   zinc gluconate 50 MG tablet Take 50 mg by mouth daily.     No current facility-administered medications for this visit.    Family History  Problem Relation Age of Onset   Breast cancer Mother 55       breast   Diabetes Mother    Fibromyalgia Mother    Heart disease Mother        CHF   Hypertension Mother    Kidney disease Mother    Other Mother        COVID   Heart disease Father    Emphysema Father    Diabetes Brother    Hypertension Brother        x 2   Heart disease Maternal Grandmother    Diabetes Maternal Grandmother    Asthma Maternal Grandmother    Arthritis Maternal Grandmother    Heart disease Maternal Grandfather    Arthritis Maternal Grandfather    Cancer Maternal Grandfather        bladder   Breast cancer Paternal Grandmother    Heart disease Paternal Grandmother    Arthritis Paternal Grandmother    Stroke Paternal Grandmother    Ovarian cancer Paternal Grandmother    Diabetes Paternal Grandfather    Heart disease Paternal Grandfather    Colon cancer Paternal Grandfather        mets   Arthritis Paternal Grandfather    Stomach cancer Paternal Grandfather        mets to stomach   Hypertension Brother    Cancer Sister        ?endometrial ca   Breast cancer Maternal Aunt    Breast cancer Paternal Aunt    Esophageal cancer Neg Hx    Pancreatic cancer Neg Hx    Rectal cancer Neg Hx     Review of Systems  Exam:   LMP 02/15/2020 (Approximate)     General appearance: alert, cooperative and appears stated age Head: normocephalic, without obvious  abnormality, atraumatic Neck: no adenopathy, supple, symmetrical, trachea midline and thyroid normal to inspection and palpation Lungs: clear to auscultation bilaterally Breasts: normal appearance, no masses or tenderness, No nipple retraction or dimpling, No nipple discharge or bleeding, No axillary adenopathy Heart: regular rate and rhythm Abdomen: soft, non-tender; no masses, no organomegaly Extremities: extremities normal, atraumatic, no cyanosis  or edema Skin: skin color, texture, turgor normal. No rashes or lesions Lymph nodes: cervical, supraclavicular, and axillary nodes normal. Neurologic: grossly normal  Pelvic: External genitalia:  no lesions              No abnormal inguinal nodes palpated.              Urethra:  normal appearing urethra with no masses, tenderness or lesions              Bartholins and Skenes: normal                 Vagina: normal appearing vagina with normal color and discharge, no lesions              Cervix: no lesions              Pap taken: {yes no:314532} Bimanual Exam:  Uterus:  normal size, contour, position, consistency, mobility, non-tender              Adnexa: no mass, fullness, tenderness              Rectal exam: {yes no:314532}.  Confirms.              Anus:  normal sphincter tone, no lesions  Chaperone was present for exam:  ***  Assessment:   Well woman visit with gynecologic exam.   Plan: Mammogram screening discussed. Self breast awareness reviewed. Pap and HR HPV as above. Guidelines for Calcium, Vitamin D, regular exercise program including cardiovascular and weight bearing exercise.   Follow up annually and prn.   Additional counseling given.  {yes [id number]. _______ minutes face to face time of which over 50% was spent in counseling.    After visit summary provided.

## 2021-06-17 ENCOUNTER — Ambulatory Visit: Payer: Federal, State, Local not specified - PPO | Admitting: Obstetrics and Gynecology

## 2021-06-17 DIAGNOSIS — Z0289 Encounter for other administrative examinations: Secondary | ICD-10-CM

## 2021-07-02 ENCOUNTER — Telehealth: Payer: Self-pay

## 2021-07-02 NOTE — Telephone Encounter (Signed)
Pt states that the Pharmacy says that she needs a PA since its a new year for: tirzepatide (MOUNJARO) 7.5 MG/0.5ML Pen.  Pharmacy: [facility] [place], [place] AT [place] 05/11/21

## 2021-07-05 ENCOUNTER — Other Ambulatory Visit: Payer: Self-pay

## 2021-07-05 ENCOUNTER — Encounter: Payer: Self-pay | Admitting: Adult Health

## 2021-07-05 ENCOUNTER — Ambulatory Visit (INDEPENDENT_AMBULATORY_CARE_PROVIDER_SITE_OTHER): Payer: Federal, State, Local not specified - PPO | Admitting: Adult Health

## 2021-07-05 ENCOUNTER — Ambulatory Visit: Payer: Federal, State, Local not specified - PPO | Admitting: Internal Medicine

## 2021-07-05 VITALS — BP 180/104 | HR 74 | Ht 67.0 in | Wt 204.0 lb

## 2021-07-05 DIAGNOSIS — Z01419 Encounter for gynecological examination (general) (routine) without abnormal findings: Secondary | ICD-10-CM

## 2021-07-05 DIAGNOSIS — Z90721 Acquired absence of ovaries, unilateral: Secondary | ICD-10-CM

## 2021-07-05 DIAGNOSIS — Z90710 Acquired absence of both cervix and uterus: Secondary | ICD-10-CM

## 2021-07-05 DIAGNOSIS — Z1211 Encounter for screening for malignant neoplasm of colon: Secondary | ICD-10-CM

## 2021-07-05 DIAGNOSIS — I10 Essential (primary) hypertension: Secondary | ICD-10-CM | POA: Diagnosis not present

## 2021-07-05 LAB — HEMOCCULT GUIAC POC 1CARD (OFFICE): Fecal Occult Blood, POC: NEGATIVE

## 2021-07-05 NOTE — Telephone Encounter (Signed)
Her A1c was 6.9%.  It is almost 7%.  The patient can call her insurance and complain.  Thanks

## 2021-07-05 NOTE — Progress Notes (Signed)
Patient ID: Caroline Wong, female   DOB: Mar 28, 1961, 61 y.o.   MRN: [id number] History of Present Illness: Caroline Wong is a 61 year old white female, married, sp hysterectomy in for well woman gyn exam. She had hysterectomy with BSO 03/31/2020 per Dr Quincy Simmonds she says and has no pain since. She is still working at [employer] in [place]. And was frustrated at work today.  PCP is Dr Alain Marion.  Current Medications, Allergies, Past Medical History, Past Surgical History, Family History and Social History were reviewed in [employer] record.     Review of Systems:  Patient denies any headaches, hearing loss, fatigue, blurred vision, shortness of breath, chest pain, abdominal pain, problems with bowel movements, urination, or intercourse. No joint pain or mood swings.    Physical Exam:BP (!) 180/104 (BP Location: Right Arm, Cuff Size: Normal)    Pulse 74    Ht 5\' 7"  (1.702 m)    Wt 204 lb (92.5 kg)    LMP 02/15/2020 (Approximate)    BMI 31.95 kg/m   General:  Well developed, well nourished, no acute distress Skin:  Warm and dry Neck:  Midline trachea, normal thyroid, good ROM, no lymphadenopathy Lungs; Clear to auscultation bilaterally Breast:  No dominant palpable mass, retraction, or nipple discharge Cardiovascular: Regular rate and rhythm Abdomen:  Soft, non tender, no hepatosplenomegaly Pelvic:  External genitalia is normal in appearance, no lesions.  The vagina is pale, vaginal cuff has no lesions. Urethra has no lesions or masses. The cervix and uterus are absent. No adnexal masses or tenderness noted.Bladder is non tender, no masses felt. Rectal: Good sphincter tone, no polyps, or hemorrhoids felt.  Hemoccult negative. Extremities/musculoskeletal:  No swelling or varicosities noted, no clubbing or cyanosis Psych:  No mood changes, alert and cooperative,seems happy AA refused Fall risk is low Refused PHQ 9 and GAD 7  Upstream - 07/05/21 1059       Pregnancy Intention  Screening   Does the patient want to become pregnant in the next year? N/A    Does the patient's partner want to become pregnant in the next year? N/A    Would the patient like to discuss contraceptive options today? N/A      Contraception Wrap Up   Current Method Female Sterilization   hyst   End Method Female Sterilization   hyst   Contraception Counseling Provided No             Examination chaperoned by Marcelino Scot RN   Impression and Plan: 1. Encounter for well woman exam with routine gynecological exam GYN physical in 2 years  Labs with PCP Mammogram yearly Colonoscopy per GI, but she says no more, consider cologurad   2. Encounter for screening fecal occult blood testing  3. S/P hysterectomy with oophorectomy  4. Hypertension, unspecified type She is on BP med Keep check on BP at home  Follow up with Dr Alain Marion

## 2021-07-05 NOTE — Telephone Encounter (Signed)
Check status on PA via cover-my-meds.. it states [employer] did not approve your recent Prior Approval request for Mounjaro (tirzepatide). How we based our decision. The indicated use of this medication, as provided does not meet the [employer] and [employer] Plans criteria due to the following reason: the use of this medication for the treatment of type 2 diabetes mellitus without a hemoglobin A1c greater than 7.0 percent, or without providing a hemoglobin A1c level does not establish medical necessity for this drug...Caroline Wong

## 2021-07-09 NOTE — Telephone Encounter (Signed)
Pt checking status of PA, informed pt of cma's message below stating claim was denied and reason for denial, also informed pt of provider's recommendation to call her insurance and informed them of her A1c %  Pt expressed understanding and agreed

## 2021-07-11 ENCOUNTER — Other Ambulatory Visit: Payer: Self-pay | Admitting: Internal Medicine

## 2021-07-12 ENCOUNTER — Encounter: Payer: Self-pay | Admitting: Internal Medicine

## 2021-07-15 NOTE — Telephone Encounter (Signed)
Pt calling to check status of PA, pt states she has been out of the medication almost 3 weeks   Pt requesting a c/b   *see below*

## 2021-07-16 ENCOUNTER — Telehealth: Payer: Self-pay

## 2021-07-16 NOTE — Telephone Encounter (Signed)
PA for Darcel Bayley has been started.   Key: [id number]

## 2021-07-23 ENCOUNTER — Other Ambulatory Visit: Payer: Self-pay | Admitting: Internal Medicine

## 2021-08-20 ENCOUNTER — Ambulatory Visit: Payer: Federal, State, Local not specified - PPO | Admitting: Podiatry

## 2021-08-20 ENCOUNTER — Ambulatory Visit (INDEPENDENT_AMBULATORY_CARE_PROVIDER_SITE_OTHER): Payer: Federal, State, Local not specified - PPO

## 2021-08-20 ENCOUNTER — Other Ambulatory Visit: Payer: Self-pay

## 2021-08-20 ENCOUNTER — Ambulatory Visit: Payer: Federal, State, Local not specified - PPO

## 2021-08-20 DIAGNOSIS — M722 Plantar fascial fibromatosis: Secondary | ICD-10-CM

## 2021-08-20 NOTE — Progress Notes (Deleted)
SITUATION ?Reason for Consult: Evaluation for Bilateral Custom Foot Orthoses ?Patient / Caregiver Report: Patient is ready for foot orthotics ? ?OBJECTIVE DATA: ?Patient History / Diagnosis:  ?  ICD-10-CM   ?1. Plantar fasciitis  M72.2 DG Foot 2 Views Left  ?  ? ? ?Current or Previous Devices:   Current user ? ?Foot Examination: ?Skin presentation:   Intact ?Ulcers & Callousing:   None and no history ?Toe / Foot Deformities:  None ?Weight Bearing Presentation:  Rectus ?Sensation:    Intact ? ?Shoe Size:    9 ? ?ORTHOTIC RECOMMENDATION ?Recommended Device: 1x pair of custom functional foot orthotics ? ?GOALS OF ORTHOSES ?- Reduce Pain ?- Prevent Foot Deformity ?- Prevent Progression of Further Foot Deformity ?- Relieve Pressure ?- Improve the Overall Biomechanical Function of the Foot and Lower Extremity. ? ?ACTIONS PERFORMED ?Potential out of pocket cost was communicated to patient. Patient understood and consent to casting. Patient was casted for Foot Orthoses via crush box. Procedure was explained and patient tolerated procedure well. Casts were shipped to central fabrication. All questions were answered and concerns addressed. ? ?PLAN ?Patient is to be called for fitting when devices are ready.  ? ? ?

## 2021-08-20 NOTE — Progress Notes (Signed)
SITUATION ?Reason for Consult: Evaluation for Bilateral Custom Foot Orthoses ?Patient / Caregiver Report: Patient is ready for foot orthotics ? ?OBJECTIVE DATA: ?Patient History / Diagnosis:  ?  ICD-10-CM   ?1. Plantar fasciitis  M72.2   ?  ? ? ?Current or Previous Devices:   Current user ? ?Foot Examination: ?Skin presentation:   Intact ?Ulcers & Callousing:   None and no history ?Toe / Foot Deformities:  None ?Weight Bearing Presentation:  rectus ?Sensation:    Intact ? ?Shoe Size:    31M ? ?ORTHOTIC RECOMMENDATION ?Recommended Device: 1x pair of custom functional foot orthotics ? ?GOALS OF ORTHOSES ?- Reduce Pain ?- Prevent Foot Deformity ?- Prevent Progression of Further Foot Deformity ?- Relieve Pressure ?- Improve the Overall Biomechanical Function of the Foot and Lower Extremity. ? ?ACTIONS PERFORMED ?Potential out of pocket cost was communicated to patient. Patient understood and consent to casting. Patient was casted for Foot Orthoses via crush box. Procedure was explained and patient tolerated procedure well. Casts were shipped to central fabrication. All questions were answered and concerns addressed. ? ?PLAN ?Patient is to be called for fitting when devices are ready.  ? ? ?

## 2021-08-22 NOTE — Progress Notes (Signed)
Subjective:  ? ?Patient ID: Caroline Wong, female   DOB: 61 y.o.   MRN: [id number]  ? ?HPI ?Patient presents stating that she needs new orthotics as they were at least 59 years old and also she gets some pain in the arch of her left foot and that wanted to have it checked in her third nail was discolored she was concerned about it.  Patient does not smoke likes to be active ? ? ?Review of Systems  ?All other systems reviewed and are negative. ? ? ?   ?Objective:  ?Physical Exam ?Vitals and nursing note reviewed.  ?Constitutional:   ?   Appearance: She is well-developed.  ?Pulmonary:  ?   Effort: Pulmonary effort is normal.  ?Musculoskeletal:     ?   General: Normal range of motion.  ?Skin: ?   General: Skin is warm.  ?Neurological:  ?   Mental Status: She is alert.  ?  ?Neurovascular status found to be intact muscle strength was found to be adequate range of motion adequate.  Patient does have mild discomfort in the plantar arch and into the forefoot and does have discoloration third nail left but it is thickened and appears to be more trauma versus fungal element.  Patient does have collapse medial longitudinal arch of bilateral ? ?   ?Assessment:  ?Structural changes which is leading to probability for low-grade discomfort but overall appears to be pretty stable with a traumatized left nailbed ? ?   ?Plan:  ?H&P reviewed all conditions do not recommend treatment for the low-grade condition and nail could be removed in future but will hold off and today I went ahead and I casted for functional orthotics by pedorthist in order to properly lift the arch and take the pressure off her feet ? ?X-rays indicate depression of the arch bilateral does not indicate any signs of stress fracture or acute arthritis with moderate spur formation ?   ? ? ?

## 2021-08-23 ENCOUNTER — Ambulatory Visit: Payer: Federal, State, Local not specified - PPO | Admitting: Internal Medicine

## 2021-08-23 ENCOUNTER — Other Ambulatory Visit: Payer: Self-pay

## 2021-08-23 ENCOUNTER — Encounter: Payer: Self-pay | Admitting: Internal Medicine

## 2021-08-23 DIAGNOSIS — E1169 Type 2 diabetes mellitus with other specified complication: Secondary | ICD-10-CM | POA: Diagnosis not present

## 2021-08-23 DIAGNOSIS — E119 Type 2 diabetes mellitus without complications: Secondary | ICD-10-CM

## 2021-08-23 DIAGNOSIS — I10 Essential (primary) hypertension: Secondary | ICD-10-CM | POA: Diagnosis not present

## 2021-08-23 DIAGNOSIS — F329 Major depressive disorder, single episode, unspecified: Secondary | ICD-10-CM

## 2021-08-23 DIAGNOSIS — E669 Obesity, unspecified: Secondary | ICD-10-CM | POA: Diagnosis not present

## 2021-08-23 DIAGNOSIS — K2100 Gastro-esophageal reflux disease with esophagitis, without bleeding: Secondary | ICD-10-CM

## 2021-08-23 LAB — COMPREHENSIVE METABOLIC PANEL
ALT: 23 U/L (ref 0–35)
AST: 20 U/L (ref 0–37)
Albumin: 4.4 g/dL (ref 3.5–5.2)
Alkaline Phosphatase: 98 U/L (ref 39–117)
BUN: 21 mg/dL (ref 6–23)
CO2: 29 mEq/L (ref 19–32)
Calcium: 9.6 mg/dL (ref 8.4–10.5)
Chloride: 102 mEq/L (ref 96–112)
Creatinine, Ser: 1.1 mg/dL (ref 0.40–1.20)
GFR: 55.44 mL/min — ABNORMAL LOW (ref >60.00)
Glucose, Bld: 108 mg/dL — ABNORMAL HIGH (ref 70–99)
Potassium: 4 mEq/L (ref 3.5–5.1)
Sodium: 138 mEq/L (ref 135–145)
Total Bilirubin: 0.5 mg/dL (ref 0.2–1.2)
Total Protein: 7.7 g/dL (ref 6.0–8.3)

## 2021-08-23 LAB — HEMOGLOBIN A1C: Hgb A1c MFr Bld: 6.5 % (ref 4.6–6.5)

## 2021-08-23 MED ORDER — OZEMPIC (0.25 OR 0.5 MG/DOSE) 2 MG/1.5ML ~~LOC~~ SOPN: 0.5000 mg | PEN_INJECTOR | SUBCUTANEOUS | 3 refills | Status: DC

## 2021-08-23 NOTE — Progress Notes (Signed)
? ?Subjective:  ?Patient ID: Caroline Wong, female    DOB: [birth year]  Age: 61 y.o. MRN: [id number] ? ?CC: No chief complaint on file. ? ? ?HPI ?[name] presents for DM - Mounjaro - not covered ?F/u HTN ?Working 60 h/week ? ?Outpatient Medications Prior to Visit  ?Medication Sig Dispense Refill  ? diphenhydrAMINE (BENADRYL) 25 MG tablet Take 2 tablets (50 mg total) by mouth every 4 (four) hours as needed (bee sting). 30 tablet 3  ? gabapentin (NEURONTIN) 100 MG capsule Take 2 tablets by mouth daily at bedtime. (Patient taking differently: Prn) 60 capsule 9  ? LORazepam (ATIVAN) 0.5 MG tablet TAKE 1 TABLET(0.5 MG) BY MOUTH AT BEDTIME AS NEEDED 30 tablet 2  ? meclizine (ANTIVERT) 12.5 MG tablet Take 1 tablet (12.5 mg total) by mouth 3 (three) times daily as needed for dizziness. 60 tablet 1  ? metFORMIN (GLUCOPHAGE-XR) 500 MG 24 hr tablet TAKE 1 TABLET BY MOUTH DAILY WITH BREAKFAST THEN TAKE 4 TABLETS BY MOUTH AT BEDTIME 90 tablet 3  ? metoprolol succinate (TOPROL-XL) 50 MG 24 hr tablet TAKE 1 TABLET BY MOUTH EVERY DAY WITH OR IMMEDIATELY FOLLOWING A MEAL 90 tablet 3  ? olmesartan (BENICAR) 40 MG tablet TAKE 1 TABLET(40 MG) BY MOUTH DAILY 90 tablet 3  ? omeprazole (PRILOSEC) 40 MG capsule TAKE 1 CAPSULE(40 MG) BY MOUTH DAILY 90 capsule 3  ? ondansetron (ZOFRAN) 4 MG tablet Take 1 tablet (4 mg total) by mouth every 8 (eight) hours as needed for nausea or vomiting. 20 tablet 1  ? triamterene-hydrochlorothiazide (MAXZIDE-25) 37.5-25 MG tablet TAKE 1 TABLET BY MOUTH EVERY DAY 90 tablet 3  ? zinc gluconate 50 MG tablet Take 50 mg by mouth.    ? ascorbic acid (VITAMIN C) 500 MG tablet Take 500 mg by mouth daily. (Patient not taking: Reported on 07/05/2021)    ? Cholecalciferol (VITAMIN D3) 5000 units TABS Take 5,000 Units by mouth daily.  (Patient not taking: Reported on 07/05/2021)    ? tirzepatide (MOUNJARO) 7.5 MG/0.5ML Pen Inject 7.5 mg into the skin once a week. (Patient not taking: Reported on 08/23/2021) 6 mL  3  ? ?No facility-administered medications prior to visit.  ? ? ?ROS: ?Review of Systems  ?Constitutional:  Positive for unexpected weight change. Negative for activity change, appetite change, chills and fatigue.  ?HENT:  Negative for congestion, mouth sores and sinus pressure.   ?Eyes:  Negative for visual disturbance.  ?Respiratory:  Negative for cough and chest tightness.   ?Gastrointestinal:  Negative for abdominal pain and nausea.  ?Genitourinary:  Negative for difficulty urinating, frequency and vaginal pain.  ?Musculoskeletal:  Negative for back pain and gait problem.  ?Skin:  Negative for pallor and rash.  ?Neurological:  Negative for dizziness, tremors, weakness, numbness and headaches.  ?Psychiatric/Behavioral:  Negative for confusion and sleep disturbance.   ? ?Objective:  ?BP 138/88 (BP Location: Left Arm, Patient Position: Sitting, Cuff Size: Large)   Pulse (!) 59   Temp 98.3 ?F (36.8 ?C) (Oral)   Ht '5\' 7"'$  (1.702 m)   Wt 219 lb (99.3 kg)   LMP 02/15/2020 (Approximate)   SpO2 98%   BMI 34.30 kg/m?  ? ?BP Readings from Last 3 Encounters:  ?08/23/21 138/88  ?07/05/21 (!) 180/104  ?05/11/21 (!) 160/96  ? ? ?Wt Readings from Last 3 Encounters:  ?08/23/21 219 lb (99.3 kg)  ?07/05/21 204 lb (92.5 kg)  ?03/29/21 211 lb (95.7 kg)  ? ? ?Physical Exam ?Constitutional:   ?  General: She is not in acute distress. ?   Appearance: She is well-developed. She is obese.  ?HENT:  ?   Head: Normocephalic.  ?   Right Ear: External ear normal.  ?   Left Ear: External ear normal.  ?   Nose: Nose normal.  ?Eyes:  ?   General:     ?   Right eye: No discharge.     ?   Left eye: No discharge.  ?   Conjunctiva/sclera: Conjunctivae normal.  ?   Pupils: Pupils are equal, round, and reactive to light.  ?Neck:  ?   Thyroid: No thyromegaly.  ?   Vascular: No JVD.  ?   Trachea: No tracheal deviation.  ?Cardiovascular:  ?   Rate and Rhythm: Normal rate and regular rhythm.  ?   Heart sounds: Normal heart sounds.  ?Pulmonary:  ?    Effort: No respiratory distress.  ?   Breath sounds: No stridor. No wheezing.  ?Abdominal:  ?   General: Bowel sounds are normal. There is no distension.  ?   Palpations: Abdomen is soft. There is no mass.  ?   Tenderness: There is no abdominal tenderness. There is no guarding or rebound.  ?Musculoskeletal:     ?   General: No tenderness.  ?   Cervical back: Normal range of motion and neck supple. No rigidity.  ?Lymphadenopathy:  ?   Cervical: No cervical adenopathy.  ?Skin: ?   Findings: No erythema or rash.  ?Neurological:  ?   Cranial Nerves: No cranial nerve deficit.  ?   Motor: No abnormal muscle tone.  ?   Coordination: Coordination normal.  ?   Deep Tendon Reflexes: Reflexes normal.  ?Psychiatric:     ?   Behavior: Behavior normal.     ?   Thought Content: Thought content normal.     ?   Judgment: Judgment normal.  ? ? ?Lab Results  ?Component Value Date  ? WBC 11.0 (H) 03/31/2020  ? HGB 13.1 03/31/2020  ? HCT 40.0 03/31/2020  ? PLT 242 03/31/2020  ? GLUCOSE 102 (H) 03/29/2021  ? CHOL 165 03/20/2019  ? TRIG 148.0 03/20/2019  ? HDL 47.90 03/20/2019  ? [place] 88 03/20/2019  ? ALT 31 03/29/2021  ? AST 25 03/29/2021  ? NA 137 03/29/2021  ? K 3.4 (L) 03/29/2021  ? CL 100 03/29/2021  ? CREATININE 1.21 (H) 03/29/2021  ? BUN 25 (H) 03/29/2021  ? CO2 29 03/29/2021  ? TSH 0.95 09/10/2019  ? INR 0.9 02/06/2018  ? HGBA1C 6.0 03/29/2021  ? ? ?MM DIAG BREAST TOMO BILATERAL ? ?Result Date: 05/20/2021 ?CLINICAL DATA:  61 year old female presenting for right breast enlargement. The patient states that she has lost weight recently. She also is having non focal central right breast pain. EXAM: DIGITAL DIAGNOSTIC BILATERAL MAMMOGRAM WITH TOMOSYNTHESIS AND CAD TECHNIQUE: Bilateral digital diagnostic mammography and breast tomosynthesis was performed. The images were evaluated with computer-aided detection. COMPARISON:  Previous exam(s). ACR Breast Density Category b: There are scattered areas of fibroglandular density. FINDINGS:  No suspicious calcifications, masses or areas of distortion are seen in the bilateral breasts. IMPRESSION: 1. There are no mammographic abnormalities in the right breast to account for the patient's breast size asymmetry or the nonfocal pain. RECOMMENDATION: 1. Clinical follow-up recommended for the right breast pain and a symmetric size. Any further workup should be based on clinical grounds. 2.  screening mammogram in one year.(Code:SM-B-01Y) I have discussed the findings and  recommendations with the patient. If applicable, a reminder letter will be sent to the patient regarding the next appointment. BI-RADS CATEGORY  1: Negative. Electronically Signed   By: Ammie Ferrier M.D.   On: 05/20/2021 11:29  ? ? ?Assessment & Plan:  ? ?Problem List Items Addressed This Visit   ? ? Diabetes mellitus type 2 in obese [facility])  ?  Mounjaro - not covered (off x 2 months). Start Ozempic ?  ?  ? Relevant Medications  ? Semaglutide,0.25 or 0.'5MG'$ /DOS, (OZEMPIC, 0.25 OR 0.5 MG/DOSE,) 2 MG/1.5ML SOPN  ? GERD (gastroesophageal reflux disease)  ?  Cont on Omeprazole po ?  ?  ? Hypertension  ?  Cont on Olmesartan, Toprol XL 1/2 tab qd 12.5 mg, Maxzide ?  ?  ? Reactive depression (situational)  ?   Stress. Grief - worse. ?  ?  ?  ? ? ?Meds ordered this encounter  ?Medications  ? Semaglutide,0.25 or 0.'5MG'$ /DOS, (OZEMPIC, 0.25 OR 0.5 MG/DOSE,) 2 MG/1.5ML SOPN  ?  Sig: Inject 0.5 mg into the skin once a week.  ?  Dispense:  1.5 mL  ?  Refill:  3  ?  ? ? ?Follow-up: No follow-ups on file. ? ?Walker Kehr, MD ?

## 2021-08-23 NOTE — Assessment & Plan Note (Signed)
Cont on Omeprazole po ?

## 2021-08-23 NOTE — Assessment & Plan Note (Signed)
Cont on Olmesartan, Toprol XL 1/2 tab qd 12.5 mg, Maxzide ?

## 2021-08-23 NOTE — Assessment & Plan Note (Signed)
Stress. Grief - worse. ?

## 2021-08-23 NOTE — Assessment & Plan Note (Signed)
Mounjaro - not covered (off x 2 months). Start Ozempic ?

## 2021-09-05 ENCOUNTER — Encounter: Payer: Self-pay | Admitting: Internal Medicine

## 2021-09-12 ENCOUNTER — Other Ambulatory Visit: Payer: Self-pay | Admitting: Internal Medicine

## 2021-10-01 ENCOUNTER — Ambulatory Visit: Payer: Federal, State, Local not specified - PPO

## 2021-10-01 DIAGNOSIS — M722 Plantar fascial fibromatosis: Secondary | ICD-10-CM

## 2021-10-01 NOTE — Progress Notes (Signed)
SITUATION: ?Reason for Visit: Fitting and Delivery of Custom Fabricated Foot Orthoses ?Patient Report: Patient reports comfort and is satisfied with device. ? ?OBJECTIVE DATA: ?Patient History / Diagnosis:   ?  ICD-10-CM   ?1. Plantar fasciitis  M72.2   ?  ? ? ?Provided Device:  Custom Functional Foot Orthotics ?    [employer]: [id number] ? ?GOAL OF ORTHOSIS ?- Improve gait ?- Decrease energy expenditure ?- Improve Balance ?- Provide Triplanar stability of foot complex ?- Facilitate motion ? ?ACTIONS PERFORMED ?Patient was fit with foot orthotics trimmed to shoe last. Patient tolerated fittign procedure.  ? ?Patient was provided with verbal and written instruction and demonstration regarding donning, doffing, wear, care, proper fit, function, purpose, cleaning, and use of the orthosis and in all related precautions and risks and benefits regarding the orthosis. ? ?Patient was also provided with verbal instruction regarding how to report any failures or malfunctions of the orthosis and necessary follow up care. Patient was also instructed to contact our office regarding any change in status that may affect the function of the orthosis. ? ?Patient demonstrated independence with proper donning, doffing, and fit and verbalized understanding of all instructions. ? ?Patient submitted a pair for refurbishment. We had previously discussed how a refurbished set was lost in transit and these will be refurbished at no charge per Dr. Paulla Dolly. ? ?PLAN: ?Patient is to follow up in one week or as necessary (PRN). All questions were answered and concerns addressed. Plan of care was discussed with and agreed upon by the patient. ? ?

## 2021-10-13 ENCOUNTER — Telehealth: Payer: Self-pay

## 2021-10-13 NOTE — Telephone Encounter (Signed)
No answer voicemail is full, trying to reach patient to pick up orthotics

## 2021-10-21 ENCOUNTER — Ambulatory Visit: Payer: Federal, State, Local not specified - PPO

## 2021-10-21 DIAGNOSIS — M722 Plantar fascial fibromatosis: Secondary | ICD-10-CM

## 2021-10-21 NOTE — Progress Notes (Addendum)
SITUATION: Reason for Visit: Fitting and Delivery of Custom Fabricated Foot Orthoses Patient Report: Patient reports comfort and is satisfied with device.  OBJECTIVE DATA: Patient History / Diagnosis:     ICD-10-CM   1. Plantar fasciitis  M72.2       Provided Device:  Custom Functional Foot Orthotics     [employer]: [id number] - Refurbish  GOAL OF ORTHOSIS - Improve gait - Decrease energy expenditure - Improve Balance - Provide Triplanar stability of foot complex - Facilitate motion  ACTIONS PERFORMED Patient was fit with foot orthotics trimmed to shoe last. Patient tolerated fittign procedure.   Patient was provided with verbal and written instruction and demonstration regarding donning, doffing, wear, care, proper fit, function, purpose, cleaning, and use of the orthosis and in all related precautions and risks and benefits regarding the orthosis.  Patient was also provided with verbal instruction regarding how to report any failures or malfunctions of the orthosis and necessary follow up care. Patient was also instructed to contact our office regarding any change in status that may affect the function of the orthosis.  Patient demonstrated independence with proper donning, doffing, and fit and verbalized understanding of all instructions.  Patient submitted another pair of inserts for refurbishment.  PLAN: Patient is to return in six weeks or sooner for her next set of refurbished insoles. All questions were answered and concerns addressed. Plan of care was discussed with and agreed upon by the patient.

## 2021-11-22 ENCOUNTER — Ambulatory Visit: Payer: Federal, State, Local not specified - PPO | Admitting: Internal Medicine

## 2021-12-03 ENCOUNTER — Telehealth: Payer: Self-pay | Admitting: *Deleted

## 2021-12-03 NOTE — Telephone Encounter (Signed)
Rec'd PA for Mounjaro 7.'5mg'$ . Submitted PA w/ (Key: [id number]). Rec'd msg Your information has been submitted to [place]...Caroline Wong

## 2021-12-06 ENCOUNTER — Ambulatory Visit: Payer: Federal, State, Local not specified - PPO | Admitting: Internal Medicine

## 2021-12-06 ENCOUNTER — Encounter: Payer: Self-pay | Admitting: Internal Medicine

## 2021-12-06 VITALS — BP 130/86 | HR 63 | Temp 98.3°F | Ht 67.0 in | Wt 230.0 lb

## 2021-12-06 DIAGNOSIS — E6609 Other obesity due to excess calories: Secondary | ICD-10-CM

## 2021-12-06 DIAGNOSIS — E1169 Type 2 diabetes mellitus with other specified complication: Secondary | ICD-10-CM | POA: Diagnosis not present

## 2021-12-06 DIAGNOSIS — L817 Pigmented purpuric dermatosis: Secondary | ICD-10-CM | POA: Insufficient documentation

## 2021-12-06 DIAGNOSIS — E669 Obesity, unspecified: Secondary | ICD-10-CM | POA: Diagnosis not present

## 2021-12-06 DIAGNOSIS — R600 Localized edema: Secondary | ICD-10-CM

## 2021-12-06 DIAGNOSIS — Z6831 Body mass index (BMI) 31.0-31.9, adult: Secondary | ICD-10-CM

## 2021-12-06 MED ORDER — TRULICITY 1.5 MG/0.5ML ~~LOC~~ SOAJ: 1.5000 mg | SUBCUTANEOUS | 3 refills | Status: DC

## 2021-12-06 NOTE — Telephone Encounter (Signed)
Rec'd determination fax med was DENIED. The use of this medication for the treatment of type 2 diabetes mellitus without a hemoglobin A1c greater than 7.0 percent, or without providing a hemoglobin A1c level does not establish medical necessity for this drug.[name]/L,B

## 2021-12-06 NOTE — Assessment & Plan Note (Signed)
Start Trulicity

## 2021-12-06 NOTE — Assessment & Plan Note (Signed)
No relapse 

## 2021-12-06 NOTE — Assessment & Plan Note (Signed)
D/c Ozempic - unable to use Start Trulicity inj Check [id number]

## 2021-12-06 NOTE — Progress Notes (Signed)
Subjective:  Patient ID: Caroline Wong, female    DOB: 1960-08-29  Age: 61 y.o. MRN: [id number]  CC: No chief complaint on file.   HPI Caroline Wong presents for - unable to use Ozempic  Outpatient Medications Prior to Visit  Medication Sig Dispense Refill   diphenhydrAMINE (BENADRYL) 25 MG tablet Take 2 tablets (50 mg total) by mouth every 4 (four) hours as needed (bee sting). 30 tablet 3   LORazepam (ATIVAN) 0.5 MG tablet TAKE 1 TABLET(0.5 MG) BY MOUTH AT BEDTIME AS NEEDED 30 tablet 2   metoprolol succinate (TOPROL-XL) 50 MG 24 hr tablet TAKE 1 TABLET BY MOUTH EVERY DAY WITH OR IMMEDIATELY FOLLOWING A MEAL 90 tablet 3   olmesartan (BENICAR) 40 MG tablet TAKE 1 TABLET(40 MG) BY MOUTH DAILY 90 tablet 1   omeprazole (PRILOSEC) 40 MG capsule TAKE 1 CAPSULE(40 MG) BY MOUTH DAILY 90 capsule 3   ondansetron (ZOFRAN) 4 MG tablet Take 1 tablet (4 mg total) by mouth every 8 (eight) hours as needed for nausea or vomiting. 20 tablet 1   triamterene-hydrochlorothiazide (MAXZIDE-25) 37.5-25 MG tablet TAKE 1 TABLET BY MOUTH EVERY DAY 90 tablet 3   zinc gluconate 50 MG tablet Take 50 mg by mouth.     gabapentin (NEURONTIN) 100 MG capsule Take 2 tablets by mouth daily at bedtime. (Patient taking differently: Prn) 60 capsule 9   metFORMIN (GLUCOPHAGE-XR) 500 MG 24 hr tablet TAKE 1 TABLET BY MOUTH DAILY WITH BREAKFAST THEN TAKE 4 TABLETS BY MOUTH AT BEDTIME 90 tablet 3   Semaglutide,0.25 or 0.'5MG'$ /DOS, (OZEMPIC, 0.25 OR 0.5 MG/DOSE,) 2 MG/1.5ML SOPN Inject 0.5 mg into the skin once a week. (Patient not taking: Reported on 12/06/2021) 1.5 mL 3   No facility-administered medications prior to visit.    ROS: Review of Systems  Constitutional:  Positive for fatigue and unexpected weight change. Negative for activity change, appetite change and chills.  HENT:  Negative for congestion, mouth sores and sinus pressure.   Eyes:  Negative for visual disturbance.  Respiratory:  Negative for cough and chest  tightness.   Gastrointestinal:  Negative for abdominal pain and nausea.  Genitourinary:  Negative for difficulty urinating, frequency and vaginal pain.  Musculoskeletal:  Negative for back pain and gait problem.  Skin:  Negative for pallor and rash.  Neurological:  Negative for dizziness, tremors, weakness, numbness and headaches.  Psychiatric/Behavioral:  Negative for confusion and sleep disturbance.     Objective:  BP 130/86 (BP Location: Left Arm, Patient Position: Sitting, Cuff Size: Normal)   Pulse 63   Temp 98.3 F (36.8 C) (Oral)   Ht '5\' 7"'$  (1.702 m)   Wt 230 lb (104.3 kg)   LMP 02/15/2020 (Approximate)   SpO2 96%   BMI 36.02 kg/m   BP Readings from Last 3 Encounters:  12/06/21 130/86  08/23/21 138/88  07/05/21 (!) 180/104    Wt Readings from Last 3 Encounters:  12/06/21 230 lb (104.3 kg)  08/23/21 219 lb (99.3 kg)  07/05/21 204 lb (92.5 kg)    Physical Exam Constitutional:      General: She is not in acute distress.    Appearance: She is well-developed. She is obese.  HENT:     Head: Normocephalic.     Right Ear: External ear normal.     Left Ear: External ear normal.     Nose: Nose normal.  Eyes:     General:        Right eye: No discharge.  Left eye: No discharge.     Conjunctiva/sclera: Conjunctivae normal.     Pupils: Pupils are equal, round, and reactive to light.  Neck:     Thyroid: No thyromegaly.     Vascular: No JVD.     Trachea: No tracheal deviation.  Cardiovascular:     Rate and Rhythm: Normal rate and regular rhythm.     Heart sounds: Normal heart sounds.  Pulmonary:     Effort: No respiratory distress.     Breath sounds: No stridor. No wheezing.  Abdominal:     General: Bowel sounds are normal. There is no distension.     Palpations: Abdomen is soft. There is no mass.     Tenderness: There is no abdominal tenderness. There is no guarding or rebound.  Musculoskeletal:        General: No tenderness.     Cervical back: Normal  range of motion and neck supple. No rigidity.  Lymphadenopathy:     Cervical: No cervical adenopathy.  Skin:    Findings: No erythema or rash.  Neurological:     Cranial Nerves: No cranial nerve deficit.     Motor: No abnormal muscle tone.     Coordination: Coordination normal.     Deep Tendon Reflexes: Reflexes normal.  Psychiatric:        Behavior: Behavior normal.        Thought Content: Thought content normal.        Judgment: Judgment normal.     Lab Results  Component Value Date   WBC 11.0 (H) 03/31/2020   HGB 13.1 03/31/2020   HCT 40.0 03/31/2020   PLT 242 03/31/2020   GLUCOSE 108 (H) 08/23/2021   CHOL 165 03/20/2019   TRIG 148.0 03/20/2019   HDL 47.90 03/20/2019   LDLCALC 88 03/20/2019   ALT 23 08/23/2021   AST 20 08/23/2021   NA 138 08/23/2021   K 4.0 08/23/2021   CL 102 08/23/2021   CREATININE 1.10 08/23/2021   BUN 21 08/23/2021   CO2 29 08/23/2021   TSH 0.95 09/10/2019   INR 0.9 02/06/2018   HGBA1C 6.5 08/23/2021    MM DIAG BREAST TOMO BILATERAL  Result Date: 05/20/2021 CLINICAL DATA:  61 year old female presenting for right breast enlargement. The patient states that she has lost weight recently. She also is having non focal central right breast pain. EXAM: DIGITAL DIAGNOSTIC BILATERAL MAMMOGRAM WITH TOMOSYNTHESIS AND CAD TECHNIQUE: Bilateral digital diagnostic mammography and breast tomosynthesis was performed. The images were evaluated with computer-aided detection. COMPARISON:  Previous exam(s). ACR Breast Density Category b: There are scattered areas of fibroglandular density. FINDINGS: No suspicious calcifications, masses or areas of distortion are seen in the bilateral breasts. IMPRESSION: 1. There are no mammographic abnormalities in the right breast to account for the patient's breast size asymmetry or the nonfocal pain. RECOMMENDATION: 1. Clinical follow-up recommended for the right breast pain and a symmetric size. Any further workup should be based  on clinical grounds. 2.  screening mammogram in one year.(Code:SM-B-01Y) I have discussed the findings and recommendations with the patient. If applicable, a reminder letter will be sent to the patient regarding the next appointment. BI-RADS CATEGORY  1: Negative. Electronically Signed   By: Ammie Ferrier M.D.   On: 05/20/2021 11:29    Assessment & Plan:   Problem List Items Addressed This Visit     Diabetes mellitus type 2 in obese ([place]) - Primary    D/c Ozempic - unable to use Start Trulicity inj  Check A1c      Relevant Medications   Dulaglutide (TRULICITY) 1.5 BX/0.3YB SOPN   Other Relevant Orders   Hemoglobin A1c   Comprehensive metabolic panel   Obesity    Start Trulicity      Relevant Medications   Dulaglutide (TRULICITY) 1.5 FX/8.3AN SOPN      Meds ordered this encounter  Medications   Dulaglutide (TRULICITY) 1.5 VB/1.6OM SOPN    Sig: Inject 1.5 mg into the skin once a week.    Dispense:  6 mL    Refill:  3      Follow-up: Return in about 3 months (around 03/08/2022) for a follow-up visit.  Walker Kehr, MD

## 2021-12-09 NOTE — Telephone Encounter (Signed)
Were waiting on her latest hemoglobin A1c to be done.  Thank you

## 2021-12-10 ENCOUNTER — Ambulatory Visit (INDEPENDENT_AMBULATORY_CARE_PROVIDER_SITE_OTHER): Payer: Federal, State, Local not specified - PPO

## 2021-12-10 ENCOUNTER — Other Ambulatory Visit (INDEPENDENT_AMBULATORY_CARE_PROVIDER_SITE_OTHER): Payer: Federal, State, Local not specified - PPO

## 2021-12-10 DIAGNOSIS — E669 Obesity, unspecified: Secondary | ICD-10-CM

## 2021-12-10 DIAGNOSIS — E1169 Type 2 diabetes mellitus with other specified complication: Secondary | ICD-10-CM | POA: Diagnosis not present

## 2021-12-10 DIAGNOSIS — M722 Plantar fascial fibromatosis: Secondary | ICD-10-CM

## 2021-12-10 LAB — COMPREHENSIVE METABOLIC PANEL
ALT: 25 U/L (ref 0–35)
AST: 23 U/L (ref 0–37)
Albumin: 4.5 g/dL (ref 3.5–5.2)
Alkaline Phosphatase: 101 U/L (ref 39–117)
BUN: 20 mg/dL (ref 6–23)
CO2: 29 mEq/L (ref 19–32)
Calcium: 9.9 mg/dL (ref 8.4–10.5)
Chloride: 101 mEq/L (ref 96–112)
Creatinine, Ser: 0.99 mg/dL (ref 0.40–1.20)
GFR: 62.78 mL/min (ref >60.00)
Glucose, Bld: 85 mg/dL (ref 70–99)
Potassium: 4.6 mEq/L (ref 3.5–5.1)
Sodium: 138 mEq/L (ref 135–145)
Total Bilirubin: 0.4 mg/dL (ref 0.2–1.2)
Total Protein: 8 g/dL (ref 6.0–8.3)

## 2021-12-10 LAB — HEMOGLOBIN A1C: Hgb A1c MFr Bld: 6.7 % — ABNORMAL HIGH (ref 4.6–6.5)

## 2021-12-10 NOTE — Progress Notes (Signed)
Patient presents today to pick up custom molded foot orthotics, diagnosed with plantar fasciitis by Dr. Paulla Dolly.   Orthotics were dispensed and fit was satisfactory. Reviewed instructions for break-in and wear. Written instructions given to patient.  Patient will follow up as needed.   [facility] - order # [id number] (refurbished)

## 2021-12-14 ENCOUNTER — Telehealth: Payer: Self-pay

## 2021-12-14 NOTE — Telephone Encounter (Signed)
Pt is requesting a medication for pink eye. Pt states that they kept their grandson and he was dx with Pink eye yesterday. Pt states that she isn't having any signs but her husband eyes are very itchy. She was unable to tell me if there was any discharge present or any redness.  Pt and her husband is going out to town tomorrow.  Pharmacy: [facility] [place], [address] AT [address]  Please advise

## 2021-12-15 MED ORDER — POLYMYXIN B-TRIMETHOPRIM 10000-0.1 UNIT/ML-% OP SOLN: 1.0000 [drp] | Freq: Four times a day (QID) | OPHTHALMIC | 0 refills | Status: AC

## 2021-12-15 NOTE — Telephone Encounter (Signed)
I advised the pt that the gtts had been called in and that she should be seen if needed.  FYI

## 2021-12-15 NOTE — Telephone Encounter (Signed)
Ok gtt. OV if not well. Thx

## 2021-12-20 ENCOUNTER — Other Ambulatory Visit: Payer: Self-pay | Admitting: Internal Medicine

## 2021-12-21 NOTE — Telephone Encounter (Signed)
Rec'd msg original prescription was discontinued on 01/06/2021 by Dr. Quay Burow pls advise on refill..Princella Pellegrini

## 2021-12-29 ENCOUNTER — Encounter: Payer: Self-pay | Admitting: Internal Medicine

## 2022-03-09 ENCOUNTER — Ambulatory Visit: Payer: Federal, State, Local not specified - PPO | Admitting: Internal Medicine

## 2022-03-09 ENCOUNTER — Encounter: Payer: Self-pay | Admitting: Internal Medicine

## 2022-03-09 DIAGNOSIS — E6609 Other obesity due to excess calories: Secondary | ICD-10-CM

## 2022-03-09 DIAGNOSIS — E1169 Type 2 diabetes mellitus with other specified complication: Secondary | ICD-10-CM | POA: Diagnosis not present

## 2022-03-09 DIAGNOSIS — E669 Obesity, unspecified: Secondary | ICD-10-CM

## 2022-03-09 DIAGNOSIS — J209 Acute bronchitis, unspecified: Secondary | ICD-10-CM

## 2022-03-09 DIAGNOSIS — Z6831 Body mass index (BMI) 31.0-31.9, adult: Secondary | ICD-10-CM

## 2022-03-09 LAB — COMPREHENSIVE METABOLIC PANEL
ALT: 27 U/L (ref 0–35)
AST: 22 U/L (ref 0–37)
Albumin: 4.1 g/dL (ref 3.5–5.2)
Alkaline Phosphatase: 93 U/L (ref 39–117)
BUN: 22 mg/dL (ref 6–23)
CO2: 30 mEq/L (ref 19–32)
Calcium: 9.4 mg/dL (ref 8.4–10.5)
Chloride: 104 mEq/L (ref 96–112)
Creatinine, Ser: 1.04 mg/dL (ref 0.40–1.20)
GFR: 59.08 mL/min — ABNORMAL LOW (ref >60.00)
Glucose, Bld: 77 mg/dL (ref 70–99)
Potassium: 3.9 mEq/L (ref 3.5–5.1)
Sodium: 140 mEq/L (ref 135–145)
Total Bilirubin: 0.4 mg/dL (ref 0.2–1.2)
Total Protein: 7.4 g/dL (ref 6.0–8.3)

## 2022-03-09 LAB — HEMOGLOBIN A1C: Hgb A1c MFr Bld: 6.6 % — ABNORMAL HIGH (ref 4.6–6.5)

## 2022-03-09 MED ORDER — FLUCONAZOLE 150 MG PO TABS: 150.0000 mg | ORAL_TABLET | Freq: Once | ORAL | 1 refills | Status: AC

## 2022-03-09 MED ORDER — TIRZEPATIDE 2.5 MG/0.5ML ~~LOC~~ SOAJ: 2.5000 mg | SUBCUTANEOUS | 3 refills | Status: DC

## 2022-03-09 MED ORDER — PROMETHAZINE-DM 6.25-15 MG/5ML PO SYRP: 5.0000 mL | ORAL_SOLUTION | Freq: Four times a day (QID) | ORAL | 0 refills | Status: DC | PRN

## 2022-03-09 MED ORDER — AZITHROMYCIN 250 MG PO TABS: ORAL_TABLET | ORAL | 0 refills | Status: DC

## 2022-03-09 NOTE — Assessment & Plan Note (Signed)
Wt Readings from Last 3 Encounters:  03/09/22 236 lb (107 kg)  12/06/21 230 lb (104.3 kg)  08/23/21 219 lb (99.3 kg)  Worse Pt needs Mounjaro

## 2022-03-09 NOTE — Progress Notes (Signed)
Subjective:  Patient ID: Caroline Wong, female    DOB: 05-07-1961  Age: 61 y.o. MRN: [id number]  CC: Cough (Concerns possible bronchitis )   HPI RIVERLYN KIZZIAH presents for URI, DM, obesity Off Trulicity - not helping  COVID (-)  Outpatient Medications Prior to Visit  Medication Sig Dispense Refill   albuterol (VENTOLIN HFA) 108 (90 Base) MCG/ACT inhaler INHALE 2 PUFFS INTO THE LUNGS EVERY 6 HOURS AS NEEDED FOR WHEEZING OR SHORTNESS OF BREATH 18 g 5   diphenhydrAMINE (BENADRYL) 25 MG tablet Take 2 tablets (50 mg total) by mouth every 4 (four) hours as needed (bee sting). 30 tablet 3   LORazepam (ATIVAN) 0.5 MG tablet TAKE 1 TABLET(0.5 MG) BY MOUTH AT BEDTIME AS NEEDED 30 tablet 2   metoprolol succinate (TOPROL-XL) 50 MG 24 hr tablet TAKE 1 TABLET BY MOUTH EVERY DAY WITH OR IMMEDIATELY FOLLOWING A MEAL 90 tablet 3   omeprazole (PRILOSEC) 40 MG capsule TAKE 1 CAPSULE(40 MG) BY MOUTH DAILY 90 capsule 3   ondansetron (ZOFRAN) 4 MG tablet Take 1 tablet (4 mg total) by mouth every 8 (eight) hours as needed for nausea or vomiting. 20 tablet 1   triamterene-hydrochlorothiazide (MAXZIDE-25) 37.5-25 MG tablet TAKE 1 TABLET BY MOUTH EVERY DAY 90 tablet 3   zinc gluconate 50 MG tablet Take 50 mg by mouth.     Dulaglutide (TRULICITY) 1.5 EX/5.2WU SOPN Inject 1.5 mg into the skin once a week. 6 mL 3   olmesartan (BENICAR) 40 MG tablet TAKE 1 TABLET(40 MG) BY MOUTH DAILY 90 tablet 1   No facility-administered medications prior to visit.    ROS: Review of Systems  Constitutional:  Positive for fatigue. Negative for activity change, appetite change, chills and unexpected weight change.  HENT:  Positive for congestion. Negative for mouth sores and sinus pressure.   Eyes:  Negative for visual disturbance.  Respiratory:  Positive for cough. Negative for chest tightness and shortness of breath.   Cardiovascular:  Negative for chest pain.  Gastrointestinal:  Negative for abdominal pain and  nausea.  Genitourinary:  Negative for difficulty urinating, frequency and vaginal pain.  Musculoskeletal:  Negative for back pain and gait problem.  Skin:  Negative for pallor and rash.  Neurological:  Negative for dizziness, tremors, weakness, numbness and headaches.  Psychiatric/Behavioral:  Negative for confusion, sleep disturbance and suicidal ideas.     Objective:  BP 124/60 (BP Location: Left Arm, Patient Position: Sitting, Cuff Size: Normal)   Pulse 61   Temp 98.3 F (36.8 C) (Oral)   Wt 236 lb (107 kg)   LMP 02/15/2020 (Approximate)   SpO2 98%   BMI 36.96 kg/m   BP Readings from Last 3 Encounters:  03/09/22 124/60  12/06/21 130/86  08/23/21 138/88    Wt Readings from Last 3 Encounters:  03/09/22 236 lb (107 kg)  12/06/21 230 lb (104.3 kg)  08/23/21 219 lb (99.3 kg)    Physical Exam Constitutional:      General: She is not in acute distress.    Appearance: She is well-developed. She is obese.  HENT:     Head: Normocephalic.     Right Ear: External ear normal.     Left Ear: External ear normal.     Nose: Congestion present.  Eyes:     General:        Right eye: No discharge.        Left eye: No discharge.     Conjunctiva/sclera: Conjunctivae normal.  Pupils: Pupils are equal, round, and reactive to light.  Neck:     Thyroid: No thyromegaly.     Vascular: No JVD.     Trachea: No tracheal deviation.  Cardiovascular:     Rate and Rhythm: Normal rate and regular rhythm.     Heart sounds: Normal heart sounds.  Pulmonary:     Effort: No respiratory distress.     Breath sounds: No stridor. No wheezing.  Abdominal:     General: Bowel sounds are normal. There is no distension.     Palpations: Abdomen is soft. There is no mass.     Tenderness: There is no abdominal tenderness. There is no guarding or rebound.  Musculoskeletal:        General: No tenderness.     Cervical back: Normal range of motion and neck supple. No rigidity.  Lymphadenopathy:      Cervical: No cervical adenopathy.  Skin:    Findings: No erythema or rash.  Neurological:     Cranial Nerves: No cranial nerve deficit.     Motor: No abnormal muscle tone.     Coordination: Coordination normal.     Deep Tendon Reflexes: Reflexes normal.  Psychiatric:        Behavior: Behavior normal.        Thought Content: Thought content normal.        Judgment: Judgment normal.   Eryth throat    Lab Results  Component Value Date   WBC 11.0 (H) 03/31/2020   HGB 13.1 03/31/2020   HCT 40.0 03/31/2020   PLT 242 03/31/2020   GLUCOSE 77 03/09/2022   CHOL 165 03/20/2019   TRIG 148.0 03/20/2019   HDL 47.90 03/20/2019   LDLCALC 88 03/20/2019   ALT 27 03/09/2022   AST 22 03/09/2022   NA 140 03/09/2022   K 3.9 03/09/2022   CL 104 03/09/2022   CREATININE 1.04 03/09/2022   BUN 22 03/09/2022   CO2 30 03/09/2022   TSH 0.95 09/10/2019   INR 0.9 02/06/2018   HGBA1C 6.6 (H) 03/09/2022    MM DIAG BREAST TOMO BILATERAL  Result Date: 05/20/2021 CLINICAL DATA:  61 year old female presenting for right breast enlargement. The patient states that she has lost weight recently. She also is having non focal central right breast pain. EXAM: DIGITAL DIAGNOSTIC BILATERAL MAMMOGRAM WITH TOMOSYNTHESIS AND CAD TECHNIQUE: Bilateral digital diagnostic mammography and breast tomosynthesis was performed. The images were evaluated with computer-aided detection. COMPARISON:  Previous exam(s). ACR Breast Density Category b: There are scattered areas of fibroglandular density. FINDINGS: No suspicious calcifications, masses or areas of distortion are seen in the bilateral breasts. IMPRESSION: 1. There are no mammographic abnormalities in the right breast to account for the patient's breast size asymmetry or the nonfocal pain. RECOMMENDATION: 1. Clinical follow-up recommended for the right breast pain and a symmetric size. Any further workup should be based on clinical grounds. 2.  screening mammogram in one  year.(Code:SM-B-01Y) I have discussed the findings and recommendations with the patient. If applicable, a reminder letter will be sent to the patient regarding the next appointment. BI-RADS CATEGORY  1: Negative. Electronically Signed   By: Ammie Ferrier M.D.   On: 05/20/2021 11:29    Assessment & Plan:   Problem List Items Addressed This Visit     Acute bronchitis    New Z pac Diflucan prn Prom cough syrup Off work 10/3-10/8      Diabetes mellitus type 2 in obese ([place])     Ozempic -  unable to use Trulicity inj - not effective Mounjaro - will try a PA again       Relevant Medications   tirzepatide [facility]) 2.5 MG/0.5ML Pen   Other Relevant Orders   Comprehensive metabolic panel (Completed)   Hemoglobin A1c (Completed)   Obesity    Wt Readings from Last 3 Encounters:  03/09/22 236 lb (107 kg)  12/06/21 230 lb (104.3 kg)  08/23/21 219 lb (99.3 kg)  Worse Pt needs Mounjaro       Relevant Medications   tirzepatide (MOUNJARO) 2.5 MG/0.5ML Pen      Meds ordered this encounter  Medications   tirzepatide (MOUNJARO) 2.5 MG/0.5ML Pen    Sig: Inject 2.5 mg into the skin once a week.    Dispense:  2 mL    Refill:  3    Ozempic - unable to use; Trulicity inj - not effective   DISCONTD: azithromycin (ZITHROMAX Z-PAK) 250 MG tablet    Sig: As directed    Dispense:  6 tablet    Refill:  0   fluconazole (DIFLUCAN) 150 MG tablet    Sig: Take 1 tablet (150 mg total) by mouth once for 1 dose.    Dispense:  1 tablet    Refill:  1   promethazine-dextromethorphan (PROMETHAZINE-DM) 6.25-15 MG/5ML syrup    Sig: Take 5 mLs by mouth 4 (four) times daily as needed for cough.    Dispense:  240 mL    Refill:  0      Follow-up: Return in about 3 months (around 06/09/2022) for a follow-up visit.  Walker Kehr, MD

## 2022-03-09 NOTE — Assessment & Plan Note (Signed)
Ozempic - unable to use Trulicity inj - not effective Mounjaro - will try a PA again

## 2022-03-09 NOTE — Assessment & Plan Note (Signed)
New Z pac Diflucan prn Prom cough syrup Off work 10/3-10/8

## 2022-03-10 ENCOUNTER — Telehealth: Payer: Self-pay | Admitting: *Deleted

## 2022-03-10 NOTE — Telephone Encounter (Signed)
Pt need PA on Mounjaro. Submitted w/  (Key: [id number]). Rec'd msg back meed was APPROVED. effective 02/08/2022 through 03/10/2023. FAX APPROVAL TO [facility].[name]/LMB

## 2022-03-14 ENCOUNTER — Other Ambulatory Visit: Payer: Self-pay | Admitting: Internal Medicine

## 2022-03-15 ENCOUNTER — Encounter: Payer: Self-pay | Admitting: Internal Medicine

## 2022-03-16 ENCOUNTER — Other Ambulatory Visit: Payer: Self-pay | Admitting: Internal Medicine

## 2022-03-16 MED ORDER — AZITHROMYCIN 250 MG PO TABS: ORAL_TABLET | ORAL | 0 refills | Status: DC

## 2022-03-30 ENCOUNTER — Other Ambulatory Visit: Payer: Self-pay | Admitting: Internal Medicine

## 2022-04-19 ENCOUNTER — Encounter: Payer: Self-pay | Admitting: Internal Medicine

## 2022-04-26 ENCOUNTER — Other Ambulatory Visit: Payer: Self-pay | Admitting: Internal Medicine

## 2022-04-26 MED ORDER — TIRZEPATIDE 5 MG/0.5ML ~~LOC~~ SOAJ: 5.0000 mg | SUBCUTANEOUS | 3 refills | Status: DC

## 2022-05-03 ENCOUNTER — Ambulatory Visit: Payer: Federal, State, Local not specified - PPO | Admitting: Adult Health

## 2022-05-03 ENCOUNTER — Encounter: Payer: Self-pay | Admitting: Adult Health

## 2022-05-03 VITALS — BP 159/98 | HR 67 | Ht 67.5 in | Wt 224.0 lb

## 2022-05-03 DIAGNOSIS — N390 Urinary tract infection, site not specified: Secondary | ICD-10-CM | POA: Diagnosis not present

## 2022-05-03 DIAGNOSIS — R319 Hematuria, unspecified: Secondary | ICD-10-CM | POA: Diagnosis not present

## 2022-05-03 DIAGNOSIS — R350 Frequency of micturition: Secondary | ICD-10-CM

## 2022-05-03 LAB — POCT URINALYSIS DIPSTICK
Glucose, UA: NEGATIVE
Ketones, UA: NEGATIVE
Nitrite, UA: NEGATIVE
Protein, UA: NEGATIVE

## 2022-05-03 MED ORDER — SULFAMETHOXAZOLE-TRIMETHOPRIM 800-160 MG PO TABS: 1.0000 | ORAL_TABLET | Freq: Two times a day (BID) | ORAL | 0 refills | Status: DC

## 2022-05-03 NOTE — Progress Notes (Signed)
  Subjective:     Patient ID: Caroline Wong, female   DOB: 12/05/1960, 61 y.o.   MRN: [id number]  HPI Caroline Wong is a 61 year old white female,married, sp hysterectomy in complaining of urinary frequency, no burning.   PCP is Dr Alain Marion  Review of Systems Has urinary frequency Denies any burning Reviewed past medical,surgical, social and family history. Reviewed medications and allergies.     Objective:   Physical Exam BP (!) 159/98 (BP Location: Left Arm, Patient Position: Sitting, Cuff Size: Normal)   Pulse 67   Ht 5' 7.5" (1.715 m)   Wt 224 lb (101.6 kg)   LMP 02/15/2020 (Approximate)   BMI 34.57 kg/m     Urine dipstick trace blood and large leuks Skin warm and dry.  Lungs: clear to ausculation bilaterally. Cardiovascular: regular rate and rhythm. No CVAT  Declines pelvic exam. Fall risk is low  Upstream - 05/03/22 1423       Pregnancy Intention Screening   Does the patient want to become pregnant in the next year? N/A    Does the patient's partner want to become pregnant in the next year? N/A    Would the patient like to discuss contraceptive options today? N/A      Contraception Wrap Up   Current Method Female Sterilization   hyst   End Method Female Sterilization   hyst   Contraception Counseling Provided No             Assessment:      1. Urinary frequency +Urinary frequency No burning Will get UA C&S  - POCT Urinalysis Dipstick - Urine Culture - Urinalysis, Routine w reflex microscopic  2. Hematuria, unspecified type UA C&S sent   3. Urinary tract infection with hematuria, site unspecified Will rx septra ds Meds ordered this encounter  Medications   sulfamethoxazole-trimethoprim (BACTRIM DS) 800-160 MG tablet    Sig: Take 1 tablet by mouth 2 (two) times daily. Take 1 bid    Dispense:  14 tablet    Refill:  0    Order Specific Question:   Supervising Provider    Answer:   Florian Buff [[id number]]   Push fluids Try to void often and not  hold Note given to RTW 05/05/22    Plan:    Follow up prn

## 2022-05-06 LAB — URINALYSIS, ROUTINE W REFLEX MICROSCOPIC
Bilirubin, UA: NEGATIVE
Glucose, UA: NEGATIVE
Ketones, UA: NEGATIVE
Nitrite, UA: POSITIVE — AB
Protein,UA: NEGATIVE
RBC, UA: NEGATIVE
Specific Gravity, UA: 1.01 (ref 1.005–1.030)
Urobilinogen, Ur: 0.2 mg/dL (ref 0.2–1.0)
pH, UA: 6 (ref 5.0–7.5)

## 2022-05-06 LAB — URINE CULTURE

## 2022-05-06 LAB — MICROSCOPIC EXAMINATION
Casts: NONE SEEN /lpf
Epithelial Cells (non renal): NONE SEEN /hpf (ref 0–10)
WBC, UA: >30 /hpf — AB (ref 0–5)

## 2022-06-08 ENCOUNTER — Encounter: Payer: Self-pay | Admitting: Internal Medicine

## 2022-06-08 ENCOUNTER — Ambulatory Visit: Payer: Federal, State, Local not specified - PPO | Admitting: Internal Medicine

## 2022-06-08 VITALS — BP 128/80 | HR 60 | Temp 98.3°F | Ht 67.5 in | Wt 220.0 lb

## 2022-06-08 DIAGNOSIS — E1169 Type 2 diabetes mellitus with other specified complication: Secondary | ICD-10-CM

## 2022-06-08 DIAGNOSIS — E669 Obesity, unspecified: Secondary | ICD-10-CM

## 2022-06-08 LAB — COMPREHENSIVE METABOLIC PANEL
ALT: 27 U/L (ref 0–35)
AST: 23 U/L (ref 0–37)
Albumin: 4.2 g/dL (ref 3.5–5.2)
Alkaline Phosphatase: 102 U/L (ref 39–117)
BUN: 19 mg/dL (ref 6–23)
CO2: 29 mEq/L (ref 19–32)
Calcium: 9.8 mg/dL (ref 8.4–10.5)
Chloride: 103 mEq/L (ref 96–112)
Creatinine, Ser: 0.97 mg/dL (ref 0.40–1.20)
GFR: 64.12 mL/min (ref >60.00)
Glucose, Bld: 101 mg/dL — ABNORMAL HIGH (ref 70–99)
Potassium: 4.3 mEq/L (ref 3.5–5.1)
Sodium: 140 mEq/L (ref 135–145)
Total Bilirubin: 0.3 mg/dL (ref 0.2–1.2)
Total Protein: 7.4 g/dL (ref 6.0–8.3)

## 2022-06-08 LAB — HEMOGLOBIN A1C: Hgb A1c MFr Bld: 6.1 % (ref 4.6–6.5)

## 2022-06-08 MED ORDER — TIRZEPATIDE 7.5 MG/0.5ML ~~LOC~~ SOAJ: 7.5000 mg | SUBCUTANEOUS | 3 refills | Status: DC

## 2022-06-08 MED ORDER — ONDANSETRON HCL 4 MG PO TABS: 4.0000 mg | ORAL_TABLET | Freq: Three times a day (TID) | ORAL | 1 refills | Status: DC | PRN

## 2022-06-08 MED ORDER — LORAZEPAM 0.5 MG PO TABS: ORAL_TABLET | ORAL | 5 refills | Status: DC

## 2022-06-08 MED ORDER — METOPROLOL SUCCINATE ER 50 MG PO TB24: ORAL_TABLET | ORAL | 3 refills | Status: DC

## 2022-06-08 MED ORDER — TRIAMTERENE-HCTZ 37.5-25 MG PO TABS: 1.0000 | ORAL_TABLET | Freq: Every day | ORAL | 3 refills | Status: DC

## 2022-06-08 MED ORDER — ALBUTEROL SULFATE HFA 108 (90 BASE) MCG/ACT IN AERS: 2.0000 | INHALATION_SPRAY | Freq: Four times a day (QID) | RESPIRATORY_TRACT | 5 refills | Status: DC | PRN

## 2022-06-08 MED ORDER — OMEPRAZOLE 40 MG PO CPDR: DELAYED_RELEASE_CAPSULE | ORAL | 3 refills | Status: DC

## 2022-06-08 MED ORDER — OLMESARTAN MEDOXOMIL 40 MG PO TABS: ORAL_TABLET | ORAL | 3 refills | Status: DC

## 2022-06-08 NOTE — Progress Notes (Signed)
Subjective:  Patient ID: Caroline Wong, female    DOB: 20-Oct-1960  Age: 62 y.o. MRN: [id number]  CC: Follow-up   HPI Caroline Wong presents for DM, HTN, GERD, stress  Outpatient Medications Prior to Visit  Medication Sig Dispense Refill   azithromycin (ZITHROMAX Z-PAK) 250 MG tablet As directed 6 tablet 0   diphenhydrAMINE (BENADRYL) 25 MG tablet Take 2 tablets (50 mg total) by mouth every 4 (four) hours as needed (bee sting). 30 tablet 3   promethazine-dextromethorphan (PROMETHAZINE-DM) 6.25-15 MG/5ML syrup Take 5 mLs by mouth 4 (four) times daily as needed for cough. 240 mL 0   sulfamethoxazole-trimethoprim (BACTRIM DS) 800-160 MG tablet Take 1 tablet by mouth 2 (two) times daily. Take 1 bid 14 tablet 0   zinc gluconate 50 MG tablet Take 50 mg by mouth.     albuterol (VENTOLIN HFA) 108 (90 Base) MCG/ACT inhaler INHALE 2 PUFFS INTO THE LUNGS EVERY 6 HOURS AS NEEDED FOR WHEEZING OR SHORTNESS OF BREATH 18 g 5   LORazepam (ATIVAN) 0.5 MG tablet TAKE 1 TABLET(0.5 MG) BY MOUTH AT BEDTIME AS NEEDED 30 tablet 5   metoprolol succinate (TOPROL-XL) 50 MG 24 hr tablet TAKE 1 TABLET BY MOUTH EVERY DAY WITH OR IMMEDIATELY FOLLOWING A MEAL 90 tablet 3   olmesartan (BENICAR) 40 MG tablet TAKE 1 TABLET(40 MG) BY MOUTH DAILY 90 tablet 1   omeprazole (PRILOSEC) 40 MG capsule TAKE 1 CAPSULE(40 MG) BY MOUTH DAILY 90 capsule 3   ondansetron (ZOFRAN) 4 MG tablet Take 1 tablet (4 mg total) by mouth every 8 (eight) hours as needed for nausea or vomiting. 20 tablet 1   tirzepatide (MOUNJARO) 5 MG/0.5ML Pen Inject 5 mg into the skin once a week. 6 mL 3   triamterene-hydrochlorothiazide (MAXZIDE-25) 37.5-25 MG tablet TAKE 1 TABLET BY MOUTH EVERY DAY 90 tablet 3   No facility-administered medications prior to visit.    ROS: Review of Systems  Constitutional:  Negative for activity change, appetite change, chills, fatigue and unexpected weight change.  HENT:  Negative for congestion, mouth sores and sinus  pressure.   Eyes:  Negative for visual disturbance.  Respiratory:  Negative for cough and chest tightness.   Gastrointestinal:  Negative for abdominal pain and nausea.  Genitourinary:  Negative for difficulty urinating, frequency and vaginal pain.  Musculoskeletal:  Negative for back pain and gait problem.  Skin:  Negative for pallor and rash.  Neurological:  Negative for dizziness, tremors, weakness, numbness and headaches.  Psychiatric/Behavioral:  Negative for confusion and sleep disturbance.     Objective:  BP 128/80 (BP Location: Right Arm, Patient Position: Sitting, Cuff Size: Normal)   Pulse 60   Temp 98.3 F (36.8 C) (Oral)   Ht 5' 7.5" (1.715 m)   Wt 220 lb (99.8 kg)   LMP 02/15/2020 (Approximate)   SpO2 99%   BMI 33.95 kg/m   BP Readings from Last 3 Encounters:  06/08/22 128/80  05/03/22 (!) 159/98  03/09/22 124/60    Wt Readings from Last 3 Encounters:  06/08/22 220 lb (99.8 kg)  05/03/22 224 lb (101.6 kg)  03/09/22 236 lb (107 kg)    Physical Exam Constitutional:      General: She is not in acute distress.    Appearance: She is well-developed. She is obese.  HENT:     Head: Normocephalic.     Right Ear: External ear normal.     Left Ear: External ear normal.     Nose: Nose  normal.  Eyes:     General:        Right eye: No discharge.        Left eye: No discharge.     Conjunctiva/sclera: Conjunctivae normal.     Pupils: Pupils are equal, round, and reactive to light.  Neck:     Thyroid: No thyromegaly.     Vascular: No JVD.     Trachea: No tracheal deviation.  Cardiovascular:     Rate and Rhythm: Normal rate and regular rhythm.     Heart sounds: Normal heart sounds.  Pulmonary:     Effort: No respiratory distress.     Breath sounds: No stridor. No wheezing.  Abdominal:     General: Bowel sounds are normal. There is no distension.     Palpations: Abdomen is soft. There is no mass.     Tenderness: There is no abdominal tenderness. There is no  guarding or rebound.  Musculoskeletal:        General: No tenderness.     Cervical back: Normal range of motion and neck supple. No rigidity.  Lymphadenopathy:     Cervical: No cervical adenopathy.  Skin:    Findings: No erythema or rash.  Neurological:     Cranial Nerves: No cranial nerve deficit.     Motor: No abnormal muscle tone.     Coordination: Coordination normal.     Deep Tendon Reflexes: Reflexes normal.  Psychiatric:        Behavior: Behavior normal.        Thought Content: Thought content normal.        Judgment: Judgment normal.     Lab Results  Component Value Date   WBC 11.0 (H) 03/31/2020   HGB 13.1 03/31/2020   HCT 40.0 03/31/2020   PLT 242 03/31/2020   GLUCOSE 77 03/09/2022   CHOL 165 03/20/2019   TRIG 148.0 03/20/2019   HDL 47.90 03/20/2019   LDLCALC 88 03/20/2019   ALT 27 03/09/2022   AST 22 03/09/2022   NA 140 03/09/2022   K 3.9 03/09/2022   CL 104 03/09/2022   CREATININE 1.04 03/09/2022   BUN 22 03/09/2022   CO2 30 03/09/2022   TSH 0.95 09/10/2019   INR 0.9 02/06/2018   HGBA1C 6.6 (H) 03/09/2022    MM DIAG BREAST TOMO BILATERAL  Result Date: 05/20/2021 CLINICAL DATA:  62 year old female presenting for right breast enlargement. The patient states that she has lost weight recently. She also is having non focal central right breast pain. EXAM: DIGITAL DIAGNOSTIC BILATERAL MAMMOGRAM WITH TOMOSYNTHESIS AND CAD TECHNIQUE: Bilateral digital diagnostic mammography and breast tomosynthesis was performed. The images were evaluated with computer-aided detection. COMPARISON:  Previous exam(s). ACR Breast Density Category b: There are scattered areas of fibroglandular density. FINDINGS: No suspicious calcifications, masses or areas of distortion are seen in the bilateral breasts. IMPRESSION: 1. There are no mammographic abnormalities in the right breast to account for the patient's breast size asymmetry or the nonfocal pain. RECOMMENDATION: 1. Clinical  follow-up recommended for the right breast pain and a symmetric size. Any further workup should be based on clinical grounds. 2.  screening mammogram in one year.(Code:SM-B-01Y) I have discussed the findings and recommendations with the patient. If applicable, a reminder letter will be sent to the patient regarding the next appointment. BI-RADS CATEGORY  1: Negative. Electronically Signed   By: Ammie Ferrier M.D.   On: 05/20/2021 11:29    Assessment & Plan:   Problem List Items Addressed This  Visit   None     Meds ordered this encounter  Medications   albuterol (VENTOLIN HFA) 108 (90 Base) MCG/ACT inhaler    Sig: Inhale 2 puffs into the lungs every 6 (six) hours as needed for wheezing or shortness of breath.    Dispense:  18 g    Refill:  5   LORazepam (ATIVAN) 0.5 MG tablet    Sig: TAKE 1 TABLET(0.5 MG) BY MOUTH AT BEDTIME AS NEEDED    Dispense:  30 tablet    Refill:  5   metoprolol succinate (TOPROL-XL) 50 MG 24 hr tablet    Sig: TAKE 1 TABLET BY MOUTH EVERY DAY WITH OR IMMEDIATELY FOLLOWING A MEAL    Dispense:  90 tablet    Refill:  3   olmesartan (BENICAR) 40 MG tablet    Sig: TAKE 1 TABLET(40 MG) BY MOUTH DAILY    Dispense:  90 tablet    Refill:  3   omeprazole (PRILOSEC) 40 MG capsule    Sig: TAKE 1 CAPSULE(40 MG) BY MOUTH DAILY    Dispense:  90 capsule    Refill:  3   ondansetron (ZOFRAN) 4 MG tablet    Sig: Take 1 tablet (4 mg total) by mouth every 8 (eight) hours as needed for nausea or vomiting.    Dispense:  20 tablet    Refill:  1   triamterene-hydrochlorothiazide (MAXZIDE-25) 37.5-25 MG tablet    Sig: Take 1 tablet by mouth daily.    Dispense:  90 tablet    Refill:  3   tirzepatide (MOUNJARO) 7.5 MG/0.5ML Pen    Sig: Inject 7.5 mg into the skin once a week.    Dispense:  6 mL    Refill:  3      Follow-up: Return in about 3 months (around 09/07/2022) for a follow-up visit.  Walker Kehr, MD

## 2022-08-18 NOTE — Progress Notes (Signed)
Orders only

## 2022-09-05 ENCOUNTER — Ambulatory Visit: Payer: Federal, State, Local not specified - PPO | Admitting: Internal Medicine

## 2022-09-14 ENCOUNTER — Encounter: Payer: Self-pay | Admitting: Family

## 2022-09-14 ENCOUNTER — Ambulatory Visit: Payer: Federal, State, Local not specified - PPO | Admitting: Family

## 2022-09-14 VITALS — BP 149/94 | HR 56 | Temp 97.7°F | Ht 67.5 in | Wt 203.0 lb

## 2022-09-14 DIAGNOSIS — J9801 Acute bronchospasm: Secondary | ICD-10-CM | POA: Diagnosis not present

## 2022-09-14 DIAGNOSIS — J301 Allergic rhinitis due to pollen: Secondary | ICD-10-CM

## 2022-09-14 MED ORDER — TRIAMCINOLONE ACETONIDE 55 MCG/ACT NA AERO: 1.0000 | INHALATION_SPRAY | Freq: Every day | NASAL | 2 refills | Status: DC

## 2022-09-14 NOTE — Progress Notes (Signed)
Patient ID: Caroline Wong, female    DOB: 12/27/1960, 62 y.o.   MRN: [id number]  Chief Complaint  Patient presents with   Cough    Pt c/o dry cough and hoarseness for a couple of days. Has tried delsym which does ease it.     HPI:      Persistent cough:  pt reports having a dry cough that started 2 days ago along with hoarseness, with coughing and hoarseness, denies hx of COPD or Asthma, but reports having bronchitis in past. Pt denies any fever, states sinus sx are starting today, has had seasonal allergy sx. Pt reports only taking Mucinex for cough, no allergy meds, has Albuterol inhaler but has not used this.  Assessment & Plan:  1. Acute bronchospasm - mild exp wheezing; advised pt to use Albuterol inhaler she has at home, instructed pt on use, 2 puffs at least 3 times per day while having symptoms, then can reduce to prn. Continue Mucinex qd or bid. Sending Nasacort nasal spray, advised on use qd to control overall sinus, allergy sx.  2. Seasonal allergic rhinitis due to pollen - sending Nasacort, advised on use & SE, also recommend using saline nasal spray tid & prior to Nasacort. Drink plenty of water.  - triamcinolone (NASACORT) 55 MCG/ACT AERO nasal inhaler; Place 1 spray into the nose daily. Start with 1 spray each side twice a day for 3 days, then reduce to daily.  Dispense: 1 each; Refill: 2   Subjective:    Outpatient Medications Prior to Visit  Medication Sig Dispense Refill   albuterol (VENTOLIN HFA) 108 (90 Base) MCG/ACT inhaler Inhale 2 puffs into the lungs every 6 (six) hours as needed for wheezing or shortness of breath. 18 g 5   diphenhydrAMINE (BENADRYL) 25 MG tablet Take 2 tablets (50 mg total) by mouth every 4 (four) hours as needed (bee sting). 30 tablet 3   LORazepam (ATIVAN) 0.5 MG tablet TAKE 1 TABLET(0.5 MG) BY MOUTH AT BEDTIME AS NEEDED 30 tablet 5   metoprolol succinate (TOPROL-XL) 50 MG 24 hr tablet TAKE 1 TABLET BY MOUTH EVERY DAY WITH OR IMMEDIATELY  FOLLOWING A MEAL 90 tablet 3   olmesartan (BENICAR) 40 MG tablet TAKE 1 TABLET(40 MG) BY MOUTH DAILY 90 tablet 3   omeprazole (PRILOSEC) 40 MG capsule TAKE 1 CAPSULE(40 MG) BY MOUTH DAILY 90 capsule 3   ondansetron (ZOFRAN) 4 MG tablet Take 1 tablet (4 mg total) by mouth every 8 (eight) hours as needed for nausea or vomiting. 20 tablet 1   tirzepatide (MOUNJARO) 7.5 MG/0.5ML Pen Inject 7.5 mg into the skin once a week. 6 mL 3   triamterene-hydrochlorothiazide (MAXZIDE-25) 37.5-25 MG tablet Take 1 tablet by mouth daily. 90 tablet 3   zinc gluconate 50 MG tablet Take 50 mg by mouth.     azithromycin (ZITHROMAX Z-PAK) 250 MG tablet As directed 6 tablet 0   promethazine-dextromethorphan (PROMETHAZINE-DM) 6.25-15 MG/5ML syrup Take 5 mLs by mouth 4 (four) times daily as needed for cough. 240 mL 0   sulfamethoxazole-trimethoprim (BACTRIM DS) 800-160 MG tablet Take 1 tablet by mouth 2 (two) times daily. Take 1 bid 14 tablet 0   No facility-administered medications prior to visit.   Past Medical History:  Diagnosis Date   Abdominal pain 11/25/2013   Has some nausea associated with pain that has been on and off x 2 weeks will get US   Anxiety    Takes Lorazepam   Cancer  skin Ca. removed from chest   Chronic bronchitis    Diabetes mellitus without complication    Elevated serum creatinine    Endometrial polyp    Gallstones    GERD (gastroesophageal reflux disease)    Hormone replacement therapy (HRT)    Hypertension    Night sweats 04/14/2014   Obesity 05/07/2014   STD (sexually transmitted disease)    HSV ?carrier--never had an outbreak but exposed from ex-husband   Urge incontinence 08/19/2015   Vertigo    Yeast infection 05/07/2014   Past Surgical History:  Procedure Laterality Date   ABDOMINAL HYSTERECTOMY     complete   ANTERIOR CERVICAL DECOMP/DISCECTOMY FUSION N/A 06/05/2015   Procedure: C5-6, C6-7 Anterior Cervical Discectomy and Fusion, Allograft, Plate;  Surgeon: Eldred Manges, MD;  Location: [facility];  Service: Orthopedics;  Laterality: N/A;   ANTERIOR FUSION CERVICAL SPINE  06/05/2015   C 5 6 7     CHOLECYSTECTOMY     COLONOSCOPY  2015   NO POLYPS   CYSTOSCOPY N/A 03/31/2020   Procedure: CYSTOSCOPY;  Surgeon: Patton Salles, MD;  Location: [facility];  Service: Gynecology;  Laterality: N/A;   FOOT SURGERY Left    GANGLION CYST EXCISION Right 07/09/2018   Procedure: EXCISION, RIGHT VOLAR WRIST GANGLION;  Surgeon: Eldred Manges, MD;  Location: [facility];  Service: Orthopedics;  Laterality: Right;   MOLE REMOVAL     TOTAL LAPAROSCOPIC HYSTERECTOMY WITH BILATERAL SALPINGO OOPHORECTOMY N/A 03/31/2020   Procedure: TOTAL LAPAROSCOPIC HYSTERECTOMY WITH BILATERAL SALPINGECTOMY POSSIBLE BILATERAL OOPHORECTOMY;  Surgeon: Patton Salles, MD;  Location: [facility];  Service: Gynecology;  Laterality: N/A;  PLEASE have [employer] as prep solution.  Patient is allergic to Betadine, CHG, and Duraprep.   WISDOM TOOTH EXTRACTION     Allergies  Allergen Reactions   Bee Venom Swelling   Biaxin [Clarithromycin] Swelling    Edema in joint Can take a Zpac ok   Amlodipine Swelling    redness   Betadine [Povidone Iodine] Rash   Chloraprep One Step [Chlorhexidine Gluconate] Rash   Duraprep [Antiseptic Products, Misc.] Rash   Keflex [Cephalexin] Rash      Objective:    Physical Exam Vitals and nursing note reviewed.  Constitutional:      Appearance: Normal appearance.  HENT:     Right Ear: Tympanic membrane and ear canal normal.     Left Ear: Tympanic membrane and ear canal normal.     Nose:     Right Turbinates: Not swollen.     Left Turbinates: Not swollen.     Right Sinus: No frontal sinus tenderness.     Left Sinus: No frontal sinus tenderness.     Mouth/Throat:     Mouth: Mucous membranes are moist.     Pharynx: No pharyngeal swelling, oropharyngeal exudate, posterior oropharyngeal erythema or  uvula swelling.     Tonsils: No tonsillar exudate or tonsillar abscesses.  Cardiovascular:     Rate and Rhythm: Normal rate and regular rhythm.  Pulmonary:     Effort: Pulmonary effort is normal. No tachypnea or respiratory distress.     Breath sounds: Examination of the right-upper field reveals wheezing. Examination of the left-upper field reveals wheezing. Wheezing (mild, expiratory, anterior) present.  Musculoskeletal:        General: Normal range of motion.  Lymphadenopathy:     Head:     Right side of head: No preauricular or posterior auricular  adenopathy.     Left side of head: No preauricular or posterior auricular adenopathy.     Cervical: No cervical adenopathy.     Upper Body:     Right upper body: No supraclavicular adenopathy.     Left upper body: No supraclavicular adenopathy.  Skin:    General: Skin is warm and dry.  Neurological:     Mental Status: She is alert.  Psychiatric:        Mood and Affect: Mood normal.        Behavior: Behavior normal.    BP (!) 149/94 (BP Location: Left Arm, Patient Position: Sitting, Cuff Size: Large)   Pulse (!) 56   Temp 97.7 F (36.5 C) (Temporal)   Ht 5' 7.5" (1.715 m)   Wt 203 lb (92.1 kg)   LMP 02/15/2020 (Approximate)   SpO2 96%   BMI 31.33 kg/m  Wt Readings from Last 3 Encounters:  09/14/22 203 lb (92.1 kg)  06/08/22 220 lb (99.8 kg)  05/03/22 224 lb (101.6 kg)       Dulce Sellar, NP

## 2022-11-03 ENCOUNTER — Other Ambulatory Visit: Payer: Self-pay | Admitting: Adult Health

## 2022-11-03 DIAGNOSIS — Z1231 Encounter for screening mammogram for malignant neoplasm of breast: Secondary | ICD-10-CM

## 2022-11-17 DIAGNOSIS — K080 Exfoliation of teeth due to systemic causes: Secondary | ICD-10-CM | POA: Diagnosis not present

## 2022-11-21 ENCOUNTER — Ambulatory Visit: Payer: Federal, State, Local not specified - PPO | Admitting: Adult Health

## 2022-11-21 ENCOUNTER — Encounter: Payer: Self-pay | Admitting: Internal Medicine

## 2022-11-21 ENCOUNTER — Ambulatory Visit: Payer: Federal, State, Local not specified - PPO | Admitting: Internal Medicine

## 2022-11-21 VITALS — BP 110/70 | HR 67 | Temp 98.3°F | Ht 67.5 in

## 2022-11-21 DIAGNOSIS — E1169 Type 2 diabetes mellitus with other specified complication: Secondary | ICD-10-CM

## 2022-11-21 DIAGNOSIS — Z6835 Body mass index (BMI) 35.0-35.9, adult: Secondary | ICD-10-CM

## 2022-11-21 DIAGNOSIS — E669 Obesity, unspecified: Secondary | ICD-10-CM

## 2022-11-21 DIAGNOSIS — R739 Hyperglycemia, unspecified: Secondary | ICD-10-CM | POA: Diagnosis not present

## 2022-11-21 MED ORDER — AMOXICILLIN-POT CLAVULANATE 875-125 MG PO TABS: 1.0000 | ORAL_TABLET | Freq: Two times a day (BID) | ORAL | 0 refills | Status: DC

## 2022-11-21 MED ORDER — METHYLPREDNISOLONE 4 MG PO TBPK: ORAL_TABLET | ORAL | 0 refills | Status: DC

## 2022-11-21 NOTE — Assessment & Plan Note (Signed)
On Mounjaro 5 mg/wk now - 7.5 mg is n/a 

## 2022-11-21 NOTE — Assessment & Plan Note (Signed)
On Mounjaro 5 mg/wk now - 7.5 mg is n/a

## 2022-11-21 NOTE — Progress Notes (Signed)
Subjective:  Patient ID: SHANEIA GAYMON, female    DOB: 1960/11/07  Age: 62 y.o. MRN: [id number]  CC: Follow-up (6 mnth f/u)   HPI MIKALYA BRUZZESE presents for DM, HTN, GERD On Mounjaro 5 mg/wk now - 7.5 mg is n/a Sinusitis  Outpatient Medications Prior to Visit  Medication Sig Dispense Refill   albuterol (VENTOLIN HFA) 108 (90 Base) MCG/ACT inhaler Inhale 2 puffs into the lungs every 6 (six) hours as needed for wheezing or shortness of breath. 18 g 5   diphenhydrAMINE (BENADRYL) 25 MG tablet Take 2 tablets (50 mg total) by mouth every 4 (four) hours as needed (bee sting). 30 tablet 3   LORazepam (ATIVAN) 0.5 MG tablet TAKE 1 TABLET(0.5 MG) BY MOUTH AT BEDTIME AS NEEDED 30 tablet 5   metoprolol succinate (TOPROL-XL) 50 MG 24 hr tablet TAKE 1 TABLET BY MOUTH EVERY DAY WITH OR IMMEDIATELY FOLLOWING A MEAL 90 tablet 3   olmesartan (BENICAR) 40 MG tablet TAKE 1 TABLET(40 MG) BY MOUTH DAILY 90 tablet 3   omeprazole (PRILOSEC) 40 MG capsule TAKE 1 CAPSULE(40 MG) BY MOUTH DAILY 90 capsule 3   ondansetron (ZOFRAN) 4 MG tablet Take 1 tablet (4 mg total) by mouth every 8 (eight) hours as needed for nausea or vomiting. 20 tablet 1   tirzepatide (MOUNJARO) 7.5 MG/0.5ML Pen Inject 7.5 mg into the skin once a week. 6 mL 3   triamcinolone (NASACORT) 55 MCG/ACT AERO nasal inhaler Place 1 spray into the nose daily. Start with 1 spray each side twice a day for 3 days, then reduce to daily. 1 each 2   triamterene-hydrochlorothiazide (MAXZIDE-25) 37.5-25 MG tablet Take 1 tablet by mouth daily. 90 tablet 3   zinc gluconate 50 MG tablet Take 50 mg by mouth.     No facility-administered medications prior to visit.    ROS: Review of Systems  Constitutional:  Negative for activity change, appetite change, chills, fatigue and unexpected weight change.  HENT:  Negative for congestion, mouth sores and sinus pressure.   Eyes:  Negative for visual disturbance.  Respiratory:  Negative for cough and chest  tightness.   Gastrointestinal:  Negative for abdominal pain and nausea.  Genitourinary:  Negative for difficulty urinating, frequency and vaginal pain.  Musculoskeletal:  Negative for back pain and gait problem.  Skin:  Negative for pallor and rash.  Neurological:  Negative for dizziness, tremors, weakness, numbness and headaches.  Psychiatric/Behavioral:  Negative for confusion and sleep disturbance.     Objective:  BP 110/70 (BP Location: Left Arm, Patient Position: Sitting, Cuff Size: Large)   Pulse 67   Temp 98.3 F (36.8 C) (Oral)   Ht 5' 7.5" (1.715 m)   LMP 02/15/2020 (Approximate)   SpO2 97%   BMI 31.33 kg/m   BP Readings from Last 3 Encounters:  11/21/22 110/70  09/14/22 (!) 149/94  06/08/22 128/80    Wt Readings from Last 3 Encounters:  09/14/22 203 lb (92.1 kg)  06/08/22 220 lb (99.8 kg)  05/03/22 224 lb (101.6 kg)    Physical Exam Constitutional:      General: She is not in acute distress.    Appearance: She is well-developed.  HENT:     Head: Normocephalic.     Right Ear: External ear normal.     Left Ear: External ear normal.     Nose: Nose normal.  Eyes:     General:        Right eye: No discharge.  Left eye: No discharge.     Conjunctiva/sclera: Conjunctivae normal.     Pupils: Pupils are equal, round, and reactive to light.  Neck:     Thyroid: No thyromegaly.     Vascular: No JVD.     Trachea: No tracheal deviation.  Cardiovascular:     Rate and Rhythm: Normal rate and regular rhythm.     Heart sounds: Normal heart sounds.  Pulmonary:     Effort: No respiratory distress.     Breath sounds: No stridor. No wheezing.  Abdominal:     General: Bowel sounds are normal. There is no distension.     Palpations: Abdomen is soft. There is no mass.     Tenderness: There is no abdominal tenderness. There is no guarding or rebound.  Musculoskeletal:        General: Tenderness present.     Cervical back: Normal range of motion and neck supple. No  rigidity.  Lymphadenopathy:     Cervical: No cervical adenopathy.  Skin:    Findings: No erythema or rash.  Neurological:     Mental Status: She is oriented to person, place, and time.     Cranial Nerves: No cranial nerve deficit.     Motor: No abnormal muscle tone.     Coordination: Coordination normal.     Deep Tendon Reflexes: Reflexes normal.  Psychiatric:        Behavior: Behavior normal.        Thought Content: Thought content normal.        Judgment: Judgment normal.     Lab Results  Component Value Date   WBC 11.0 (H) 03/31/2020   HGB 13.1 03/31/2020   HCT 40.0 03/31/2020   PLT 242 03/31/2020   GLUCOSE 101 (H) 06/08/2022   CHOL 165 03/20/2019   TRIG 148.0 03/20/2019   HDL 47.90 03/20/2019   LDLCALC 88 03/20/2019   ALT 27 06/08/2022   AST 23 06/08/2022   NA 140 06/08/2022   K 4.3 06/08/2022   CL 103 06/08/2022   CREATININE 0.97 06/08/2022   BUN 19 06/08/2022   CO2 29 06/08/2022   TSH 0.95 09/10/2019   INR 0.9 02/06/2018   HGBA1C 6.1 06/08/2022    MM DIAG BREAST TOMO BILATERAL  Result Date: 05/20/2021 CLINICAL DATA:  62 year old female presenting for right breast enlargement. The patient states that she has lost weight recently. She also is having non focal central right breast pain. EXAM: DIGITAL DIAGNOSTIC BILATERAL MAMMOGRAM WITH TOMOSYNTHESIS AND CAD TECHNIQUE: Bilateral digital diagnostic mammography and breast tomosynthesis was performed. The images were evaluated with computer-aided detection. COMPARISON:  Previous exam(s). ACR Breast Density Category b: There are scattered areas of fibroglandular density. FINDINGS: No suspicious calcifications, masses or areas of distortion are seen in the bilateral breasts. IMPRESSION: 1. There are no mammographic abnormalities in the right breast to account for the patient's breast size asymmetry or the nonfocal pain. RECOMMENDATION: 1. Clinical follow-up recommended for the right breast pain and a symmetric size. Any  further workup should be based on clinical grounds. 2.  screening mammogram in one year.(Code:SM-B-01Y) I have discussed the findings and recommendations with the patient. If applicable, a reminder letter will be sent to the patient regarding the next appointment. BI-RADS CATEGORY  1: Negative. Electronically Signed   By: Frederico Hamman M.D.   On: 05/20/2021 11:29    Assessment & Plan:   Problem List Items Addressed This Visit     Body mass index (BMI) 35.0-35.9, adult  Better      Hyperglycemia - Primary    On Mounjaro 5 mg/wk now - 7.5 mg is n/a      Relevant Orders   Urine microalbumin-creatinine with uACR   Hemoglobin A1c   Type 2 diabetes mellitus with obesity (HCC)    On Mounjaro 5 mg/wk now - 7.5 mg is n/a         Meds ordered this encounter  Medications   methylPREDNISolone (MEDROL DOSEPAK) 4 MG TBPK tablet    Sig: As directed    Dispense:  21 tablet    Refill:  0   amoxicillin-clavulanate (AUGMENTIN) 875-125 MG tablet    Sig: Take 1 tablet by mouth 2 (two) times daily.    Dispense:  20 tablet    Refill:  0      Follow-up: No follow-ups on file.  Sonda Primes, MD

## 2022-11-21 NOTE — Assessment & Plan Note (Signed)
Better  

## 2022-11-24 ENCOUNTER — Ambulatory Visit: Payer: Federal, State, Local not specified - PPO

## 2022-11-29 ENCOUNTER — Ambulatory Visit
Admission: RE | Admit: 2022-11-29 | Discharge: 2022-11-29 | Disposition: A | Discharge status: Home / Self Care | Payer: Federal, State, Local not specified - PPO | Source: Ambulatory Visit | Attending: Adult Health | Admitting: Adult Health

## 2022-11-29 DIAGNOSIS — Z1231 Encounter for screening mammogram for malignant neoplasm of breast: Secondary | ICD-10-CM

## 2022-12-05 DIAGNOSIS — L814 Other melanin hyperpigmentation: Secondary | ICD-10-CM | POA: Diagnosis not present

## 2022-12-05 DIAGNOSIS — D225 Melanocytic nevi of trunk: Secondary | ICD-10-CM | POA: Diagnosis not present

## 2022-12-05 DIAGNOSIS — L821 Other seborrheic keratosis: Secondary | ICD-10-CM | POA: Diagnosis not present

## 2022-12-05 DIAGNOSIS — Z7189 Other specified counseling: Secondary | ICD-10-CM | POA: Diagnosis not present

## 2022-12-09 ENCOUNTER — Other Ambulatory Visit: Payer: Self-pay | Admitting: Internal Medicine

## 2022-12-19 ENCOUNTER — Encounter: Payer: Self-pay | Admitting: Internal Medicine

## 2022-12-19 NOTE — Telephone Encounter (Signed)
Not on med list pls advise.Marland KitchenJohny Wong

## 2022-12-21 ENCOUNTER — Ambulatory Visit (INDEPENDENT_AMBULATORY_CARE_PROVIDER_SITE_OTHER): Payer: Federal, State, Local not specified - PPO | Admitting: Adult Health

## 2022-12-21 ENCOUNTER — Encounter: Payer: Self-pay | Admitting: Adult Health

## 2022-12-21 VITALS — BP 143/99 | HR 66 | Ht 69.0 in | Wt 190.0 lb

## 2022-12-21 DIAGNOSIS — Z90721 Acquired absence of ovaries, unilateral: Secondary | ICD-10-CM

## 2022-12-21 DIAGNOSIS — Z01419 Encounter for gynecological examination (general) (routine) without abnormal findings: Secondary | ICD-10-CM

## 2022-12-21 DIAGNOSIS — N644 Mastodynia: Secondary | ICD-10-CM

## 2022-12-21 DIAGNOSIS — Z90710 Acquired absence of both cervix and uterus: Secondary | ICD-10-CM | POA: Diagnosis not present

## 2022-12-21 DIAGNOSIS — Z1211 Encounter for screening for malignant neoplasm of colon: Secondary | ICD-10-CM | POA: Diagnosis not present

## 2022-12-21 DIAGNOSIS — I10 Essential (primary) hypertension: Secondary | ICD-10-CM

## 2022-12-21 LAB — HEMOCCULT GUIAC POC 1CARD (OFFICE): Fecal Occult Blood, POC: NEGATIVE

## 2022-12-21 NOTE — Progress Notes (Signed)
Patient ID: Caroline Wong, female   DOB: 1961-03-02, 62 y.o.   MRN: [id number] History of Present Illness: Caroline Wong is a 62 year old white female, married, sp hysterectomy, in for a well woman gyn exam. She is still working at [employer].  PCP is Dr Posey Rea   Current Medications, Allergies, Past Medical History, Past Surgical History, Family History and Social History were reviewed in [employer] record.     Review of Systems: Patient denies any headaches, hearing loss, fatigue, blurred vision, shortness of breath, chest pain, abdominal pain, problems with bowel movements, urination, or intercourse. No joint pain or mood swings.  Does have breast tenderness esp at nipple, esp with manipulation    Physical Exam:BP (!) 143/99 (BP Location: Left Arm, Patient Position: Sitting, Cuff Size: Normal)   Pulse 66   Ht 5\' 9"  (1.753 m)   Wt 190 lb (86.2 kg)   LMP 02/15/2020 (Approximate)   BMI 28.06 kg/m   has lost about 45 lbs, on mounjaro now  General:  Well developed, well nourished, no acute distress Skin:  Warm and dry Neck:  Midline trachea, normal thyroid, good ROM, no lymphadenopathy,no carotid bruits heard Lungs; Clear to auscultation bilaterally Breast:  No dominant palpable mass, retraction, or nipple discharge Cardiovascular: Regular rate and rhythm Abdomen:  Soft, non tender, no hepatosplenomegaly Pelvic:  External genitalia is normal in appearance, no lesions.  The vagina is pale. Urethra has no lesions or masses. The cervix and uterus are absent.  No adnexal masses or tenderness noted.Bladder is non tender, no masses felt. Rectal: Good sphincter tone, no polyps, or hemorrhoids felt.  Hemoccult negative. Extremities/musculoskeletal:  No swelling or varicosities noted, no clubbing or cyanosis Psych:  No mood changes, alert and cooperative,seems happy Fall risk is low Refused screening  Upstream - 12/21/22 0837       Pregnancy Intention Screening   Does  the patient want to become pregnant in the next year? N/A    Does the patient's partner want to become pregnant in the next year? N/A    Would the patient like to discuss contraceptive options today? N/A      Contraception Wrap Up   Current Method Female Sterilization   hyst   End Method Female Sterilization   hyst   Contraception Counseling Provided No            Examination chaperoned by Malachy Mood LPN   Impression and Plan: 1. Encounter for well woman exam with routine gynecological exam Physical in 1 year Labs with PCP Mammogram was normal 11/29/22  Colonoscopy per GI  Stay active  2. Breast tenderness in female  3. S/P hysterectomy with oophorectomy  4. Encounter for screening fecal occult blood testing Hemoccult was negative  - POCT occult blood stool  5. Hypertension, unspecified type Take BP meds  Check BP at home and Follow up with PCP

## 2022-12-23 ENCOUNTER — Other Ambulatory Visit: Payer: Self-pay | Admitting: Internal Medicine

## 2022-12-23 MED ORDER — EPINEPHRINE 0.3 MG/0.3ML IJ SOAJ: 0.3000 mg | INTRAMUSCULAR | 2 refills | Status: AC | PRN

## 2023-03-11 ENCOUNTER — Encounter: Payer: Self-pay | Admitting: Internal Medicine

## 2023-03-15 ENCOUNTER — Other Ambulatory Visit (HOSPITAL_COMMUNITY): Payer: Self-pay

## 2023-03-15 ENCOUNTER — Telehealth: Payer: Self-pay

## 2023-03-15 NOTE — Telephone Encounter (Signed)
Pharmacy Patient Advocate Encounter   Received notification from CoverMyMeds that prior authorization for [facility] 7.5MG /0.5ML auto-injectors is required/requested.   Insurance verification completed.   The patient is insured through [facility] .   Per test claim: APPROVED from 03/15/23 to 03/14/24. Ran test claim, Copay is $53.74. This test claim was processed through [facility]- copay amounts may vary at other pharmacies due to pharmacy/plan contracts, or as the patient moves through the different stages of their insurance plan.   Key: [id number] PA Case ID #: [id number]

## 2023-03-17 ENCOUNTER — Other Ambulatory Visit (HOSPITAL_COMMUNITY): Payer: Self-pay

## 2023-04-16 ENCOUNTER — Encounter: Payer: Self-pay | Admitting: Internal Medicine

## 2023-04-18 ENCOUNTER — Other Ambulatory Visit: Payer: Self-pay | Admitting: Internal Medicine

## 2023-04-18 MED ORDER — TIRZEPATIDE 10 MG/0.5ML ~~LOC~~ SOAJ: 10.0000 mg | SUBCUTANEOUS | 3 refills | Status: DC

## 2023-05-09 ENCOUNTER — Other Ambulatory Visit: Payer: Self-pay | Admitting: Internal Medicine

## 2023-05-22 ENCOUNTER — Ambulatory Visit: Payer: Federal, State, Local not specified - PPO | Admitting: Internal Medicine

## 2023-06-05 ENCOUNTER — Ambulatory Visit: Payer: Federal, State, Local not specified - PPO | Admitting: Family Medicine

## 2023-06-05 ENCOUNTER — Encounter: Payer: Self-pay | Admitting: Family Medicine

## 2023-06-05 VITALS — BP 118/86 | HR 64 | Temp 97.9°F | Ht 69.0 in | Wt 172.0 lb

## 2023-06-05 DIAGNOSIS — H66002 Acute suppurative otitis media without spontaneous rupture of ear drum, left ear: Secondary | ICD-10-CM | POA: Diagnosis not present

## 2023-06-05 MED ORDER — AZITHROMYCIN 250 MG PO TABS
ORAL_TABLET | ORAL | 0 refills | Status: DC
Start: 2023-06-05 — End: 2023-07-10

## 2023-06-05 NOTE — Progress Notes (Signed)
   Acute Office Visit  Subjective:     Patient ID: Caroline Wong, female    DOB: 01/13/1961, 62 y.o.   MRN: [id number]  Chief Complaint  Patient presents with   URI    Cough, sinus/nasal congestion, sinus symptoms since last Friday. Notes feeling lightheaded "spacey". Currently treating with mucinex    URI    Patient is in today for evaluation of sinus pain, dry cough, congestion, for the last week.  Reports feeling off balance as well.  She is concerned that she has some fluid in her inner ear. Has tried mucinex with little relief.  Denies known sick contacts. Denies abdominal pain, nausea, vomiting, diarrhea, rash, fever, chills, other symptoms.  Medical hx as outlined below.  ROS Per HPI      Objective:    BP 118/86   Pulse 64   Temp 97.9 F (36.6 C)   Ht 5\' 9"  (1.753 m)   Wt 172 lb (78 kg)   LMP 02/15/2020 (Approximate)   SpO2 97%   BMI 25.40 kg/m    Physical Exam Vitals and nursing note reviewed.  Constitutional:      General: She is not in acute distress.    Comments: Appears fatigued  HENT:     Head: Normocephalic and atraumatic.     Right Ear: Ear canal and external ear normal. No middle ear effusion.     Left Ear: Ear canal and external ear normal. A middle ear effusion is present. Tympanic membrane is erythematous and bulging.     Nose: No congestion.     Right Sinus: Frontal sinus tenderness present.     Left Sinus: Frontal sinus tenderness present.     Mouth/Throat:     Mouth: Mucous membranes are moist.     Pharynx: Oropharynx is clear. No oropharyngeal exudate or posterior oropharyngeal erythema.     Comments: Oropharyngeal cobblestoning   Eyes:     Extraocular Movements: Extraocular movements intact.  Cardiovascular:     Rate and Rhythm: Normal rate and regular rhythm.     Heart sounds: Normal heart sounds.  Pulmonary:     Effort: Pulmonary effort is normal. No respiratory distress.     Breath sounds: No wheezing.     Comments: Dry  cough  Musculoskeletal:     Cervical back: Normal range of motion and neck supple.  Lymphadenopathy:     Cervical: No cervical adenopathy.  Skin:    General: Skin is warm and dry.  Neurological:     Mental Status: She is alert.     No results found for any visits on 06/05/23.      Assessment & Plan:  1. Non-recurrent acute suppurative otitis media of left ear without spontaneous rupture of tympanic membrane (Primary)  - azithromycin (ZITHROMAX Z-PAK) 250 MG tablet; Take 2 tablets (500 mg) PO today, then 1 tablet (250 mg) PO daily x4 days.  Dispense: 6 tablet; Refill: 0 - May continue mucinex as needed  Meds ordered this encounter  Medications   azithromycin (ZITHROMAX Z-PAK) 250 MG tablet    Sig: Take 2 tablets (500 mg) PO today, then 1 tablet (250 mg) PO daily x4 days.    Dispense:  6 tablet    Refill:  0    Return if symptoms worsen or fail to improve.  Moshe Cipro, FNP

## 2023-06-05 NOTE — Patient Instructions (Signed)
 I have sent in azithromycin for you to take.  Take 2 tablets today, then 1 tablet daily for the next 4 days.   Follow-up with me for new or worsening symptoms.

## 2023-06-09 ENCOUNTER — Other Ambulatory Visit: Payer: Self-pay | Admitting: Internal Medicine

## 2023-06-12 ENCOUNTER — Ambulatory Visit: Payer: Federal, State, Local not specified - PPO | Admitting: Internal Medicine

## 2023-07-10 ENCOUNTER — Ambulatory Visit: Payer: Federal, State, Local not specified - PPO | Admitting: Internal Medicine

## 2023-07-10 ENCOUNTER — Encounter: Payer: Self-pay | Admitting: Internal Medicine

## 2023-07-10 VITALS — BP 112/70 | HR 73 | Temp 98.1°F | Ht 69.0 in | Wt 166.0 lb

## 2023-07-10 DIAGNOSIS — E66811 Obesity, class 1: Secondary | ICD-10-CM | POA: Diagnosis not present

## 2023-07-10 DIAGNOSIS — E1169 Type 2 diabetes mellitus with other specified complication: Secondary | ICD-10-CM

## 2023-07-10 DIAGNOSIS — I10 Essential (primary) hypertension: Secondary | ICD-10-CM | POA: Diagnosis not present

## 2023-07-10 DIAGNOSIS — E6609 Other obesity due to excess calories: Secondary | ICD-10-CM

## 2023-07-10 DIAGNOSIS — Z7985 Long-term (current) use of injectable non-insulin antidiabetic drugs: Secondary | ICD-10-CM

## 2023-07-10 DIAGNOSIS — E669 Obesity, unspecified: Secondary | ICD-10-CM

## 2023-07-10 DIAGNOSIS — F419 Anxiety disorder, unspecified: Secondary | ICD-10-CM | POA: Diagnosis not present

## 2023-07-10 DIAGNOSIS — Z6831 Body mass index (BMI) 31.0-31.9, adult: Secondary | ICD-10-CM

## 2023-07-10 MED ORDER — OMEPRAZOLE 40 MG PO CPDR: DELAYED_RELEASE_CAPSULE | ORAL | 3 refills | Status: DC

## 2023-07-10 MED ORDER — TRIAMTERENE-HCTZ 37.5-25 MG PO TABS: 1.0000 | ORAL_TABLET | Freq: Every day | ORAL | 3 refills | Status: DC

## 2023-07-10 MED ORDER — TIRZEPATIDE 10 MG/0.5ML ~~LOC~~ SOAJ: 10.0000 mg | SUBCUTANEOUS | 5 refills | Status: DC

## 2023-07-10 MED ORDER — LORAZEPAM 0.5 MG PO TABS: ORAL_TABLET | ORAL | 1 refills | Status: DC

## 2023-07-10 NOTE — Assessment & Plan Note (Signed)
On Mounjaro 10 mg/wk now

## 2023-07-10 NOTE — Assessment & Plan Note (Signed)
Lorazepam prn  Potential benefits of a long term benzodiazepines  use as well as potential risks  and complications were explained to the patient and were aknowledged.  

## 2023-07-10 NOTE — Assessment & Plan Note (Signed)
Wt Readings from Last 3 Encounters:  07/10/23 166 lb (75.3 kg)  06/05/23 172 lb (78 kg)  12/21/22 190 lb (86.2 kg)  Worse Pt needs Mounjaro

## 2023-07-10 NOTE — Assessment & Plan Note (Signed)
Nl-low BP now Cut back or d/c BP meds

## 2023-07-10 NOTE — Progress Notes (Signed)
Subjective:  Patient ID: Caroline Wong, female    DOB: 1960-12-31  Age: 63 y.o. MRN: [id number]  CC: No chief complaint on file.   HPI MARGAUX ENGEN presents for DM, HTN  Outpatient Medications Prior to Visit  Medication Sig Dispense Refill   albuterol (VENTOLIN HFA) 108 (90 Base) MCG/ACT inhaler Inhale 2 puffs into the lungs every 6 (six) hours as needed for wheezing or shortness of breath. (Patient not taking: Reported on 06/05/2023) 18 g 5   diphenhydrAMINE (BENADRYL) 25 MG tablet Take 2 tablets (50 mg total) by mouth every 4 (four) hours as needed (bee sting). (Patient not taking: Reported on 06/05/2023) 30 tablet 3   EPINEPHrine (EPIPEN 2-PAK) 0.3 mg/0.3 mL IJ SOAJ injection Inject 0.3 mg into the muscle as needed for anaphylaxis. (Patient not taking: Reported on 06/05/2023) 1 each 2   metoprolol succinate (TOPROL-XL) 50 MG 24 hr tablet TAKE 1 TABLET BY MOUTH EVERY DAY WITH OR IMMEDIATELY FOLLOWING A MEAL 90 tablet 3   olmesartan (BENICAR) 40 MG tablet TAKE 1 TABLET(40 MG) BY MOUTH DAILY 90 tablet 3   ondansetron (ZOFRAN) 4 MG tablet Take 1 tablet (4 mg total) by mouth every 8 (eight) hours as needed for nausea or vomiting. (Patient not taking: Reported on 06/05/2023) 20 tablet 1   POTASSIUM PO Take by mouth. (Patient not taking: Reported on 06/05/2023)     zinc gluconate 50 MG tablet Take 50 mg by mouth. (Patient not taking: Reported on 06/05/2023)     azithromycin (ZITHROMAX Z-PAK) 250 MG tablet Take 2 tablets (500 mg) PO today, then 1 tablet (250 mg) PO daily x4 days. 6 tablet 0   LORazepam (ATIVAN) 0.5 MG tablet TAKE 1 TABLET(0.5 MG) BY MOUTH AT BEDTIME AS NEEDED 90 tablet 1   omeprazole (PRILOSEC) 40 MG capsule TAKE 1 CAPSULE(40 MG) BY MOUTH DAILY 90 capsule 3   tirzepatide (MOUNJARO) 10 MG/0.5ML Pen Inject 10 mg into the skin once a week. 6 mL 3   triamterene-hydrochlorothiazide (MAXZIDE-25) 37.5-25 MG tablet TAKE 1 TABLET BY MOUTH DAILY 90 tablet 3   No  facility-administered medications prior to visit.    ROS: Review of Systems  Constitutional:  Positive for fatigue. Negative for activity change, appetite change, chills and unexpected weight change.  HENT:  Negative for congestion, mouth sores and sinus pressure.   Eyes:  Negative for visual disturbance.  Respiratory:  Negative for cough and chest tightness.   Gastrointestinal:  Negative for abdominal pain and nausea.  Genitourinary:  Negative for difficulty urinating, frequency and vaginal pain.  Musculoskeletal:  Negative for back pain and gait problem.  Skin:  Negative for pallor and rash.  Neurological:  Negative for dizziness, tremors, weakness, numbness and headaches.  Psychiatric/Behavioral:  Negative for confusion and sleep disturbance.     Objective:  BP 112/70 (BP Location: Left Arm, Patient Position: Sitting, Cuff Size: Normal)   Pulse 73   Temp 98.1 F (36.7 C) (Oral)   Ht 5\' 9"  (1.753 m)   Wt 166 lb (75.3 kg)   LMP 02/15/2020 (Approximate)   SpO2 98%   BMI 24.51 kg/m   BP Readings from Last 3 Encounters:  07/10/23 112/70  06/05/23 118/86  12/21/22 (!) 143/99    Wt Readings from Last 3 Encounters:  07/10/23 166 lb (75.3 kg)  06/05/23 172 lb (78 kg)  12/21/22 190 lb (86.2 kg)    Physical Exam Constitutional:      General: She is not in acute distress.  Appearance: She is well-developed.  HENT:     Head: Normocephalic.     Right Ear: External ear normal.     Left Ear: External ear normal.     Nose: Nose normal.  Eyes:     General:        Right eye: No discharge.        Left eye: No discharge.     Conjunctiva/sclera: Conjunctivae normal.     Pupils: Pupils are equal, round, and reactive to light.  Neck:     Thyroid: No thyromegaly.     Vascular: No JVD.     Trachea: No tracheal deviation.  Cardiovascular:     Rate and Rhythm: Normal rate and regular rhythm.     Heart sounds: Normal heart sounds.  Pulmonary:     Effort: No respiratory  distress.     Breath sounds: No stridor. No wheezing.  Abdominal:     General: Bowel sounds are normal. There is no distension.     Palpations: Abdomen is soft. There is no mass.     Tenderness: There is no abdominal tenderness. There is no guarding or rebound.  Musculoskeletal:        General: No tenderness.     Cervical back: Normal range of motion and neck supple. No rigidity.  Lymphadenopathy:     Cervical: No cervical adenopathy.  Skin:    Findings: No erythema or rash.  Neurological:     Cranial Nerves: No cranial nerve deficit.     Motor: No abnormal muscle tone.     Coordination: Coordination normal.     Deep Tendon Reflexes: Reflexes normal.  Psychiatric:        Behavior: Behavior normal.        Thought Content: Thought content normal.        Judgment: Judgment normal.     Lab Results  Component Value Date   WBC 11.0 (H) 03/31/2020   HGB 13.1 03/31/2020   HCT 40.0 03/31/2020   PLT 242 03/31/2020   GLUCOSE 101 (H) 06/08/2022   CHOL 165 03/20/2019   TRIG 148.0 03/20/2019   HDL 47.90 03/20/2019   LDLCALC 88 03/20/2019   ALT 27 06/08/2022   AST 23 06/08/2022   NA 140 06/08/2022   K 4.3 06/08/2022   CL 103 06/08/2022   CREATININE 0.97 06/08/2022   BUN 19 06/08/2022   CO2 29 06/08/2022   TSH 0.95 09/10/2019   INR 0.9 02/06/2018   HGBA1C 6.1 06/08/2022    MM 3D SCREENING MAMMOGRAM BILATERAL BREAST Result Date: 12/02/2022 CLINICAL DATA:  Screening. EXAM: DIGITAL SCREENING BILATERAL MAMMOGRAM WITH TOMOSYNTHESIS AND CAD TECHNIQUE: Bilateral screening digital craniocaudal and mediolateral oblique mammograms were obtained. Bilateral screening digital breast tomosynthesis was performed. The images were evaluated with computer-aided detection. COMPARISON:  Previous exam(s). ACR Breast Density Category b: There are scattered areas of fibroglandular density. FINDINGS: There are no findings suspicious for malignancy. IMPRESSION: No mammographic evidence of malignancy. A  result letter of this screening mammogram will be mailed directly to the patient. RECOMMENDATION: Screening mammogram in one year. (Code:SM-B-01Y) BI-RADS CATEGORY  1: Negative. Electronically Signed   By: Emmaline Kluver M.D.   On: 12/02/2022 09:54    Assessment & Plan:   Problem List Items Addressed This Visit     Hypertension - Primary   Nl-low BP now Cut back or d/c BP meds      Relevant Medications   triamterene-hydrochlorothiazide (MAXZIDE-25) 37.5-25 MG tablet   Obesity   Wt  Readings from Last 3 Encounters:  07/10/23 166 lb (75.3 kg)  06/05/23 172 lb (78 kg)  12/21/22 190 lb (86.2 kg)  Worse Pt needs Mounjaro       Relevant Medications   tirzepatide (MOUNJARO) 10 MG/0.5ML Pen   Anxiety   Lorazepam prn  Potential benefits of a long term benzodiazepines  use as well as potential risks  and complications were explained to the patient and were aknowledged.       Relevant Medications   LORazepam (ATIVAN) 0.5 MG tablet   Type 2 diabetes mellitus with obesity (HCC)   On Mounjaro 10 mg/wk now      Relevant Medications   tirzepatide (MOUNJARO) 10 MG/0.5ML Pen   Other Relevant Orders   Hemoglobin A1c   Urine microalbumin-creatinine with uACR   Comprehensive metabolic panel      Meds ordered this encounter  Medications   LORazepam (ATIVAN) 0.5 MG tablet    Sig: TAKE 1 TABLET(0.5 MG) BY MOUTH AT BEDTIME AS NEEDED    Dispense:  90 tablet    Refill:  1   omeprazole (PRILOSEC) 40 MG capsule    Sig: TAKE 1 CAPSULE(40 MG) BY MOUTH DAILY    Dispense:  90 capsule    Refill:  3   triamterene-hydrochlorothiazide (MAXZIDE-25) 37.5-25 MG tablet    Sig: Take 1 tablet by mouth daily.    Dispense:  90 tablet    Refill:  3   tirzepatide (MOUNJARO) 10 MG/0.5ML Pen    Sig: Inject 10 mg into the skin once a week.    Dispense:  6 mL    Refill:  5      Follow-up: Return in about 3 months (around 10/07/2023) for a follow-up visit.  Sonda Primes, MD

## 2023-08-23 ENCOUNTER — Other Ambulatory Visit: Payer: Self-pay | Admitting: Adult Health

## 2023-08-23 DIAGNOSIS — Z1231 Encounter for screening mammogram for malignant neoplasm of breast: Secondary | ICD-10-CM

## 2023-09-14 ENCOUNTER — Other Ambulatory Visit: Payer: Self-pay | Admitting: Internal Medicine

## 2023-09-20 ENCOUNTER — Other Ambulatory Visit: Payer: Self-pay | Admitting: Internal Medicine

## 2023-10-16 ENCOUNTER — Telehealth (INDEPENDENT_AMBULATORY_CARE_PROVIDER_SITE_OTHER): Admitting: Family Medicine

## 2023-10-16 ENCOUNTER — Ambulatory Visit: Payer: Federal, State, Local not specified - PPO | Admitting: Internal Medicine

## 2023-10-16 ENCOUNTER — Encounter: Payer: Self-pay | Admitting: Family Medicine

## 2023-10-16 DIAGNOSIS — B9689 Other specified bacterial agents as the cause of diseases classified elsewhere: Secondary | ICD-10-CM | POA: Diagnosis not present

## 2023-10-16 DIAGNOSIS — R110 Nausea: Secondary | ICD-10-CM | POA: Diagnosis not present

## 2023-10-16 DIAGNOSIS — J329 Chronic sinusitis, unspecified: Secondary | ICD-10-CM

## 2023-10-16 MED ORDER — ONDANSETRON 4 MG PO TBDP: 4.0000 mg | ORAL_TABLET | Freq: Three times a day (TID) | ORAL | 0 refills | Status: AC | PRN

## 2023-10-16 MED ORDER — AZITHROMYCIN 250 MG PO TABS: ORAL_TABLET | ORAL | 0 refills | Status: AC

## 2023-10-16 NOTE — Patient Instructions (Signed)
 I have sent in azithromycin  for you to take.  Take 2 tablets today, then 1 tablet daily for the next 4 days.  I have sent in Zofran  for you to take 1 tablet every 8 hours as needed for nausea and vomiting.  Follow-up with me for new or worsening symptoms.

## 2023-10-16 NOTE — Progress Notes (Signed)
 Virtual Visit via Video Note  I connected with Caroline Wong on 10/16/23 at 11:20 AM EDT by a video enabled telemedicine application and verified that I am speaking with the correct person using two identifiers.  Patient Location: Home Provider Location: Office/Clinic  I discussed the limitations, risks, security, and privacy concerns of performing an evaluation and management service by video and the availability of in person appointments. I also discussed with the patient that there may be a patient responsible charge related to this service. The patient expressed understanding and agreed to proceed.  Subjective: PCP: Genia Kettering, MD  Chief Complaint  Patient presents with   Acute Visit    Ongoing since Saturday, head pressure, roof of the mouth hurts, yellow/green mucus, nasal drainage. Has tried Mucinex   HPI 63 yo F presents for AV visit for evaluation of sinus pain, pressure, nasal congestion for the last week or so. Symptoms have progressed over the last 3 days. Has taken OTC cough and cold with little relief. Endorses mild nausea with nasal congestion.  Inquiring about Zofran  prescription. Denies known sick contacts. Denies fever, chills, abdominal pain, vomiting, diarrhea, rash, fever, other symptoms. Denies other concerns today.  ROS: Per HPI  Current Outpatient Medications:    azithromycin  (ZITHROMAX ) 250 MG tablet, Take 2 tablets on day 1, then 1 tablet daily on days 2 through 5, Disp: 6 tablet, Rfl: 0   diphenhydrAMINE  (BENADRYL ) 25 MG tablet, Take 2 tablets (50 mg total) by mouth every 4 (four) hours as needed (bee sting)., Disp: 30 tablet, Rfl: 3   EPINEPHrine  (EPIPEN  2-PAK) 0.3 mg/0.3 mL IJ SOAJ injection, Inject 0.3 mg into the muscle as needed for anaphylaxis., Disp: 1 each, Rfl: 2   LORazepam  (ATIVAN ) 0.5 MG tablet, TAKE 1 TABLET(0.5 MG) BY MOUTH AT BEDTIME AS NEEDED, Disp: 90 tablet, Rfl: 1   metoprolol  succinate (TOPROL -XL) 50 MG 24 hr tablet, TAKE  1 TABLET BY MOUTH EVERY DAY WITH OR IMMEDIATELY FOLLOWING A MEAL, Disp: 90 tablet, Rfl: 3   olmesartan  (BENICAR ) 40 MG tablet, TAKE 1 TABLET(40 MG) BY MOUTH DAILY, Disp: 90 tablet, Rfl: 3   omeprazole  (PRILOSEC) 40 MG capsule, TAKE 1 CAPSULE(40 MG) BY MOUTH DAILY, Disp: 90 capsule, Rfl: 3   ondansetron  (ZOFRAN -ODT) 4 MG disintegrating tablet, Take 1 tablet (4 mg total) by mouth every 8 (eight) hours as needed for nausea or vomiting., Disp: 20 tablet, Rfl: 0   POTASSIUM PO, Take by mouth., Disp: , Rfl:    tirzepatide  (MOUNJARO ) 10 MG/0.5ML Pen, Inject 10 mg into the skin once a week., Disp: 6 mL, Rfl: 5   triamterene -hydrochlorothiazide  (MAXZIDE -25) 37.5-25 MG tablet, Take 1 tablet by mouth daily., Disp: 90 tablet, Rfl: 3   zinc gluconate 50 MG tablet, Take 50 mg by mouth., Disp: , Rfl:    albuterol  (VENTOLIN  HFA) 108 (90 Base) MCG/ACT inhaler, INHALE 2 PUFFS INTO THE LUNGS EVERY 6 HOURS AS NEEDED FOR WHEEZING OR SHORTNESS OF BREATH (Patient not taking: Reported on 10/16/2023), Disp: 18 g, Rfl: 5  Observations/Objective: There were no vitals filed for this visit. Physical Exam Vitals and nursing note reviewed.  Constitutional:      General: She is not in acute distress.    Comments: Appears fatigued  HENT:     Head: Normocephalic and atraumatic.  Eyes:     Extraocular Movements: Extraocular movements intact.  Pulmonary:     Effort: Pulmonary effort is normal.  Musculoskeletal:     Cervical back: Normal range of  motion.  Neurological:     General: No focal deficit present.     Mental Status: She is alert and oriented to person, place, and time.  Psychiatric:        Mood and Affect: Mood normal.        Behavior: Behavior normal.     Assessment and Plan: Bacterial sinusitis -     Azithromycin ; Take 2 tablets on day 1, then 1 tablet daily on days 2 through 5  Dispense: 6 tablet; Refill: 0  Nausea -     Ondansetron ; Take 1 tablet (4 mg total) by mouth every 8 (eight) hours as needed  for nausea or vomiting.  Dispense: 20 tablet; Refill: 0    Follow Up Instructions: Return if symptoms worsen or fail to improve.   I discussed the assessment and treatment plan with the patient. The patient was provided an opportunity to ask questions, and all were answered. The patient agreed with the plan and demonstrated an understanding of the instructions.   The patient was advised to call back or seek an in-person evaluation if the symptoms worsen or if the condition fails to improve as anticipated.  The above assessment and management plan was discussed with the patient. The patient verbalized understanding of and has agreed to the management plan.   Wellington Half, FNP

## 2023-11-20 ENCOUNTER — Encounter: Payer: Self-pay | Admitting: Internal Medicine

## 2023-11-20 ENCOUNTER — Ambulatory Visit: Admitting: Internal Medicine

## 2023-11-20 VITALS — BP 118/70 | HR 69 | Temp 98.1°F | Ht 69.0 in | Wt 165.0 lb

## 2023-11-20 DIAGNOSIS — E669 Obesity, unspecified: Secondary | ICD-10-CM

## 2023-11-20 DIAGNOSIS — E1169 Type 2 diabetes mellitus with other specified complication: Secondary | ICD-10-CM | POA: Diagnosis not present

## 2023-11-20 DIAGNOSIS — I10 Essential (primary) hypertension: Secondary | ICD-10-CM | POA: Diagnosis not present

## 2023-11-20 DIAGNOSIS — M546 Pain in thoracic spine: Secondary | ICD-10-CM

## 2023-11-20 DIAGNOSIS — Z7985 Long-term (current) use of injectable non-insulin antidiabetic drugs: Secondary | ICD-10-CM

## 2023-11-20 DIAGNOSIS — J069 Acute upper respiratory infection, unspecified: Secondary | ICD-10-CM | POA: Diagnosis not present

## 2023-11-20 LAB — COMPREHENSIVE METABOLIC PANEL WITH GFR
ALT: 13 U/L (ref 0–35)
AST: 18 U/L (ref 0–37)
Albumin: 4.4 g/dL (ref 3.5–5.2)
Alkaline Phosphatase: 85 U/L (ref 39–117)
BUN: 19 mg/dL (ref 6–23)
CO2: 32 meq/L (ref 19–32)
Calcium: 9.6 mg/dL (ref 8.4–10.5)
Chloride: 101 meq/L (ref 96–112)
Creatinine, Ser: 0.97 mg/dL (ref 0.40–1.20)
GFR: 63.47 mL/min (ref >60.00)
Glucose, Bld: 96 mg/dL (ref 70–99)
Potassium: 4.3 meq/L (ref 3.5–5.1)
Sodium: 138 meq/L (ref 135–145)
Total Bilirubin: 0.5 mg/dL (ref 0.2–1.2)
Total Protein: 7.9 g/dL (ref 6.0–8.3)

## 2023-11-20 LAB — MICROALBUMIN / CREATININE URINE RATIO
Creatinine,U: 35.2 mg/dL
Microalb Creat Ratio: UNDETERMINED mg/g (ref 0.0–30.0)
Microalb, Ur: <0.7 mg/dL

## 2023-11-20 LAB — HEMOGLOBIN A1C: Hgb A1c MFr Bld: 5.7 % (ref 4.6–6.5)

## 2023-11-20 MED ORDER — CYCLOBENZAPRINE HCL 10 MG PO TABS: 10.0000 mg | ORAL_TABLET | Freq: Three times a day (TID) | ORAL | 1 refills | Status: AC | PRN

## 2023-11-20 MED ORDER — AZITHROMYCIN 250 MG PO TABS: ORAL_TABLET | ORAL | 0 refills | Status: DC

## 2023-11-20 MED ORDER — FLUCONAZOLE 150 MG PO TABS
150.0000 mg | ORAL_TABLET | Freq: Once | ORAL | 0 refills | Status: AC
Start: 2023-11-20 — End: 2023-11-20

## 2023-11-20 NOTE — Assessment & Plan Note (Signed)
 Zpac Diflucan  prn

## 2023-11-20 NOTE — Assessment & Plan Note (Signed)
 Nl-low BP now We cut back on BP meds - no sx's

## 2023-11-20 NOTE — Progress Notes (Signed)
 Subjective:  Patient ID: Caroline Wong, female    DOB: 12/09/1960  Age: 63 y.o. MRN: [id number]  CC: Medical Management of Chronic Issues (3 mnth f/u, Pt believes allergies are causing post nasal drip that is causing itchy throat and left ear has clogged itchy feeling )   HPI AMEIRA ALESSANDRINI presents for URI sx's C/o LBP x 1 d - pulled a muscle  Outpatient Medications Prior to Visit  Medication Sig Dispense Refill   albuterol  (VENTOLIN  HFA) 108 (90 Base) MCG/ACT inhaler INHALE 2 PUFFS INTO THE LUNGS EVERY 6 HOURS AS NEEDED FOR WHEEZING OR SHORTNESS OF BREATH 18 g 5   diphenhydrAMINE  (BENADRYL ) 25 MG tablet Take 2 tablets (50 mg total) by mouth every 4 (four) hours as needed (bee sting). 30 tablet 3   EPINEPHrine  (EPIPEN  2-PAK) 0.3 mg/0.3 mL IJ SOAJ injection Inject 0.3 mg into the muscle as needed for anaphylaxis. 1 each 2   LORazepam  (ATIVAN ) 0.5 MG tablet TAKE 1 TABLET(0.5 MG) BY MOUTH AT BEDTIME AS NEEDED 90 tablet 1   metoprolol  succinate (TOPROL -XL) 50 MG 24 hr tablet TAKE 1 TABLET BY MOUTH EVERY DAY WITH OR IMMEDIATELY FOLLOWING A MEAL 90 tablet 3   olmesartan  (BENICAR ) 40 MG tablet TAKE 1 TABLET(40 MG) BY MOUTH DAILY 90 tablet 3   omeprazole  (PRILOSEC) 40 MG capsule TAKE 1 CAPSULE(40 MG) BY MOUTH DAILY 90 capsule 3   ondansetron  (ZOFRAN -ODT) 4 MG disintegrating tablet Take 1 tablet (4 mg total) by mouth every 8 (eight) hours as needed for nausea or vomiting. 20 tablet 0   POTASSIUM PO Take by mouth.     tirzepatide  (MOUNJARO ) 10 MG/0.5ML Pen Inject 10 mg into the skin once a week. 6 mL 5   triamterene -hydrochlorothiazide  (MAXZIDE -25) 37.5-25 MG tablet Take 1 tablet by mouth daily. 90 tablet 3   zinc gluconate 50 MG tablet Take 50 mg by mouth.     No facility-administered medications prior to visit.    ROS: Review of Systems  Constitutional:  Negative for activity change, appetite change, chills, fatigue and unexpected weight change.  HENT:  Negative for congestion, mouth  sores and sinus pressure.   Eyes:  Negative for visual disturbance.  Respiratory:  Negative for cough and chest tightness.   Gastrointestinal:  Negative for abdominal pain and nausea.  Genitourinary:  Negative for difficulty urinating, frequency and vaginal pain.  Musculoskeletal:  Positive for back pain. Negative for gait problem.  Skin:  Negative for pallor and rash.  Neurological:  Negative for dizziness, tremors, weakness, numbness and headaches.  Psychiatric/Behavioral:  Negative for confusion and sleep disturbance. The patient is not nervous/anxious.     Objective:  BP 118/70   Pulse 69   Temp 98.1 F (36.7 C) (Oral)   Ht 5' 9 (1.753 m)   Wt 165 lb (74.8 kg)   LMP 02/15/2020 (Approximate)   SpO2 96%   BMI 24.37 kg/m   BP Readings from Last 3 Encounters:  11/20/23 118/70  07/10/23 112/70  06/05/23 118/86    Wt Readings from Last 3 Encounters:  11/20/23 165 lb (74.8 kg)  07/10/23 166 lb (75.3 kg)  06/05/23 172 lb (78 kg)    Physical Exam Constitutional:      General: She is not in acute distress.    Appearance: Normal appearance. She is well-developed.  HENT:     Head: Normocephalic.     Right Ear: External ear normal.     Left Ear: External ear normal.  Nose: Nose normal.   Eyes:     General:        Right eye: No discharge.        Left eye: No discharge.     Conjunctiva/sclera: Conjunctivae normal.     Pupils: Pupils are equal, round, and reactive to light.   Neck:     Thyroid : No thyromegaly.     Vascular: No JVD.     Trachea: No tracheal deviation.   Cardiovascular:     Rate and Rhythm: Normal rate and regular rhythm.     Heart sounds: Normal heart sounds.  Pulmonary:     Effort: No respiratory distress.     Breath sounds: No stridor. No wheezing.  Abdominal:     General: Bowel sounds are normal. There is no distension.     Palpations: Abdomen is soft. There is no mass.     Tenderness: There is no abdominal tenderness. There is no guarding  or rebound.   Musculoskeletal:        General: No tenderness.     Cervical back: Normal range of motion and neck supple. No rigidity.  Lymphadenopathy:     Cervical: No cervical adenopathy.   Skin:    Findings: No erythema or rash.   Neurological:     Mental Status: She is oriented to person, place, and time.     Cranial Nerves: No cranial nerve deficit.     Motor: No abnormal muscle tone.     Coordination: Coordination normal.     Deep Tendon Reflexes: Reflexes normal.   Psychiatric:        Behavior: Behavior normal.        Thought Content: Thought content normal.        Judgment: Judgment normal.   Eryth throat B TMs - nl L thor spine w/pain  Lab Results  Component Value Date   WBC 11.0 (H) 03/31/2020   HGB 13.1 03/31/2020   HCT 40.0 03/31/2020   PLT 242 03/31/2020   GLUCOSE 101 (H) 06/08/2022   CHOL 165 03/20/2019   TRIG 148.0 03/20/2019   HDL 47.90 03/20/2019   LDLCALC 88 03/20/2019   ALT 27 06/08/2022   AST 23 06/08/2022   NA 140 06/08/2022   K 4.3 06/08/2022   CL 103 06/08/2022   CREATININE 0.97 06/08/2022   BUN 19 06/08/2022   CO2 29 06/08/2022   TSH 0.95 09/10/2019   INR 0.9 02/06/2018   HGBA1C 6.1 06/08/2022    MM 3D SCREENING MAMMOGRAM BILATERAL BREAST Result Date: 12/02/2022 CLINICAL DATA:  Screening. EXAM: DIGITAL SCREENING BILATERAL MAMMOGRAM WITH TOMOSYNTHESIS AND CAD TECHNIQUE: Bilateral screening digital craniocaudal and mediolateral oblique mammograms were obtained. Bilateral screening digital breast tomosynthesis was performed. The images were evaluated with computer-aided detection. COMPARISON:  Previous exam(s). ACR Breast Density Category b: There are scattered areas of fibroglandular density. FINDINGS: There are no findings suspicious for malignancy. IMPRESSION: No mammographic evidence of malignancy. A result letter of this screening mammogram will be mailed directly to the patient. RECOMMENDATION: Screening mammogram in one year.  (Code:SM-B-01Y) BI-RADS CATEGORY  1: Negative. Electronically Signed   By: Allena Ito M.D.   On: 12/02/2022 09:54    Assessment & Plan:   Problem List Items Addressed This Visit     Hypertension   Nl-low BP now We cut back on BP meds - no sx's      Back pain - Primary   Rhomboid muscle strain Flexeril  prn  Heat  Relevant Medications   cyclobenzaprine  (FLEXERIL ) 10 MG tablet   URI (upper respiratory infection)   Zpac Diflucan  prn      Relevant Medications   fluconazole  (DIFLUCAN ) 150 MG tablet   azithromycin  (ZITHROMAX  Z-PAK) 250 MG tablet   Type 2 diabetes mellitus with obesity (HCC)   On Mounjaro  10 mg/wk          Meds ordered this encounter  Medications   cyclobenzaprine  (FLEXERIL ) 10 MG tablet    Sig: Take 1 tablet (10 mg total) by mouth 3 (three) times daily as needed for muscle spasms.    Dispense:  30 tablet    Refill:  1   fluconazole  (DIFLUCAN ) 150 MG tablet    Sig: Take 1 tablet (150 mg total) by mouth once for 1 dose.    Dispense:  1 tablet    Refill:  0   azithromycin  (ZITHROMAX  Z-PAK) 250 MG tablet    Sig: As directed    Dispense:  6 tablet    Refill:  0      Follow-up: Return in about 3 months (around 02/20/2024) for a follow-up visit.  Anitra Barn, MD

## 2023-11-20 NOTE — Assessment & Plan Note (Signed)
 Rhomboid muscle strain Flexeril  prn  Heat

## 2023-11-20 NOTE — Assessment & Plan Note (Signed)
 On Mounjaro  10 mg/wk

## 2023-11-21 ENCOUNTER — Ambulatory Visit: Payer: Self-pay | Admitting: Internal Medicine

## 2023-11-30 ENCOUNTER — Ambulatory Visit
Admission: RE | Admit: 2023-11-30 | Discharge: 2023-11-30 | Disposition: A | Discharge status: Home / Self Care | Source: Ambulatory Visit | Attending: Adult Health | Admitting: Adult Health

## 2023-11-30 DIAGNOSIS — Z1231 Encounter for screening mammogram for malignant neoplasm of breast: Secondary | ICD-10-CM

## 2023-12-03 ENCOUNTER — Encounter: Payer: Self-pay | Admitting: Internal Medicine

## 2023-12-04 ENCOUNTER — Ambulatory Visit: Payer: Self-pay | Admitting: Adult Health

## 2023-12-10 ENCOUNTER — Other Ambulatory Visit: Payer: Self-pay | Admitting: Internal Medicine

## 2023-12-10 MED ORDER — TIRZEPATIDE 12.5 MG/0.5ML ~~LOC~~ SOAJ: 12.5000 mg | SUBCUTANEOUS | 1 refills | Status: DC

## 2023-12-25 ENCOUNTER — Other Ambulatory Visit: Payer: Self-pay | Admitting: Internal Medicine

## 2024-01-02 ENCOUNTER — Ambulatory Visit: Admitting: Adult Health

## 2024-01-02 ENCOUNTER — Encounter: Payer: Self-pay | Admitting: Adult Health

## 2024-01-02 VITALS — BP 136/78 | HR 68 | Ht 69.0 in | Wt 164.0 lb

## 2024-01-02 DIAGNOSIS — W57XXXA Bitten or stung by nonvenomous insect and other nonvenomous arthropods, initial encounter: Secondary | ICD-10-CM

## 2024-01-02 DIAGNOSIS — Z1211 Encounter for screening for malignant neoplasm of colon: Secondary | ICD-10-CM | POA: Diagnosis not present

## 2024-01-02 DIAGNOSIS — Z90710 Acquired absence of both cervix and uterus: Secondary | ICD-10-CM | POA: Diagnosis not present

## 2024-01-02 DIAGNOSIS — I10 Essential (primary) hypertension: Secondary | ICD-10-CM

## 2024-01-02 DIAGNOSIS — Z01419 Encounter for gynecological examination (general) (routine) without abnormal findings: Secondary | ICD-10-CM | POA: Diagnosis not present

## 2024-01-02 DIAGNOSIS — Z90721 Acquired absence of ovaries, unilateral: Secondary | ICD-10-CM | POA: Diagnosis not present

## 2024-01-02 LAB — HEMOCCULT GUIAC POC 1CARD (OFFICE): Fecal Occult Blood, POC: NEGATIVE

## 2024-01-02 MED ORDER — HYDROCORTISONE 2.5 % EX CREA: TOPICAL_CREAM | Freq: Two times a day (BID) | CUTANEOUS | 1 refills | Status: AC

## 2024-01-02 NOTE — Progress Notes (Signed)
 Patient ID: Caroline Wong, female   DOB: May 03, 1961, 63 y.o.   MRN: [id number] History of Present Illness: Caroline Wong is a 63 year old white female, married, sp hysterectomy in for a well woman gyn exam. Caroline Wong is working at [employer] in [place] now, less stressful. Has lost about 70 lbs in last 2 years on mounjaro . Has some itching at night from bug bites on legs.   PCP is Dr Garald   Current Medications, Allergies, Past Medical History, Past Surgical History, Family History and Social History were reviewed in [employer] record.     Review of Systems: Patient denies any headaches, hearing loss, fatigue, blurred vision, shortness of breath, chest pain, abdominal pain, problems with bowel movements, urination, or intercourse. No joint pain or mood swings.  + burps with mounjaro , will take antacid and if gets constipated, will use stool softner   Physical Exam:BP 136/78 (BP Location: Left Arm, Patient Position: Sitting, Cuff Size: Normal)   Pulse 68   Ht 5' 9 (1.753 m)   Wt 164 lb (74.4 kg)   LMP 02/15/2020 (Approximate)   BMI 24.22 kg/m   General:  Well developed, well nourished, no acute distress Skin:  Warm and dry Neck:  Midline trachea, normal thyroid , good ROM, no lymphadenopathy,no carotid bruits heard Lungs; Clear to auscultation bilaterally Breast:  No dominant palpable mass, retraction, or nipple discharge Cardiovascular: Regular rate and rhythm Abdomen:  Soft, non tender, no hepatosplenomegaly Pelvic:  External genitalia is normal in appearance, no lesions.  The vagina is normal in pale. Urethra has no lesions or masses. The cervix and uterus are absent. No adnexal masses or tenderness noted.Bladder is non tender, no masses felt. Rectal: Good sphincter tone, no polyps, or hemorrhoids felt.  Hemoccult negative. Extremities/musculoskeletal:  No swelling or varicosities noted, no clubbing or cyanosis, has red areas upper thighs, R>L, ?bug bites, itches more at  night Psych:  No mood changes, alert and cooperative,seems happy AA is 1 Fall risk is low Caroline Wong refused PHQ 9 and GAD 7  [facility] - 01/02/24 1039       Pregnancy Intention Screening   Does the patient want to become pregnant in the next year? N/A    Does the patient's partner want to become pregnant in the next year? N/A    Would the patient like to discuss contraceptive options today? N/A      Contraception Wrap Up   Current Method Female Sterilization   hyst   End Method Female Sterilization   hyst   Contraception Counseling Provided No          Examination chaperoned by Clarita Salt LPN   Impression and plan: 1. Encounter for well woman exam with routine gynecological exam (Primary) Physical in 1 year Labs with PCP Mammogram was negative 11/30/23  2. S/P hysterectomy with oophorectomy  3. Encounter for screening fecal occult blood testing Hemoccult was negative  - POCT occult blood stool  4. Hypertension, unspecified type She has decreased meds, still taking, follow up with PCP   5. Bug bite, initial encounter Will rx hydrocortisone  2.5 % cream, can take benadryl  or zyrtec  Meds ordered this encounter  Medications   hydrocortisone  2.5 % cream    Sig: Apply topically 2 (two) times daily.    Dispense:  30 g    Refill:  1    Supervising Provider:   JAYNE MINDER H [[id number]]

## 2024-01-16 ENCOUNTER — Other Ambulatory Visit (HOSPITAL_COMMUNITY): Payer: Self-pay

## 2024-02-02 ENCOUNTER — Other Ambulatory Visit: Payer: Self-pay | Admitting: Internal Medicine

## 2024-02-07 ENCOUNTER — Encounter: Payer: Self-pay | Admitting: Internal Medicine

## 2024-02-07 ENCOUNTER — Telehealth: Admitting: Internal Medicine

## 2024-02-07 VITALS — Ht 69.0 in | Wt 164.0 lb

## 2024-02-07 DIAGNOSIS — H9201 Otalgia, right ear: Secondary | ICD-10-CM

## 2024-02-07 MED ORDER — AZITHROMYCIN 250 MG PO TABS: ORAL_TABLET | ORAL | 0 refills | Status: AC

## 2024-02-07 MED ORDER — IBUPROFEN 600 MG PO TABS: ORAL_TABLET | ORAL | 3 refills | Status: AC

## 2024-02-07 NOTE — Assessment & Plan Note (Signed)
 Trigeminal neuralgia vs OM Heat to face NSAID - see Rx Abx if not better in 1-2 d- see Rx

## 2024-02-07 NOTE — Progress Notes (Signed)
 Virtual Visit via Video Note  I connected with Caroline Wong on 02/07/24 at  3:20 PM EDT by a video enabled telemedicine application and verified that I am speaking with the correct person using two identifiers.   I discussed the limitations of evaluation and management by telemedicine and the availability of in person appointments. The patient expressed understanding and agreed to proceed.  I was located at our [facility] office. The patient was at home. There was no one else present in the visit.  Chief Complaint  Patient presents with   Ear Pain    R ear pain, started last night; sometimes sharp, dull aching pain, not tender to touch but when swallowing pain goes down into neck; headaches; haven't taken any meds; no other symptoms     History of Present Illness:  C/o R ear, face pain and ST x 2-3 d - episodic Review of Systems  Constitutional:  Negative for chills, fever and malaise/fatigue.  HENT:  Positive for ear pain, sinus pain and sore throat. Negative for congestion and hearing loss.   Eyes:  Negative for pain.  Respiratory:  Negative for cough, shortness of breath and stridor.   Cardiovascular:  Negative for chest pain and palpitations.  Genitourinary:  Negative for urgency.     Observations/Objective: The patient appears to be in no acute distress  Assessment and Plan:  Problem List Items Addressed This Visit     Earache on right - Primary   Trigeminal neuralgia vs OM Heat to face NSAID - see Rx Abx if not better in 1-2 d- see Rx         Meds ordered this encounter  Medications   ibuprofen  (ADVIL ) 600 MG tablet    Sig: Take tid x 2 days, then tid prn pain    Dispense:  60 tablet    Refill:  3   azithromycin  (ZITHROMAX  Z-PAK) 250 MG tablet    Sig: As directed    Dispense:  6 tablet    Refill:  0     Follow Up Instructions:    I discussed the assessment and treatment plan with the patient. The patient was provided an opportunity to ask  questions and all were answered. The patient agreed with the plan and demonstrated an understanding of the instructions.   The patient was advised to call back or seek an in-person evaluation if the symptoms worsen or if the condition fails to improve as anticipated.  I provided face-to-face time during this encounter. We were at different locations.   Marolyn Noel, MD

## 2024-02-08 ENCOUNTER — Telehealth: Payer: Self-pay

## 2024-02-08 ENCOUNTER — Other Ambulatory Visit (HOSPITAL_COMMUNITY): Payer: Self-pay

## 2024-02-08 NOTE — Telephone Encounter (Signed)
 Pharmacy Patient Advocate Encounter   Received notification from CoverMyMeds that prior authorization for Mounjaro  12.5MG /0.5ML auto-injectors  is due for renewal.   Insurance verification completed.   The patient is insured through [facility].  Action: PA required and submitted KEY/EOC/Request #: [id number] from 02/08/24 to 02/07/25

## 2024-02-16 DIAGNOSIS — R030 Elevated blood-pressure reading, without diagnosis of hypertension: Secondary | ICD-10-CM | POA: Diagnosis not present

## 2024-02-16 DIAGNOSIS — H6691 Otitis media, unspecified, right ear: Secondary | ICD-10-CM | POA: Diagnosis not present

## 2024-02-20 ENCOUNTER — Encounter: Payer: Self-pay | Admitting: Internal Medicine

## 2024-03-14 ENCOUNTER — Other Ambulatory Visit (HOSPITAL_COMMUNITY): Payer: Self-pay

## 2024-03-25 ENCOUNTER — Ambulatory Visit: Admitting: Internal Medicine

## 2024-04-01 ENCOUNTER — Ambulatory Visit: Admitting: Internal Medicine

## 2024-04-01 ENCOUNTER — Encounter: Payer: Self-pay | Admitting: Internal Medicine

## 2024-04-01 VITALS — BP 152/96 | HR 79 | Temp 98.3°F | Ht 69.0 in

## 2024-04-01 DIAGNOSIS — I10 Essential (primary) hypertension: Secondary | ICD-10-CM | POA: Diagnosis not present

## 2024-04-01 DIAGNOSIS — Z6824 Body mass index (BMI) 24.0-24.9, adult: Secondary | ICD-10-CM

## 2024-04-01 DIAGNOSIS — E669 Obesity, unspecified: Secondary | ICD-10-CM | POA: Diagnosis not present

## 2024-04-01 DIAGNOSIS — Z7985 Long-term (current) use of injectable non-insulin antidiabetic drugs: Secondary | ICD-10-CM

## 2024-04-01 DIAGNOSIS — E119 Type 2 diabetes mellitus without complications: Secondary | ICD-10-CM | POA: Diagnosis not present

## 2024-04-01 NOTE — Progress Notes (Signed)
 Subjective:  Patient ID: Caroline Wong, female    DOB: March 15, 1961  Age: 63 y.o. MRN: [id number]  CC: Medical Management of Chronic Issues (4 Month follow up)   HPI Caroline Wong presents for DM,HTN  Outpatient Medications Prior to Visit  Medication Sig Dispense Refill   albuterol  (VENTOLIN  HFA) 108 (90 Base) MCG/ACT inhaler INHALE 2 PUFFS INTO THE LUNGS EVERY 6 HOURS AS NEEDED FOR WHEEZING OR SHORTNESS OF BREATH 18 g 5   azithromycin  (ZITHROMAX  Z-PAK) 250 MG tablet As directed 6 tablet 0   cyclobenzaprine  (FLEXERIL ) 10 MG tablet Take 1 tablet (10 mg total) by mouth 3 (three) times daily as needed for muscle spasms. 30 tablet 1   diphenhydrAMINE  (BENADRYL ) 25 MG tablet Take 2 tablets (50 mg total) by mouth every 4 (four) hours as needed (bee sting). 30 tablet 3   EPINEPHrine  (EPIPEN  2-PAK) 0.3 mg/0.3 mL IJ SOAJ injection Inject 0.3 mg into the muscle as needed for anaphylaxis. 1 each 2   hydrocortisone  2.5 % cream Apply topically 2 (two) times daily. 30 g 1   ibuprofen  (ADVIL ) 600 MG tablet Take tid x 2 days, then tid prn pain 60 tablet 3   LORazepam  (ATIVAN ) 0.5 MG tablet TAKE 1 TABLET(0.5 MG) BY MOUTH AT BEDTIME AS NEEDED 90 tablet 1   metoprolol  succinate (TOPROL -XL) 50 MG 24 hr tablet TAKE 1 TABLET BY MOUTH EVERY DAY WITH OR IMMEDIATELY FOLLOWING A MEAL 90 tablet 3   olmesartan  (BENICAR ) 40 MG tablet TAKE 1 TABLET(40 MG) BY MOUTH DAILY 90 tablet 3   omeprazole  (PRILOSEC) 40 MG capsule TAKE 1 CAPSULE(40 MG) BY MOUTH DAILY 90 capsule 3   ondansetron  (ZOFRAN -ODT) 4 MG disintegrating tablet Take 1 tablet (4 mg total) by mouth every 8 (eight) hours as needed for nausea or vomiting. 20 tablet 0   tirzepatide  (MOUNJARO ) 12.5 MG/0.5ML Pen Inject 12.5 mg into the skin once a week. 6 mL 1   triamterene -hydrochlorothiazide  (MAXZIDE -25) 37.5-25 MG tablet TAKE 1 TABLET BY MOUTH DAILY 90 tablet 3   zinc gluconate 50 MG tablet Take 50 mg by mouth.     No facility-administered medications prior  to visit.    ROS: Review of Systems  Constitutional:  Negative for activity change, appetite change, chills, fatigue and unexpected weight change.  HENT:  Negative for congestion, mouth sores and sinus pressure.   Eyes:  Negative for visual disturbance.  Respiratory:  Negative for cough and chest tightness.   Gastrointestinal:  Negative for abdominal pain and nausea.  Genitourinary:  Negative for difficulty urinating, frequency and vaginal pain.  Musculoskeletal:  Negative for back pain and gait problem.  Skin:  Negative for pallor and rash.  Neurological:  Negative for dizziness, tremors, weakness, numbness and headaches.  Psychiatric/Behavioral:  Negative for confusion and sleep disturbance.     Objective:  BP (!) 152/96   Pulse 79   Temp 98.3 F (36.8 C)   Ht 5' 9 (1.753 m)   LMP 02/15/2020 (Approximate)   SpO2 99%   BMI 24.22 kg/m   BP Readings from Last 3 Encounters:  04/01/24 (!) 152/96  01/02/24 136/78  11/20/23 118/70    Wt Readings from Last 3 Encounters:  02/07/24 164 lb (74.4 kg)  01/02/24 164 lb (74.4 kg)  11/20/23 165 lb (74.8 kg)    Physical Exam Constitutional:      General: She is not in acute distress.    Appearance: She is well-developed.  HENT:     Head: Normocephalic.  Right Ear: External ear normal.     Left Ear: External ear normal.     Nose: Nose normal.  Eyes:     General:        Right eye: No discharge.        Left eye: No discharge.     Conjunctiva/sclera: Conjunctivae normal.     Pupils: Pupils are equal, round, and reactive to light.  Neck:     Thyroid : No thyromegaly.     Vascular: No JVD.     Trachea: No tracheal deviation.  Cardiovascular:     Rate and Rhythm: Normal rate and regular rhythm.     Heart sounds: Normal heart sounds.  Pulmonary:     Effort: No respiratory distress.     Breath sounds: No stridor. No wheezing.  Abdominal:     General: Bowel sounds are normal. There is no distension.     Palpations:  Abdomen is soft. There is no mass.     Tenderness: There is no abdominal tenderness. There is no guarding or rebound.  Musculoskeletal:        General: No tenderness.     Cervical back: Normal range of motion and neck supple. No rigidity.  Lymphadenopathy:     Cervical: No cervical adenopathy.  Skin:    Findings: No erythema or rash.  Neurological:     Cranial Nerves: No cranial nerve deficit.     Motor: No abnormal muscle tone.     Coordination: Coordination normal.     Deep Tendon Reflexes: Reflexes normal.  Psychiatric:        Behavior: Behavior normal.        Thought Content: Thought content normal.        Judgment: Judgment normal.     Lab Results  Component Value Date   WBC 11.0 (H) 03/31/2020   HGB 13.1 03/31/2020   HCT 40.0 03/31/2020   PLT 242 03/31/2020   GLUCOSE 96 11/20/2023   CHOL 165 03/20/2019   TRIG 148.0 03/20/2019   HDL 47.90 03/20/2019   LDLCALC 88 03/20/2019   ALT 13 11/20/2023   AST 18 11/20/2023   NA 138 11/20/2023   K 4.3 11/20/2023   CL 101 11/20/2023   CREATININE 0.97 11/20/2023   BUN 19 11/20/2023   CO2 32 11/20/2023   TSH 0.95 09/10/2019   INR 0.9 02/06/2018   HGBA1C 5.7 11/20/2023   MICROALBUR <0.7 11/20/2023    MM 3D SCREENING MAMMOGRAM BILATERAL BREAST Result Date: 12/01/2023 CLINICAL DATA:  Screening. EXAM: DIGITAL SCREENING BILATERAL MAMMOGRAM WITH TOMOSYNTHESIS AND CAD TECHNIQUE: Bilateral screening digital craniocaudal and mediolateral oblique mammograms were obtained. Bilateral screening digital breast tomosynthesis was performed. The images were evaluated with computer-aided detection. COMPARISON:  Previous exam(s). ACR Breast Density Category b: There are scattered areas of fibroglandular density. FINDINGS: There are no findings suspicious for malignancy. IMPRESSION: No mammographic evidence of malignancy. A result letter of this screening mammogram will be mailed directly to the patient. RECOMMENDATION: Screening mammogram in one  year. (Code:SM-B-01Y) BI-RADS CATEGORY  1: Negative. Electronically Signed   By: Inocente Ast M.D.   On: 12/01/2023 17:27    Assessment & Plan:   Problem List Items Addressed This Visit     Hypertension   Normal blood pressure.  Taking 1/2 tablet of each of her blood pressure medications      Type 2 diabetes mellitus in patient with obesity (HCC) - Primary   On Mounjaro  12.5 mg/wk       Relevant  Orders   Comprehensive metabolic panel with GFR   Hemoglobin A1c      No orders of the defined types were placed in this encounter.     Follow-up: Return in about 3 months (around 07/02/2024) for a follow-up visit.  Marolyn Noel, MD

## 2024-04-01 NOTE — Assessment & Plan Note (Addendum)
 On Mounjaro  12.5 mg/wk

## 2024-04-01 NOTE — Assessment & Plan Note (Signed)
 Normal blood pressure.  Taking 1/2 tablet of each of her blood pressure medications

## 2024-05-06 ENCOUNTER — Telehealth

## 2024-05-06 ENCOUNTER — Ambulatory Visit: Admitting: Orthopedic Surgery

## 2024-05-06 DIAGNOSIS — B9789 Other viral agents as the cause of diseases classified elsewhere: Secondary | ICD-10-CM | POA: Diagnosis not present

## 2024-05-06 DIAGNOSIS — J019 Acute sinusitis, unspecified: Secondary | ICD-10-CM

## 2024-05-06 MED ORDER — AZELASTINE HCL 0.1 % NA SOLN: 2.0000 | Freq: Two times a day (BID) | NASAL | 0 refills | Status: AC

## 2024-05-06 NOTE — Progress Notes (Signed)
 Virtual Visit Consent   BROGAN ENGLAND, you are scheduled for a virtual visit with a [facility] provider today. Just as with appointments in the office, your consent must be obtained to participate. Your consent will be active for this visit and any virtual visit you may have with one of our providers in the next 365 days. If you have a MyChart account, a copy of this consent can be sent to you electronically.  As this is a virtual visit, video technology does not allow for your provider to perform a traditional examination. This may limit your provider's ability to fully assess your condition. If your provider identifies any concerns that need to be evaluated in person or the need to arrange testing (such as labs, EKG, etc.), we will make arrangements to do so. Although advances in technology are sophisticated, we cannot ensure that it will always work on either your end or our end. If the connection with a video visit is poor, the visit may have to be switched to a telephone visit. With either a video or telephone visit, we are not always able to ensure that we have a secure connection.  By engaging in this virtual visit, you consent to the provision of healthcare and authorize for your insurance to be billed (if applicable) for the services provided during this visit. Depending on your insurance coverage, you may receive a charge related to this service.  I need to obtain your verbal consent now. Are you willing to proceed with your visit today? ANASIA AGRO has provided verbal consent on 05/06/2024 for a virtual visit (video or telephone). Jon CHRISTELLA Belt, NP  Date: 05/06/2024 12:06 PM   Virtual Visit via Video Note   I, Jon CHRISTELLA Belt, connected with  LYBERTI THRUSH  ([id number], [birth year]) on 05/06/24 at 12:00 PM EST by a video-enabled telemedicine application and verified that I am speaking with the correct person using two identifiers.  Location: Patient: Virtual Visit Location  Patient: Home Provider: Virtual Visit Location Provider: Home Office   I discussed the limitations of evaluation and management by telemedicine and the availability of in person appointments. The patient expressed understanding and agreed to proceed.    History of Present Illness: NOVELLE ADDAIR is a 63 y.o. who identifies as a female who was assigned female at birth, and is being seen today for sinus infection.   Whole head hurts, jaw to forehead B. Is sure she has a sinus infection. Is going out of town this weekend and wants to be better by then.   Stuffy nose since 05/02/24. Didn't test self but is certain she doesn't have covid.   No fever. Little nasal drainage she has is greenish yellow.   Taking tylenol  sinus for no relief. Also Afrin x 1 today. Has never tried saline irrigation.   No SOB. Coughing up same green yellow of nasal drainage.   Has hx of bronchitis is worried this could be bronchitis but not coughing much and nasal/sinus pressure is worse sx.     HPI: HPI  Problems:  Patient Active Problem List   Diagnosis Date Noted   Earache on right 02/07/2024   Bug bite 01/02/2024   Breast tenderness in female 12/21/2022   Hematuria 05/03/2022   Urinary frequency 05/03/2022   Schamberg's purpura 12/06/2021   S/P hysterectomy with oophorectomy 07/05/2021   Encounter for screening fecal occult blood testing 07/05/2021   Encounter for well woman exam with routine gynecological exam 07/05/2021  Reactive depression (situational) 04/13/2021   Grief 04/13/2021   Knee pain, left 04/13/2021   Acute bronchitis 01/06/2021   Type 2 diabetes mellitus in patient with obesity (HCC) 12/16/2020   Vertigo 11/11/2020   Status post surgery 03/31/2020   Status post laparoscopic hysterectomy 03/31/2020   Nausea 11/27/2019   Hyperglycemia 09/10/2019   Body mass index (BMI) 35.0-35.9, adult 08/22/2019   Endometrial polyp    Asthmatic bronchitis 01/30/2019   Acute infectious diarrhea  11/27/2018   Colon cancer screening 11/14/2018   Ganglion cyst of volar aspect of right wrist    Allergic rhinitis 06/12/2018   Panic attacks 02/06/2018   Elevated glucose 02/06/2018   Petechial rash 01/05/2018   Epistaxis 01/05/2018   Screening for colorectal cancer 10/24/2017   Encounter for gynecological examination with Papanicolaou smear of cervix 10/24/2017   Cough 06/12/2017   URI (upper respiratory infection) 06/12/2017   Anxiety 11/14/2016   UTI (urinary tract infection) 11/11/2015   Diarrhea 11/11/2015   GERD (gastroesophageal reflux disease) 10/23/2015   BP check 09/24/2015   Urge incontinence 08/19/2015   Stye external 07/15/2015   S/P cervical spinal fusion 06/05/2015   Acute sinusitis 05/20/2015   Fatigue 05/13/2015   Edema 04/01/2015   Chest pain 04/01/2015   Nonspecific abnormal electrocardiogram (ECG) (EKG) 04/01/2015   Bee sting-induced anaphylaxis 02/24/2015   Neck pain, bilateral posterior 12/31/2014   Lower back pain 12/31/2014   Back pain 10/08/2014   Yeast infection 05/07/2014   Obesity 05/07/2014   Night sweats 04/14/2014   Abdominal pain 11/25/2013   Hormone replacement therapy (HRT) 08/21/2013   Well adult exam 05/16/2013   Hypertension     Allergies:  Allergies  Allergen Reactions   Bee Venom Swelling   Biaxin [Clarithromycin] Swelling    Edema in joint Can take a Zpac ok   Amlodipine  Swelling    redness   Betadine [Povidone Iodine] Rash   Chloraprep One Step [Chlorhexidine  Gluconate] Rash   Duraprep [Antiseptic Products, Misc.] Rash   Keflex [Cephalexin] Rash   Medications:  Current Outpatient Medications:    albuterol  (VENTOLIN  HFA) 108 (90 Base) MCG/ACT inhaler, INHALE 2 PUFFS INTO THE LUNGS EVERY 6 HOURS AS NEEDED FOR WHEEZING OR SHORTNESS OF BREATH, Disp: 18 g, Rfl: 5   azelastine  (ASTELIN ) 0.1 % nasal spray, Place 2 sprays into both nostrils 2 (two) times daily. Use in each nostril as directed, Disp: 30 mL, Rfl: 0   azithromycin   (ZITHROMAX  Z-PAK) 250 MG tablet, As directed, Disp: 6 tablet, Rfl: 0   cyclobenzaprine  (FLEXERIL ) 10 MG tablet, Take 1 tablet (10 mg total) by mouth 3 (three) times daily as needed for muscle spasms., Disp: 30 tablet, Rfl: 1   diphenhydrAMINE  (BENADRYL ) 25 MG tablet, Take 2 tablets (50 mg total) by mouth every 4 (four) hours as needed (bee sting)., Disp: 30 tablet, Rfl: 3   EPINEPHrine  (EPIPEN  2-PAK) 0.3 mg/0.3 mL IJ SOAJ injection, Inject 0.3 mg into the muscle as needed for anaphylaxis., Disp: 1 each, Rfl: 2   hydrocortisone  2.5 % cream, Apply topically 2 (two) times daily., Disp: 30 g, Rfl: 1   ibuprofen  (ADVIL ) 600 MG tablet, Take tid x 2 days, then tid prn pain, Disp: 60 tablet, Rfl: 3   LORazepam  (ATIVAN ) 0.5 MG tablet, TAKE 1 TABLET(0.5 MG) BY MOUTH AT BEDTIME AS NEEDED, Disp: 90 tablet, Rfl: 1   metoprolol  succinate (TOPROL -XL) 50 MG 24 hr tablet, TAKE 1 TABLET BY MOUTH EVERY DAY WITH OR IMMEDIATELY FOLLOWING A MEAL, Disp: 90  tablet, Rfl: 3   olmesartan  (BENICAR ) 40 MG tablet, TAKE 1 TABLET(40 MG) BY MOUTH DAILY, Disp: 90 tablet, Rfl: 3   omeprazole  (PRILOSEC) 40 MG capsule, TAKE 1 CAPSULE(40 MG) BY MOUTH DAILY, Disp: 90 capsule, Rfl: 3   ondansetron  (ZOFRAN -ODT) 4 MG disintegrating tablet, Take 1 tablet (4 mg total) by mouth every 8 (eight) hours as needed for nausea or vomiting., Disp: 20 tablet, Rfl: 0   tirzepatide  (MOUNJARO ) 12.5 MG/0.5ML Pen, Inject 12.5 mg into the skin once a week., Disp: 6 mL, Rfl: 1   triamterene -hydrochlorothiazide  (MAXZIDE -25) 37.5-25 MG tablet, TAKE 1 TABLET BY MOUTH DAILY, Disp: 90 tablet, Rfl: 3   zinc gluconate 50 MG tablet, Take 50 mg by mouth., Disp: , Rfl:   Observations/Objective: Patient is well-developed, well-nourished in no acute distress.  Resting comfortably  at home.  Head is normocephalic, atraumatic.  No labored breathing.  Speech is clear and coherent with logical content.  Patient is alert and oriented at baseline.    Assessment and  Plan: 1. Acute viral sinusitis (Primary)  Most likely viral infection at this point. Discussed supportive care, guidelines for treating bacterial sinus infections.   Follow Up Instructions: I discussed the assessment and treatment plan with the patient. The patient was provided an opportunity to ask questions and all were answered. The patient agreed with the plan and demonstrated an understanding of the instructions.  A copy of instructions were sent to the patient via MyChart unless otherwise noted below.   The patient was advised to call back or seek an in-person evaluation if the symptoms worsen or if the condition fails to improve as anticipated.    Jon CHRISTELLA Belt, NP

## 2024-05-06 NOTE — Patient Instructions (Signed)
 Terry JINNY Music, thank you for joining Jon CHRISTELLA Belt, NP for today's virtual visit.  While this provider is not your primary care provider (PCP), if your PCP is located in our provider database this encounter information will be shared with them immediately following your visit.   A [facility] MyChart account gives you access to today's visit and all your visits, tests, and labs performed at [facility]  click here if you don't have a [facility] MyChart account or go to [email or web address]  Consent: (Patient) Caroline Wong provided verbal consent for this virtual visit at the beginning of the encounter.  Current Medications:  Current Outpatient Medications:    azelastine  (ASTELIN ) 0.1 % nasal spray, Place 2 sprays into both nostrils 2 (two) times daily. Use in each nostril as directed, Disp: 30 mL, Rfl: 0   albuterol  (VENTOLIN  HFA) 108 (90 Base) MCG/ACT inhaler, INHALE 2 PUFFS INTO THE LUNGS EVERY 6 HOURS AS NEEDED FOR WHEEZING OR SHORTNESS OF BREATH, Disp: 18 g, Rfl: 5   azithromycin  (ZITHROMAX  Z-PAK) 250 MG tablet, As directed, Disp: 6 tablet, Rfl: 0   cyclobenzaprine  (FLEXERIL ) 10 MG tablet, Take 1 tablet (10 mg total) by mouth 3 (three) times daily as needed for muscle spasms., Disp: 30 tablet, Rfl: 1   diphenhydrAMINE  (BENADRYL ) 25 MG tablet, Take 2 tablets (50 mg total) by mouth every 4 (four) hours as needed (bee sting)., Disp: 30 tablet, Rfl: 3   EPINEPHrine  (EPIPEN  2-PAK) 0.3 mg/0.3 mL IJ SOAJ injection, Inject 0.3 mg into the muscle as needed for anaphylaxis., Disp: 1 each, Rfl: 2   hydrocortisone  2.5 % cream, Apply topically 2 (two) times daily., Disp: 30 g, Rfl: 1   ibuprofen  (ADVIL ) 600 MG tablet, Take tid x 2 days, then tid prn pain, Disp: 60 tablet, Rfl: 3   LORazepam  (ATIVAN ) 0.5 MG tablet, TAKE 1 TABLET(0.5 MG) BY MOUTH AT BEDTIME AS NEEDED, Disp: 90 tablet, Rfl: 1   metoprolol  succinate (TOPROL -XL) 50 MG 24 hr tablet, TAKE 1 TABLET BY MOUTH EVERY DAY  WITH OR IMMEDIATELY FOLLOWING A MEAL, Disp: 90 tablet, Rfl: 3   olmesartan  (BENICAR ) 40 MG tablet, TAKE 1 TABLET(40 MG) BY MOUTH DAILY, Disp: 90 tablet, Rfl: 3   omeprazole  (PRILOSEC) 40 MG capsule, TAKE 1 CAPSULE(40 MG) BY MOUTH DAILY, Disp: 90 capsule, Rfl: 3   ondansetron  (ZOFRAN -ODT) 4 MG disintegrating tablet, Take 1 tablet (4 mg total) by mouth every 8 (eight) hours as needed for nausea or vomiting., Disp: 20 tablet, Rfl: 0   tirzepatide  (MOUNJARO ) 12.5 MG/0.5ML Pen, Inject 12.5 mg into the skin once a week., Disp: 6 mL, Rfl: 1   triamterene -hydrochlorothiazide  (MAXZIDE -25) 37.5-25 MG tablet, TAKE 1 TABLET BY MOUTH DAILY, Disp: 90 tablet, Rfl: 3   zinc gluconate 50 MG tablet, Take 50 mg by mouth., Disp: , Rfl:    Medications ordered in this encounter:  Meds ordered this encounter  Medications   azelastine  (ASTELIN ) 0.1 % nasal spray    Sig: Place 2 sprays into both nostrils 2 (two) times daily. Use in each nostril as directed    Dispense:  30 mL    Refill:  0     *If you need refills on other medications prior to your next appointment, please contact your pharmacy*  Follow-Up: Call back or seek an in-person evaluation if the symptoms worsen or if the condition fails to improve as anticipated.   Virtual Care [phone]  Other Instructions I recommend you try using saline irrigation,  such as with a neti pot, several times a day while you are sick. Many neti pots come with salt packets premeasured to use to make saline. If you use your own salt, make sure it is kosher salt or sea salt (don't use table salt as it has iodine in it and you don't need that in your nose). Use distilled water to make saline. If you mix your own saline using your own salt, the recipe is 1/4 teaspoon salt in 1 cup warm water. Using saline irrigation can help prevent and treat sinus infections.   If you are not able to do saline irrigation, try saline nasal spray.   It's ok to use Afrin for  congestion for 3 days only - see package instructions - after 3 days, it can make congestion worse instead of better.   Antibiotics are not recommended by the Infectious Disease Society of America unless you have severe symptoms (including high fever) or you have symptoms for more than 10 days. If you still have symptoms after 10 days, antibiotics should be considered.     If you have been instructed to have an in-person evaluation today at a local Urgent Care facility, please use the link below. It will take you to a list of all of our available [facility], including address, phone number and hours of operation. Please do not delay care.  [facility]  If you or a family member do not have a primary care provider, use the link below to schedule a visit and establish care. When you choose a [facility] primary care physician or advanced practice provider, you gain a long-term partner in health. Find a Primary Care Provider  Learn more about [facility]'s in-office and virtual care options: [facility] - Get Care Now

## 2024-05-08 ENCOUNTER — Encounter: Payer: Self-pay | Admitting: Emergency Medicine

## 2024-05-08 ENCOUNTER — Ambulatory Visit
Admission: EM | Admit: 2024-05-08 | Discharge: 2024-05-08 | Disposition: A | Discharge status: Home / Self Care | Attending: Family Medicine | Admitting: Family Medicine

## 2024-05-08 DIAGNOSIS — J069 Acute upper respiratory infection, unspecified: Secondary | ICD-10-CM | POA: Diagnosis not present

## 2024-05-08 DIAGNOSIS — R030 Elevated blood-pressure reading, without diagnosis of hypertension: Secondary | ICD-10-CM | POA: Diagnosis not present

## 2024-05-08 MED ORDER — PROMETHAZINE-DM 6.25-15 MG/5ML PO SYRP: 5.0000 mL | ORAL_SOLUTION | Freq: Four times a day (QID) | ORAL | 0 refills | Status: AC | PRN

## 2024-05-08 MED ORDER — AZELASTINE HCL 0.1 % NA SOLN: 1.0000 | Freq: Two times a day (BID) | NASAL | 0 refills | Status: AC

## 2024-05-08 NOTE — ED Provider Notes (Signed)
 [facility]    CSN: [id number] Arrival date & time: 05/08/24  0803      History   Chief Complaint No chief complaint on file.   HPI Caroline Wong is a 63 y.o. female.   Patient presenting today with 4-day history of hacking cough, rhinorrhea, sore throat, ear pain and pressure, sinus pressure.  Denies fever, chills, chest pain, shortness of breath, abdominal pain, vomiting, diarrhea.  So far trying Tylenol  cold and flu and Mucinex with minimal relief.    Past Medical History:  Diagnosis Date   Abdominal pain 11/25/2013   Has some nausea associated with pain that has been on and off x 2 weeks will get US    Anxiety    Takes Lorazepam    Cancer (HCC)    skin Ca. removed from chest   Chronic bronchitis (HCC)    Diabetes mellitus without complication (HCC)    Elevated serum creatinine    Endometrial polyp    Gallstones    GERD (gastroesophageal reflux disease)    Hormone replacement therapy (HRT)    Hypertension    Night sweats 04/14/2014   Obesity 05/07/2014   STD (sexually transmitted disease)    HSV ?carrier--never had an outbreak but exposed from ex-husband   Urge incontinence 08/19/2015   Vertigo    Yeast infection 05/07/2014    Patient Active Problem List   Diagnosis Date Noted   Earache on right 02/07/2024   Bug bite 01/02/2024   Breast tenderness in female 12/21/2022   Hematuria 05/03/2022   Urinary frequency 05/03/2022   Schamberg's purpura 12/06/2021   S/P hysterectomy with oophorectomy 07/05/2021   Encounter for screening fecal occult blood testing 07/05/2021   Encounter for well woman exam with routine gynecological exam 07/05/2021   Reactive depression (situational) 04/13/2021   Grief 04/13/2021   Knee pain, left 04/13/2021   Acute bronchitis 01/06/2021   Type 2 diabetes mellitus in patient with obesity (HCC) 12/16/2020   Vertigo 11/11/2020   Status post surgery 03/31/2020   Status post laparoscopic hysterectomy 03/31/2020    Nausea 11/27/2019   Hyperglycemia 09/10/2019   Body mass index (BMI) 35.0-35.9, adult 08/22/2019   Endometrial polyp    Asthmatic bronchitis 01/30/2019   Acute infectious diarrhea 11/27/2018   Colon cancer screening 11/14/2018   Ganglion cyst of volar aspect of right wrist    Allergic rhinitis 06/12/2018   Panic attacks 02/06/2018   Elevated glucose 02/06/2018   Petechial rash 01/05/2018   Epistaxis 01/05/2018   Screening for colorectal cancer 10/24/2017   Encounter for gynecological examination with Papanicolaou smear of cervix 10/24/2017   Cough 06/12/2017   URI (upper respiratory infection) 06/12/2017   Anxiety 11/14/2016   UTI (urinary tract infection) 11/11/2015   Diarrhea 11/11/2015   GERD (gastroesophageal reflux disease) 10/23/2015   BP check 09/24/2015   Urge incontinence 08/19/2015   Stye external 07/15/2015   S/P cervical spinal fusion 06/05/2015   Acute sinusitis 05/20/2015   Fatigue 05/13/2015   Edema 04/01/2015   Chest pain 04/01/2015   Nonspecific abnormal electrocardiogram (ECG) (EKG) 04/01/2015   Bee sting-induced anaphylaxis 02/24/2015   Neck pain, bilateral posterior 12/31/2014   Lower back pain 12/31/2014   Back pain 10/08/2014   Yeast infection 05/07/2014   Obesity 05/07/2014   Night sweats 04/14/2014   Abdominal pain 11/25/2013   Hormone replacement therapy (HRT) 08/21/2013   Well adult exam 05/16/2013   Hypertension     Past Surgical History:  Procedure Laterality Date   ABDOMINAL  HYSTERECTOMY     complete   ANTERIOR CERVICAL DECOMP/DISCECTOMY FUSION N/A 06/05/2015   Procedure: C5-6, C6-7 Anterior Cervical Discectomy and Fusion, Allograft, Plate;  Surgeon: Oneil JAYSON Herald, MD;  Location: [facility];  Service: Orthopedics;  Laterality: N/A;   ANTERIOR FUSION CERVICAL SPINE  06/05/2015   C 5 6 7     CHOLECYSTECTOMY     COLONOSCOPY  2015   NO POLYPS   CYSTOSCOPY N/A 03/31/2020   Procedure: CYSTOSCOPY;  Surgeon: Cathlyn JAYSON Nikki Bobie FORBES, MD;   Location: [facility];  Service: Gynecology;  Laterality: N/A;   FOOT SURGERY Left    GANGLION CYST EXCISION Right 07/09/2018   Procedure: EXCISION, RIGHT VOLAR WRIST GANGLION;  Surgeon: Herald Oneil JAYSON, MD;  Location: [facility];  Service: Orthopedics;  Laterality: Right;   MOLE REMOVAL     TOTAL LAPAROSCOPIC HYSTERECTOMY WITH BILATERAL SALPINGO OOPHORECTOMY N/A 03/31/2020   Procedure: TOTAL LAPAROSCOPIC HYSTERECTOMY WITH BILATERAL SALPINGECTOMY POSSIBLE BILATERAL OOPHORECTOMY;  Surgeon: Cathlyn JAYSON Nikki Bobie FORBES, MD;  Location: [facility];  Service: Gynecology;  Laterality: N/A;  PLEASE have [employer]are as prep solution.  Patient is allergic to Betadine, CHG, and Duraprep.   WISDOM TOOTH EXTRACTION      OB History     Gravida  2   Para  2   Term  2   Preterm      AB      Living  2      SAB      IAB      Ectopic      Multiple      Live Births  2            Home Medications    Prior to Admission medications   Medication Sig Start Date End Date Taking? Authorizing Provider  azelastine (ASTELIN) 0.1 % nasal spray Place 1 spray into both nostrils 2 (two) times daily. Use in each nostril as directed 05/08/24  Yes Stuart Vernell Norris, PA-C  promethazine -dextromethorphan (PROMETHAZINE -DM) 6.25-15 MG/5ML syrup Take 5 mLs by mouth 4 (four) times daily as needed. 05/08/24  Yes Stuart Vernell Norris, PA-C  albuterol  (VENTOLIN  HFA) [phone] Base) MCG/ACT inhaler INHALE 2 PUFFS INTO THE LUNGS EVERY 6 HOURS AS NEEDED FOR WHEEZING OR SHORTNESS OF BREATH 09/20/23   Webb, Padonda B, FNP  azelastine (ASTELIN) 0.1 % nasal spray Place 2 sprays into both nostrils 2 (two) times daily. Use in each nostril as directed 05/06/24   Richad Jon HERO, NP  azithromycin  (ZITHROMAX  Z-PAK) 250 MG tablet As directed 02/07/24   Plotnikov, Aleksei V, MD  cyclobenzaprine  (FLEXERIL ) 10 MG tablet Take 1 tablet (10 mg total) by mouth 3 (three) times daily as needed  for muscle spasms. 11/20/23   Plotnikov, Karlynn GAILS, MD  diphenhydrAMINE  (BENADRYL ) 25 MG tablet Take 2 tablets (50 mg total) by mouth every 4 (four) hours as needed (bee sting). 02/24/15   Plotnikov, Karlynn GAILS, MD  EPINEPHrine  (EPIPEN  2-PAK) 0.3 mg/0.3 mL IJ SOAJ injection Inject 0.3 mg into the muscle as needed for anaphylaxis. 12/23/22   Plotnikov, Aleksei V, MD  hydrocortisone  2.5 % cream Apply topically 2 (two) times daily. 01/02/24   Signa Delon LABOR, NP  ibuprofen  (ADVIL ) 600 MG tablet Take tid x 2 days, then tid prn pain 02/07/24   Plotnikov, Aleksei V, MD  LORazepam  (ATIVAN ) 0.5 MG tablet TAKE 1 TABLET(0.5 MG) BY MOUTH AT BEDTIME AS NEEDED 02/05/24   Plotnikov, Karlynn GAILS, MD  metoprolol   succinate (TOPROL -XL) 50 MG 24 hr tablet TAKE 1 TABLET BY MOUTH EVERY DAY WITH OR IMMEDIATELY FOLLOWING A MEAL 12/25/23   Plotnikov, Aleksei V, MD  olmesartan  (BENICAR ) 40 MG tablet TAKE 1 TABLET(40 MG) BY MOUTH DAILY 12/25/23   Plotnikov, Aleksei V, MD  omeprazole  (PRILOSEC) 40 MG capsule TAKE 1 CAPSULE(40 MG) BY MOUTH DAILY 09/14/23   Webb, Padonda B, FNP  ondansetron  (ZOFRAN -ODT) 4 MG disintegrating tablet Take 1 tablet (4 mg total) by mouth every 8 (eight) hours as needed for nausea or vomiting. 10/16/23   Alvia Corean CROME, FNP  tirzepatide  (MOUNJARO ) 12.5 MG/0.5ML Pen Inject 12.5 mg into the skin once a week. 12/10/23   Plotnikov, Aleksei V, MD  triamterene -hydrochlorothiazide  (MAXZIDE -25) 37.5-25 MG tablet TAKE 1 TABLET BY MOUTH DAILY 12/25/23   Plotnikov, Aleksei V, MD  zinc gluconate 50 MG tablet Take 50 mg by mouth.    [provider]    Family History Family History  Problem Relation Age of Onset   Diabetes Paternal Grandfather    Heart disease Paternal Grandfather    Colon cancer Paternal Grandfather        mets   Arthritis Paternal Grandfather    Stomach cancer Paternal Grandfather        mets to stomach   Breast cancer Paternal Grandmother    Heart disease Paternal Grandmother     Arthritis Paternal Grandmother    Stroke Paternal Grandmother    Ovarian cancer Paternal Grandmother    Heart disease Maternal Grandmother    Diabetes Maternal Grandmother    Asthma Maternal Grandmother    Arthritis Maternal Grandmother    Heart disease Maternal Grandfather    Arthritis Maternal Grandfather    Cancer Maternal Grandfather        bladder   Heart disease Father    Emphysema Father    Breast cancer Mother 36       breast   Diabetes Mother    Fibromyalgia Mother    Heart disease Mother        CHF   Hypertension Mother    Kidney disease Mother    Other Mother        COVID   Diabetes Brother    Hypertension Brother        x 2   Hypertension Brother    Cancer Sister        ovarian   Breast cancer Maternal Aunt    Breast cancer Paternal Aunt    Esophageal cancer Neg Hx    Pancreatic cancer Neg Hx    Rectal cancer Neg Hx     Social History Social History   Tobacco Use   Smoking status: Never   Smokeless tobacco: Never  Vaping Use   Vaping status: Never Used  Substance Use Topics   Alcohol use: Yes    Alcohol/week: 0.0 standard drinks of alcohol    Comment: rarely   Drug use: No     Allergies   Bee venom; Biaxin [clarithromycin]; Amlodipine ; Betadine [povidone iodine]; Chloraprep one step [chlorhexidine  gluconate]; Duraprep [antiseptic products, misc.]; and Keflex [cephalexin]   Review of Systems Review of Systems Per HPI  Physical Exam Triage Vital Signs ED Triage Vitals  Encounter Vitals Group     BP 05/08/24 0810 (!) 149/103     Girls Systolic BP Percentile --      Girls Diastolic BP Percentile --      Boys Systolic BP Percentile --      Boys Diastolic BP Percentile --  Pulse Rate 05/08/24 0810 72     Resp 05/08/24 0810 18     Temp 05/08/24 0810 98.4 F (36.9 C)     Temp Source 05/08/24 0810 Oral     SpO2 05/08/24 0810 97 %     Weight --      Height --      Head Circumference --      Peak Flow --      Pain Score 05/08/24 0812  0     Pain Loc --      Pain Education --      Exclude from Growth Chart --    No data found.  Updated Vital Signs BP (!) 149/103 (BP Location: Right Arm)   Pulse 72   Temp 98.4 F (36.9 C) (Oral)   Resp 18   LMP 02/15/2020 (Approximate)   SpO2 97%   Visual Acuity Right Eye Distance:   Left Eye Distance:   Bilateral Distance:    Right Eye Near:   Left Eye Near:    Bilateral Near:     Physical Exam Vitals and nursing note reviewed.  Constitutional:      Appearance: Normal appearance.  HENT:     Head: Atraumatic.     Right Ear: Tympanic membrane and external ear normal.     Left Ear: Tympanic membrane and external ear normal.     Nose: Rhinorrhea present.     Mouth/Throat:     Mouth: Mucous membranes are moist.     Pharynx: Posterior oropharyngeal erythema present.  Eyes:     Extraocular Movements: Extraocular movements intact.     Conjunctiva/sclera: Conjunctivae normal.  Cardiovascular:     Rate and Rhythm: Normal rate and regular rhythm.     Heart sounds: Normal heart sounds.  Pulmonary:     Effort: Pulmonary effort is normal.     Breath sounds: Normal breath sounds. No wheezing or rales.  Musculoskeletal:        General: Normal range of motion.     Cervical back: Normal range of motion and neck supple.  Skin:    General: Skin is warm and dry.  Neurological:     Mental Status: She is alert and oriented to person, place, and time.  Psychiatric:        Mood and Affect: Mood normal.        Thought Content: Thought content normal.      UC Treatments / Results  Labs (all labs ordered are listed, but only abnormal results are displayed) Labs Reviewed - No data to display  EKG   Radiology No results found.  Procedures Procedures (including critical care time)  Medications Ordered in UC Medications - No data to display  Initial Impression / Assessment and Plan / UC Course  I have reviewed the triage vital signs and the nursing notes.  Pertinent  labs & imaging results that were available during my care of the patient were reviewed by me and considered in my medical decision making (see chart for details).     Vital signs and exam very reassuring today, suspect viral upper respiratory infection.  Will treat with Phenergan  DM, Astelin, supportive over-the-counter medications and home care.  Blood pressure was elevated today, likely secondary to over-the-counter cold and congestion medications.  List given of safe medication options that will not further elevate blood pressure.  Return for worsening or unresolving symptoms.  Final Clinical Impressions(s) / UC Diagnoses   Final diagnoses:  Viral URI with cough  Elevated blood pressure reading     Discharge Instructions      I have prescribed a cough syrup and spray to help additionally with your symptoms.  Your blood pressure is significantly elevated today, likely due to the over-the-counter cold and congestion medications.  I recommend sticking with Coricidin HBP and plain Mucinex as these do not tend to elevate blood pressure like the typical over-the-counter cold and congestion medications.  In addition to all of this, nasal saline, humidifiers, vapor rubs as needed.  Follow-up for significantly worsening symptoms.    ED Prescriptions     Medication Sig Dispense Auth. Provider   promethazine -dextromethorphan (PROMETHAZINE -DM) 6.25-15 MG/5ML syrup Take 5 mLs by mouth 4 (four) times daily as needed. 100 mL Stuart Vernell Norris, PA-C   azelastine  (ASTELIN ) 0.1 % nasal spray Place 1 spray into both nostrils 2 (two) times daily. Use in each nostril as directed 30 mL Stuart Vernell Norris, PA-C      PDMP not reviewed this encounter.   Stuart Vernell Norris, [place] 05/08/24 1252

## 2024-05-08 NOTE — ED Triage Notes (Signed)
 Cough, runny nose, sore throat, right ear pain, head congestion since Sunday.  Has been taking tylenol  cold and flu and mucinex with some relief.

## 2024-05-08 NOTE — Discharge Instructions (Signed)
 I have prescribed a cough syrup and spray to help additionally with your symptoms.  Your blood pressure is significantly elevated today, likely due to the over-the-counter cold and congestion medications.  I recommend sticking with Coricidin HBP and plain Mucinex as these do not tend to elevate blood pressure like the typical over-the-counter cold and congestion medications.  In addition to all of this, nasal saline, humidifiers, vapor rubs as needed.  Follow-up for significantly worsening symptoms.

## 2024-06-10 ENCOUNTER — Telehealth: Payer: Self-pay

## 2024-06-10 NOTE — Telephone Encounter (Signed)
 Attempted to reach patient concerning colonoscopy recall; unable to speak with patient;  left message and number to the office for patient to call back and schedule appts;

## 2024-06-28 ENCOUNTER — Telehealth: Payer: Self-pay

## 2024-06-28 NOTE — Telephone Encounter (Signed)
 Open in error

## 2024-07-08 ENCOUNTER — Other Ambulatory Visit: Payer: Self-pay | Admitting: Internal Medicine

## 2024-07-08 ENCOUNTER — Ambulatory Visit: Payer: Self-pay | Admitting: Internal Medicine

## 2024-08-19 ENCOUNTER — Ambulatory Visit: Payer: Self-pay | Admitting: Internal Medicine
